# Patient Record
Sex: Female | Born: 1935 | Race: White | Hispanic: No | Marital: Married | Smoking: Never smoker
Health system: Southern US, Community
[De-identification: ages and names within clinical notes are randomized; demographics above are authoritative.]

## PROBLEM LIST (undated history)

## (undated) DIAGNOSIS — I10 Essential (primary) hypertension: Secondary | ICD-10-CM

## (undated) DIAGNOSIS — M1990 Unspecified osteoarthritis, unspecified site: Secondary | ICD-10-CM

## (undated) DIAGNOSIS — R011 Cardiac murmur, unspecified: Secondary | ICD-10-CM

## (undated) DIAGNOSIS — L719 Rosacea, unspecified: Secondary | ICD-10-CM

## (undated) DIAGNOSIS — D751 Secondary polycythemia: Secondary | ICD-10-CM

## (undated) DIAGNOSIS — G459 Transient cerebral ischemic attack, unspecified: Secondary | ICD-10-CM

## (undated) DIAGNOSIS — G250 Essential tremor: Secondary | ICD-10-CM

## (undated) DIAGNOSIS — K5792 Diverticulitis of intestine, part unspecified, without perforation or abscess without bleeding: Secondary | ICD-10-CM

## (undated) DIAGNOSIS — C50919 Malignant neoplasm of unspecified site of unspecified female breast: Secondary | ICD-10-CM

## (undated) DIAGNOSIS — I2510 Atherosclerotic heart disease of native coronary artery without angina pectoris: Secondary | ICD-10-CM

## (undated) HISTORY — DX: Unspecified osteoarthritis, unspecified site: M19.90

## (undated) HISTORY — DX: Diverticulitis of intestine, part unspecified, without perforation or abscess without bleeding: K57.92

## (undated) HISTORY — DX: Atherosclerotic heart disease of native coronary artery without angina pectoris: I25.10

## (undated) HISTORY — DX: Transient cerebral ischemic attack, unspecified: G45.9

## (undated) HISTORY — DX: Rosacea, unspecified: L71.9

## (undated) HISTORY — DX: Secondary polycythemia: D75.1

## (undated) HISTORY — DX: Malignant neoplasm of unspecified site of unspecified female breast: C50.919

## (undated) HISTORY — DX: Cardiac murmur, unspecified: R01.1

## (undated) HISTORY — DX: Essential tremor: G25.0

## (undated) HISTORY — DX: Essential (primary) hypertension: I10

---

## 1998-03-02 ENCOUNTER — Ambulatory Visit (HOSPITAL_COMMUNITY): Admission: RE | Admit: 1998-03-02 | Discharge: 1998-03-02 | Payer: Self-pay | Admitting: Neurosurgery

## 1998-03-19 ENCOUNTER — Ambulatory Visit (HOSPITAL_BASED_OUTPATIENT_CLINIC_OR_DEPARTMENT_OTHER): Admission: RE | Admit: 1998-03-19 | Discharge: 1998-03-19 | Payer: Self-pay | Admitting: Orthopaedic Surgery

## 1998-04-13 ENCOUNTER — Encounter: Admission: RE | Admit: 1998-04-13 | Discharge: 1998-07-12 | Payer: Self-pay | Admitting: Orthopaedic Surgery

## 1998-07-15 ENCOUNTER — Encounter: Admission: RE | Admit: 1998-07-15 | Discharge: 1998-10-13 | Payer: Self-pay

## 1998-10-19 ENCOUNTER — Encounter: Payer: Self-pay | Admitting: Neurosurgery

## 1998-10-21 ENCOUNTER — Ambulatory Visit (HOSPITAL_COMMUNITY): Admission: RE | Admit: 1998-10-21 | Discharge: 1998-10-21 | Payer: Self-pay | Admitting: Neurosurgery

## 1999-12-22 ENCOUNTER — Encounter: Admission: RE | Admit: 1999-12-22 | Discharge: 2000-03-21 | Payer: Self-pay | Admitting: Cardiology

## 2000-02-24 ENCOUNTER — Ambulatory Visit (HOSPITAL_BASED_OUTPATIENT_CLINIC_OR_DEPARTMENT_OTHER): Admission: RE | Admit: 2000-02-24 | Discharge: 2000-02-24 | Payer: Self-pay | Admitting: Orthopedic Surgery

## 2000-10-18 ENCOUNTER — Other Ambulatory Visit: Admission: RE | Admit: 2000-10-18 | Discharge: 2000-10-18 | Payer: Self-pay | Admitting: Radiology

## 2000-11-02 ENCOUNTER — Encounter (INDEPENDENT_AMBULATORY_CARE_PROVIDER_SITE_OTHER): Payer: Self-pay | Admitting: *Deleted

## 2000-11-02 ENCOUNTER — Encounter: Payer: Self-pay | Admitting: General Surgery

## 2000-11-02 ENCOUNTER — Ambulatory Visit (HOSPITAL_COMMUNITY): Admission: RE | Admit: 2000-11-02 | Discharge: 2000-11-03 | Payer: Self-pay | Admitting: General Surgery

## 2000-11-02 HISTORY — PX: MASTECTOMY PARTIAL / LUMPECTOMY: SUR851

## 2000-11-13 ENCOUNTER — Encounter: Admission: RE | Admit: 2000-11-13 | Discharge: 2001-02-11 | Payer: Self-pay | Admitting: Radiation Oncology

## 2000-12-13 ENCOUNTER — Ambulatory Visit (HOSPITAL_COMMUNITY): Admission: RE | Admit: 2000-12-13 | Discharge: 2000-12-13 | Payer: Self-pay | Admitting: Endocrinology

## 2000-12-13 ENCOUNTER — Encounter: Payer: Self-pay | Admitting: Endocrinology

## 2001-02-12 ENCOUNTER — Encounter: Admission: RE | Admit: 2001-02-12 | Discharge: 2001-05-13 | Payer: Self-pay | Admitting: Radiation Oncology

## 2001-06-20 ENCOUNTER — Ambulatory Visit (HOSPITAL_COMMUNITY): Admission: RE | Admit: 2001-06-20 | Discharge: 2001-06-20 | Payer: Self-pay | Admitting: Gastroenterology

## 2001-06-20 ENCOUNTER — Encounter (INDEPENDENT_AMBULATORY_CARE_PROVIDER_SITE_OTHER): Payer: Self-pay | Admitting: Specialist

## 2001-08-15 ENCOUNTER — Emergency Department (HOSPITAL_COMMUNITY): Admission: EM | Admit: 2001-08-15 | Discharge: 2001-08-15 | Payer: Self-pay

## 2001-10-15 ENCOUNTER — Encounter: Payer: Self-pay | Admitting: Orthopedic Surgery

## 2001-10-15 ENCOUNTER — Encounter: Admission: RE | Admit: 2001-10-15 | Discharge: 2001-10-15 | Payer: Self-pay | Admitting: Orthopedic Surgery

## 2001-10-16 ENCOUNTER — Ambulatory Visit (HOSPITAL_BASED_OUTPATIENT_CLINIC_OR_DEPARTMENT_OTHER): Admission: RE | Admit: 2001-10-16 | Discharge: 2001-10-16 | Payer: Self-pay | Admitting: Orthopedic Surgery

## 2001-10-16 ENCOUNTER — Encounter (INDEPENDENT_AMBULATORY_CARE_PROVIDER_SITE_OTHER): Payer: Self-pay | Admitting: Specialist

## 2003-07-09 ENCOUNTER — Encounter (HOSPITAL_BASED_OUTPATIENT_CLINIC_OR_DEPARTMENT_OTHER): Admission: RE | Admit: 2003-07-09 | Discharge: 2003-10-07 | Payer: Self-pay | Admitting: Internal Medicine

## 2003-12-25 ENCOUNTER — Encounter (HOSPITAL_BASED_OUTPATIENT_CLINIC_OR_DEPARTMENT_OTHER): Admission: RE | Admit: 2003-12-25 | Discharge: 2004-03-24 | Payer: Self-pay | Admitting: Internal Medicine

## 2003-12-31 ENCOUNTER — Ambulatory Visit (HOSPITAL_COMMUNITY): Admission: RE | Admit: 2003-12-31 | Discharge: 2003-12-31 | Payer: Self-pay | Admitting: *Deleted

## 2004-01-26 ENCOUNTER — Ambulatory Visit: Admission: EM | Admit: 2004-01-26 | Discharge: 2004-01-26 | Payer: Self-pay | Admitting: Orthopedic Surgery

## 2004-05-26 ENCOUNTER — Encounter (HOSPITAL_BASED_OUTPATIENT_CLINIC_OR_DEPARTMENT_OTHER): Admission: RE | Admit: 2004-05-26 | Discharge: 2004-08-24 | Payer: Self-pay | Admitting: Internal Medicine

## 2004-07-12 ENCOUNTER — Ambulatory Visit (HOSPITAL_COMMUNITY): Admission: RE | Admit: 2004-07-12 | Discharge: 2004-07-12 | Payer: Self-pay | Admitting: Gastroenterology

## 2004-07-12 ENCOUNTER — Encounter (INDEPENDENT_AMBULATORY_CARE_PROVIDER_SITE_OTHER): Payer: Self-pay | Admitting: Specialist

## 2004-09-01 ENCOUNTER — Encounter (HOSPITAL_BASED_OUTPATIENT_CLINIC_OR_DEPARTMENT_OTHER): Admission: RE | Admit: 2004-09-01 | Discharge: 2004-09-14 | Payer: Self-pay | Admitting: Internal Medicine

## 2004-11-18 ENCOUNTER — Encounter: Admission: RE | Admit: 2004-11-18 | Discharge: 2004-11-18 | Payer: Self-pay | Admitting: Orthopedic Surgery

## 2004-11-30 ENCOUNTER — Encounter (HOSPITAL_BASED_OUTPATIENT_CLINIC_OR_DEPARTMENT_OTHER): Admission: RE | Admit: 2004-11-30 | Discharge: 2004-12-20 | Payer: Self-pay | Admitting: Internal Medicine

## 2005-01-26 ENCOUNTER — Ambulatory Visit: Payer: Self-pay | Admitting: Oncology

## 2005-02-24 ENCOUNTER — Inpatient Hospital Stay (HOSPITAL_BASED_OUTPATIENT_CLINIC_OR_DEPARTMENT_OTHER): Admission: RE | Admit: 2005-02-24 | Discharge: 2005-02-24 | Payer: Self-pay | Admitting: *Deleted

## 2005-03-08 ENCOUNTER — Encounter (HOSPITAL_BASED_OUTPATIENT_CLINIC_OR_DEPARTMENT_OTHER): Admission: RE | Admit: 2005-03-08 | Discharge: 2005-06-06 | Payer: Self-pay | Admitting: Surgery

## 2005-05-17 ENCOUNTER — Observation Stay (HOSPITAL_COMMUNITY): Admission: EM | Admit: 2005-05-17 | Discharge: 2005-05-18 | Payer: Self-pay | Admitting: Emergency Medicine

## 2006-01-31 ENCOUNTER — Ambulatory Visit: Payer: Self-pay | Admitting: Oncology

## 2006-06-08 ENCOUNTER — Encounter: Admission: RE | Admit: 2006-06-08 | Discharge: 2006-06-08 | Payer: Self-pay | Admitting: Radiology

## 2006-09-01 ENCOUNTER — Ambulatory Visit (HOSPITAL_COMMUNITY): Admission: RE | Admit: 2006-09-01 | Discharge: 2006-09-01 | Payer: Self-pay | Admitting: Oncology

## 2006-12-14 ENCOUNTER — Ambulatory Visit (HOSPITAL_COMMUNITY): Admission: RE | Admit: 2006-12-14 | Discharge: 2006-12-14 | Payer: Self-pay | Admitting: Cardiology

## 2006-12-26 ENCOUNTER — Ambulatory Visit (HOSPITAL_COMMUNITY): Admission: RE | Admit: 2006-12-26 | Discharge: 2006-12-26 | Payer: Self-pay | Admitting: Cardiology

## 2007-01-24 ENCOUNTER — Ambulatory Visit: Payer: Self-pay | Admitting: Oncology

## 2007-01-26 ENCOUNTER — Ambulatory Visit: Payer: Self-pay | Admitting: Vascular Surgery

## 2007-01-26 LAB — CBC WITH DIFFERENTIAL/PLATELET
Basophils Absolute: 0.1 10*3/uL (ref 0.0–0.1)
Eosinophils Absolute: 0.2 10*3/uL (ref 0.0–0.5)
HCT: 43.5 % (ref 34.8–46.6)
HGB: 14.7 g/dL (ref 11.6–15.9)
MONO#: 0.7 10*3/uL (ref 0.1–0.9)
NEUT#: 2.9 10*3/uL (ref 1.5–6.5)
NEUT%: 54.2 % (ref 39.6–76.8)
RDW: 13 % (ref 11.3–14.5)
WBC: 5.4 10*3/uL (ref 3.9–10.0)
lymph#: 1.5 10*3/uL (ref 0.9–3.3)

## 2007-01-26 LAB — COMPREHENSIVE METABOLIC PANEL
ALT: 11 U/L (ref 0–35)
AST: 13 U/L (ref 0–37)
Albumin: 4.1 g/dL (ref 3.5–5.2)
BUN: 21 mg/dL (ref 6–23)
CO2: 23 mEq/L (ref 19–32)
Calcium: 8.8 mg/dL (ref 8.4–10.5)
Chloride: 109 mEq/L (ref 96–112)
Creatinine, Ser: 1.04 mg/dL (ref 0.40–1.20)
Potassium: 4.2 mEq/L (ref 3.5–5.3)

## 2007-01-26 LAB — LACTATE DEHYDROGENASE: LDH: 167 U/L (ref 94–250)

## 2007-09-06 ENCOUNTER — Encounter: Admission: RE | Admit: 2007-09-06 | Discharge: 2007-09-06 | Payer: Self-pay | Admitting: Orthopedic Surgery

## 2007-09-11 ENCOUNTER — Observation Stay (HOSPITAL_COMMUNITY): Admission: EM | Admit: 2007-09-11 | Discharge: 2007-09-12 | Payer: Self-pay | Admitting: Emergency Medicine

## 2007-11-30 ENCOUNTER — Encounter (INDEPENDENT_AMBULATORY_CARE_PROVIDER_SITE_OTHER): Payer: Self-pay | Admitting: *Deleted

## 2007-11-30 ENCOUNTER — Inpatient Hospital Stay (HOSPITAL_COMMUNITY): Admission: EM | Admit: 2007-11-30 | Discharge: 2007-12-02 | Payer: Self-pay | Admitting: Emergency Medicine

## 2007-11-30 ENCOUNTER — Ambulatory Visit: Payer: Self-pay | Admitting: Cardiology

## 2008-01-18 ENCOUNTER — Ambulatory Visit: Payer: Self-pay | Admitting: Oncology

## 2008-01-22 LAB — COMPREHENSIVE METABOLIC PANEL
ALT: 15 U/L (ref 0–35)
BUN: 21 mg/dL (ref 6–23)
CO2: 27 mEq/L (ref 19–32)
Creatinine, Ser: 0.84 mg/dL (ref 0.40–1.20)
Glucose, Bld: 185 mg/dL — ABNORMAL HIGH (ref 70–99)
Total Bilirubin: 0.6 mg/dL (ref 0.3–1.2)

## 2008-01-22 LAB — CBC WITH DIFFERENTIAL/PLATELET
BASO%: 0.4 % (ref 0.0–2.0)
HCT: 43.8 % (ref 34.8–46.6)
LYMPH%: 30.4 % (ref 14.0–48.0)
MCHC: 34.9 g/dL (ref 32.0–36.0)
MONO#: 0.8 10*3/uL (ref 0.1–0.9)
NEUT%: 52.4 % (ref 39.6–76.8)
Platelets: 179 10*3/uL (ref 145–400)
WBC: 6.2 10*3/uL (ref 3.9–10.0)

## 2008-01-22 LAB — LACTATE DEHYDROGENASE: LDH: 190 U/L (ref 94–250)

## 2008-03-03 ENCOUNTER — Encounter: Admission: RE | Admit: 2008-03-03 | Discharge: 2008-03-03 | Payer: Self-pay | Admitting: Radiology

## 2008-03-13 ENCOUNTER — Encounter: Admission: RE | Admit: 2008-03-13 | Discharge: 2008-03-13 | Payer: Self-pay | Admitting: Radiology

## 2008-03-13 ENCOUNTER — Encounter (INDEPENDENT_AMBULATORY_CARE_PROVIDER_SITE_OTHER): Payer: Self-pay | Admitting: Diagnostic Radiology

## 2008-04-05 ENCOUNTER — Encounter: Admission: RE | Admit: 2008-04-05 | Discharge: 2008-04-05 | Payer: Self-pay | Admitting: Orthopedic Surgery

## 2008-04-24 ENCOUNTER — Encounter: Admission: RE | Admit: 2008-04-24 | Discharge: 2008-04-24 | Payer: Self-pay | Admitting: Neurosurgery

## 2008-04-25 ENCOUNTER — Encounter: Admission: RE | Admit: 2008-04-25 | Discharge: 2008-04-25 | Payer: Self-pay | Admitting: Neurosurgery

## 2008-05-15 ENCOUNTER — Encounter (INDEPENDENT_AMBULATORY_CARE_PROVIDER_SITE_OTHER): Payer: Self-pay | Admitting: Surgery

## 2008-05-15 ENCOUNTER — Ambulatory Visit (HOSPITAL_BASED_OUTPATIENT_CLINIC_OR_DEPARTMENT_OTHER): Admission: RE | Admit: 2008-05-15 | Discharge: 2008-05-15 | Payer: Self-pay | Admitting: Surgery

## 2008-05-15 HISTORY — PX: MASTECTOMY PARTIAL / LUMPECTOMY: SUR851

## 2008-07-11 ENCOUNTER — Ambulatory Visit: Payer: Self-pay | Admitting: Oncology

## 2008-07-15 LAB — CBC WITH DIFFERENTIAL/PLATELET
BASO%: 0.6 % (ref 0.0–2.0)
LYMPH%: 34 % (ref 14.0–48.0)
MCHC: 33.7 g/dL (ref 32.0–36.0)
MCV: 102.9 fL — ABNORMAL HIGH (ref 81.0–101.0)
MONO%: 12.6 % (ref 0.0–13.0)
Platelets: 171 10*3/uL (ref 145–400)
RBC: 4.48 10*6/uL (ref 3.70–5.32)

## 2008-07-15 LAB — COMPREHENSIVE METABOLIC PANEL
ALT: 23 U/L (ref 0–35)
Alkaline Phosphatase: 54 U/L (ref 39–117)
Sodium: 138 mEq/L (ref 135–145)
Total Bilirubin: 0.4 mg/dL (ref 0.3–1.2)
Total Protein: 6.7 g/dL (ref 6.0–8.3)

## 2008-07-16 ENCOUNTER — Inpatient Hospital Stay (HOSPITAL_COMMUNITY): Admission: RE | Admit: 2008-07-16 | Discharge: 2008-07-18 | Payer: Self-pay | Admitting: Neurosurgery

## 2008-08-28 ENCOUNTER — Ambulatory Visit: Payer: Self-pay | Admitting: Surgery

## 2008-10-01 ENCOUNTER — Ambulatory Visit: Payer: Self-pay | Admitting: Oncology

## 2008-10-03 LAB — CBC WITH DIFFERENTIAL/PLATELET
EOS%: 3.8 % (ref 0.0–7.0)
Eosinophils Absolute: 0.3 10*3/uL (ref 0.0–0.5)
MCV: 102.7 fL — ABNORMAL HIGH (ref 81.0–101.0)
MONO%: 12.3 % (ref 0.0–13.0)
NEUT#: 3.6 10*3/uL (ref 1.5–6.5)
RBC: 4.28 10*6/uL (ref 3.70–5.32)
RDW: 13.6 % (ref 11.3–14.5)
lymph#: 2.1 10*3/uL (ref 0.9–3.3)

## 2008-10-03 LAB — COMPREHENSIVE METABOLIC PANEL
AST: 17 U/L (ref 0–37)
Albumin: 4.1 g/dL (ref 3.5–5.2)
Alkaline Phosphatase: 50 U/L (ref 39–117)
Glucose, Bld: 88 mg/dL (ref 70–99)
Potassium: 4.3 mEq/L (ref 3.5–5.3)
Sodium: 141 mEq/L (ref 135–145)
Total Protein: 6.4 g/dL (ref 6.0–8.3)

## 2008-11-14 DIAGNOSIS — G459 Transient cerebral ischemic attack, unspecified: Secondary | ICD-10-CM

## 2008-11-14 HISTORY — DX: Transient cerebral ischemic attack, unspecified: G45.9

## 2009-01-02 ENCOUNTER — Ambulatory Visit: Payer: Self-pay | Admitting: Oncology

## 2009-01-06 LAB — CBC WITH DIFFERENTIAL/PLATELET
Basophils Absolute: 0 10*3/uL (ref 0.0–0.1)
Eosinophils Absolute: 0.3 10*3/uL (ref 0.0–0.5)
HCT: 45.7 % (ref 34.8–46.6)
HGB: 15.7 g/dL (ref 11.6–15.9)
MONO#: 0.7 10*3/uL (ref 0.1–0.9)
NEUT#: 3.3 10*3/uL (ref 1.5–6.5)
NEUT%: 50.7 % (ref 38.4–76.8)
RDW: 12.5 % (ref 11.2–14.5)
lymph#: 2.2 10*3/uL (ref 0.9–3.3)

## 2009-01-06 LAB — LACTATE DEHYDROGENASE: LDH: 185 U/L (ref 94–250)

## 2009-01-06 LAB — COMPREHENSIVE METABOLIC PANEL
AST: 15 U/L (ref 0–37)
Albumin: 4.3 g/dL (ref 3.5–5.2)
BUN: 22 mg/dL (ref 6–23)
CO2: 26 mEq/L (ref 19–32)
Calcium: 9.3 mg/dL (ref 8.4–10.5)
Chloride: 107 mEq/L (ref 96–112)
Glucose, Bld: 81 mg/dL (ref 70–99)
Potassium: 4.8 mEq/L (ref 3.5–5.3)

## 2009-04-01 ENCOUNTER — Ambulatory Visit: Payer: Self-pay | Admitting: Oncology

## 2009-04-09 ENCOUNTER — Inpatient Hospital Stay (HOSPITAL_COMMUNITY): Admission: EM | Admit: 2009-04-09 | Discharge: 2009-04-09 | Payer: Self-pay | Admitting: Emergency Medicine

## 2009-05-20 ENCOUNTER — Inpatient Hospital Stay (HOSPITAL_COMMUNITY): Admission: EM | Admit: 2009-05-20 | Discharge: 2009-05-22 | Payer: Self-pay | Admitting: Emergency Medicine

## 2009-05-21 ENCOUNTER — Encounter (INDEPENDENT_AMBULATORY_CARE_PROVIDER_SITE_OTHER): Payer: Self-pay | Admitting: Internal Medicine

## 2009-05-21 ENCOUNTER — Ambulatory Visit: Payer: Self-pay | Admitting: Surgery

## 2009-05-21 ENCOUNTER — Encounter (INDEPENDENT_AMBULATORY_CARE_PROVIDER_SITE_OTHER): Payer: Self-pay | Admitting: Emergency Medicine

## 2009-09-30 ENCOUNTER — Ambulatory Visit: Payer: Self-pay | Admitting: Oncology

## 2009-10-02 LAB — COMPREHENSIVE METABOLIC PANEL
BUN: 21 mg/dL (ref 6–23)
CO2: 31 mEq/L (ref 19–32)
Calcium: 9.6 mg/dL (ref 8.4–10.5)
Chloride: 103 mEq/L (ref 96–112)
Creatinine, Ser: 0.88 mg/dL (ref 0.40–1.20)
Total Bilirubin: 0.4 mg/dL (ref 0.3–1.2)

## 2009-10-02 LAB — CBC WITH DIFFERENTIAL/PLATELET
BASO%: 0.5 % (ref 0.0–2.0)
Basophils Absolute: 0 10*3/uL (ref 0.0–0.1)
EOS%: 4 % (ref 0.0–7.0)
HCT: 44.8 % (ref 34.8–46.6)
HGB: 15.3 g/dL (ref 11.6–15.9)
LYMPH%: 33.5 % (ref 14.0–49.7)
MCH: 35.6 pg — ABNORMAL HIGH (ref 25.1–34.0)
MCHC: 34.1 g/dL (ref 31.5–36.0)
MONO#: 0.7 10*3/uL (ref 0.1–0.9)
NEUT%: 50.4 % (ref 38.4–76.8)
Platelets: 163 10*3/uL (ref 145–400)
lymph#: 2.1 10*3/uL (ref 0.9–3.3)

## 2009-10-02 LAB — LACTATE DEHYDROGENASE: LDH: 178 U/L (ref 94–250)

## 2009-10-16 ENCOUNTER — Encounter: Admission: RE | Admit: 2009-10-16 | Discharge: 2009-10-16 | Payer: Self-pay | Admitting: Oncology

## 2009-12-16 ENCOUNTER — Ambulatory Visit: Payer: Self-pay | Admitting: Oncology

## 2009-12-18 LAB — CBC WITH DIFFERENTIAL/PLATELET
EOS%: 3.5 % (ref 0.0–7.0)
MCH: 34.4 pg — ABNORMAL HIGH (ref 25.1–34.0)
MCV: 100.6 fL (ref 79.5–101.0)
MONO%: 10 % (ref 0.0–14.0)
NEUT#: 4.5 10*3/uL (ref 1.5–6.5)
RBC: 4.49 10*6/uL (ref 3.70–5.45)
RDW: 12.2 % (ref 11.2–14.5)
lymph#: 1.7 10*3/uL (ref 0.9–3.3)

## 2009-12-18 LAB — COMPREHENSIVE METABOLIC PANEL
ALT: 14 U/L (ref 0–35)
AST: 19 U/L (ref 0–37)
Albumin: 3.9 g/dL (ref 3.5–5.2)
Alkaline Phosphatase: 66 U/L (ref 39–117)
Chloride: 105 mEq/L (ref 96–112)
Potassium: 4.8 mEq/L (ref 3.5–5.3)
Sodium: 139 mEq/L (ref 135–145)
Total Protein: 6.7 g/dL (ref 6.0–8.3)

## 2009-12-22 ENCOUNTER — Emergency Department (HOSPITAL_COMMUNITY): Admission: EM | Admit: 2009-12-22 | Discharge: 2009-12-22 | Payer: Self-pay | Admitting: Emergency Medicine

## 2010-01-15 ENCOUNTER — Encounter: Admission: RE | Admit: 2010-01-15 | Discharge: 2010-01-15 | Payer: Self-pay | Admitting: Surgery

## 2010-01-19 ENCOUNTER — Ambulatory Visit (HOSPITAL_BASED_OUTPATIENT_CLINIC_OR_DEPARTMENT_OTHER): Admission: RE | Admit: 2010-01-19 | Discharge: 2010-01-19 | Payer: Self-pay | Admitting: Surgery

## 2010-01-19 HISTORY — PX: BREAST LUMPECTOMY: SHX2

## 2010-02-10 ENCOUNTER — Ambulatory Visit: Admission: RE | Admit: 2010-02-10 | Discharge: 2010-04-26 | Payer: Self-pay | Admitting: Radiation Oncology

## 2010-03-31 ENCOUNTER — Encounter (INDEPENDENT_AMBULATORY_CARE_PROVIDER_SITE_OTHER): Payer: Self-pay | Admitting: Surgery

## 2010-03-31 ENCOUNTER — Inpatient Hospital Stay (HOSPITAL_COMMUNITY): Admission: RE | Admit: 2010-03-31 | Discharge: 2010-04-03 | Payer: Self-pay | Admitting: Surgery

## 2010-03-31 HISTORY — PX: MASTECTOMY: SHX3

## 2010-04-01 ENCOUNTER — Ambulatory Visit: Payer: Self-pay | Admitting: Oncology

## 2010-05-17 ENCOUNTER — Inpatient Hospital Stay (HOSPITAL_COMMUNITY)
Admission: EM | Admit: 2010-05-17 | Discharge: 2010-05-19 | Payer: Self-pay | Source: Home / Self Care | Admitting: Emergency Medicine

## 2010-05-18 ENCOUNTER — Ambulatory Visit: Payer: Self-pay | Admitting: Internal Medicine

## 2010-06-23 ENCOUNTER — Ambulatory Visit: Payer: Self-pay | Admitting: Oncology

## 2010-06-25 ENCOUNTER — Encounter (HOSPITAL_COMMUNITY): Admission: RE | Admit: 2010-06-25 | Discharge: 2010-06-25 | Payer: Self-pay | Admitting: Neurosurgery

## 2010-08-25 ENCOUNTER — Ambulatory Visit: Payer: Self-pay | Admitting: Oncology

## 2010-09-17 ENCOUNTER — Encounter: Admission: RE | Admit: 2010-09-17 | Discharge: 2010-09-17 | Payer: Self-pay | Admitting: Neurosurgery

## 2010-09-24 ENCOUNTER — Ambulatory Visit: Payer: Self-pay | Admitting: Oncology

## 2010-10-21 ENCOUNTER — Inpatient Hospital Stay (HOSPITAL_COMMUNITY)
Admission: EM | Admit: 2010-10-21 | Discharge: 2010-10-22 | Payer: Self-pay | Source: Home / Self Care | Attending: Cardiology | Admitting: Cardiology

## 2010-10-25 ENCOUNTER — Ambulatory Visit: Payer: Self-pay | Admitting: Cardiology

## 2010-12-04 ENCOUNTER — Encounter: Payer: Self-pay | Admitting: Neurosurgery

## 2010-12-05 ENCOUNTER — Encounter: Payer: Self-pay | Admitting: Neurosurgery

## 2010-12-05 ENCOUNTER — Encounter: Payer: Self-pay | Admitting: Oncology

## 2010-12-05 ENCOUNTER — Encounter: Payer: Self-pay | Admitting: Orthopedic Surgery

## 2011-01-24 LAB — BASIC METABOLIC PANEL
BUN: 17 mg/dL (ref 6–23)
CO2: 29 mEq/L (ref 19–32)
Chloride: 99 mEq/L (ref 96–112)
Creatinine, Ser: 0.7 mg/dL (ref 0.4–1.2)
GFR calc Af Amer: >60 mL/min (ref >60)
Glucose, Bld: 227 mg/dL — ABNORMAL HIGH (ref 70–99)

## 2011-01-24 LAB — CBC
HCT: 45.7 % (ref 36.0–46.0)
MCH: 32.9 pg (ref 26.0–34.0)
MCV: 97.4 fL (ref 78.0–100.0)
MCV: 97.7 fL (ref 78.0–100.0)
Platelets: 172 10*3/uL (ref 150–400)
RBC: 4.69 MIL/uL (ref 3.87–5.11)
RBC: 4.8 MIL/uL (ref 3.87–5.11)
WBC: 4.9 10*3/uL (ref 4.0–10.5)

## 2011-01-24 LAB — URINALYSIS, ROUTINE W REFLEX MICROSCOPIC
Bilirubin Urine: NEGATIVE
Glucose, UA: 500 mg/dL — AB
Hgb urine dipstick: NEGATIVE
Ketones, ur: NEGATIVE mg/dL
Protein, ur: NEGATIVE mg/dL

## 2011-01-24 LAB — DIFFERENTIAL
Eosinophils Absolute: 0.2 10*3/uL (ref 0.0–0.7)
Eosinophils Relative: 4 % (ref 0–5)
Lymphocytes Relative: 43 % (ref 12–46)
Lymphs Abs: 1.7 10*3/uL (ref 0.7–4.0)
Lymphs Abs: 2.1 10*3/uL (ref 0.7–4.0)
Monocytes Absolute: 0.5 10*3/uL (ref 0.1–1.0)
Monocytes Relative: 8 % (ref 3–12)
Neutrophils Relative %: 62 % (ref 43–77)

## 2011-01-24 LAB — MRSA PCR SCREENING: MRSA by PCR: NEGATIVE

## 2011-01-24 LAB — GLUCOSE, CAPILLARY
Glucose-Capillary: 104 mg/dL — ABNORMAL HIGH (ref 70–99)
Glucose-Capillary: 189 mg/dL — ABNORMAL HIGH (ref 70–99)
Glucose-Capillary: 64 mg/dL — ABNORMAL LOW (ref 70–99)

## 2011-01-24 LAB — LIPID PANEL
Cholesterol: 100 mg/dL (ref 0–200)
HDL: 55 mg/dL (ref >39)
LDL Cholesterol: 29 mg/dL (ref 0–99)
Total CHOL/HDL Ratio: 1.8 RATIO

## 2011-01-24 LAB — PROTIME-INR: INR: 1.02 (ref 0.00–1.49)

## 2011-01-24 LAB — CK TOTAL AND CKMB (NOT AT ARMC): Relative Index: 2.9 — ABNORMAL HIGH (ref 0.0–2.5)

## 2011-01-24 LAB — APTT: aPTT: 32 seconds (ref 24–37)

## 2011-01-24 LAB — TROPONIN I: Troponin I: 0.02 ng/mL (ref 0.00–0.06)

## 2011-01-24 LAB — HEPARIN LEVEL (UNFRACTIONATED): Heparin Unfractionated: 0.29 IU/mL — ABNORMAL LOW (ref 0.30–0.70)

## 2011-01-24 LAB — POCT CARDIAC MARKERS: Myoglobin, poc: 131 ng/mL (ref 12–200)

## 2011-01-30 LAB — CBC
HCT: 38.7 % (ref 36.0–46.0)
HCT: 41.6 % (ref 36.0–46.0)
Hemoglobin: 14 g/dL (ref 12.0–15.0)
MCH: 34 pg (ref 26.0–34.0)
MCHC: 33.1 g/dL (ref 30.0–36.0)
MCHC: 33.6 g/dL (ref 30.0–36.0)
MCHC: 33.7 g/dL (ref 30.0–36.0)
MCV: 100.2 fL — ABNORMAL HIGH (ref 78.0–100.0)
MCV: 100.7 fL — ABNORMAL HIGH (ref 78.0–100.0)
Platelets: 150 10*3/uL (ref 150–400)
Platelets: 152 10*3/uL (ref 150–400)
RDW: 14.1 % (ref 11.5–15.5)
RDW: 14.3 % (ref 11.5–15.5)
WBC: 5.4 10*3/uL (ref 4.0–10.5)
WBC: 5.7 10*3/uL (ref 4.0–10.5)

## 2011-01-30 LAB — DIFFERENTIAL
Basophils Absolute: 0.1 10*3/uL (ref 0.0–0.1)
Basophils Relative: 0 % (ref 0–1)
Basophils Relative: 1 % (ref 0–1)
Eosinophils Absolute: 0.2 10*3/uL (ref 0.0–0.7)
Eosinophils Absolute: 0.2 10*3/uL (ref 0.0–0.7)
Eosinophils Relative: 3 % (ref 0–5)
Eosinophils Relative: 4 % (ref 0–5)
Lymphocytes Relative: 34 % (ref 12–46)
Monocytes Absolute: 0.9 10*3/uL (ref 0.1–1.0)
Monocytes Relative: 13 % — ABNORMAL HIGH (ref 3–12)

## 2011-01-30 LAB — LIPID PANEL
Cholesterol: 95 mg/dL (ref 0–200)
LDL Cholesterol: 37 mg/dL (ref 0–99)
Total CHOL/HDL Ratio: 1.9 RATIO
Triglycerides: 43 mg/dL (ref <150)
VLDL: 9 mg/dL (ref 0–40)

## 2011-01-30 LAB — COMPREHENSIVE METABOLIC PANEL
AST: 26 U/L (ref 0–37)
CO2: 29 mEq/L (ref 19–32)
Chloride: 103 mEq/L (ref 96–112)
Creatinine, Ser: 0.84 mg/dL (ref 0.4–1.2)
GFR calc Af Amer: >60 mL/min (ref >60)
GFR calc non Af Amer: >60 mL/min (ref >60)
Glucose, Bld: 112 mg/dL — ABNORMAL HIGH (ref 70–99)
Total Bilirubin: 0.7 mg/dL (ref 0.3–1.2)

## 2011-01-30 LAB — POCT CARDIAC MARKERS
CKMB, poc: 1.6 ng/mL (ref 1.0–8.0)
Myoglobin, poc: 77.6 ng/mL (ref 12–200)
Troponin i, poc: <0.05 ng/mL (ref 0.00–0.09)

## 2011-01-30 LAB — POCT I-STAT, CHEM 8
BUN: 26 mg/dL — ABNORMAL HIGH (ref 6–23)
Chloride: 102 mEq/L (ref 96–112)
Creatinine, Ser: 1.1 mg/dL (ref 0.4–1.2)
Potassium: 3.9 mEq/L (ref 3.5–5.1)
Sodium: 139 mEq/L (ref 135–145)

## 2011-01-30 LAB — TROPONIN I: Troponin I: 0.02 ng/mL (ref 0.00–0.06)

## 2011-01-30 LAB — GLUCOSE, CAPILLARY
Glucose-Capillary: 118 mg/dL — ABNORMAL HIGH (ref 70–99)
Glucose-Capillary: 82 mg/dL (ref 70–99)
Glucose-Capillary: 99 mg/dL (ref 70–99)

## 2011-01-30 LAB — HEMOGLOBIN A1C: Hgb A1c MFr Bld: 7.2 % — ABNORMAL HIGH (ref <5.7)

## 2011-01-30 LAB — CARDIAC PANEL(CRET KIN+CKTOT+MB+TROPI)
Total CK: 97 U/L (ref 7–177)
Troponin I: 0.01 ng/mL (ref 0.00–0.06)

## 2011-01-30 LAB — CK TOTAL AND CKMB (NOT AT ARMC): Total CK: 105 U/L (ref 7–177)

## 2011-01-30 LAB — BASIC METABOLIC PANEL
BUN: 20 mg/dL (ref 6–23)
BUN: 22 mg/dL (ref 6–23)
Chloride: 104 mEq/L (ref 96–112)
Creatinine, Ser: 0.84 mg/dL (ref 0.4–1.2)
Creatinine, Ser: 0.99 mg/dL (ref 0.4–1.2)
GFR calc non Af Amer: >60 mL/min (ref >60)
Glucose, Bld: 83 mg/dL (ref 70–99)
Glucose, Bld: 99 mg/dL (ref 70–99)

## 2011-01-30 LAB — MRSA PCR SCREENING: MRSA by PCR: NEGATIVE

## 2011-01-30 LAB — PROTIME-INR: Prothrombin Time: 12.9 seconds (ref 11.6–15.2)

## 2011-01-31 LAB — GLUCOSE, CAPILLARY
Glucose-Capillary: 162 mg/dL — ABNORMAL HIGH (ref 70–99)
Glucose-Capillary: 164 mg/dL — ABNORMAL HIGH (ref 70–99)
Glucose-Capillary: 221 mg/dL — ABNORMAL HIGH (ref 70–99)
Glucose-Capillary: 223 mg/dL — ABNORMAL HIGH (ref 70–99)
Glucose-Capillary: 237 mg/dL — ABNORMAL HIGH (ref 70–99)

## 2011-01-31 LAB — CARDIAC PANEL(CRET KIN+CKTOT+MB+TROPI)
CK, MB: 2.3 ng/mL (ref 0.3–4.0)
Relative Index: 1.3 (ref 0.0–2.5)

## 2011-02-01 LAB — CBC
MCHC: 33.2 g/dL (ref 30.0–36.0)
Platelets: 183 10*3/uL (ref 150–400)
RDW: 13.1 % (ref 11.5–15.5)

## 2011-02-01 LAB — URINALYSIS, ROUTINE W REFLEX MICROSCOPIC
Bilirubin Urine: NEGATIVE
Nitrite: NEGATIVE
Specific Gravity, Urine: 1.028 (ref 1.005–1.030)
Urobilinogen, UA: 0.2 mg/dL (ref 0.0–1.0)
pH: 5.5 (ref 5.0–8.0)

## 2011-02-01 LAB — DIFFERENTIAL
Basophils Absolute: 0 10*3/uL (ref 0.0–0.1)
Basophils Relative: 0 % (ref 0–1)
Lymphocytes Relative: 17 % (ref 12–46)
Monocytes Absolute: 0.8 10*3/uL (ref 0.1–1.0)
Neutro Abs: 6.1 10*3/uL (ref 1.7–7.7)
Neutrophils Relative %: 71 % (ref 43–77)

## 2011-02-01 LAB — BASIC METABOLIC PANEL
BUN: 15 mg/dL (ref 6–23)
CO2: 31 mEq/L (ref 19–32)
Calcium: 8.9 mg/dL (ref 8.4–10.5)
Creatinine, Ser: 0.86 mg/dL (ref 0.4–1.2)
GFR calc non Af Amer: >60 mL/min (ref >60)
Glucose, Bld: 214 mg/dL — ABNORMAL HIGH (ref 70–99)
Sodium: 140 mEq/L (ref 135–145)

## 2011-02-01 LAB — URINE MICROSCOPIC-ADD ON

## 2011-02-03 LAB — D-DIMER, QUANTITATIVE: D-Dimer, Quant: 0.71 ug/mL-FEU — ABNORMAL HIGH (ref 0.00–0.48)

## 2011-02-03 LAB — COMPREHENSIVE METABOLIC PANEL
AST: 22 U/L (ref 0–37)
CO2: 29 mEq/L (ref 19–32)
Calcium: 9.5 mg/dL (ref 8.4–10.5)
Creatinine, Ser: 0.85 mg/dL (ref 0.4–1.2)
GFR calc Af Amer: >60 mL/min (ref >60)
GFR calc non Af Amer: >60 mL/min (ref >60)
Glucose, Bld: 175 mg/dL — ABNORMAL HIGH (ref 70–99)
Total Protein: 7.2 g/dL (ref 6.0–8.3)

## 2011-02-03 LAB — CBC
MCHC: 34.4 g/dL (ref 30.0–36.0)
MCV: 100.8 fL — ABNORMAL HIGH (ref 78.0–100.0)
RBC: 4.53 MIL/uL (ref 3.87–5.11)
RDW: 12 % (ref 11.5–15.5)

## 2011-02-03 LAB — PROTIME-INR
INR: 1.03 (ref 0.00–1.49)
Prothrombin Time: 13.4 seconds (ref 11.6–15.2)

## 2011-02-03 LAB — DIFFERENTIAL
Lymphocytes Relative: 29 % (ref 12–46)
Lymphs Abs: 1.7 10*3/uL (ref 0.7–4.0)
Neutrophils Relative %: 55 % (ref 43–77)

## 2011-02-03 LAB — BRAIN NATRIURETIC PEPTIDE: Pro B Natriuretic peptide (BNP): 126 pg/mL — ABNORMAL HIGH (ref 0.0–100.0)

## 2011-02-06 LAB — BASIC METABOLIC PANEL
BUN: 18 mg/dL (ref 6–23)
CO2: 33 mEq/L — ABNORMAL HIGH (ref 19–32)
Chloride: 102 mEq/L (ref 96–112)
Glucose, Bld: 54 mg/dL — ABNORMAL LOW (ref 70–99)
Potassium: 4.4 mEq/L (ref 3.5–5.1)

## 2011-02-06 LAB — GLUCOSE, CAPILLARY: Glucose-Capillary: 132 mg/dL — ABNORMAL HIGH (ref 70–99)

## 2011-02-20 LAB — POCT CARDIAC MARKERS
CKMB, poc: <1 ng/mL — ABNORMAL LOW (ref 1.0–8.0)
Myoglobin, poc: 82 ng/mL (ref 12–200)
Troponin i, poc: <0.05 ng/mL (ref 0.00–0.09)
Troponin i, poc: <0.05 ng/mL (ref 0.00–0.09)

## 2011-02-20 LAB — URINALYSIS, ROUTINE W REFLEX MICROSCOPIC
Bilirubin Urine: NEGATIVE
Hgb urine dipstick: NEGATIVE
Ketones, ur: NEGATIVE mg/dL
Protein, ur: NEGATIVE mg/dL
Urobilinogen, UA: 0.2 mg/dL (ref 0.0–1.0)

## 2011-02-20 LAB — COMPREHENSIVE METABOLIC PANEL
AST: 27 U/L (ref 0–37)
CO2: 27 mEq/L (ref 19–32)
Chloride: 104 mEq/L (ref 96–112)
Creatinine, Ser: 0.81 mg/dL (ref 0.4–1.2)
GFR calc Af Amer: >60 mL/min (ref >60)
GFR calc non Af Amer: >60 mL/min (ref >60)
Total Bilirubin: 0.6 mg/dL (ref 0.3–1.2)

## 2011-02-20 LAB — CBC
HCT: 50.7 % — ABNORMAL HIGH (ref 36.0–46.0)
Hemoglobin: 16.5 g/dL — ABNORMAL HIGH (ref 12.0–15.0)
MCHC: 34.4 g/dL (ref 30.0–36.0)
MCV: 103.4 fL — ABNORMAL HIGH (ref 78.0–100.0)
MCV: 103.7 fL — ABNORMAL HIGH (ref 78.0–100.0)
MCV: 103.8 fL — ABNORMAL HIGH (ref 78.0–100.0)
Platelets: 152 10*3/uL (ref 150–400)
RBC: 4.61 MIL/uL (ref 3.87–5.11)
RBC: 4.64 MIL/uL (ref 3.87–5.11)
RBC: 4.89 MIL/uL (ref 3.87–5.11)
RDW: 13.3 % (ref 11.5–15.5)
WBC: 6.7 10*3/uL (ref 4.0–10.5)
WBC: 6.7 10*3/uL (ref 4.0–10.5)

## 2011-02-20 LAB — BASIC METABOLIC PANEL
BUN: 12 mg/dL (ref 6–23)
CO2: 27 mEq/L (ref 19–32)
Calcium: 9.1 mg/dL (ref 8.4–10.5)
Calcium: 9.2 mg/dL (ref 8.4–10.5)
Chloride: 101 mEq/L (ref 96–112)
Creatinine, Ser: 0.87 mg/dL (ref 0.4–1.2)
Creatinine, Ser: 0.95 mg/dL (ref 0.4–1.2)
GFR calc Af Amer: >60 mL/min (ref >60)
GFR calc Af Amer: >60 mL/min (ref >60)
Glucose, Bld: 169 mg/dL — ABNORMAL HIGH (ref 70–99)
Sodium: 135 mEq/L (ref 135–145)

## 2011-02-20 LAB — LIPID PANEL
Cholesterol: 126 mg/dL (ref 0–200)
HDL: 66 mg/dL (ref >39)
LDL Cholesterol: 44 mg/dL (ref 0–99)
Total CHOL/HDL Ratio: 1.9 RATIO
Triglycerides: 81 mg/dL (ref <150)

## 2011-02-20 LAB — URINE MICROSCOPIC-ADD ON

## 2011-02-20 LAB — CARDIAC PANEL(CRET KIN+CKTOT+MB+TROPI)
CK, MB: 2.2 ng/mL (ref 0.3–4.0)
CK, MB: 2.7 ng/mL (ref 0.3–4.0)
Relative Index: 2.1 (ref 0.0–2.5)
Relative Index: INVALID (ref 0.0–2.5)
Total CK: 83 U/L (ref 7–177)
Troponin I: 0.01 ng/mL (ref 0.00–0.06)
Troponin I: 0.02 ng/mL (ref 0.00–0.06)

## 2011-02-20 LAB — GLUCOSE, CAPILLARY
Glucose-Capillary: 114 mg/dL — ABNORMAL HIGH (ref 70–99)
Glucose-Capillary: 142 mg/dL — ABNORMAL HIGH (ref 70–99)
Glucose-Capillary: 231 mg/dL — ABNORMAL HIGH (ref 70–99)

## 2011-02-20 LAB — HEMOGLOBIN A1C: Mean Plasma Glucose: 128 mg/dL

## 2011-02-22 LAB — CBC
HCT: 45.4 % (ref 36.0–46.0)
MCHC: 34.9 g/dL (ref 30.0–36.0)
MCV: 101.6 fL — ABNORMAL HIGH (ref 78.0–100.0)
Platelets: 146 10*3/uL — ABNORMAL LOW (ref 150–400)
RBC: 4.15 MIL/uL (ref 3.87–5.11)
RDW: 12.9 % (ref 11.5–15.5)
RDW: 13.1 % (ref 11.5–15.5)

## 2011-02-22 LAB — COMPREHENSIVE METABOLIC PANEL
AST: 18 U/L (ref 0–37)
Albumin: 3.6 g/dL (ref 3.5–5.2)
BUN: 11 mg/dL (ref 6–23)
Calcium: 9.4 mg/dL (ref 8.4–10.5)
Creatinine, Ser: 0.75 mg/dL (ref 0.4–1.2)
GFR calc Af Amer: >60 mL/min (ref >60)
Total Bilirubin: 0.5 mg/dL (ref 0.3–1.2)
Total Protein: 6.9 g/dL (ref 6.0–8.3)

## 2011-02-22 LAB — CK TOTAL AND CKMB (NOT AT ARMC)
CK, MB: 1.7 ng/mL (ref 0.3–4.0)
CK, MB: 1.7 ng/mL (ref 0.3–4.0)
CK, MB: 1.8 ng/mL (ref 0.3–4.0)
Relative Index: 1.8 (ref 0.0–2.5)
Relative Index: INVALID (ref 0.0–2.5)
Relative Index: INVALID (ref 0.0–2.5)
Total CK: 101 U/L (ref 7–177)
Total CK: 72 U/L (ref 7–177)

## 2011-02-22 LAB — DIFFERENTIAL
Basophils Absolute: 0 10*3/uL (ref 0.0–0.1)
Eosinophils Relative: 4 % (ref 0–5)
Lymphocytes Relative: 35 % (ref 12–46)
Lymphs Abs: 1.9 10*3/uL (ref 0.7–4.0)
Monocytes Absolute: 0.6 10*3/uL (ref 0.1–1.0)
Monocytes Relative: 12 % (ref 3–12)
Neutro Abs: 2.6 10*3/uL (ref 1.7–7.7)

## 2011-02-22 LAB — POCT CARDIAC MARKERS
CKMB, poc: <1 ng/mL — ABNORMAL LOW (ref 1.0–8.0)
Troponin i, poc: <0.05 ng/mL (ref 0.00–0.09)

## 2011-02-22 LAB — TROPONIN I: Troponin I: 0.03 ng/mL (ref 0.00–0.06)

## 2011-02-22 LAB — BASIC METABOLIC PANEL
CO2: 30 mEq/L (ref 19–32)
Calcium: 9 mg/dL (ref 8.4–10.5)
Creatinine, Ser: 0.88 mg/dL (ref 0.4–1.2)
GFR calc Af Amer: >60 mL/min (ref >60)
GFR calc non Af Amer: >60 mL/min (ref >60)
Sodium: 141 mEq/L (ref 135–145)

## 2011-02-22 LAB — GLUCOSE, CAPILLARY
Glucose-Capillary: 115 mg/dL — ABNORMAL HIGH (ref 70–99)
Glucose-Capillary: 132 mg/dL — ABNORMAL HIGH (ref 70–99)

## 2011-03-18 ENCOUNTER — Encounter (INDEPENDENT_AMBULATORY_CARE_PROVIDER_SITE_OTHER): Payer: Self-pay | Admitting: Surgery

## 2011-03-29 NOTE — Op Note (Signed)
NAME:  [name], [name]                 ACCOUNT NO.:  [id number]   MEDICAL RECORD NO.:  [id number]          PATIENT TYPE:  AMB   LOCATION:  DSC                          FACILITY:  [facility]   PHYSICIAN:  Currie Paris, M.D.DATE OF BIRTH:  [birth year]   DATE OF PROCEDURE:  DATE OF DISCHARGE:                               OPERATIVE REPORT   PREOPERATIVE DIAGNOSIS:  Left breast calcifications, upper inner  quadrant.   POSTOPERATIVE DIAGNOSES:  Left breast calcifications, upper inner  quadrant.   OPERATION:  Needle-guided excision, left breast calcifications.   SURGEON:  Currie Paris, MD   ANESTHESIA:  General.   CLINICAL HISTORY:  This is a 77 year old lady with a prior right breast  cancer who has developed some suspicious looking calcifications on the  left side.  Previous attempts at core biopsies had been unsuccessful  both mammographic and MRI guided.  We elected therefore to do an  excisional biopsy with guidewire localization.   DESCRIPTION OF PROCEDURE:  The patient was seen in the holding area.  She had no further questions.  We identified and marked the left breast  as the operative side and I reviewed the mammogram localizing films with  Dr. Yolanda Bonine.  The guidewire was entered into the upper inner quadrant  and traveled inferiorly.  One of the guidewires was well past the area  in question.   The patient was taken to the operating room.  After satisfactory general  anesthesia had been obtained, the left breast was prepped and draped as  a sterile field.  A time-out was done.   I made a transverse incision several centimeters below the guidewire  entry site as this appeared to be the area, which I thought would be  directly over the questionable area.  I divided about a centimeter of  subcutaneous tissue and then elevated a superior flap until I could get  to where the guidewires entered and there were four wires place and all  four manipulated into the wound.   I then divided the breast tissue above  the guidewires down to the chest wall and then around both medially and  laterally keeping the guidewires in the center of this fairly wide  excision.  Once I got under it, I divided the breast tissue off of the  muscle, so that I had a nice deep margin.  Then with traction on the  superficial port of the specimen, I was able to pull it up into the  wound and divided it off of the subcutaneous tissues inferiorly and down  to where I was beyond all but the longest guidewire and took some extra  tissue, which was around the tip of the guidewire there.  She underwent  specimen mammography and the calcifications appeared to be slightly  distally located in the specimen but both clips were noted to be  present.   I went back at this point and took some additional margin, so that I was  clear around it at the area where the calcifications were thought to be  close, which was basically the inferior  portion of it with a little bit  medial and a little bit lateral.  Again, I had tissue excised basically  all the way down to the chest wall.   I made sure everything was dry.  Because of the patient's significant  allergies to local anesthetics, I placed no local anesthetic.  Incision  was closed in layers with 3-0 Vicryl, 4-0 Monocryl subcuticular, and  Dermabond.   The patient tolerated the procedure well and there were no  complications.  All counts were correct.      Currie Paris, M.D.  Electronically Signed     CJS/MEDQ  D:  05/15/2008  T:  05/16/2008  Job:  [id number]

## 2011-03-29 NOTE — Discharge Summary (Signed)
[name], [name]                 ACCOUNT NO.:  [id number]   MEDICAL RECORD NO.:  [id number]          PATIENT TYPE:  INP   LOCATION:  5736                         FACILITY:  [facility]   PHYSICIAN:  Tera Mater. Evlyn Kanner, M.D. DATE OF BIRTH:  26-Aug-1926   DATE OF ADMISSION:  09/11/2007  DATE OF DISCHARGE:  09/12/2007                               DISCHARGE SUMMARY   DISCHARGE DIAGNOSES ARE AS FOLLOWS:  1. Viral gastroenteritis with diarrhea and early ileus, clinically      improved.  2. Frequent diarrhea with dehydration, improved.  3. Trace Hemoccult-positive stools, no evidence of acute bleeding.  4. Possible hematemesis x1, resolved.  5. Type 2 diabetes.  6. Hypertension.  7. History of breast cancer.  8. Essential tremor.  9. Hyperlipidemia.   CONSULTATIONS:  Dr. Danise Edge with gastroenterology.   PROCEDURES:  None.   [name] is a 77 year old white female with a history of diverticular  disease, diabetes, breast cancer, hypertension who presented after 2-3  days of frequent diarrhea.  The stools turned dark, but not tarry, and  they were trace heme-positive.  There has been some Pepto-Bismol intake,  but this appeared to be after the first episode of dark stools.  She  also vomited with dark flecks.  She is brought into the emergency room  for these issues and was weak, dehydrated, and the question of GI  bleeding was entertained.   The patient was admitted for about 28 hours and clinically has done  quite well.  She was seen by gastroenterology who did not feel an acute  GI bleed was ongoing and did not feel we needed to do any workup at the  moment.  They felt there might be some component of her Aleve causing  the bleeding that was seen.  Fortunately, the patient has remained  hemodynamically stable, done well with no fever.  Her diarrhea has  resolved and she has had a slowly advanced diet which is now improved.  Blood sugars have been reasonable.  Overall, the patient  is clinically  much improved and is discharged home in improved condition.   Her vital signs this morning reveal a blood pressure of 121/54, pulse  63, respirations 18, temperature of 98.3, and O2 sat 98%.  Last blood  sugar was 165.  Lab data reveals a lipase that was normal at 14.  A BMET  in the afternoon yesterday:  Sodium 138, potassium 4.1, chloride 108,  CO2 24, BUN 16, creatinine 0.88, glucose 160, calcium 8.2.  A hemoglobin  and hematocrit were 15.6 with an MCV of 98.2, white count 5900,  platelets 195,000.  At presentation, initial hemoglobin was 17.5 with an  MCV of 97.4, platelets 226,000, white count 6.6.  Repeat 5 hours later  showed a white count of 6400, hemoglobin 15.9, MCV 96.8, platelets of  206,000.  Chemistries at presentation revealed a creatinine of 0.9, a  sodium was 138, potassium 4.2, chloride 111, glucose of 92 and BUN of  27.   RADIOLOGY TESTING:  She had an acute abdomen series which showed  scattered air  fluid levels within both the colon and small bowel seen in  association with enteritis, it was a nonobstructive bowel gas pattern.  There was history of cholecystectomy and cardiomegaly noted.   In summary, we have a 77 year old white female presenting with GI  symptoms that initially looked like it could be a GI bleed and  fortunately it turned out just to be gastroenteritis.  She is discharged  home after basically a 1-day stay in improved condition.   Her medications at discharge will include omission of her Aleve, and  addition of Prilosec OTC.  Additionally, she will resume her  medications, including Colestid 7.5 once a day, Diovan 160 once a day,  Lasix 60 once daily, potassium 20 mEq 5 tablets a day, Inderal LA 80 mg  a day, 70/30 mix by pen 18 in the morning 20 in the evening, Lovaza 2  grams daily, simvastatin 40 a day, Synthroid 75 mcg 7-1/2 pills a week,  Lunesta 3 at bedtime, Nitro-Dur patch 0.4 daily, Lyrica 50 once daily,  Flector patch  and the chondroitin she can continue.   She is to see me for a routine followup coming in the next several  weeks.  She is not due for another colonoscopy until 2010 per Dr.  Laural Benes, and does need a specific follow up with him.  She is to call if  she has any further problems.  Her diet is to be no concentrated sweets.  No pain management is necessary.           ______________________________  Tera Mater Evlyn Kanner, M.D.     SAS/MEDQ  D:  09/12/2007  T:  09/12/2007  Job:  [id number]

## 2011-03-29 NOTE — Discharge Summary (Signed)
NAMEKRISTIN, Bruce                 ACCOUNT NO.:  [id number]   MEDICAL RECORD NO.:  [id number]          PATIENT TYPE:  INP   LOCATION:  3706                         FACILITY:  [facility]   PHYSICIAN:  Kari Baars, M.D.  DATE OF BIRTH:  07-29-1926   DATE OF ADMISSION:  05/20/2009  DATE OF DISCHARGE:  05/22/2009                               DISCHARGE SUMMARY   DISCHARGE DIAGNOSES:  1. Transient ischemic attack with expressive aphasia, resolved.  2. Chronic angina, exacerbated by elevated blood pressure.  3. Hypertensive urgency.  4. Hyperlipidemia.  5. Type 2 diabetes, insulin-dependent with neuropathy.  6. Coronary artery disease with chronically occluded right coronary      artery with unsuccessful PCA (2006).  7. Central tremor.  8. Chronic back pain.  9. Rosacea.  10.Chronic venous stasis with a history of diabetic bolus.  11.History of breast cancer status post lumpectomy and radiation      therapy (2001).  12.Remote history of GI bleed (October 2008, resolved).   DISCHARGE MEDICATIONS:  1. Norvasc 5 mg daily (started).  2. Imdur 60 mg daily (increased dose).  3. Lasix 40 mg two daily.  4. KCl 20 mEq daily.  5. Diovan 160 mg daily.  6. Inderal 10 mg b.i.d.  7. 70/30 16 units twice a day.  8. Lovaza 1 capsule twice a day.  9. Synthroid 75 mcg daily and an extra one and a half on Saturday.  10.Zocor 40 mg daily.  11.Vitamin D 50,000 units weekly.  12.Vitamin D 1000 units daily.  13.Triamcinolone cream 0.1% b.i.d.  14.Darvocet p.r.n.  15.Colestid 5 g daily.  16.Glucosamine.  17.POINT study drug (Plavix versus placebo).   HOSPITAL PROCEDURES:  1. CT of the head without contrast (May 20, 2009), mild atrophy and      chronic microvascular ischemia but no acute abnormality.  2. MRI/MRA of the brain (May 20, 2009), no acute or reversible      process.  Atrophy and chronic small-vessel disease.  Normal      intracranial MR angiography of the large and medium sized  vessels.  3. Transthoracic echocardiogram (May 21, 2009) ejection fraction of 60-      65% with normal wall motion.  Grade 1 diastolic dysfunction.  Mild      mitral regurgitation.  No cardiac source of emboli identified.  4. Carotid duplex (May 21, 2009), no internal carotid artery stenosis      noted bilaterally.   HISTORY OF PRESENT ILLNESS:  For full details please see dictated  history and physical.  Briefly, [name] is an 77 year old white female  with a history of one vessel coronary artery disease with chronic  angina, hypertension, and type 2 diabetes who presented to the emergency  department with complaints of chest pain, headache, and speech changes.  This patient states that she was unable to sleep on the morning of  admission from 1:00 a.m. to 6:00 a.m. due to a severe occipital  headache.  She states that this was different than her prior headaches,  but did manage to fall asleep from 6:00 a.m. to  8:00 a.m.  When she  awoke, she was experiencing expressive aphasia and that she was saying  the wrong words, her husband noted.  She also developed chest pain  similar to her prior angina.  So, she checked her blood pressure and it  was 210/108.  Given this constellation of findings, she called EMS and  was brought to the emergency department where her blood pressure was  initially 135/105 but was labile throughout her stay in the emergency  department from 105-170/64-84.  Her speech deficits had resolved prior  to arrival in the emergency department and her chest pain resolved with  3 nitroglycerin.  EKG showed no acute findings.  Cardiac enzymes were  negative.  Head CT was performed that showed no acute intracranial  abnormalities.  However, given her angina and TIA symptoms, she was  admitted for further management.   HOSPITAL COURSE:  The patient was admitted to a telemetry bed.  Cardiology and Neurology consults were obtained on admission.  She  remained chest pain  free throughout the remainder of her  hospitalization.  Cardiac enzymes were negative for myocardial  infarction.  A transthoracic echocardiogram was performed that showed a  preserved ejection fraction of 60-65% with no regional wall motion  abnormalities.  No cardiac emboli was seen as well.   For her TIA, she underwent secondary stroke workup with an MRI/MRA of  the brain, carotid Dopplers, and echocardiogram with results as above.  Fasting lipid profile, A1c were in desirable ranges.  Her aspirin was  initially added.  Neurology recommended consideration of the POINT study  comparing aspirin to aspirin plus Plavix in post TIA patients for 3  months.  After thoughtful consideration and discussion with Dr.  Patty Sermons, Dr. Pearlean Brownie, and myself, the patient decided to proceed with  this study.  Of note, she does have a remote history of GI bleed and  this was initially of concern to her.  It appears that she was  previously on Plavix but this was discontinued due to bruising, not  bleeding.  Therefore, she will continue on the POINT study protocol  after discharge.  Her blood pressure remained moderately elevated  throughout the remainder of her hospitalization ranging from 133-155 on  the day prior to discharge.  Therefore, additional antihypertensive  agent will be added with Norvasc.  She does have a history of chronic  venous stasis and there is some concern about worsening edema.  This  will be monitored closely and she will continue on her other  antihypertensive regimen and statin therapy for medical management of  coronary artery disease and stroke prevention.  She will also continue  on her home dose of insulin as her A1c was 6.1 on admission indicating  excellent control.  At this point, the patient's secondary workup has  been completed and her symptoms have completely resolved.  She is back  to baseline and stable for discharge home.   DISCHARGE LABORATORY DATA:  CBC on the day  of discharge shows a white  count of 6.7, hemoglobin 16.5, platelets 157.  BMET significant for  sodium 135, potassium 3.6, chloride 101, bicarb 27, BUN 13, creatinine  0.9, glucose 169.  Cardiac enzymes negative x4 with a peak troponin of  0.02 and normal CKs.  Hemoglobin A1c is 6.1.  Sedimentation rate 8.  Fasting lipid profile shows a total cholesterol of 126, triglycerides  81, HDL 66, and LDL 44 on her home statin.  INR on admission was 1.0.  DISCHARGE DIET:  Cardiac prudent.   DISCHARGE INSTRUCTIONS:  She was instructed to call if she has  increasing chest pain, neurologic changes including speech changes, or  headache.  She should monitor her blood pressure and sugars, and call  for blood pressure if consistently above 140/90.  She should also call  if she has increased swelling in her legs.  I have encouraged her to  wear compression stockings to prevent edema.   HOSPITAL FOLLOWUP:  She will follow up with Dr. Evlyn Kanner in 2 weeks.  She  will follow up with Dr. Reyes Ivan and POINT study as directed.   DISPOSITION:  To home with her husband.      Kari Baars, M.D.  Electronically Signed     WS/MEDQ  D:  05/22/2009  T:  05/22/2009  Job:  [id number]   cc:   Jeannett Senior A. Evlyn Kanner, M.D.  Marlan Palau, M.D.  Elmore Guise., M.D.

## 2011-03-29 NOTE — H&P (Signed)
NAME:  [name], [name] NO.:  [id number]   MEDICAL RECORD NO.:  [id number]          PATIENT TYPE:  OBV   LOCATION:  3705                         FACILITY:  [facility]   PHYSICIAN:  Vesta Mixer, M.D. DATE OF BIRTH:  11-01-1926   DATE OF ADMISSION:  04/07/2009  DATE OF DISCHARGE:                              HISTORY & PHYSICAL   [name] is an elderly female with a history of coronary artery  disease.  She is admitted for observation after having several episodes  of chest pain today.   [name] has a long history of coronary artery disease.  She has a  chronic total occlusion of her right coronary artery.  This was  originally diagnosed in 2006.  Dr. Peter [place] tried an angioplasty of  this lesion in 2008, but was not able to pass a wire down the vessel.   She also has a history of breast cancer, hypertension, diabetes  mellitus, and dyslipidemia.  She has had lots of fatigue recently.  She  has just generally not been feeling well.  She typically walks about  1000 steps a day as counted on her pedometer.  Recently she has only  been able to go 50-100 steps a day.  She started having some episodes of  chest pain this morning.  The chest pain lasted for about an hour.  She  took nitroglycerin, but the chest pain returned several hours later.  She took a second nitroglycerin and then called EMS.  They gave her some  additional nitroglycerin and brought her to [facility].  She was pain  free when she arrived in the ER.  She denies any diaphoresis.  There is  no nausea or vomiting.  There is no PND or orthopnea.  She denies any  heat or cold intolerance, weight gain or weight loss.   CURRENT MEDICATIONS:  1. Colestid 5 mg a day.  2. Imdur 30 mg a day.  3. Diovan 160 mg a day.  4. Furosemide 40 mg twice a day.  5. Potassium chloride 20 mEq - 4 tablets a day.  6. NovoLog 70/30, 16 units in the morning and 16 units at night.  7. Lovaza 1 g twice a day.  8.  Simvastatin 40 mg a day.  9. Synthroid 75 mcg a day with 1-1/2 tablets once a week.  10.Trazodone 50 mg at bedtime as needed.  11.Propranolol as needed.  12.Flector patch 1.3% every 12 hours as needed.  13.Vitamin D 50,000 International Units once a week.   ALLERGIES:  She is allergic to multiple medications including LIDOCAINE,  XYLOCAINE, and NOVOCAIN.  She is also allergic to PENICILLIN, BEE  STINGS, ADHESIVE TAPE, and CELEBREX.  She apparently can take  Carbocaine.   PAST MEDICAL HISTORY:  1. Type 2 diabetes mellitus.  2. Hypertension.  3. History of coronary artery disease with a chronic total occlusion      of right coronary artery.  4. History of congestive heart failure.  5. Diverticulosis.   SOCIAL HISTORY:  The patient does not smoke and does not drink  alcohol.  She is a retired Engineer, site.   FAMILY HISTORY:  Positive for hypertension and stroke.   REVIEW OF SYSTEMS:  Listed in the HPI.  All other systems were reviewed  and are negative.   PHYSICAL EXAMINATION:  GENERAL:  She is an elderly female in no acute  distress.  She is currently pain free.  VITAL SIGNS:  Her blood pressure is 169/66.  Her heart rate is 62.  NECK:  Supple.  There is no JVD.  HEENT:  Her sclerae are nonicteric.  Her mucous membranes are moist.  LUNGS:  Clear.  HEART:  Regular rate, S1 and S2.  Her PMI is nondisplaced.  Her chest  wall is nontender.  ABDOMEN:  Good bowel sounds.  There is no hepatosplenomegaly.  There is  no guarding or rebound.  EXTREMITIES:  She has no clubbing, cyanosis.  She does have chronic  stasis changes and trace edema.  She has no palpable cords.  NEURO:  Nonfocal.   LABORATORY DATA:  Her cardiac enzymes are negative.  Her sodium is 141,  potassium is 3.8 chloride is 106, CO2 is 27, BUN is 11, and creatinine  0.75.  Her white blood cell count 5.3, hemoglobin is 15.7, hematocrit is  45%.  Her MCV is 101.6.   Her EKG reveals normal sinus rhythm.  She has a  left anterior hemiblock.  There is no ST or T-wave changes.   [name] presents with some episodes of chest pain.  These are fairly  atypical and her enzymes are negative so far.  We will admit her for  observation.  If her enzymes remain negative, then we certainly can  discharge her from the hospital tonight.  Dr. Reyes Ivan will be in to see  her tomorrow.  All of her other medical problems remain stable.      Vesta Mixer, M.D.  Electronically Signed     PJN/MEDQ  D:  04/07/2009  T:  04/08/2009  Job:  [id number]   cc:   Jeannett Senior A. Evlyn Kanner, M.D.  Elmore Guise., M.D.

## 2011-03-29 NOTE — Consult Note (Signed)
NAMEMarland Bruce  KAMRY, FARACI NO.:  [id number]   MEDICAL RECORD NO.:  [id number]          PATIENT TYPE:  INP   LOCATION:  5736                         FACILITY:  [facility]   PHYSICIAN:  Danise Edge, M.D.   DATE OF BIRTH:  1926/04/29   DATE OF CONSULTATION:  DATE OF DISCHARGE:                                 CONSULTATION   We are asked to see [name] today in consultation by Dr. Laurene Footman  for guaiac-positive stools and bloody diarrhea.   HISTORY OF PRESENT ILLNESS:  This is a 77 year old female who reports  diarrhea of 3 days duration.  Originally the diarrhea was occurring  every 15-20 minutes and was round.  It became black gradually.  Today  the  diarrhea has decreased in frequency it is occurring approximately  every 2 hours and is still somewhat dark.  It is guaiac positive.  She  has had an episode of epigastric pain this morning with some nausea and  vomited once.  She said it was watery with dark flecks.  She also tells  me she has loud abdominal noises.  She reports right lower quadrant pain  similar to what she has had before with her chronic diarrhea.  She  reports that she has a good appetite.  Her bowel movements were normal  before the diarrhea started earlier this week.  She has not taken any  recent antibiotics, no new medications, no sick contacts.  No recent  travel.  Her last endoscopic procedures were both an endoscopy and a  colon in 2005 by Dr. Danise Edge which showed extensive left-sided  diverticulosis.  She also has a history of colon polyps, hyperplastic.  On EGD her small bowel biopsies were normal.   PAST MEDICAL HISTORY:  Is significant for:  1. Carpal tunnel release surgery.  2. Right partial mastectomy.  3. Type 2 diabetes since 1998.  4. Degenerative joint disease.  5. Heart murmur.  6. Hyperlipidemia.  7. Coronary artery disease by cardiac cath.  8. Benign tremor.  9. Patellar surgery.  10.Cervical spine surgery.  11.Shoulder surgery.  12.Hand surgeries.  13.Cholecystectomy secondary to gallstones.  14.Chronic nonbloody diarrhea.  15.Hysterectomy.  16.Appendectomy.  17.Rosacea.  18.Hypothyroidism  19.Colonoscopy in August 2005 with colon biopsies which were normal.      She does have a history of hyperplastic colon polyps.  Upper      endoscopy in August 2005 with small bowel biopsies which were      normal.   CURRENT HOME MEDICATIONS:  Include Colestid, Diovan, Inderal, Synthroid,  Lunesta, Naprosyn, Lasix, potassium NovoLog, simvastatin, Nitro-Dur  patch, Lyrica,  Flector patch, Lovaza, glucosamine and chondroitin.   SOCIAL HISTORY:  This patient is a resident of [facility] [address].  She  lives  there with her husband.  She denies any alcohol or tobacco.   FAMILY HISTORY:  Is significant for her son who died at 65 years old  with rectal cancer.   REVIEW OF SYSTEMS:  The patient reports that she gets a cold all over  after she eats and she currently has  a broken left leg from falling out  of bed.   PHYSICAL EXAM:  She is alert and oriented in no apparent distress.  Her  temperature is 98.7.  Pulse is 81, respirations are 20, blood pressure  is 147/63.  Her heart has an irregular rhythm and murmur.  Her abdomen  is soft with active bowel sounds, tender to palpation in the right lower  quadrant.  She has bilateral lower extremity edema.   CURRENT LABS:  Show a hemoglobin of 15.9, hematocrit 46.8, white count  6.4, platelets 206,000.  BMP is within normal limits.  LFTs are within  normal limits.  Guaiac is positive.  Serum albumin is 2.7.  Her acute  abdominal x-ray shows scattered large and small bowel air-fluid levels  frequently seen with gastroenteritis.   ASSESSMENT:  Dr. Reece Agar has seen and examined the patient,  collected a history reviewed the chart.  His impression is that this is  a 77 year old female with multiple medical problems who is well known to  him.  He believes  that this is resolving viral gastroenteritis.   PLAN:  Discontinue Naprosyn,  use Tylenol, continue 81 mg aspirin.  No  endoscopic evaluation planned.  Discharge from hospital when medically  stable.   Thanks very much this consultation.      Stephani Police, PA    ______________________________  Danise Edge, M.D.    MLY/MEDQ  D:  09/11/2007  T:  09/11/2007  Job:  [id number]   cc:   Jeannett Senior A. Evlyn Kanner, M.D.  Danise Edge, M.D.

## 2011-03-29 NOTE — H&P (Signed)
NAME:  [name], [name]               ACCOUNT NO.:  [id number]   MEDICAL RECORD NO.:  [id number]           PATIENT TYPE:   LOCATION:                                 FACILITY:   PHYSICIAN:  Payton Doughty, M.D.           DATE OF BIRTH:   DATE OF ADMISSION:  07/16/2008  DATE OF DISCHARGE:                              HISTORY & PHYSICAL   ADMISSION DIAGNOSIS:  Foraminal disk on the right side at L4-5.   BODY OF TEXT:  This is a 77 year old right-handed white lady who has had  a lot of right leg pain.  She had an MRI that showed a disk at L4-5 in  the foraminal position.  She had negative breast biopsy, so she had an  epidural injection.  Now, the pain has returned, and she presents now  for foraminal diskectomy.   MEDICAL HISTORY:  Remarkable for diabetes, familial tremor,  hypertension, and aortic stenosis.   MEDICATIONS:  She is on insulin, antihypertensive medications, a  plethora of vitamins.   ALLERGIES:  She is allergic to XYLOCAINE, PENICILLIN, and LIDOCAINE.   FAMILY HISTORY:  Mother lived to be 41 and father lived to be 55, both  were in good health until they died.   SURGICAL HISTORY:  Breast biopsy, anterior cervical.   REVIEW OF SYSTEMS:  Remarkable for familial tremor, her back and leg  pain.  HEENT:  Within normal limits.  She has good range of motion of  the neck.  CHEST:  Clear.  CARDIAC:  Regular rate and rhythm.  ABDOMEN:  Nontender.  No hepatosplenomegaly.  EXTREMITIES:  Without clubbing or  cyanosis.  GU:  Deferred.  Peripheral pulses are good.  NEUROLOGIC:  She  is awake, alert, and oriented.  Cranial nerves are intact.  Motor exam  shows 5/5 strength throughout the upper extremities and right lower  extremity.  She has weakness of the knee extensors on the right side.  Knee jerk is absent on the right, 1 on the left, straight leg raise is  very positive on the right.   MRI shows a foraminal disk on the right side at L4-5.   PLAN:  Foraminal diskectomy on the  right side at L4-5.  Risks and  benefits have been discussed with her, and she wished to proceed.           ______________________________  Payton Doughty, M.D.     MWR/MEDQ  D:  07/16/2008  T:  07/17/2008  Job:  [phone]

## 2011-03-29 NOTE — Discharge Summary (Signed)
NAMEJAMAYIA, [name]                 ACCOUNT NO.:  [id number]   MEDICAL RECORD NO.:  [id number]          PATIENT TYPE:  INP   LOCATION:  3705                         FACILITY:  [facility]   PHYSICIAN:  Elmore Guise., M.D.DATE OF BIRTH:  [birth year]   DATE OF ADMISSION:  04/07/2009  DATE OF DISCHARGE:  04/09/2009                               DISCHARGE SUMMARY   DISCHARGE DIAGNOSES:  1. Hypertensive urgency.  2. Chest pain, likely secondary to hypertensive urgency.  3. History of single-vessel coronary artery disease.  4. Dyslipidemia.  5. Hypothyroidism.  6. Arthritis.  7. Diabetes mellitus.   HISTORY OF PRESENT ILLNESS:  [name] is a very pleasant 77 year old  white female with multiple medical problems, who presented for  evaluation of hypertensive urgency and chest discomfort.  She was  admitted for further evaluation and treatment.   HOSPITAL COURSE:  The patient's hospital course was uncomplicated.  We  increased her Imdur from 30 mg to 60 mg daily.  She had no further chest  pain.  However with this increase in Imdur, she did start having trouble  with headache.  Therefore, we decreased this down to 30 mg twice daily  taking once in the morning and once in the afternoon.  She has now been  up and ambulatory and doing well.  Her blood pressure has been improved,  initially on admission 190-200 over 90-100, during her hospitalization  ranging from 120-160, however, this 160 was when she was having  difficulty with headaches.  Her cardiac markers were all normal.  Her  ECG showed no significant changes.  She has been up and ambulatory  without any further problems.   She will be discharged home today to continue the following medications:  1. Imdur 30 mg twice daily.  2. Diovan 160 mg daily.  3. Lasix 40 mg 2 tablets daily.  4. Potassium 20 mEq twice daily.  5. Insulin 70/30, 16 units in the morning, 16 at night.  6. Lovaza 2 tablets daily.  7. Simvastatin 40 mg daily.  8. Synthroid 1 tablet daily plus 1-1/2 tablets once weekly.  9. Trazodone 50 mg 1 tablet at bedtime as needed.  10.Propranolol 10 mg 1 tablet twice daily.  11.Vitamin D 1 p.o. once a week.  12.Darvocet 2 tablets every 4-6 hours as needed for pain.  Her only medication that was changed was her Imdur increasing from once  daily to twice daily.   She will keep her scheduled office visit with me in June.  Otherwise,  she is to call the office if she has any further problems.  Her  medication list was reviewed with her in detail.      Elmore Guise., M.D.  Electronically Signed     TWK/MEDQ  D:  04/09/2009  T:  04/10/2009  Job:  [id number]

## 2011-03-29 NOTE — Discharge Summary (Signed)
[name], [name]                 ACCOUNT NO.:  [id number]   MEDICAL RECORD NO.:  [id number]          PATIENT TYPE:  INP   LOCATION:  3037                         FACILITY:  [facility]   PHYSICIAN:  Payton Doughty, M.D.      DATE OF BIRTH:  1925/11/17   DATE OF ADMISSION:  07/16/2008  DATE OF DISCHARGE:  07/18/2008                               DISCHARGE SUMMARY   ADMITTING DIAGNOSIS:  Herniated disc on the right side at L4-L5.   POSTOPERATIVE DIAGNOSES:  Herniated disc on the right side at L4-L5.   OPERATIVE PROCEDURE:  L4-L5 discectomy.   COMPLICATIONS:  None.   Discharge, satisfied and well.   BODY OF TEXT:  This is a 77 year old lady, whose history and physical is  recounted in the chart.  She basically had right-leg pain, had an  epidural to get a breast biopsy done.  CT showed a foraminal disc at 4-5  and she was admitted for discectomy.  Her medical history is remarkable  for diabetes, hypertension, aortic stenosis.   MEDICATIONS:  1. Colestid 5 g a day.  2. MiraLax 17 g a day.  3. Diovan 160 mg a day.  4. Klor-Con 40 mEq twice a day.  5. Bystolic 20 mg a day.  6. Nitro-Dur patch 0.4 mg an hour; it is on 12 hours and off 12 hours.  7. NovoLog 70/30 24 units in the morning and 20 units in the evening.  8. Lovasa 2 g a day.  9. Simvastatin 40 mg at bedtime.  10.Zyrtec D 5/120 on a p.r.n. basis.  11.Flector Patch 1.3% every 12 hours as needed.  12.Vitamin D 12.5 mg a day.  13.Darvocet N 100 one to two every 4-6 p.r.n.  14.Tamoxifen 20 mg a day.  15.Glucosamine 100 mg a day.  16.Multivitamin daily.  17.Beta carotene 15 mg a day.  18.Magnesium 200 mg a day.  19.Synthroid 75 mcg a day, then one and a half tabs one day a week.   SOCIAL HISTORY:  She does not smoke or drink.   HOSPITAL COURSE:  She was admitted after ascertaining normal laboratory  values and underwent a 4-5 discectomy.  Postoperatively, her leg pain is  markedly improved.  Her incision is dry and  well-healing.  She spent one  day mobilizing.  She has a little bit of leg pain.  She should use a  walker to walk with.  To confrontational testing, her strength is quite  good.  She is being discharged home to a friend's home for the assisted  living portion.   DISCHARGE MEDICATION:  Her discharge medications are exactly the same as  her admission medications.   FOLLOWUP:  Her followup will be via phone call to the [facility] offices  next week for suture removal.           ______________________________  Payton Doughty, M.D.     MWR/MEDQ  D:  07/18/2008  T:  07/18/2008  Job:  [id number]

## 2011-03-29 NOTE — H&P (Signed)
NAMEJENNESSY, [name]                 ACCOUNT NO.:  [id number]   MEDICAL RECORD NO.:  [id number]          PATIENT TYPE:  INP   LOCATION:  5736                         FACILITY:  [facility]   PHYSICIAN:  Tera Mater. [place], M.D. DATE OF BIRTH:  [birth year]   DATE OF ADMISSION:  09/11/2007  DATE OF DISCHARGE:                              HISTORY & PHYSICAL   [name] is a 77 year old white patient well-known to me with multiple  long-term medical conditions including type 2 diabetes, breast cancer,  neuropathy, benign tremor, hypertension.  Two to three days ago, she  developed some loose stools.  Initially, everything was fine, and then  she started having loose, watery stools.  Soon thereafter, she noted  they got significantly start but not tarry.  At some point during this,  she took at least a couple of doses of Pepto-Bismol.  She thinks the  Pepto-Bismol was after the first dark stool but could not be absolutely  certain of this.  She was switched over to Kaopectate.  She had one  episode of retching and nausea and vomiting.  She had a fullness in her  chest similar to prior esophageal spasm she had decades ago.  Her p.o.  intake has been poor, and she notes that she has had mostly toast and  liquids.  She has had quite a lot of gurgling sounds and out of bowel  sounds but really no significant abdominal pain.  She had no fevers or  chills or sweats, but her extremities did seem cold.  She was generally  weak and a bit dizzy.  She had no cardiac type chest pain other than  this fullness in her epigastrium.  Her breathing did well.  She had no  cough.  She continued on her under medications and had no significant  low blood sugars.  She has now received some fluids and antiemetics and  feels about 90% better.  She had one loose stool during the night, and  this was less dark.  She also saw some flecks of dark, no frank blood,  in the one vomitus that she had.  She has no abdominal pain at  the  present time.  She is lying perfectly flat without dyspnea.  She notes  no other aches or pains at the moment.   PAST MEDICAL HISTORY:  Includes longstanding history of type 2 diabetes.  She has hypertension.  She has __________ tremor.  She has  hyperlipidemia.  She has hypothyroidism.  She has had diverticulosis  with a colonoscopy about 2 years ago.  She had breast cancer in 2001  with a right partial mastectomy with a sentinel node biopsy.  In 2002,  she had brain surgery.  She has had a foot ulcer in the past that has  healed up.  She has had a cardiac workup in 2006 with a cardiac  catheterization that was abnormal.  She has one-vessel coronary artery  disease and has had this treated.  She has had some anxiety in the past.  She has had neuropathy.  She has had hyperlipidemia.  SOCIAL HISTORY:  Reveals she is married.  She has four children, who of  whom is deceased with colon cancer.   MEDICATION LIST:  Includes:  1. Colestid 7.5 mg once a day.  2. Diovan 160 once a day.  3. Lasix 60 mg once a day.  4. Potassium 20 mEq 5 tablets a day.  5. Inderal LA 80 mg once daily.  6. 70/30 mix pen 18 in the morning and 20 in the evening.  7. Lovaza 2 grams a day.  8. Simvastatin 40 mg a day.  9. Synthroid 75 mcg 7-1/2 pills a week.  10.Nitro-Dur patch 0.4 mg once daily.  11.Lunesta 3 mg at bedtime.  12.Lyrica 50 mg once a day.  13.Flector patch.  14.She also takes over-the-counter Naprosyn and chondroitin sulfate.   ALLERGIES:  LIDOCAINE, XYLOCAINE, NOVOCAIN, PENICILLIN, BEE STINGS,  CELEBREX AND ADHESIVE TAPE.   On exam here this morning, blood pressure is 147/63, pulse 81,  respirations 20, temperature 98.7, saturations 98%.  Blood sugar early  this morning was 100.  We have a fairly healthy white female lying  perfectly flat in no distress.  Sclerae anicteric.  Face is symmetric.  There is acne rosacea on her  face.  Oral mucous membranes are moist.  No oral pharyngeal  lesions  could be found.  No thyromegaly is present.  No bruits are heard.  LUNGS:  Clear with no wheezes, rales or rhonchi.  No accessory muscles  are in use.  HEART:  Is regular and somewhat distant with no murmurs appreciated.  ABDOMEN:  Is soft.  It is not significantly distended.  She does have  some hyperactive bowel sounds but no real rushes or tinkles.  No areas  of tenderness or masses or pulsations are present.  No enlargement of  her liver or spleen are felt.  EXTREMITIES:  Reveal pulses to be relatively well intact.  There is a  boot on the left ankle.  There is about 1+ edema.  Chronic venous stasis  changes are present.  The patient is awake, alert.  Mentation is perfectly good.  Speech is  clear.  Resting tremor is present to a moderate degree as per her  normal.   Her laboratory data reveals a sodium 142, potassium 4.7, chloride 111,  CO2 23, BUN 21, creatinine 0.97, glucose 115, total bilirubin is 0.7,  SGOT is 24, SGPT is 16, total protein 5.7, albumin 2.7, calcium 8.2.  This morning, her white count is 6400, hemoglobin 15.9, platelets  206,000.  At midnight, white count was 6600, hemoglobin 17.5, platelets  226,000.  Occult fecal blood x1 was positive.  ISTAT creatinine last  night was 0.9.  Radiology testing included an abdomen x-ray with  scattered air-fluid levels in the large and small bowel felt to be  enteritis.  There was prior cholecystectomy and evidence of  cardiomegaly.   In summary, we have a 77 year old white female presenting with a primary  diarrheal illness with some dark stools.  Interestingly, they can see  some bismuth on the abdominal film, and there is Pepto-Bismol intake  which may account for the darkness of the stools.  Certainly, there is  no brisk GI bleeding, and she is not anemic in anyway.  In fact, tends  towards the higher side.  I suspect the drop in hemoglobin is a  dilutional effect.  The patient is clinically dramatically better  this  morning.  We are going to advance the diet and watch carefully.  The one  episode of vomiting with some flecks of dark in it could represent Pepto-  Bismol as well.  I doubt there is a significant GI bleeding from above  at the present time.  She has had no prodromal symptoms, and this is  more consistent with a viral gastroenteritis.  I do not see any evidence  that this is related to diverticular disease directly.  Her blood sugars  are doing fine with no evidence of decompensation there, and there is no  evidence of worsening of her cardiac status.  We will get Reece Agar  just to take a look at her since he is the inpatient doctor this week  and see if he wants to do any outpatient or inpatient testing.  I am  going to reevaluate her later today and consider discharge if she is  continuing to look good.           ______________________________  Tera Mater Evlyn Kanner, M.D.     SAS/MEDQ  D:  09/11/2007  T:  09/11/2007  Job:  [id number]

## 2011-03-29 NOTE — H&P (Signed)
NAME:  [name], [name]                 ACCOUNT NO.:  [id number]   MEDICAL RECORD NO.:  [id number]          PATIENT TYPE:  OBV   LOCATION:  3706                         FACILITY:  [facility]   PHYSICIAN:  Kari Baars, M.D.  DATE OF BIRTH:  [birth year]   DATE OF ADMISSION:  05/20/2009  DATE OF DISCHARGE:                              HISTORY & PHYSICAL   CHIEF COMPLAINT:  Chest pain, headache, and speech changes.   HISTORY OF PRESENT ILLNESS:  Ms. Melcher is an 77 year old white female  with a history of coronary artery disease/chronic angina, hypertension,  and type 2 diabetes, who presented to the emergency department this  morning with complaint of chest pain, headache and speech changes.  The  patient states that she was in her usual state of health last evening  when she went to bed.  She was unable to sleep from about 1 a.m. to 6  a.m. this morning due to a severe occipital headache.  This was a  different type of headache than she has had in the past.  She managed  sleep from about 6 a.m. to 8 a.m., but when she awoke at 8 a.m., she has  developed some chest pain, which is similar to her prior episodes of  angina.  In addition, her husband noted that she was saying the wrong  words.  She says that she knew which word she was trying to say, but the  words came out differently.  She checked her blood pressure at that time  and it was 210/108.  Given these symptoms and markedly elevated blood  pressure, she called the EMS and was brought to the emergency  department, where her blood pressure was initially 135/105, but was very  labile throughout her course there, ranging from 105-170 over 64-84.  A  head CT was performed which was negative for acute intracranial  abnormalities.  Her chest pain resolved with nitroglycerin and she has  remained headache and chest pain free since admission.  Her speech  changes had resolved prior to her arrival in the emergency department,  she had no other  neurologic changes.  Therefore, t-PA was not  administered and Neurology was consulted.  She has since been evaluated  by Neurology, who have suggested enrolment in a POINT study consisting  of aspirin versus aspirin plus Plavix trial.  The patient is concerned  about her risk of GI bleed, given the remote history of peptic ulcer  disease and GI bleeding in October 2008.  She states that she was on  Plavix in the past, but this was discontinued by Dr. Reyes Ivan for a reason  that is unknown to her at this time.  She has had no prior history of  stroke or TIA and is not on aspirin at home due to history of peptic  ulcer disease.  No recent blood in her stools or dark tarry stools.   REVIEW OF SYSTEMS:  All systems reviewed with the patient are negative  except in the HPI.   PAST MEDICAL HISTORY:  1. She does have chronic back pain  for which she takes Darvocet.      Deemed nonoperative due to her chronic coronary artery disease.  2. Coronary artery disease with chronically occluded right coronary      artery.  Attempted percutaneous coronary angioplasty in 2006,      unsuccessful due to difficulty passing a wire.  Ejection fraction      is 65%.  3. Type 2 diabetes with neuropathy, insulin dependent.  4. Hypertension with repeated admissions for hypertensive urgency and      chest pain.  5. Hyperlipidemia.  6. Chronic back pain.  7. Essential tremor.  8. Regurgitation.  9. History of breast cancer, status post lumpectomy/radiation therapy      (2001).  10.History of GI bleed (October 2008).  No evaluation performed at      that time.  Last colonoscopy within the last 10 years.   CURRENT MEDICATIONS:  1. Imdur 30 mg t.i.d.  2. Lasix 40 mg daily.  3. KCl 20 mEq daily.  4. Diovan 160 mg daily.  5. Inderal 10 mg b.i.d.  6. 70/30 insulin 16 units b.i.d.  7. Lovaza 1 g b.i.d.  8. Synthroid 75 mcg with an extra one and half on Saturdays.  9. Zocor 40 mg daily.  10.Vitamin D 50,000 units  every Thursday.  11.Glucosamine.  12.Vitamin D.   ALLERGIES:  PENICILLIN, CELEBREX, LIDOCAINE, and PHENOBARBITAL.   SOCIAL HISTORY:  She is married for the past 60 years.  Resident of  [place].  No tobacco, alcohol, or drug use.   FAMILY HISTORY:  Positive for stroke.   PHYSICAL EXAMINATION:  VITAL SIGNS:  Temperature 98.2, blood pressure  166/81, pulse 88, respirations 20, and oxygen saturation 97% on room  air.  GENERAL:  A pleasant, well-educated female in no acute distress.  HEENT:  Oropharynx is moist.  NECK:  Supple without lymphadenopathy, JVD, or carotid bruits.  HEART:  Regular rate and rhythm with a grade 2/6 systolic ejection  murmur.  LUNGS:  Clear to auscultation bilaterally.  ABDOMEN:  Soft, nondistended, and nontender.  Normoactive bowel sounds.  EXTREMITIES:  No cyanosis, clubbing, or edema.  She does have chronic  venostasis changes.  NEUROLOGIC:  Alert and oriented x4.  Motor strength is 5/5 in all  extremities.  No focal deficits.   LABORATORY:  CBC shows white count is 6.7, hemoglobin is 17.2, platelets  164.  BMET is significant for sodium 139, potassium 4.2, chloride 104,  bicarb 27, BUN 14, creatinine 0.8, and glucose 111.  Liver function  tests are normal.  INR 1.0.  Urinalysis is 11-20 white blood cells.  Total cholesterol 126, LDL 44, HDL 66, triglycerides 81, Z6X 6.1.  Cardiac enzymes negative x2.   STUDIES:  1. EKG shows normal sinus rhythm with left axis deviation and poor R-      wave progression.  No significant change compared to prior EKGs.  2. CT of the head shows no acute intracranial abnormalities.  3. MRI/MRA shows no acute findings with normal MRA of the large and      medium size vessels.   ASSESSMENT/PLAN:  1. Transient ischemic attack - her expressive aphasia in the setting      of markedly elevated blood pressures consistent with transient      ischemic attack with complete resolution of symptoms within 30      minutes  to an hour.  I do appreciate Neurology management.  She      does not appear to want to  proceed with POINT study at this time      due to concern of GI bleeding risk.  I will proceed with secondary      stroke evaluation with carotid studies, echocardiogram, and MRA,      which has already been performed and continue with secondary      prevention with blood pressure control (initial blood pressure goal      140-160) in the setting of her TIA and statin therapy.  We will add      antiplatelet therapy with aspirin 325 mg daily for now.  2. Unstable angina - this is likely related to markedly elevated blood      pressure as well.  Cardiology consult is pending.  Anticipate      continued medical therapy in the setting of known coronary artery      disease and chronic angina.  We will add nitroglycerin topically      for now and transition back to the Imdur once a day.  3. Type 2 diabetes - we will change her 70/30 to Lantus b.i.d. while      she is inpatient and cover with sliding scale insulin.  4. Hypertensive urgency - improved.  Blood pressure was labile in the      emergency department.  We will continue her home medications to      monitor, maybe related to her headache.  5. Deep vein thrombosis prophylaxis, Lovenox.  6. Disposition - anticipate discharge in the next 2-3 days following      formal evaluation.      Kari Baars, M.D.  Electronically Signed     WS/MEDQ  D:  05/20/2009  T:  05/21/2009  Job:  [id number]   cc:   Elmore Guise., M.D.  Tera Mater. Evlyn Kanner, M.D.

## 2011-03-29 NOTE — Consult Note (Signed)
NAME:  [name], [name] NO.:  [id number]   MEDICAL RECORD NO.:  [id number]          PATIENT TYPE:  OBV   LOCATION:  3706                         FACILITY:  [facility]   PHYSICIAN:  Cassell Clement, M.D. DATE OF BIRTH:  09/10/1926   DATE OF CONSULTATION:  05/20/2009  DATE OF DISCHARGE:                                 CONSULTATION   I was asked to see this patient by emergency room physician, Dr. Fonnie Jarvis  for cardiology consultation regarding her chest pain and her cardiac  management.  This patient has been followed chronically by Dr. Lady Deutscher.  She was last seen in our office on February 23, 2009, and she was  subsequently hospitalized at [facility] from Apr 07, 2009, to Apr 09, 2009,  for chest pain and hypertension.  She ruled out for myocardial  infarction, did not require cardiac catheterization, and chest pain  resolved after her blood pressure was brought under good control.  She  was discharged to be followed up in the office.  She has a history of  dyslipidemia and diabetes, and a remote history of breast cancer.  She  has had exogenous obesity.  Of note is the fact that she had a known  chronic occlusion of a right coronary artery by catheterization in 2006.  In 2008, because of crescendo angina, there was an attempt to cross the  lesion by Dr. [place], but it was unsuccessful because we could not get a  wire across.  She has been managed medically.  She has had diabetes for  the past 23 years and a long history of hypertension as well.  She was  in her usual state of health until last evening.  She noted the onset of  an occipital headache at about 1 a.m.  She states that she did not sleep  from 1 a.m. to 6 a.m. because of the headache.  She then managed to  sleep for about 2 hours and when she woke up at around 8 a.m., she still  had a headache and was also having expressive aphasia and word salad.  She also noted some chest discomfort.  The nurse out at the [facility] was consulted and checked her blood pressure, which was  significantly elevated and the patient was transferred by EMTs to [facility]  Emergency Room.  She was given aspirin at home prior to transport.  She  had not been on aspirin at home recently.  Her office records indicate  that she previously had been on it and was stopped several visits ago  for reasons that the patient cannot recall.   PRESENT MEDICATIONS:  1. Imdur 30 mg twice a day.  2. Furosemide 40 mg daily.  3. Potassium 20 mEq daily.  4. Diovan 160 mg daily.  5. Inderal 10 mg twice a day.  6. Insulin 70/30, 16 units twice a day.  7. Lovaza 1 g daily.  8. Synthroid 75 mcg daily with an extra tablet on Saturdays.  9. Simvastatin 40 mg daily.  10.Vitamin D 50,000 units once a week.  11.Glucosamine daily.  12.Colestid 5 g daily.   ALLERGIES:  The patient is allergic to LIDOCAINE, PENICILLIN, and  XYLOCAINE.   FAMILY HISTORY:  The patient's father died of a heart attack at 55,  mother died at 29 of a questionable stroke.   SOCIAL HISTORY:  She lives at [facility] with her husband.  She  is a retired Engineer, site.   REVIEW OF SYSTEMS:  All other review of systems reviewed and are  negative.  Of note is the fact that she does give a remote history of GI  bleed from gastric erosions and she has not had any recent GI symptoms.   PHYSICAL EXAMINATION:  VITAL SIGNS:  Tonight, her blood pressure is  168/85, pulse is 80 and normal sinus rhythm, temperature 98.  GENERAL APPEARANCE:  Pleasant woman in no acute distress.  Face is  plethoric.  She is alert and speech is normal.  HEENT:  Pupils equal and reactive.  Extraocular movements are full.  Sclerae nonicteric.  Mouth and pharynx normal.  NECK:  Carotids, no bruits.  Jugular venous pressure normal.  Thyroid  normal.  CHEST:  Clear to auscultation.  HEART:  Grade 2/6 systolic ejection murmur at the left sternal edge and  apex.  There is no gallop.   ABDOMEN:  No hepatosplenomegaly or mass.  There are no bruits.  EXTREMITIES:  Hyperpigmentation of both lower extremities consistent  with longstanding diabetes.  She has 1+ pedal pulses.  There is trace  edema.  No phlebitis.  NEUROLOGIC:  Nonfocal and as noted speech is now normal.   Her electrocardiogram shows normal sinus rhythm, left anterior  hemiblock, no acute ischemic changes.  Her hemoglobin is elevated at  17.2, hematocrit 50, platelets 164,000.  Sodium 139, potassium 4.2, BUN  14, creatinine 0.81, hemoglobin A1c 6.1, sed rate 8.  Cardiac enzymes  negative.  Point-of-care x2.  Cholesterol 126, LDL 44, HDL 66.   IMPRESSION:  1. No evidence of acute myocardial infarction.  2. Known ischemic heart disease with known chronic occlusion of the      right coronary artery.  3. Transient ischemic attack, resolved.  4. Diabetes mellitus.  5. Hypertensive cardiovascular disease.   RECOMMENDATIONS:  I agree with present management as outlined by Dr.  Clelia Croft and Dr. Pearlean Brownie.  The patient appears to be presently stable.  Neurology has recommended that she be randomized in the point trial  which consists of aspirin plus placebo versus aspirin plus Plavix.  There are no contraindications to this study as far as I can determine.  Good blood pressure control going forward will be very important.  She  does give a history of remote GI bleed, so I will add H2 blocker  empirically in the form of Pepcid 20 mg b.i.d.  Dr. Lady Deutscher will  see the patient in the morning.           ______________________________  Cassell Clement, M.D.     TB/MEDQ  D:  05/20/2009  T:  05/21/2009  Job:  [id number]   cc:   Pramod P. Pearlean Brownie, MD  Kari Baars, M.D.  Elmore Guise., M.D.

## 2011-03-29 NOTE — Discharge Summary (Signed)
NAMEDESARAY, MARSCHNER                 ACCOUNT NO.:  [id number]   MEDICAL RECORD NO.:  [id number]          PATIENT TYPE:  INP   LOCATION:  2030                         FACILITY:  [facility]   PHYSICIAN:  Cassell Clement, M.D. DATE OF BIRTH:  Oct 29, 1926   DATE OF ADMISSION:  11/30/2007  DATE OF DISCHARGE:  12/02/2007                               DISCHARGE SUMMARY   FINAL DIAGNOSES:  1. Chest pain, myocardial infarction ruled out.  2. Known coronary artery disease with known right coronary artery      occlusion.  3. Congestive heart failure.  4. Essential tremor.  5. Diverticular disease.  6. History of gastroenteritis.  7. Hyperlipidemia.  8. Type 2 diabetes mellitus.  9. Hypertension.  10.Arthritis.  11.History of breast cancer.   OPERATIONS PERFORMED:  None.   HISTORY:  This is a 77 year old Caucasian female with known coronary  artery disease with known right coronary artery occlusion.  She  presented to the emergency room with chest pain and shortness of breath  and increased peripheral edema.  She has a past history of hypertension.  She had a cardiac catheterization in 2006 which showed a proximal right  coronary artery occlusion but good left-to-right collaterals and she has  been treated medically.  She is followed for her diabetes by Dr. Ardyth Harps.  Recently she has had an ulcer on her toe and has been on Silver  Silvadene cream twice a day and is on her second round of doxycycline  100 mg twice a day from Dr. Evlyn Kanner.   PHYSICAL EXAMINATION:  VITAL SIGNS:  On physical exam her weight was 99  kg on admission and at discharge she weighed 94.3 kg.  Blood pressure on  admission 176/65, pulse of 67 and regular, O2 saturation at 97%.  LUNGS:  Revealed a few crackles at the bases.  HEART:  Reveals soft systolic murmur at the left sternal edge.  ABDOMEN:  Was negative.  EXTREMITIES:  Extremities showed 2+ edema.   Her chest x-ray on admission showed mild cardiomegaly.  Her  electrocardiogram showed nonspecific ST-T wave changes and first degree  AV block with a PR of 0.25.  There was no change from the previous EKG  dated May 18, 2005.   HOSPITAL COURSE:  The patient was admitted.  She was found to be in mild  congestive heart failure and she was treated with IV Lasix.  She had a  good clinical response with 5 kg or 10 pound weight loss over the 3 day  hospitalization.  Her blood pressure also normalized and she diuresed.  Serial cardiac enzymes were obtained and showed no evidence of  myocardial damage.  Her lipids were satisfactory with cholesterol 130,  LDL 57, HDL 48, triglycerides 124.  Her hemoglobin was 15.2 on admission  and 15.4 at discharge.  Her white count remained normal in the hospital  stay.  Her BUN remained stable despite aggressive diuresis with a BUN on  admission of 21 and a creatinine on admission of 0.86 and at discharge  BUN was 19 with a creatinine of 1.01.  Blood sugars were generally good  except for the morning of discharge when her sugar was 211.  Her B  natriuretic peptide on January 16 was 96 and on the day of discharge was  35.  Her TSH was normal at 1.86.  Clotting studies were normal.  Liver  function studies were normal.  Albumen was 3.4.   HOSPITAL COURSE:  The patient had no further chest pain after admission.  Her EKGs remained stable.  Her activity was progressed after she was  taken off IV Lasix and she was able to walk in the hall successfully  with no chest pain or dyspnea.  By January 18 she was stable enough to  be discharged home improved.  In reviewing her history we note that her  discharge medicines are similar to the medicines she was on at  admission.  However, she was taking a moderate amount of Aleve prior to  admission and we discussed the fact that Aleve in her situation may have  been contributing to fluid retention and thereby also elevating her  blood pressure.  She is going to try avoiding Aleve and  other  nonsteroidals if possible.   DISCHARGE INSTRUCTIONS:  She will be on a low sodium diabetic diet.  Wound care will be the Silvadene 1% cream to the toe twice a day as per  Dr. Evlyn Kanner.  She is return to see Dr. Reyes Ivan in about a week for an  office visit and a BMET.   DISCHARGE MEDICATIONS:  1. Coated aspirin 81 mg daily.  2. Simvastatin 40 mg daily.  3. Diovan 160 mg daily.  4. NovoLog 70/30 insulin pen 20 units twice a day.  5. Synthroid 75 mcg daily.  6. Lovaza 1 gram twice a day.  7. Lyrica 50 mg daily if needed for knee pain.  8. Doxycycline 100 mg twice a day for another week or as directed by      Dr. Evlyn Kanner.  9. Klor-Con 20 mEq twice a day.  10.Colace 100 mg stool softener daily.  11.Prilosec OTC 20 mg daily.  12.Colestid 7.5 powder daily.  13.Inderal LA 80 one daily.  14.Nitro-Dur 0.2 mg patch daily.  15.Lasix 40 mg twice a day.  16.Nitrostat 1/150 as needed for chest pain.  17.Tylenol p.r.n. for arthritic discomfort.   On the day of discharge her sodium is 138, potassium 3.8, BUN 19,  creatinine 1.01.   CONDITION ON DISCHARGE:  Is improved.   Also of note is that she had a 2-D echo in the hospital read by Dr.  Reyes Ivan showing left ventricular systolic function, EF of 55% and with  diastolic dysfunction and with mild aortic valve sclerosis.          ______________________________  Cassell Clement, M.D.    TB/MEDQ  D:  12/02/2007  T:  12/02/2007  Job:  [id number]   cc:   Jeannett Senior A. Evlyn Kanner, M.D.

## 2011-03-29 NOTE — Consult Note (Signed)
NAME:  [name], [name]                 ACCOUNT NO.:  [id number]   MEDICAL RECORD NO.:  [id number]          PATIENT TYPE:  OBV   LOCATION:  3706                         FACILITY:  [facility]   PHYSICIAN:  Pramod P. Pearlean Brownie, MD    DATE OF BIRTH:  1926/05/22   DATE OF CONSULTATION:  05/20/2009  DATE OF DISCHARGE:                                 CONSULTATION   REFERRING PHYSICIAN:  Wayland Salinas, MD   REASON FOR REFERRAL:  TIA.   HISTORY OF PRESENT ILLNESS:  Ms. Raetz is an 77 year old pleasant  Caucasian lady who woke up this morning at 6 o'clock with transient  expressive speech difficulties.  She stated, she will make paraphasic  errors and nonfluent speech and difficulty speaking.  She also  complained of occipital headache, which at that time was 8/10 in  severity not accompanied by light or sound sensitivity or nausea.  Since  arrival here, the speech has improved within 2 hours and headache has  gone down 3/10 in severity.  She has no prior history of migraine  headache and has never had similar headaches in the past.  She does get  mild headaches with nitroglycerin, but the present headache was  different and she has never had speech problems with her previous  headaches.  She denied any loss of vision, double vision, focal  extremity weakness, gait or balance problem.  There is no prior history  of TIA stroke.  She does have history of benign essential head trauma  and sees Dr. Anne Hahn in her office for that.   PAST MEDICAL HISTORY:  Significant for aortic stenosis, coronary artery  disease, diabetes, diverticulosis, hyperlipidemia, hypertension,  hypothyroidism, essential head tremors.  Remote breast cancer.   HOME MEDICATIONS:  Colestipol, Diovan, Flector, Imdur, Synthroid,  Inderal, Lasix, Lovaza, potassium, and Zocor.   MEDICATION ALLERGIES:  LIDOCAINE, PENICILLIN, and XYLOCAINE.   FAMILY HISTORY:  Positive for mother with the stroke and father with MI.   SOCIAL HISTORY:  She is a  retired Engineer, site, and independent with  activities of daily living.  Does not smoke or drink.   REVIEW OF SYSTEMS:  Positive for some chest heaviness and pain earlier  today, which is now improved.  No shortness of breath, cough, diarrhea,  or other illness.   PHYSICAL EXAMINATION:  GENERAL:  Pleasant elderly Caucasian lady,  currently not in distress.  VITAL SIGNS:  Temperature 97.8.  Blood pressure at present is 105/89,  but on admission was elevated at 166/80, she was treated with some  medications.  Respiratory rate 16 per minute, distal pulses well felt.  HEAD:  Nontraumatic.  NECK:  Supple.  There is no bruit.  ENT:  Unremarkable.  CARDIAC:  Regular heart sounds.  Soft ejection murmur.  LUNGS:  Clear to auscultation.  NEUROLOGIC:  She is pleasant, awake, alert, and cooperative.  She is  oriented to time, place, and person.  There is no aphasia, apraxia, or  dysarthria.  Her eye movements are full range.  Face is symmetric.  Tongue is midline.  Motor system exam symmetric in upper  and lower  extremities.  Strength, tone, reflexes, and ankle jerks are depressed.  Plantars are downgoing.  On the NIH stroke scale, she scored 0.  Modified Rankin 0.   DATA REVIEWED:  Noncontrast CAT scan of the head shows no acute  abnormality.  Basic metabolic panel, labs, CBC, and coagulation labs,  are normal.  EKG shows normal sinus rhythm without any ischemic  findings.  First set of cardiac enzymes is negative.   IMPRESSION:  An 77 year old lady with transient episode of expressive  language difficulties, likely a left hemispheric transient ischemic  attack.  The patient has multiple vascular risk factors of diabetes,  hypertension, hyperlipidemia, and coronary artery disease.  She also has  benign essential head tremor.   PLAN:  The patient is readmitted for further TIA workup.  We will check  MRI scan of the brain with MRA of the brain, carotid ultrasound, 2-D  echocardiogram,  fasting lipid profile, hemoglobin A1c, and homocysteine.  The patient may qualify for the a point trail for secondary stroke  prevention.  I have discussed this trial weekly with the patient and her  husband and they seemed interested.  We will see if she meets inclusive  and exclusive criteria and discuss this also with her primary physician,  Dr. Clelia Croft.           ______________________________  Sunny Schlein. Pearlean Brownie, MD     PPS/MEDQ  D:  05/20/2009  T:  05/21/2009  Job:  [id number]

## 2011-03-29 NOTE — Op Note (Signed)
NAMESCHERRY, Nicole NO.:  [id number]   MEDICAL RECORD NO.:  [id number]          PATIENT TYPE:  INP   LOCATION:  3308                         FACILITY:  [facility]   PHYSICIAN:  Payton Doughty, M.D.      DATE OF BIRTH:  10-Jul-1926   DATE OF PROCEDURE:  07/16/2008  DATE OF DISCHARGE:                               OPERATIVE REPORT   PREOPERATIVE DIAGNOSIS:  Right L4-5 foraminal disk.   POSTOPERATIVE DIAGNOSIS:  Right L4-5 foraminal disk.   OPERATIVE PROCEDURE:  Right L4-5 foraminal diskectomy.   SURGEON:  Payton Doughty, MD   ANESTHESIA:  General endotracheal.   PREPARATION:  Prepped and draped with alcohol wipe.   COMPLICATIONS:  None.   ASSISTANT:  Danae Orleans. Venetia Maxon, MD   BODY OF THE TEXT:  A 77 year old lady with herniated disk on the right  side at L4-5 in the neural foramen, taken to the operating room,  smoothly anesthetized and intubated, placed prone on the operating  table.  Following shave, prep and drape in the usual sterile fashion,  the skin was incised over the lamina of L4.  Lamina of L4 was isolated  in subperiosteal plane and several x-rays were required to confirm that  because of the patient's body habitus.  Having confirmed we were at the  correct level, the inferolateral part of the pars reticularis, the  superior medial part of the articular facet was drilled out using a high-  speed drill down the ligamentum flavum that was removed.  This exposed  the right L4 root and the dorsal root ganglion.  Working with a Kerrison  up under the facet joint, the disk space was identified.  It was entered  and a large fragment of disk recovered from right under the root and  resulted in immediate decompression of the root .  The wound was  irrigated.  Hemostasis assured.  Depo-Medrol soaked fat was used to fill  the bony defect.  Successive layers of 0 Vicryl, 2-0 Vicryl and 3-0  nylon were used to close.  Betadine and Telfa dressing was applied and  made  occlusive with OpSite and the patient returned to recovery room in  good condition.           ______________________________  Payton Doughty, M.D.     MWR/MEDQ  D:  07/16/2008  T:  07/17/2008  Job:  [id number]

## 2011-03-29 NOTE — H&P (Signed)
[name], [name]                 ACCOUNT NO.:  [id number]   MEDICAL RECORD NO.:  [id number]          PATIENT TYPE:  INP   LOCATION:  2030                         FACILITY:  [facility]   PHYSICIAN:  Lorain Childes, MD DATE OF BIRTH:  04/30/1926   DATE OF ADMISSION:  11/30/2007  DATE OF DISCHARGE:                              HISTORY & PHYSICAL   CHIEF COMPLAINT:  Chest pain.   PRIMARY CARDIOLOGIST:  Rosine Abe, M.D.   PRIMARY CARE DOCTOR:  Tera Mater. [place], M.D.   HPI:  The patient is a 77 year old female with known coronary disease  with RCA occlusion who presents to the emergency room for evaluation of  chest discomfort.  The patient reports that she has had shoulder pain  for the past 2 to 3 days.  Initially, she thought that it was related to  carrying some grocery bags.  She has taken some Aleve for it.  However,  it remained constant.  Last evening, in addition to her shoulder pain,  she developed some chest discomfort in her midchest radiated up to a 5  or 6 out of 10 and described it as tightness going across her chest  radiating down her left arm, worsening her shoulder discomfort.  This  initially began around 9 p.m. and increased progressively throughout the  night.  At 1 a.m. she and her husband called 911.  EMS obtained an EKG,  which was unremarkable.  They gave her some sublingual nitroglycerin,  which improved her pain and also aspirin.  She was brought to the  emergency room.  Her blood pressure was running elevated, up to 219/90.  She was given further nitroglycerin and started on a nitroglycerin drip,  which improved her pain down to less than 2 out of 10.  Currently she  reports 1 out of 10 chest discomfort.  With her chest discomfort, she  also reports some shortness of breath and diaphoresis.  She reports some  mild palpitations but no syncopal event.  On further questioning, she  states that she has had some shortness of breath progressing over the  past 2 nights.  She typically uses 2 pillows but also has increased to  using an additional neck pillow to improve her breathing.  She states  that she has lower extremity edema, which has been increasing.  However,  she relates most of this to a foot ulcer and inability to wear her  stockings.   PAST MEDICAL HISTORY:  1. CAD.  She had a cardiac catheterization in 2006, which noted      proximal RCA occlusion but good left-to-right collateral flow.  She      had nonobstructive disease in her left system.  She reports that      she had an admission in April of 2008 with a repeat cardiac      catheterization at that time, which again confirmed the right      coronary artery blockage.  She was noted to have some degree of      aortic stenosis and her left system was noted to be  normal, per her      report.  This most recent catheterization is not available in the      electronic medical record.  2. Lower GI bleed, related to gastroenteritis; possible      diverticulitis.  3. History of hyperplastic colon polyps in 2005, which were removed.      She had a repeat colonoscopy, which was negative.  4. Diabetes.  5. Hypertension.  6. Dyslipidemia.  7. History of breast cancer, status post partial mastectomy on the      right.  8. Familial tremor.  9. Left foot ulcer.  10.Right lower extremity ulcer.  11.Arthritis.  12.Carpal tunnel syndrome, status post surgery.   SOCIAL HISTORY:  She lives in [place] [facility] with her husband.  She  is retired.  She had 4 kids.  One died from colon cancer.  She has no  tobacco, no alcohol, no drugs.   MEDICATIONS:  1. Lasix 60 mg p.o. every a.m.  2. Potassium chloride 40 mEq p.o. b.i.d. and 20 mg p.o. every      afternoon.  3. Inderal 80 mg p.o. daily.  4. Diovan 150 mg p.o. daily.  5. Nitroglycerin patch 0.4 mg.  6. Glucosamine.  7. Beta carotene.  8. Aleve p.r.n.  9. Insulin 70/30 twenty units subcu q.a.m. and 20 units subcu every       dinner.  10.Simvastatin 40 mg p.o. q.h.s.  11.Synthroid 75 mcg p.o. daily.  12.Lunesta p.r.n.  13.Lyrica 50 mg p.o. daily.  14.Silver sulfadiazine 1% cream twice to the toe b.i.d.  15.Doxycycline 100 mg p.o. b.i.d., which she has completed a 10-day      course and is currently taking a 15-day course for her right lower      extremity cellulitis, which is improving.  16.Colested 7.5 g p.o. daily.   ALLERGIES:  1. Celebrex.  2. Lidocaine.  3. Novocain.  4. Penicillin.  5. Xylocaine.  6. Adhesive tape.   FAMILY HISTORY:  Her mother died at the age of 74 from natural causes.  Father died at the age of 55; unknown cause.  She has siblings, which  are okay.   REVIEW OF SYSTEMS:  She denies any fevers or chills.  She does have a  headache but no visual changes.  No focal weakness.  No new skin rashes  or lesion and improved lesion on her leg.  Chest pain, as stated in the  HPI.  Some mild shortness of breath and dyspnea on exertion.  Two-to-  three pillow orthopnea, PND, increasing lower extremity edema, mild  palpitations and syncope.  No urinary symptoms.  No focal weakness or  numbness.  No nausea or vomiting.  No diarrhea.  No bright red blood per  rectum.  No hematemesis.  All other systems are negative.   PHYSICAL EXAM:  VITAL SIGNS:  Temperature 97.5, pulse 64, respiration  20, blood pressure currently is 154/70.  She has been as high as 219/90,  satting 98% on 2 liters nasal cannula.  GENERAL:  She is a pleasant female, comfortable, in no acute distress.  HEENT/NECK:  Her JVP is approximately 9 to 10 cm.  She has 2+ carotid  upstrokes.  There are no bruits appreciated.  CARDIOVASCULAR:  Normal S1, short S2, regular rhythm.  She has a soft  systolic murmur at the right and left upper sternal border.  She has a  S4 positive, no S3.  Her pulses are equal throughout.  LUNGS:  She has  faint crackles at the bases bilaterally.  SKIN:  Her right leg ulcer has erythema.  It is  healing well.  There is  no warmth.  There is no tenderness.  ABDOMEN:  Soft.  No organomegaly.  Nontender.  EXTREMITIES:  She has 2+ edema.  Pulses intact.  NEUROLOGIC:  She is alert and oriented.  Grossly nonfocal.   CHEST X-RAY:  Shows cardiomegaly and mild edema.   EKG:  Sinus rhythm.  Rate 62.  Left axis deviation.  PR interval of 254.  QRS of 83.  QTC of 410 milliseconds.  There is minimal ST segment  depression in aVL, which is unchanged from prior EKG dated May 18, 2005.   LABORATORY DATA:  Shows a white count of 6.4, hematocrit of 45.4,  platelets of 176, potassium 3.9.  Creatinine is pending.  Glucose is  112.  Point of cares are pending.   ASSESSMENT AND PLAN:  The patient is a 77 year old female with known  coronary disease with right coronary artery occlusion, some degree of  aortic stenosis, who presents with chest pain since 9 p.m.  She also has  had increasing shortness of breath and orthopnea over the past few days.  1. Coronary artery disease:  We will admit her on telemetry and cycle      her cardiac enzymes.  Will admit to step-down status since she is      requiring a nitroglycerin drip, which she is tolerating.  Her chest      pain has essentially resolved.  We will continue her aspirin,      Inderal, Diovan, and statins.  We will start Lovenox and titrate      the nitroglycerin as needed.  The patient reports that she has had      a catheterization done in April of 2008.  We need to obtain the      results of this, which are currently not available.  We can review      this catheterization to decide on further management.  2. Heart failure:  She has some degree of aortic stenosis.  It does      not appear to be severe.  She has a 2nd heart sound and good      carotid upstroke.  I would recommend repeating an echocardiogram,      which we can obtain tomorrow.  We will diurese her gently during      this hospitalization.  We will control her blood pressure.  3.  Hypertension:  Her blood pressure is elevated this evening.  She is      on a nitroglycerin drip, which she is tolerating well.  We can      adjust her medications as needed to improve optimal blood pressure      control.  4. Diabetes:  We will continue her insulin, with sliding scale.  5. Dyslipidemia:  We will check her fasting lipid panel.  Continue      simvastatin.  6. Leg ulcers:  We will continue her on her doxycycline during her      hospitalization.  She has been afebrile.  She has no white count.      Overall it appears very stable and improved, by her report.  I will      also continue her silver sulfadiazine for her toe ulcer.      Lorain Childes, MD  Electronically Signed     CGF/MEDQ  D:  11/30/2007  T:  11/30/2007  Job:  [id number]

## 2011-04-01 NOTE — Procedures (Signed)
[facility]  Patient:    [name], [name]                          MRN: [id number] Proc. Date: 06/20/01 Attending:  Verlin Grills, M.D. CC:         Tera Mater. Evlyn Kanner, M.D.   Procedure Report  REFERRING PHYSICIAN:  Jeannett Senior A. Evlyn Kanner, M.D.  PROCEDURE INDICATION:  [name] (date of birth Mar 11, 1926) is a 77 year old female.  Her son developed rectal cancer and died at age 73.  [name] is due for surveillance colonoscopy with polypectomy to prevent colon cancer.  ENDOSCOPIST:  Verlin Grills, M.D.  PREMEDICATION:  Versed 7 mg and Demerol 50 mg.  ENDOSCOPE:  Olympus pediatric colonoscope.  PROCEDURE:  After obtaining informed consent, [name] was placed in the left lateral decubitus position.  I administered intravenous Versed and intravenous Demerol to achieve conscious sedation for the procedure.  The patients blood pressure, oxygen saturation, and cardiac rhythm were monitored throughout the procedure and documented in the medical record.  Anal inspection was normal.  Digital rectal exam was normal.  The Olympus pediatric video colonoscope was introduced into the rectum and easily advanced to the cecum.  Colonic preparation for the exam today was excellent.  The rectum was normal.  Sigmoid colon and descending colon: extensive left colonic diverticulosis.  At 20 cm from the anal verge, a 1 mm sessile polyp was removed with the cold biopsy forceps.  Splenic flexure normal.  Transverse colon normal.  Hepatic flexure normal.  Ascending colon normal.  Cecum and ileocecal valve normal.  ASSESSMENT: 1. Extensive left colonic diverticulosis. 2. From the distal sigmoid colon, at 20 cm from the anal verge, a 1 mm sessile polyp was removed with the cold biopsy forceps and submitted to pathology for interpretation.  RECOMMENDATIONS: Repeat colonoscopy in approximately 5 years. DD:  06/20/01 TD:  06/20/01 Job:  [id number]

## 2011-04-01 NOTE — Op Note (Signed)
NAME:  [name], [name]                               ACCOUNT NO.:  [id number]   MEDICAL RECORD NO.:  [id number]                   PATIENT TYPE:  AMB   LOCATION:  ENDO                                 FACILITY:  [facility]   PHYSICIAN:  Danise Edge, M.D.                DATE OF BIRTH:  Jun 22, 1926   DATE OF PROCEDURE:  07/12/2004  DATE OF DISCHARGE:                                 OPERATIVE REPORT   PROCEDURE:  Esophagogastroduodenoscopy and colonoscopy.   INDICATIONS FOR PROCEDURE:  [name] is a 77 year old female born  on 09/02/1926.  [name] has chronic diabetes mellitus and chronic  nonbloody diarrhea, which did not respond to an empiric trial of Flagyl plus  Pepto-Bismol.   ENDOSCOPIST:  Danise Edge, M.D.   PREMEDICATION:  Versed 6 mg, Demerol 60 mg.   PROCEDURE:  After obtaining informed consent, [name] was placed in the  left lateral decubitus position.  I administered intravenous Demerol and  intravenous Versed to achieve conscious sedation for the procedure.  Patient's blood pressure, oxygen saturation, and cardiac rhythm were  monitored throughout the procedure and documented in the medical record.   The Olympus gastroscope was passed through the posterior hypopharynx and the  proximal esophagus without difficulty.  The hypopharynx, larynx, and vocal  cords appeared normal.   ESOPHAGOSCOPY:  The proximal, mid, and lower segments of the esophageal  mucosa appear normal.   GASTROSCOPY:  Retroflexed view of the gastric cardia and fundus was normal.  The gastric body, antrum, and pylorus appeared normal.   DUODENOSCOPY:  The duodenal bulb, mid duodenum, and distal duodenum appeared  normal.   SMALL BOWEL BIOPSIES:  Small bowel biopsies were taken from the second and  third portion of the duodenum to rule out Giardiasis and celiac sprue.   ASSESSMENT:  Normal esophagogastroduodenoscopy.  Small bowel biopsy  pathology pending.   PROCEDURE:   Proctocolonoscopy to the cecum.  Anal inspection and digital  rectal exam was normal.  The Olympus adjustable pediatric colonoscope was  introduced into the rectum and advanced to the cecum.  Colonic preparation  for the exam today was excellent.   RECTUM:  Normal.   SIGMOID COLON/DESCENDING COLON:  Extensive left colonic diverticulosis.   SPLENIC FLEXURE:  Normal.   TRANSVERSE COLON:  Normal.   HEPATIC FLEXURE:  Normal.   ASCENDING COLON:  Normal.   CECUM AND ILEOCECAL VALVE:  Normal.   RANDOM COLONIC BIOPSIES:  Approximately six random colonic biopsies were  taken from the right colon and left colon and sent to pathology to rule out  microscopic/collagenous colitis.   ASSESSMENT:  Normal screening proctocolonoscopy of the cecum.  Extensive  left colonic diverticulosis.  Colonic biopsy pathology pending.  Danise Edge, M.D.    MJ/MEDQ  D:  07/12/2004  T:  07/12/2004  Job:  [id number]

## 2011-04-01 NOTE — H&P (Signed)
NAME:  [name], [name] NO.:  [id number]   MEDICAL RECORD NO.:  [id number]          PATIENT TYPE:  OUT   LOCATION:  CATS                         FACILITY:  [facility]   PHYSICIAN:  Peter M. Swaziland, M.D.  DATE OF BIRTH:  1926-02-07   DATE OF ADMISSION:  12/14/2006  DATE OF DISCHARGE:  12/14/2006                              HISTORY & PHYSICAL   HISTORY OF PRESENT ILLNESS:  Ms. Word is a 77 year old white female who  is admitted for a cardiac catheterization for evaluation of refractory  angina.  She has a history of known coronary artery disease.  She has a  history of hypertension, diabetes mellitus, dyslipidemia, and breast  cancer.  She presented in 2006 with increasing exertional angina.  She  underwent cardiac catheterization at that time and she was noted to have  scattered nonobstructive disease in the left coronary system.  There  were left-to-right collaterals to the right coronary artery.  The right  coronary artery was occluded by flush shots.  At that time we were not  able to engage the right coronary ostium.  Since then she has been  treated with aggressive medical therapy but despite this has had really  refractory and limiting angina.  She describes chest pain with any  exertion and she has to stop and rest frequently, even doing simple  household chores.  In order to further evaluate her, she did undergo  cardiac CT angiography.  This did demonstrated that the right coronary  arose from the right coronary cusp although anteriorly.  It was occluded  proximally.  There was severe calcification in the left coronary system.  The left circumflex coronary did not appear to have significant  obstructive disease.  There were portions of the proximal LAD that could  not be evaluated due to the severe calcification.  At this point knowing  the origin of the right coronary, it is felt that we should repeat her  cardiac catheterization.  If she has no obstructive  disease in the LAD  system, then to attempt opening chronic total occlusion of the right  coronary artery for symptom relief.  The patient is admitted at this  time for that purpose.   PAST MEDICAL HISTORY:  1. History of diverticulosis.  2. She has a benign familial tremor.  3. She has diabetes mellitus type 2.  4. Hypertension.  5. Chronic neuropathic pain.  6. Arthritis.  7. Rosacea.  8. Hyperlipidemia.  9. History of breast cancer.   PAST SURGICAL HISTORY:  Prior surgeries include:  1. Tonsillectomy.  2. Appendectomy.  3. D&C.  4. Hysterectomy.  5. Knee surgery.  6. Lumpectomy.  7. Previous neck fracture with fusion.  8. Cholecystectomy.  9. Right rotator cuff repair.  10.Right carpal tunnel surgery repair.  11.Right thumb and carpal tunnel repair.  12.She has had previous radiation therapy for breast cancer.   ALLERGIES:  LIDOCAINE, XYLO, NOVOCAIN, PENICILLIN, BEE STINGS, ADHESIVE  TAPE, and CELEBREX.   CURRENT MEDICATIONS:  Current medications include:  1. Clonazepam 2 mg per day.  2. Cholestin 7.5  grams daily.  3. Diovan 160 mg per day.  4. Furosemide 40 mg per day.  5. Potassium a total of 100 mEq daily.  6. Inderal LA 80 mg per day.  7. Insulin 70/30 13 units in the morning and 18 units at night.  8. Nexium 40 mg per day.  9. Plavix 75 mg per day.  10.Ranexa 500 mg per day.  11.Synthroid 0.75 mg per day.  12.Topamax 100 mg 1-1/2 tablets daily.  13.Trazodone 75 mg per day.  14.Vytorin 10/20 mg per day.  15.Nitroglycerin p.r.n.   SOCIAL HISTORY:  The patient is married.  She is a retired  Chartered loss adjuster.  She denies history of tobacco or alcohol use.   FAMILY HISTORY:  Family history is positive for hypertension and stroke.  One daughter, age 60, has heart disease.  One daughter has hypertension,  diabetes mellitus.   REVIEW OF SYSTEMS:  Review of systems are as noted above.  She has had  no history of bleeding trouble.  She has had no history of TIA  or  stroke.  Denies any claudication symptoms.  Other review of systems are  negative.   PHYSICAL EXAMINATION:  GENERAL:  An obese white female in no apparent  distress.  VITAL SIGNS:  Her weight is 214, blood pressure is 120/85, pulse 72 and  regular.  HEENT:  She is normocephalic, atraumatic.  Pupils are equal, round and  reactive.  Extraocular movements were full.  Oropharynx is clear.  NECK:  Supple without JVD, adenopathy, thyromegaly or bruits.  LUNGS:  Clear to auscultation and percussion.  CARDIAC:  Regular rate and rhythm.  There is a normal S1 and S2 without  gallop or click.  She does have a grade 1-2/6 systolic ejection murmur.  ABDOMEN:  Obese, soft and nontender.  There are no masses.  EXTREMITIES:  Without edema.  Pulses were 2+ and symmetric.  NEUROLOGIC:  Nonfocal.   LABORATORY DATA:  Glucose is 168, BUN 16, creatinine 1.1, sodium 142,  potassium 4.6, chloride 108, CO2 27.  White count 6800, hemoglobin 15.4,  hematocrit 46.8, platelets 229,000.  Coags were normal.   IMPRESSION:  1. Refractory anginal symptoms despite optimal medical therapy.  2. Single vessel occlusive atherosclerotic coronary artery disease.  3. Diabetes mellitus type 2.  4. Hypertension.  5. Hyperlipidemia.  6. Multiple allergies.   PLAN:  Will undergo diagnostic cardiac catheterization and see if she is  a candidate for percutaneous intervention for chronic total occlusion of  the right coronary artery.           ______________________________  Peter M. [place]il[place], M.D.     PMJ/MEDQ  D:  12/22/2006  T:  12/23/2006  Job:  [id number]   cc:   Elmore Guise., M.D.  Tera Mater. Evlyn Kanner, M.D.

## 2011-04-01 NOTE — Op Note (Signed)
[place]  Patient:    [name], [name] Visit Number: [id number] MRN: [id number]          Service Type: EMS Location: [place] [place] Attending Physician:  Pearletha Alfred Dictated by:   Nicki Reaper, M.D. Proc. Date: 10/16/01 Admit Date:  08/15/2001 Discharge Date: 08/15/2001                             Operative Report  PREOPERATIVE DIAGNOSIS:  Stenosing tenosynovitis, right middle and right ring fingers.  POSTOPERATIVE DIAGNOSIS:  Stenosing tenosynovitis, right middle and right ring fingers.  OPERATION PERFORMED:  Release of A1 pullies, right middle and right ring fingers.  SURGEON:  Nicki Reaper, M.D.  ASSISTANT:  Joaquin Courts, R.N.  ANESTHESIA:  General.  ANESTHESIOLOGIST:  Halford Decamp, M.D.  INDICATIONS:  The patient is a 77 year old female with a history of triggering of her right middle and right ring finger, not responsive to conservative treatment.  DESCRIPTION OF PROCEDURE:  The patient is brought to the operating room where a general anesthetic was carried out without difficulty.  She was prepped and draped using DuraPrep.  A forearm tourniquet was placed.  This was inflated to 250 mmHg.  After exsanguination with an Esmarch bandage, an oblique incision was made over the A1 pulley of the middle and ring fingers, and carried down through the subcutaneous tissue.  The ring finger was attended to first.  Two large flexor sheath cysts were encountered.  The A1 pulley was entirely excised, removing the cysts with it.  These were sent to pathology, and a small incision was made in A1 centrally.  The finger was placed through a full range of motion.  No further triggering was evident.  The dissection was carried down on the middle finger, again protecting the neurovascular bundles radially and ulnarly.  No cysts were encountered.  The A1 pulley was incised on the radial aspect.  An incision was made in A1.  The finger was  placed through a full range of motion.  No further treatment was evident.  Each wound was irrigated and closed with interrupted #5-0 nylon sutures.  A sterile compressive dressing was applied.  The patient tolerated the procedure well and was taken to the recovery room for observation in satisfactory condition.  DISPOSITION:  She is discharged home, to return to the [facility] in one week, on Vicodin and Septra DS. Dictated by:   Nicki Reaper, M.D. Attending Physician:  Pearletha Alfred DD:  10/16/01 TD:  10/16/01 Job: [id number]

## 2011-04-01 NOTE — Cardiovascular Report (Signed)
NAME:  [name], EID NO.:  [id number]   MEDICAL RECORD NO.:  [id number]          PATIENT TYPE:  OIB   LOCATION:  2807                         FACILITY:  [facility]   PHYSICIAN:  Peter M. Swaziland, M.D.  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  It was ras recommended we attempt percutaneous intervention of  the chronic total occlusion.  Of note, the right coronary has an  anterior takeoff.  Prior cardiac catheterization was unable to directly  engage this.  Subsequent cardiac CTA shows that it does arise from the  right coronary cusp but has an anterior takeoff.   PROCEDURE:  1. Left heart catheterization.  2. Coronary and left ventricular angiography.  3. Attempt at percutaneous intervention of the right coronary total      occlusion.   ACCESS:  Via the right femoral artery using standard Seldinger  technique.   EQUIPMENT:  6-French 4 cm left Judkins catheter, 6-French left Amplatz  #1 catheter, 6-French pigtail catheter, 6-French arterial sheath, 6-  [place] multipurpose A1 guide 0.014.  Miracle Brothers wire. PT-2 wire,  Mailman wire, and an Film/video editor.   CONTRAST:  200 mL of Omnipaque.   MEDICATIONS:  The patient had groin anesthesia with Sensorcaine.  She  received 25 mg of Benadryl IV, Angiomax bolus at 0.75 mg/kg followed by  continuous infusion, 1.75 mg/kg per minute.  Subsequent ACT was 345.   HEMODYNAMIC DATA:  Aortic pressure was 146/74 with mean of 106.  Left  ventricle pressure was 137 with an EDP of 19 mmHg.   ANGIOGRAPHIC DATA:  The left coronary arises and distributes normally.  The left main coronary is heavily calcified without  significant disease.   The left anterior descending artery is also heavily calcified throughout  the proximal and mid vessel.  There is 30-40% narrowing in the proximal  to mid LAD at the takeoff of the first diagonal branch.  There is no  significant obstructive disease noted in the diagonal or LAD.   The left circumflex coronary also is calcified without significant  disease.   The right coronary does arise anteriorly.  We were able to engage of the  left Amplatz #1 catheter.  This demonstrated approximately 40% ostial  narrowing and total occlusion of the proximal right coronary artery.  It  is also heavily calcified.  There were left-to-right collaterals to the  distal and mid right coronary artery.   Left ventricular angiography performed in RAO view demonstrates normal  left ventricular size and contractility with normal systolic function.  Ejection fraction is estimated at 65%.   We next proceeded with attempt at intervention of the chronic total  occlusion of the proximal right coronary artery.  It was very difficult  to get adequate guide support.  A left Amplatz #1 and  left Amplatz #2  guide were able to visualize the artery but gave inadequate support.  We  were able to achieve good support with a multipurpose A1 guide.  We  tried multiple attempts at crossing the total occlusion with the wire.  We initially tried a Miracle Brothers wire and then a PT-2 wire.  We  next tried a Chief Executive Officer and then finally an Film/video editor.  Despite multiple attempts and good positioning at the occlusion site, we  were unable to cross the lesion with a wire.  There were no  complications encountered.  Given the inability to cross the wire, we  aborted the procedure and the patient was sent to the holding area.   FINAL INTERPRETATION:  1. 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  2. Normal  left ventricular function.  3. Unsuccessful attempt at intervention of the proximal right coronary      artery due to inability to cross with a wire.           ______________________________  Peter M. Swaziland, M.D.     PMJ/MEDQ  D:  12/26/2006  T:  12/26/2006  Job:  [id number]   cc:   Elmore Guise., M.D.

## 2011-04-01 NOTE — Op Note (Signed)
[place]. [facility]  Patient:    [name], [name]                        MRN: [id number] Proc. Date: 11/02/00 Adm. Date:  [id number] Disc. Date: [id number] Attending:  Janalyn Rouse CC:         Tera Mater. Evlyn Kanner, M.D.   Operative Report  PREOPERATIVE DIAGNOSIS:  Carcinoma of the right breast.  POSTOPERATIVE DIAGNOSIS:  Carcinoma of the right breast.  PROCEDURE: 1. Right partial mastectomy. 2. Right axillary sentinel lymph node biopsy.  SURGEON:  Dr. Francina Ames.  ANESTHESIA:  General.  OPERATIVE PROCEDURE:  This 77 year old married female had had a mammogram which showed a spiculated mass at the 7 to 8 oclock position.  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  She is scheduled now for a right partial mastectomy and sentinel lymph node biopsy.  Prior to coming to the operating room 1 millicurie of technetium sulfur colloid was injected intradermally in the periareolar area.  After suitable general anesthesia was induced the patient was placed in the supine position with the right arm extended on the arm board.  After prepping and draping in a standard fashion we then scanned the axillae, the supraclavicular area and internal mammary chain and found only a hot spot in the axillae.  A short transverse right axillary incision was then made with dissection through the subcutaneous tissue to the clavipectoral fascia.  A self-retaining retractor inserted we were able to identify a very hot and what was a rather large lymph node.  We removed that and then scanned the axillae and there were no other palpable nodes and there were no hot nodes.  That node was submitted to the pathologist for touch prep evaluation.  While that was being done an elliptical incision was outlined around the palpable mass at about the 7 to 7:30 position in the right breast.  A wide excision was then carried out.  The specimen was then oriented for the pathologist and  submitted for touch prep for margins.  Touch prep on the lymph node was negative with a lot of fat within the lymph node.  Subcutaneous tissue was closed with 3-0 Vicryl and the skin with a 4-0 monacryl and Steri-Strips.  Touch prep on the partial mastectomy site was also cleaned and we closed that incision with a subcuticular 4-0 Monocryl and Steri-Strips.  Dressings were applied. The patient transferred to recovery room in satisfactory condition having tolerated the procedure well. DD:  11/02/00 TD:  11/03/00 Job: [id number]

## 2011-04-01 NOTE — Op Note (Signed)
[place]. [facility]  Patient:    [name], [name]                          MRN: [id number] Proc. Date: 02/24/00 Attending:  Nicki Reaper, M.D. CC:         Nicki Reaper, M.D. (two copies)                           Operative Report  PREOPERATIVE DIAGNOSES:  Recurrent carpal tunnel syndrome right hand. Stenosing tenosynovitis right thumb.  POSTOPERATIVE DIAGNOSES:  Carpal tunnel syndrome right hand. Stenosing tenosynovitis right thumb.  OPERATION:  Re-release carpal tunnel with hypothenar fat pad transfer. Release A1 pulley right thumb.  SURGEON:  Nicki Reaper, M.D.  ASSISTANT:  Carolyne Fiscal R.N.  ANESTHESIA:  General.  DATE OF OPERATION:  February 24, 2000  ANESTHESIOLOGIST:  Cliffton Asters. Ivin Booty, M.D.  HISTORY OF PRESENT ILLNESS:  The patient is a 77 year old female with a history of carpal tunnel syndrome, recurrent following release and triggering of her right thumb. These have not responded to conservative treatment.  PROCEDURE:  The patient was brought to the operating room where a general endotracheal intubation anesthesia were carried without difficulty. She was prepped and draped using Betadine scrub and solution with the right arm free. A tourniquet placed high on the arm was inflated to 250 mmHg. After exsanguination of the limb with an Esmarch bandage, a transverse incision was made over the A1 pulley and thumb carried down through subcutaneous tissue. Bleeders were electrocauterized. Neurovascular structures identified and protected. The A1 pulley was released. This was found to be markedly thickened. The thumb placed through a full range of motion. No further triggering was identified. The wound was irrigated. The skin was closed with interrupted 5-0 nylon sutures. A separate incision was then made over the palm. Carried down to the ulnar side of the wrist and across onto the forearm. Carried down through subcutaneous tissue. Bleeders again  electrocauterized. The median nerve was identified proximally. This was then traced distally. ______ of the carpal retinaculum was identified with an area ______ portion of lower forearm fascia. This entire area was released. There was moderate scarring around the nerve which was also released. The hypothenar fat pad transfer was then elevated taking care care to protect the ulnar artery and nerve. The wound was copiously irrigated after the complete release of the carpal canal. The hypothenar fat pad was then transferred and sutured to the flexor retinaculum on the radial side with interrupted 4-0 Vicryl sutures. The wound was again irrigated. Skin was closed with interrupted 5-0 nylon sutures. Sterile compressive dressing and splint were applied. The patient tolerated the procedure well and was taken to the recovery room for observation in satisfactory condition. She is discharged home to return to the [facility] in one week on Vicodin and Septra DS. DD:  02/24/00 TD:  02/24/00 Job: [id number]

## 2011-04-01 NOTE — Cardiovascular Report (Signed)
NAME:  [name], [name]                   ACCOUNT NO.:  [id number]   MEDICAL RECORD NO.:  [id number]          PATIENT TYPE:  OIB   LOCATION:  6501                         FACILITY:  [facility]   PHYSICIAN:  Elmore Guise., M.D.DATE OF BIRTH:  1926/05/01   DATE OF PROCEDURE:  02/24/2005  DATE OF DISCHARGE:                              CARDIAC CATHETERIZATION   INDICATIONS FOR PROCEDURE:  Increasing exertional chest pain.   DESCRIPTION OF PROCEDURE:  The patient was brought to the cardiac  catheterization lab after appropriate informed consent.  She was prepped and  draped in the sterile fashion.  Approximately 3 mL of Carbocaine were used  for local anesthesia.  A 4-French sheath placed in the right femoral artery  without difficulty.  Coronary angiogram, LV angiogram, aortic root angiogram  were then performed.   FINDINGS:  1.  Left main: No significant disease.  Mild calcification noted.  2.  LAD: Proximal to mid eccentric 40% stenosis with diffuse mid and distal      luminal irregularities.  3.  V1 and V2: Moderate-sized vessels with mild luminal irregularity.  4.  LCX: Large vessel, mild to moderate luminal irregularities.  5.  OM1, OM2, OM3: Moderate-sized vessel with luminal irregularities.  Left      system does given good left to right collaterals filling the distal and      mid RCA seen on multiple views.  6.  RCA: Dominant, proximally occluded.  The mid and distal vessel fills      well via left to right collaterals.  7.  LV: EF is 60-65%.  No wall motion abnormalities.  LVEDP is 13 mmHg.   IMPRESSION:  1.  Single-vessel obstructive coronary disease with good left to right      collaterals noted.  2.  Nonobstructive coronary disease on her left anterior descending and      circumflex arteries.  3.  Hyperdynamic left ventricular function with an ejection fraction of 60-      65%.   PLAN:  Aggressive medical therapy and risk factor modification.      TWK/MEDQ  D:   02/24/2005  T:  02/24/2005  Job:  [id number]   cc:   Jeannett Senior A. Evlyn Kanner, M.D.  [address]  [place]  [place] [address]  Fax: [phone]

## 2011-04-01 NOTE — H&P (Signed)
NAME:  [name], [name]                   ACCOUNT NO.:  [id number]   MEDICAL RECORD NO.:  [id number]          PATIENT TYPE:  AMB   LOCATION:                               FACILITY:  [facility]   PHYSICIAN:  Elmore Guise., M.D.DATE OF BIRTH:  24-Apr-1926   DATE OF ADMISSION:  02/24/2005  DATE OF DISCHARGE:                                HISTORY & PHYSICAL   REFERRED BY:  Tera Mater. Evlyn Kanner, M.D., [facility].   CHIEF COMPLAINT:  Increasing exertional chest pain, multiple cardiac risk  factors.   HISTORY OF PRESENT ILLNESS:  The patient is a very pleasant 77 year old  white female with a past medical history of hypertension, breast cancer,  diabetes mellitus, dyslipidemia and osteoarthritis, who presents for  increasing exertional chest pain.  The patient was initially seen on  October 11, 2004, for evaluation of right-sided chest pain.  At that time,  she was given nitroglycerin and was told to return if her symptoms  continued, for a stress test.  Following that visit she was diagnosed with  shingles, and noticed significant improvement in her chest pain and chronic  symptoms; however, over the last three weeks she now reports a new pain.  She describes chest tightness that occurs with exertion, improved with rest  or nitroglycerin.  She describes it as a retrosternal tightness squeezing  which goes to her left arm, associated with shortness of breath and mild  nausea, very slight diaphoresis.  She has been evaluated by EMS once and  refused hospital admission because her electrocardiogram did not show  anything.  Her symptoms now seem to be coming on more frequently, as well  as with less exertion.  We did discuss the possibility of a stress test,  versus a cardiac catheterization, and the patient prefers an invasive  workup, secondary to worsening of symptoms.   CURRENT MEDICATIONS:  1.  Topamax 100 mg q.i.d.  2.  Arimidex 1 mg daily.  3.  Altace 10 mg b.i.d.  4.  Colestid 7.5 g  daily.  5.  Diovan 160 mg daily.  6.  Lasix 40 mg daily.  7.  Potassium 100 mEq daily.  8.  Inderal 160 mg daily.  9.  Novolog 70/30, 15 units q.a.m., 18 units at night.  10. MetroGel p.r.n.  11. Gabapentin 300 mg t.i.d.  12. Trazodone 150 mg daily.  13. Vytorin 10/20 mg daily.  14. Aleve one to two pills daily as needed.  15. Glucosamine/chondroitin t.i.d.  16. A multivitamin once daily.  17. Doxycycline daily.  18. Clonazepam 0.5 mg, two q.h.s.   ALLERGIES:  LIDOCAINE, XYLOCAINE AND NOVOCAIN, all causing anaphylactic  shock.  Also allergic to PENICILLIN, BEE STINGS, ADHESIVE TAPE AND CELEBREX.   PHYSICAL EXAMINATION:  VITAL SIGNS:  Weight 214 pounds, blood pressure  116/76, heart rate 76 and regular.  GENERAL:  She is a very pleasant, elderly white female, alert and oriented  x4, in no acute distress.  NECK:  Supple, no lymphadenopathy, with 2+ carotids.  No jugular venous  distention.  No bruits.  LUNGS:  Clear.  HEART:  Regular with a normal S1 and S2.  A 2/6 systolic ejection murmur.  ABDOMEN:  Obese, soft, nontender, nondistended.  EXTREMITIES:  Warm with 2+ pulses.  No edema.  Right femoral has 2+ pulse.  No bruit noted.   A recent echocardiogram done on October 12, 2004, showed an ejection  fraction of 55%-60% with mitral valve prolapse of the anterior mitral valve  leaflet, a trace of mitral regurgitation, a trace of tricuspid  regurgitation, trace PI.  No wall motion abnormalities.  The patient was  instructed to have SBE prophylaxis prior to any dental, GI, or GU  procedure.   LABORATORY DATA:  Today is pending including a CBC, BMP and coag level.   IMPRESSION:  1.  Increasing substernal chest pain.  2.  Multiple cardiac risk factors, including hypertension, diabetes,      dyslipidemia, age and family history.  The patient does have a family      history with a daughter with heart disease starting at age 66, otherwise      a stroke runs in the family.    PLAN:  1.  I did discuss a cardiac catheterization, versus non-invasive workup.      The patient elects for a cardiac catheterization at this time.  2.  She will begin aspirin 81 mg daily, to help with her symptoms, as well      as use nitroglycerin as needed.  3.  Because of her multiple allergies, the patient will be given prednisone      60 mg on the evening before her procedure, as well as the morning of her      procedure.  We will not use lidocaine or Xylocaine, because of her      history of anaphylaxis.  I did discuss the risks and benefits with her      at length, and she elected to proceed.  The procedure will be scheduled      for the a.m. on February 24, 2005.      TWK/MEDQ  D:  02/21/2005  T:  02/21/2005  Job:  [id number]   cc:   Jeannett Senior A. Evlyn Kanner, M.D.  [address]  [place]  [place] [address]  Fax: [phone]

## 2011-04-01 NOTE — Consult Note (Signed)
NAME:  [name], [name]                           ACCOUNT NO.:  [id number]   MEDICAL RECORD NO.:  [id number]                   PATIENT TYPE:  REC   LOCATION:  FOOT                                 FACILITY:  [facility]   PHYSICIAN:  Jonelle Sports. Sevier, M.D.              DATE OF BIRTH:  [birth year]   DATE OF CONSULTATION:  07/10/2003  DATE OF DISCHARGE:                                   CONSULTATION   REFERRING PHYSICIAN:  Tera Mater. Evlyn Kanner, M.D.   HISTORY:  This 77 year old white female is referred through the courtesy of  Jeannett Senior A. [place], M.D. for assistance with management of ulceration on the  tip of the left second toe.   The patient has had type 2 diabetes since 1988 and apparently has been in  good control with most recent A1C of 6.7%.  She has had some minor  neuropathic symptoms in her feet, but has not had prior difficulty with  ulcers or significant calluses.   Beginning some three weeks ago, however, the patient noted without  recognized trauma, the development of an open ulceration on the tip of the  left second toe which has a hammer toe configuration.  The toe quickly  swelled and became quite inflamed and she consulted Jeannett Senior A. Saint Martin, M.D.  who treated her initially with [facility] and then switched her to [facility] which  she continues to the present.  Presumed dose 200 mg b.i.d.  She has had  improvement of the swelling and discoloration with this, but feels her  course persists and she is referred now for our evaluation and advice.   PAST MEDICAL HISTORY:  1. Hypertension.  2. Degenerative arthritis.  3. Known heart murmur.   ALLERGIES:  LIDOCAINE, PENICILLIN, BEE STINGS.   REGULAR MEDICATIONS:  1. Humalog 70/30 b.i.d.  2. Armidex.  3. Altace.  4. Diovan.  5. Lasix.  6. Potassium.  7. Toprol XL.  8. Topamax.  9. ___________.  10.      Minocycline.  11.      Neurontin.  12.      MetroGel.  13.      Colestid.  14.      Zetia.  15.      Gemfibrozil.  16.       Ambien.   PHYSICAL EXAMINATION:  EXTREMITIES:  Examination today is limited to the  distal lower extremities.  The feet are free of gross edema, although she  does have evidence for chronic venous insufficiency with rather striking  discolored stasis dermatitis of the distal lower legs bilaterally.  Skin  temperatures are upper normal with a slight increase on the left as compared  with the right.  Pulses are palpable and on handheld Doppler she has a  biphasic pulse at the posterior tibial location on the left and a triphasic  pulse at the dorsalis pedis position on that side.  Monofilament testing  shows that she lacks protective sensation throughout both feet.   The lesser toes do show, indeed, a hammer toe configuration bilaterally and  on the tip of the left second toe is an open ulcer measuring 9 x 11 mm with  a granular red base.  There is some peeling of the adjacent skin indicative  of prior inflammation, but appears to be no active inflammation at the  moment.   IMPRESSION:  Neuropathic ulcer, tip left second toe.   DISPOSITION:  1. The patient is given instruction regarding foot care and diabetes by     video with nurse and physician reinforcement.  2. The peeling skin and some crust around the margins of the ulcer are     debrided without incident.  3. The ulcer is dressed with Neosporin and a protective padded     circumferential dressing.  4. The patient is placed in a Darco platform walker on that side and is     given a _____ to place beneath the second, third, and fourth toes all in     an effort to relieve all pressure from that toe.  5. She is trained in proper technique for walking in the Darco healing     sandal.  She is advised that this may lead to problems in knees or hips     either ipsilateral and contralateral and if such happens she is advised     to consult [place] and we will arrange for her to have her contralateral shoe     elevated similarly.  6. The  patient is to continue the Vantin for one additional week.  7. She is to cleanse the ulcer gently on a daily basis, dry it well, and     redress it with Neosporin and protective dressing.  8. Her follow-up visit is to be here in two weeks, sooner if there are     problems.                                               Jonelle Sports. Cheryll Cockayne, M.D.    RES/MEDQ  D:  07/10/2003  T:  07/11/2003  Job:  [id number]   cc:   Jeannett Senior A. Evlyn Kanner, M.D.  [address]  [place]  [place] [address]  Fax: [phone]

## 2011-04-01 NOTE — Discharge Summary (Signed)
NAMEMarland Bruce  TANGANYIKA, BOWLDS NO.:  [id number]   MEDICAL RECORD NO.:  [id number]          PATIENT TYPE:  INP   LOCATION:  3714                         FACILITY:  [facility]   PHYSICIAN:  Elmore Guise., M.D.DATE OF BIRTH:  Feb 21, 1926   DATE OF ADMISSION:  05/17/2005  DATE OF DISCHARGE:  05/18/2005                                 DISCHARGE SUMMARY   DISCHARGE DIAGNOSIS:  1.  Atypical chest pain.  2.  Hypertension.  3.  Diabetes mellitus.  4.  History of known single vessel coronary disease with proximally occluded      right coronary artery filling via good left-to-right collaterals      (patient has luminal irregularities and non-obstructive disease in her      left system).  5.  Dyslipidemia.  6.  History of breast cancer.  7.  History of rosacea.   HISTORY OF PRESENT ILLNESS:  The patient is a very pleasant 77 year old  white female who was admitted with substernal chest pain.  Patient took  three sublingual nitroglycerin without any relief of pain.  She came to the  emergency department for further evaluation.   HOSPITAL COURSE:  Patient's initial ECG and cardiac enzymes were negative.  She was admitted for observation.  She did have relief after morphine.  She  did continue to get sublingual nitroglycerin without any relief at all.  She  was also placed on nitroglycerin drip with no significant changes in her  pain.  She has now been up and ambulatory without any further problems.  She  has had no further chest pain.  Her cardiac enzymes have remained negative.  She has had four troponins which were less than 0.04 as well as negative  myoglobins and negative CPK with a peak CPK of 47.  She will be discharged  home today to continue the following medications.   MEDICATIONS:  1.  Arimidex 1 mg daily.  2.  Altace 10 mg p.o. b.i.d.  3.  Clonazepam 0.5 mg two pills daily.  4.  Colestid 7.5 g daily.  5.  Diovan 160 mg daily.  6.  Lasix 40 mg daily.  7.  KCl 20  mEq five pills daily.  8.  Imdur 60 mg daily (this is a new dose for her).  9.  Inderal LA 160 mg daily.  10. 70/30 insulin 13 units in the morning, 18 units in the evening.  11. Metronidazole gel 0.75% gel daily to affected area.  12. Doxycycline 100 mg daily.  13. Gabapentin 300 mg three times daily.  14. Plavix 75 mg daily.  15. Topamax 100 mg four  pills daily.  16. Trazodone 150 mg daily.  17. Vytorin 10/20 mg daily.  18. Nitroglycerin p.r.n. (all medications are as before except increased      dose of Imdur).   The patient will return for routine visit with Dr. Reyes Ivan at [facility]  cardiology in two to three weeks.  This has already been scheduled.  She  will call office with any questions or concerns.  She had been doing well  with  low doses of Imdur with her daily chest pain.  This is the anniversary  of the death of her son and she feels a psychological component is involved  in her symptoms.  Because Imdur had worked well in the past an increased  dose will be given initially to taper down on her next office visit.       TWK/MEDQ  D:  05/18/2005  T:  05/18/2005  Job:  [id number]   cc:   Jeannett Senior A. Evlyn Kanner, M.D.  [address]  [place]  [place] [address]  Fax: [phone]

## 2011-04-01 NOTE — H&P (Signed)
NAME:  [name], [name] NO.:  [id number]   MEDICAL RECORD NO.:  [id number]          PATIENT TYPE:  EMS   LOCATION:  MAJO                         FACILITY:  [facility]   PHYSICIAN:  Darden Palmer., M.D.DATE OF BIRTH:  1926-04-29   DATE OF ADMISSION:  05/17/2005  DATE OF DISCHARGE:                                HISTORY & PHYSICAL   REASON FOR ADMISSION:  This 77 year old female has a history of long  standing diabetes, hypertension, hyperlipidemia and a remote history of  breast cancer.  She evidently developed chest pain within the past year and  initially was diagnosed with shingles.  Because of continued chest pain, saw  Dr. Lady Deutscher.  By history, she had a cardiac catheterization showing  some blockage several months ago, but no record of this can be found within  the medical record system at the hospital currently in terms of the e-chart.  She evidently was told that she had a natural bypass and was treated  medically.  This morning around 9 a.m. after doing some cleaning, she had  the onset of left-sided chest discomfort described as pressure and heaviness  that increased in intensity and was not pleuritic and not associated with  good or change in body position.  She took three nitroglycerin without  relief and EMS was called, who gave her additional nitroglycerin and she was  presented to the emergency room.  Initial cardiac enzymes were negative and  her pain gradually went away and lasted a total of around 3-4 hours.  She  has no history of PND or orthopnea, syncope or claudication.   PAST MEDICAL HISTORY:  1.  Long standing hypertension.  2.  Severe degenerative arthritis with previous history of diabetes as noted      above.  3.  History of breast cancer treated with Arimidex, radiation therapy and      surgery.  4.  Benign familial tremor.  5.  Diverticulosis.   PAST SURGICAL HISTORY:  1.  Three natural childbirths.  2.  Repair of a cervical  spine fracture.  3.  Patellar surgery.  4.  Right breast surgery for breast cancer.  5.  Previous shoulder surgery.  6.  Multiple hand surgeries previously.   ALLERGIES:  1.  LIDOCAINE.  2.  XYLOCAINE.  3.  NOVOCAIN.  4.  PENICILLIN.  5.  BEE STINGS.  6.  ADHESIVE TAPE.  7.  CELEBREX.   She does not state allergy to Carbocaine.   CURRENT MEDICATIONS:  Very complex.  She brings in a list of medications  showing:  1.  Klor-Con 20 mEq, 2 at breakfast, one at lunch, supper and bedtime.  2.  Arimidex 1 mg daily.  3.  Furosemide 40 mg daily.  4.  Altace 20 mg daily.  5.  Clonazepam 0.5 mg daily.  6.  Doxycycline 100 mg daily.  7.  Imdur 30 mg daily.  8.  Inderal LA 160 mg daily.  9.  Naprosyn, 2 tablets for breakfast.  10. Plavix 75 mg daily.  11. Diovan 160 mg daily.  12.  Glucosamine chondroitin 500/400 mg with breakfast, lunch and supper.  13. Topamax 200 mg at breakfast, 100 mg at lunch and supper.  14. Vytorin 10/20 mg at bedtime.  15. Neurontin 300 mg t.i.d. with lunch, supper and bedtime.  16. Trazodone 150 mg at bedtime.  17. Synthroid of uncertain dose.  18. 70/30 Humalog insulin, 15 units at breakfast and 18 units at supper.   FAMILY HISTORY:  As recorded in old records and is unchanged.   SOCIAL HISTORY:  She currently lives with her husband at [facility].  She is a nonsmoker and does not use alcohol to excess.   REVIEW OF SYSTEMS:  She has been obese for many years.  She has a chronic  skin problem, treated with antibiotics and may have rosacea.  She wears eye  glasses.  No definite diabetic retinopathy.  She has no asthma, cough or  wheezing.  No difficulty swallowing.  She has a remote history of an ulcer.  She has had diverticulosis in the past.  She has urinary frequency.  She has  significant severe arthritis and also has previous difficulty with foot  ulcerations that have been treated at the foot clinic.  She has no history  of stroke or TIA.  She  does have peripheral neuropathy, treated with  Neurontin.  Other than as noted above, the remainder of the review of  systems is unremarkable.   PHYSICAL EXAMINATION:  GENERAL:  She is an elderly female, who is a  rambling, poor historian.  VITAL SIGNS:  Her blood pressure is currently 140/70, pulse is 60 and  regular.  SKIN:  Warm and dry.  HEENT:  EOMI, PERRLA.  CNS clear.  Fundi are unremarkable.  Pharynx  negative.  NECK:  Supple, without masses, JVD, thyromegaly or bruits.  LUNGS:  Clear to auscultation and percussion.  ABDOMEN:  Obese, soft and nontender.  No masses, hepatosplenomegaly or  aneurysm.  EXTREMITIES:  Femoral pulses 2+, distal pulses 2+.  There is no edema noted.  Decreased sensation in feet.   EKG is normal.   Initial cardiac enzymes were negative.   IMPRESSION:  1.  Prolonged chest discomfort, rule out unstable angina.  2.  History of catheterization previously, but cannot find catheterization      report and results not immediately available.  3.  Diabetes mellitus, insulin dependent with neuropathy.  4.  Hypertension.  5.  Hyperlipidemia.  6.  Diverticulosis.  7.  History of breast cancer, treated with lumpectomy and radiation therapy,      as well as Arimidex.  8.  Hypothyroidism under treatment.  9.  Anxiety.   RECOMMENDATIONS:  1.  The patient is placed on observation and will be placed on Lovenox and      have serial cardiac enzymes.  2.  If recurrent pain, begin IV nitroglycerin.       WST/MEDQ  D:  05/17/2005  T:  05/17/2005  Job:  [id number]   cc:   Elmore Guise., M.D.  [address]  [place], [place] [address]  Fax: [phone]   Tera Mater. Evlyn Kanner, M.D.  [address]  [place]  [place] [address]  Fax: [phone]

## 2011-05-30 ENCOUNTER — Ambulatory Visit (HOSPITAL_COMMUNITY)
Admission: RE | Admit: 2011-05-30 | Discharge: 2011-05-30 | Disposition: A | Discharge status: Home / Self Care | Payer: Medicare Other | Source: Ambulatory Visit | Attending: Internal Medicine | Admitting: Internal Medicine

## 2011-05-30 ENCOUNTER — Other Ambulatory Visit: Payer: Self-pay | Admitting: Internal Medicine

## 2011-05-30 DIAGNOSIS — M25519 Pain in unspecified shoulder: Secondary | ICD-10-CM | POA: Insufficient documentation

## 2011-05-30 DIAGNOSIS — M19019 Primary osteoarthritis, unspecified shoulder: Secondary | ICD-10-CM | POA: Insufficient documentation

## 2011-05-30 DIAGNOSIS — R0789 Other chest pain: Secondary | ICD-10-CM | POA: Insufficient documentation

## 2011-05-30 DIAGNOSIS — R52 Pain, unspecified: Secondary | ICD-10-CM

## 2011-05-30 DIAGNOSIS — M25559 Pain in unspecified hip: Secondary | ICD-10-CM | POA: Insufficient documentation

## 2011-05-30 DIAGNOSIS — M549 Dorsalgia, unspecified: Secondary | ICD-10-CM | POA: Insufficient documentation

## 2011-06-10 ENCOUNTER — Emergency Department (HOSPITAL_COMMUNITY)
Admission: EM | Admit: 2011-06-10 | Discharge: 2011-06-10 | Disposition: A | Discharge status: Home / Self Care | Payer: Medicare Other | Attending: Emergency Medicine | Admitting: Emergency Medicine

## 2011-06-10 ENCOUNTER — Emergency Department (HOSPITAL_COMMUNITY): Payer: Medicare Other

## 2011-06-10 DIAGNOSIS — Z8673 Personal history of transient ischemic attack (TIA), and cerebral infarction without residual deficits: Secondary | ICD-10-CM | POA: Insufficient documentation

## 2011-06-10 DIAGNOSIS — I2510 Atherosclerotic heart disease of native coronary artery without angina pectoris: Secondary | ICD-10-CM | POA: Insufficient documentation

## 2011-06-10 DIAGNOSIS — Z853 Personal history of malignant neoplasm of breast: Secondary | ICD-10-CM | POA: Insufficient documentation

## 2011-06-10 DIAGNOSIS — Z79899 Other long term (current) drug therapy: Secondary | ICD-10-CM | POA: Insufficient documentation

## 2011-06-10 DIAGNOSIS — I10 Essential (primary) hypertension: Secondary | ICD-10-CM | POA: Insufficient documentation

## 2011-06-10 DIAGNOSIS — R079 Chest pain, unspecified: Secondary | ICD-10-CM | POA: Insufficient documentation

## 2011-06-10 DIAGNOSIS — R071 Chest pain on breathing: Secondary | ICD-10-CM | POA: Insufficient documentation

## 2011-06-10 DIAGNOSIS — E119 Type 2 diabetes mellitus without complications: Secondary | ICD-10-CM | POA: Insufficient documentation

## 2011-06-10 DIAGNOSIS — L989 Disorder of the skin and subcutaneous tissue, unspecified: Secondary | ICD-10-CM | POA: Insufficient documentation

## 2011-06-10 DIAGNOSIS — Z794 Long term (current) use of insulin: Secondary | ICD-10-CM | POA: Insufficient documentation

## 2011-06-10 LAB — URINALYSIS, ROUTINE W REFLEX MICROSCOPIC
Glucose, UA: NEGATIVE mg/dL
Specific Gravity, Urine: 1.018 (ref 1.005–1.030)
pH: 5.5 (ref 5.0–8.0)

## 2011-06-10 LAB — DIFFERENTIAL
Basophils Absolute: 0 10*3/uL (ref 0.0–0.1)
Basophils Relative: 0 % (ref 0–1)
Eosinophils Absolute: 0.1 10*3/uL (ref 0.0–0.7)
Eosinophils Relative: 0 % (ref 0–5)
Lymphocytes Relative: 5 % — ABNORMAL LOW (ref 12–46)
Monocytes Absolute: 0.7 10*3/uL (ref 0.1–1.0)

## 2011-06-10 LAB — CBC
MCHC: 34.3 g/dL (ref 30.0–36.0)
Platelets: 228 10*3/uL (ref 150–400)
RDW: 12.9 % (ref 11.5–15.5)
WBC: 14.3 10*3/uL — ABNORMAL HIGH (ref 4.0–10.5)

## 2011-06-10 LAB — COMPREHENSIVE METABOLIC PANEL
AST: 20 U/L (ref 0–37)
Albumin: 3.9 g/dL (ref 3.5–5.2)
Alkaline Phosphatase: 112 U/L (ref 39–117)
Chloride: 92 mEq/L — ABNORMAL LOW (ref 96–112)
Potassium: 4.7 mEq/L (ref 3.5–5.1)
Sodium: 131 mEq/L — ABNORMAL LOW (ref 135–145)
Total Bilirubin: 0.6 mg/dL (ref 0.3–1.2)
Total Protein: 8.3 g/dL (ref 6.0–8.3)

## 2011-06-10 LAB — URINE MICROSCOPIC-ADD ON

## 2011-06-10 LAB — LIPASE, BLOOD: Lipase: 16 U/L (ref 11–59)

## 2011-07-13 ENCOUNTER — Encounter (INDEPENDENT_AMBULATORY_CARE_PROVIDER_SITE_OTHER): Payer: Self-pay | Admitting: General Surgery

## 2011-07-13 DIAGNOSIS — Z853 Personal history of malignant neoplasm of breast: Secondary | ICD-10-CM | POA: Insufficient documentation

## 2011-07-14 ENCOUNTER — Encounter (INDEPENDENT_AMBULATORY_CARE_PROVIDER_SITE_OTHER): Payer: Self-pay | Admitting: Surgery

## 2011-07-15 ENCOUNTER — Ambulatory Visit (INDEPENDENT_AMBULATORY_CARE_PROVIDER_SITE_OTHER): Payer: Medicare Other | Admitting: Surgery

## 2011-07-15 ENCOUNTER — Encounter (INDEPENDENT_AMBULATORY_CARE_PROVIDER_SITE_OTHER): Payer: Self-pay | Admitting: Surgery

## 2011-07-15 VITALS — BP 128/70 | HR 72

## 2011-07-15 DIAGNOSIS — L97509 Non-pressure chronic ulcer of other part of unspecified foot with unspecified severity: Secondary | ICD-10-CM

## 2011-07-15 DIAGNOSIS — Z853 Personal history of malignant neoplasm of breast: Secondary | ICD-10-CM

## 2011-07-15 DIAGNOSIS — E11621 Type 2 diabetes mellitus with foot ulcer: Secondary | ICD-10-CM

## 2011-07-15 DIAGNOSIS — E1169 Type 2 diabetes mellitus with other specified complication: Secondary | ICD-10-CM

## 2011-07-15 NOTE — Patient Instructions (Signed)
Consider having another mammogram this year if your last one was over a year ago.

## 2011-07-15 NOTE — Progress Notes (Signed)
07/15/2011  [name] is a 77 y.o.female who presents for routine followup of her Multiple breast cancersdiagnosed in 2001,2009,2011 and treated with lumpectomies, then Right mastectomy. She has no problems or concerns on either side.  PFSH She has had no significant changes since the last visit here.  ROS She has developed some diabetic foot ulcers [facility]. She asked me to check these because she was concerned about the treatment.  General: The patient is alert, oriented, generally healty appearing, NAD. Mood and affect are normal.  Breasts: The right breast is surgically absent. There is no evidence of recurrence or new problems.  Left breast is normal. There is no evidence of a recurrent cancer. Lymphatics: She has no axillary or supraclavicular adenopathy on either side.  Extremities: Full ROM of the surgical side with no lymphedema noted.She has what appears to be a diabetic toe ulceration of the tip of the left second toe. There is also a little bit of ulceration at the base. The dorsum of the foot and the plantar aspect of the foot appear okay. There is not appear to be any active infection. It appears local wound care is being done appropriately.  Data Reviewed:I looked over the notes from her medical physicians. Her last mammogram appears to be in March of 11 and may need to be repeated.  Impression: Doing well, with no evidence of recurrent cancer or new cancerDiabetic foot ulceration being treated appropriately  Plan: Will continue to follow up on an annual basis here.

## 2011-08-04 LAB — DIFFERENTIAL
Basophils Absolute: 0.1
Basophils Relative: 1
Basophils Relative: 1
Eosinophils Absolute: 0.3
Eosinophils Absolute: 0.3
Eosinophils Relative: 4
Monocytes Absolute: 0.8
Monocytes Relative: 13 — ABNORMAL HIGH
Neutro Abs: 3
Neutrophils Relative %: 47

## 2011-08-04 LAB — CBC
Hemoglobin: 15.3 — ABNORMAL HIGH
MCHC: 33.4
MCHC: 33.7
MCHC: 34.2
MCV: 98.1
Platelets: 171
Platelets: 176
RBC: 4.62
RBC: 4.63
RBC: 4.64
RDW: 13.7
WBC: 6.8

## 2011-08-04 LAB — BASIC METABOLIC PANEL
BUN: 19
CO2: 30
CO2: 32
Calcium: 9
Calcium: 9.3
Creatinine, Ser: 0.85
Creatinine, Ser: 1.01
GFR calc Af Amer: >60
GFR calc Af Amer: >60
GFR calc non Af Amer: 53 — ABNORMAL LOW
Sodium: 138

## 2011-08-04 LAB — CARDIAC PANEL(CRET KIN+CKTOT+MB+TROPI)
CK, MB: 1.8
CK, MB: 1.9
Relative Index: INVALID
Troponin I: 0.01

## 2011-08-04 LAB — I-STAT 8, (EC8 V) (CONVERTED LAB)
Acid-Base Excess: 3 — ABNORMAL HIGH
Bicarbonate: 29.6 — ABNORMAL HIGH
HCT: 47 — ABNORMAL HIGH
Operator id: 294341
pCO2, Ven: 53.3 — ABNORMAL HIGH
pH, Ven: 7.353 — ABNORMAL HIGH

## 2011-08-04 LAB — COMPREHENSIVE METABOLIC PANEL
ALT: 16
AST: 20
Albumin: 3.4 — ABNORMAL LOW
Alkaline Phosphatase: 58
Glucose, Bld: 78
Potassium: 3.9
Sodium: 143
Total Protein: 5.8 — ABNORMAL LOW

## 2011-08-04 LAB — B-NATRIURETIC PEPTIDE (CONVERTED LAB)
Pro B Natriuretic peptide (BNP): 35
Pro B Natriuretic peptide (BNP): 96

## 2011-08-04 LAB — POCT CARDIAC MARKERS
Myoglobin, poc: 54.6
Myoglobin, poc: 72.8
Operator id: 133351
Operator id: 294341
Troponin i, poc: <0.05
Troponin i, poc: <0.05

## 2011-08-04 LAB — LIPID PANEL
Cholesterol: 130
HDL: 48
Triglycerides: 124

## 2011-08-04 LAB — POCT I-STAT CREATININE
Creatinine, Ser: 0.9
Operator id: 294341

## 2011-08-04 LAB — CK TOTAL AND CKMB (NOT AT ARMC)
CK, MB: 2
Total CK: 87

## 2011-08-11 LAB — BASIC METABOLIC PANEL
CO2: 29
Calcium: 9.4
Creatinine, Ser: 0.9
GFR calc Af Amer: >60
GFR calc non Af Amer: >60
Sodium: 140

## 2011-08-11 LAB — POCT HEMOGLOBIN-HEMACUE: Hemoglobin: 17.8 — ABNORMAL HIGH

## 2011-08-17 LAB — COMPREHENSIVE METABOLIC PANEL
ALT: 27
AST: 29
Albumin: 3.7
CO2: 29
Chloride: 102
GFR calc Af Amer: 56 — ABNORMAL LOW
GFR calc non Af Amer: 46 — ABNORMAL LOW
Potassium: 4.6
Sodium: 140
Total Bilirubin: 0.7

## 2011-08-17 LAB — GLUCOSE, CAPILLARY
Glucose-Capillary: 144 — ABNORMAL HIGH
Glucose-Capillary: 145 — ABNORMAL HIGH
Glucose-Capillary: 154 — ABNORMAL HIGH
Glucose-Capillary: 320 — ABNORMAL HIGH

## 2011-08-17 LAB — URINALYSIS, ROUTINE W REFLEX MICROSCOPIC
Bilirubin Urine: NEGATIVE
Ketones, ur: NEGATIVE
Nitrite: NEGATIVE
Protein, ur: NEGATIVE
Urobilinogen, UA: 0.2

## 2011-08-17 LAB — DIFFERENTIAL
Basophils Absolute: 0.1
Basophils Relative: 1
Neutro Abs: 4.7
Neutrophils Relative %: 56

## 2011-08-17 LAB — URINE MICROSCOPIC-ADD ON

## 2011-08-17 LAB — PROTIME-INR
INR: 1
Prothrombin Time: 13.3

## 2011-08-17 LAB — APTT: aPTT: 29

## 2011-08-17 LAB — CBC
Platelets: 152
RBC: 4.76
WBC: 8.4

## 2011-08-24 LAB — POCT I-STAT CREATININE
Creatinine, Ser: 0.9
Operator id: 257131

## 2011-08-24 LAB — DIFFERENTIAL
Eosinophils Relative: 1
Eosinophils Relative: 2
Lymphocytes Relative: 22
Lymphocytes Relative: 24
Lymphs Abs: 1.4
Lymphs Abs: 1.5
Monocytes Absolute: 0.9 — ABNORMAL HIGH
Neutro Abs: 3.8

## 2011-08-24 LAB — COMPREHENSIVE METABOLIC PANEL
AST: 24
Albumin: 2.7 — ABNORMAL LOW
Calcium: 8.2 — ABNORMAL LOW
Creatinine, Ser: 0.97
GFR calc Af Amer: >60
GFR calc non Af Amer: 56 — ABNORMAL LOW

## 2011-08-24 LAB — I-STAT 8, (EC8 V) (CONVERTED LAB)
Acid-base deficit: 3 — ABNORMAL HIGH
Bicarbonate: 22.1
HCT: 57 — ABNORMAL HIGH
Hemoglobin: 19.4 — ABNORMAL HIGH
Operator id: 257131
Sodium: 138
TCO2: 23

## 2011-08-24 LAB — BASIC METABOLIC PANEL
Chloride: 108
Creatinine, Ser: 0.88
GFR calc Af Amer: >60
GFR calc non Af Amer: >60

## 2011-08-24 LAB — CBC
HCT: 51.3 — ABNORMAL HIGH
Hemoglobin: 15.6 — ABNORMAL HIGH
MCHC: 34.1
MCV: 96.8
Platelets: 206
Platelets: 226
RDW: 13.3
WBC: 5.9
WBC: 6.6

## 2011-08-24 LAB — TYPE AND SCREEN: ABO/RH(D): B POS

## 2011-08-24 LAB — OCCULT BLOOD X 1 CARD TO LAB, STOOL: Fecal Occult Bld: POSITIVE

## 2011-08-24 LAB — ABO/RH: ABO/RH(D): B POS

## 2011-10-13 ENCOUNTER — Encounter (HOSPITAL_BASED_OUTPATIENT_CLINIC_OR_DEPARTMENT_OTHER): Payer: Medicare Other | Attending: Internal Medicine

## 2011-10-13 DIAGNOSIS — C50919 Malignant neoplasm of unspecified site of unspecified female breast: Secondary | ICD-10-CM | POA: Insufficient documentation

## 2011-10-13 DIAGNOSIS — I872 Venous insufficiency (chronic) (peripheral): Secondary | ICD-10-CM | POA: Insufficient documentation

## 2011-10-13 DIAGNOSIS — N189 Chronic kidney disease, unspecified: Secondary | ICD-10-CM | POA: Insufficient documentation

## 2011-10-13 DIAGNOSIS — L89309 Pressure ulcer of unspecified buttock, unspecified stage: Secondary | ICD-10-CM | POA: Insufficient documentation

## 2011-10-13 DIAGNOSIS — I129 Hypertensive chronic kidney disease with stage 1 through stage 4 chronic kidney disease, or unspecified chronic kidney disease: Secondary | ICD-10-CM | POA: Insufficient documentation

## 2011-10-13 DIAGNOSIS — L8992 Pressure ulcer of unspecified site, stage 2: Secondary | ICD-10-CM | POA: Insufficient documentation

## 2011-10-13 DIAGNOSIS — L97509 Non-pressure chronic ulcer of other part of unspecified foot with unspecified severity: Secondary | ICD-10-CM | POA: Insufficient documentation

## 2011-10-13 DIAGNOSIS — E785 Hyperlipidemia, unspecified: Secondary | ICD-10-CM | POA: Insufficient documentation

## 2011-10-13 DIAGNOSIS — M79609 Pain in unspecified limb: Secondary | ICD-10-CM | POA: Insufficient documentation

## 2011-10-13 NOTE — Progress Notes (Signed)
[facility]  NAME:  [name], [name]                 ACCOUNT NO.:  [id number]  MEDICAL RECORD NO.:  [id number]      DATE OF BIRTH:  May 24, 1926  PHYSICIAN:  Maxwell Caul, M.D. VISIT DATE:  10/13/2011                                  OFFICE VISIT   [name] is an 77 year old woman who lives in the skilled section of the [facility].  She is here for our review of lower extremity, predominantly left foot wounds as well as a wound on her right buttock.  I do not have much information from the patient on this, however, all of the involved areas seemed to be in the records from [facility] at [facility] as the notes that accompanies her seems to suggest. The patient states that she has severe pain in the left leg, which she variably rates as a 5-7/10, mostly her left foot.  She also tells me that she sleeps in a recliner chair at night due to pain in her back. Otherwise, she states she is active, up and walking during the day.  I am not exactly sure how accurate this is.  We do have a wound culture from [facility], dated October 11, 2011. This was negative for any particular pathogens.  PAST MEDICAL HISTORY:  Breast cancer, hypothyroidism, hyperlipidemia, anxiety with depression, hypertension, chronic venous insufficiency, chronic renal failure, hypothyroidism, psoriasis, osteoarthritis, lower back pain, and left foot wounds.  MEDICATION LIST:  Reviewed.  REVIEW OF SYSTEMS:  RESPIRATORY:  The patient does not complain of shortness of breath.  CARDIAC:  No chest pain or orthopnea.  GI:  No abdominal pain.  MUSCULOSKELETAL:  She complains of severe low back pain which makes it difficult for her to lie in bed at night.  She also complains of pain in her left foot and leg which she rated at 5-7/10.  PHYSICAL EXAMINATION:  VITAL SIGNS:  Her temperature is 97.5, pulse 67, respirations 18, blood pressure 111/66.  Her blood glucose is  154. GENERAL:  This is a somewhat obese woman, in no distress, except when she tries to move. RESPIRATORY:  Clear air entry. CARDIAC:  Heart sounds were normal.  No murmurs.  Her JVP was not elevated.  Peripheral pulses were felt to be palpable. EXTREMITIES:  She has severe venous stasis lymphedema with edema up the left leg to well up into her thigh.  The right leg has lesser degrees of similar change.  WOUND EXAM:  Mostly on her left foot, but also on the right buttock.  On the left foot, there is an area of her left toe, covered in callus, probably tinea.  This is a small open area on the plantar aspect of a hammertoe deformity.  In the left medial foot over a bony deformity, there is a chronic open area here which measured 0.5 x 1 x 0.2, and finally a superficial area posteriorly that may be due to weeping drainage on the posterior left leg, which measured 4.8 x 6, but it is actually very superficial.  The wound over her right buttock was surrounded by tinea infection, this measured 5 x 1.8,  is a stage II wound.  IMPRESSION:  Wegener's grade 2 wounds as noted in the  left leg.  The whole situation is complicated by a very severe venous stasis and lymphedema.  An acute or subacute deep vein thrombosis in this situation would be impossible to exclude and therefore we have asked for a venous Doppler.  The patient is certainly at risk for further venous stasis ulcerations and/or diabetic wounds.  With regards to her pressure ulcer on her buttock which is currently a stage II, for now, I just recommended DuoDERM here and I highly suggest that she not sleep in her reclining chair, which I talked to her about today although I think she was less than convinced.  We will see her back in a week's time.          ______________________________ Maxwell Caul, M.D.     MGR/MEDQ  D:  10/13/2011  T:  10/13/2011  Job:  [id number]

## 2011-10-20 ENCOUNTER — Encounter (HOSPITAL_BASED_OUTPATIENT_CLINIC_OR_DEPARTMENT_OTHER): Payer: Medicare Other | Attending: Internal Medicine

## 2011-10-20 DIAGNOSIS — I872 Venous insufficiency (chronic) (peripheral): Secondary | ICD-10-CM | POA: Insufficient documentation

## 2011-10-20 DIAGNOSIS — L97509 Non-pressure chronic ulcer of other part of unspecified foot with unspecified severity: Secondary | ICD-10-CM | POA: Insufficient documentation

## 2011-10-20 DIAGNOSIS — I890 Lymphedema, not elsewhere classified: Secondary | ICD-10-CM | POA: Insufficient documentation

## 2011-11-08 IMAGING — CR DG HIP W/ PELVIS BILAT
5 series · 5 of 5 positions shown · non-contrast
Comparison: None

CLINICAL DATA: Fell.  Bilateral hip pain.

BILATERAL HIP WITH PELVIS - 4+ VIEW

[t pelvis a.p.]
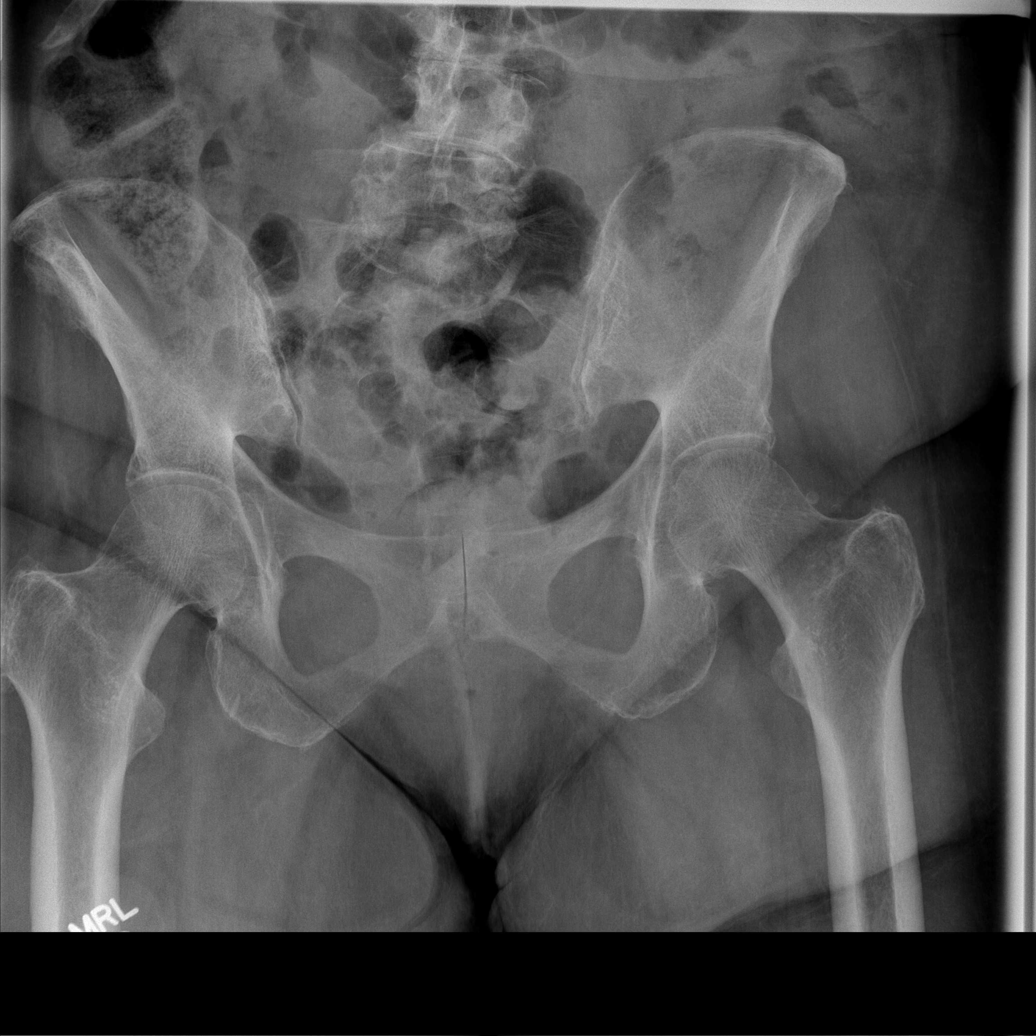

[t hip ap left]
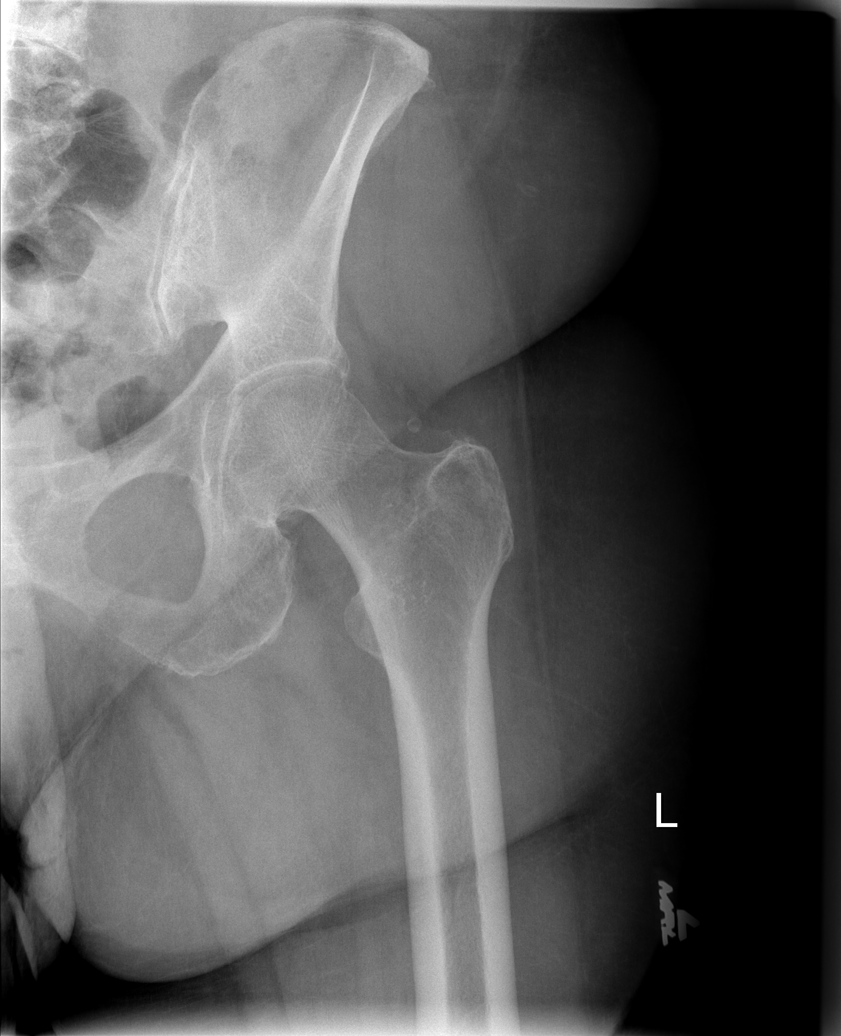

[t hip frog leg left]
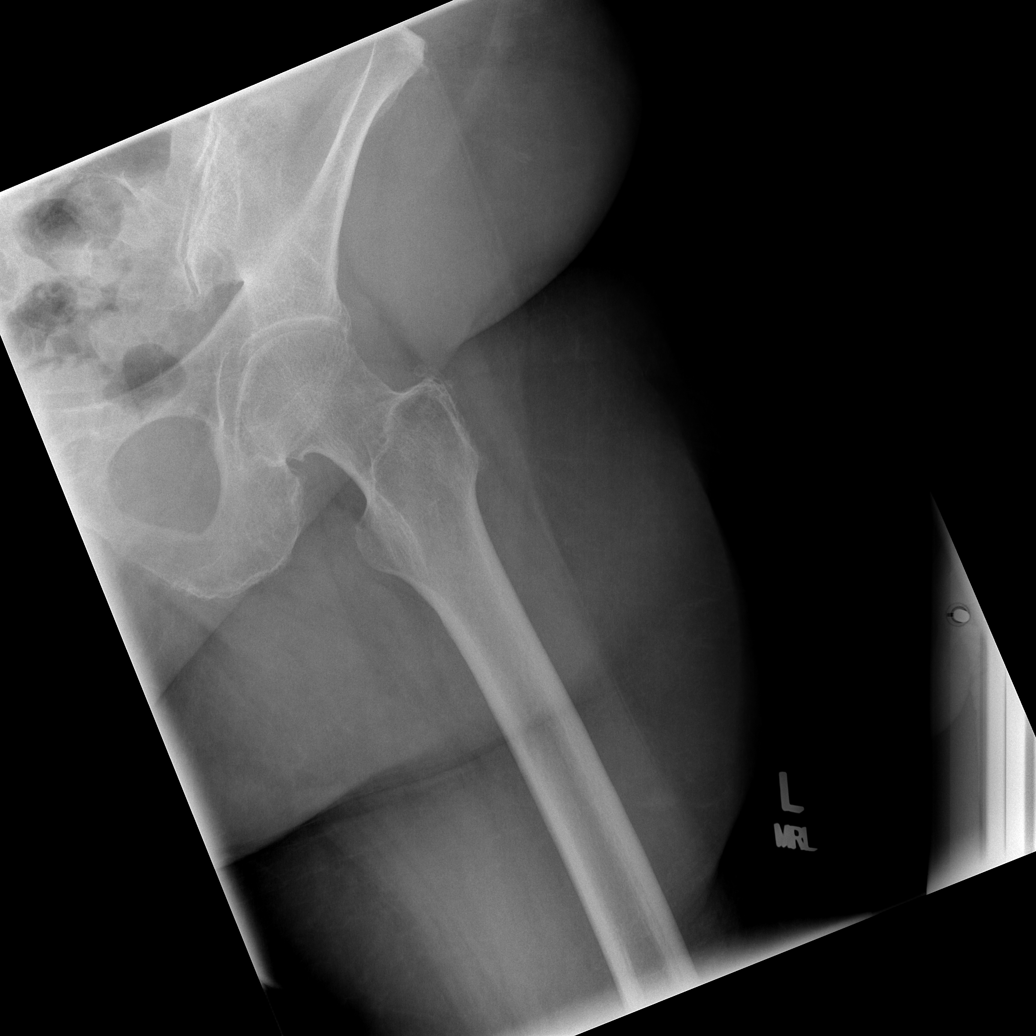

[t hip ap right]
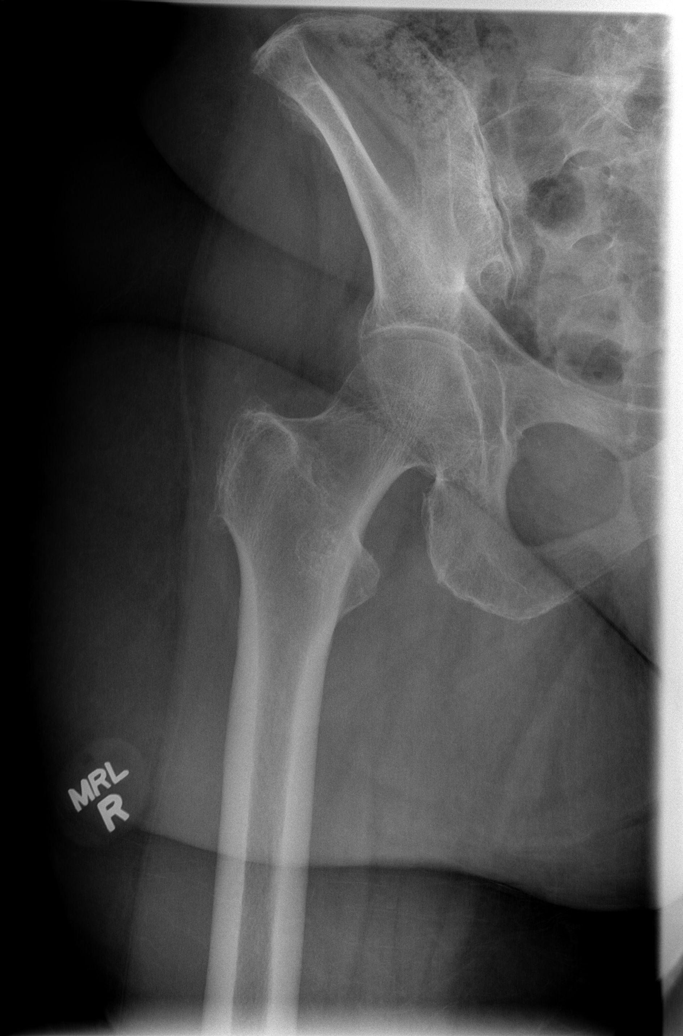

[t hip frog leg right]
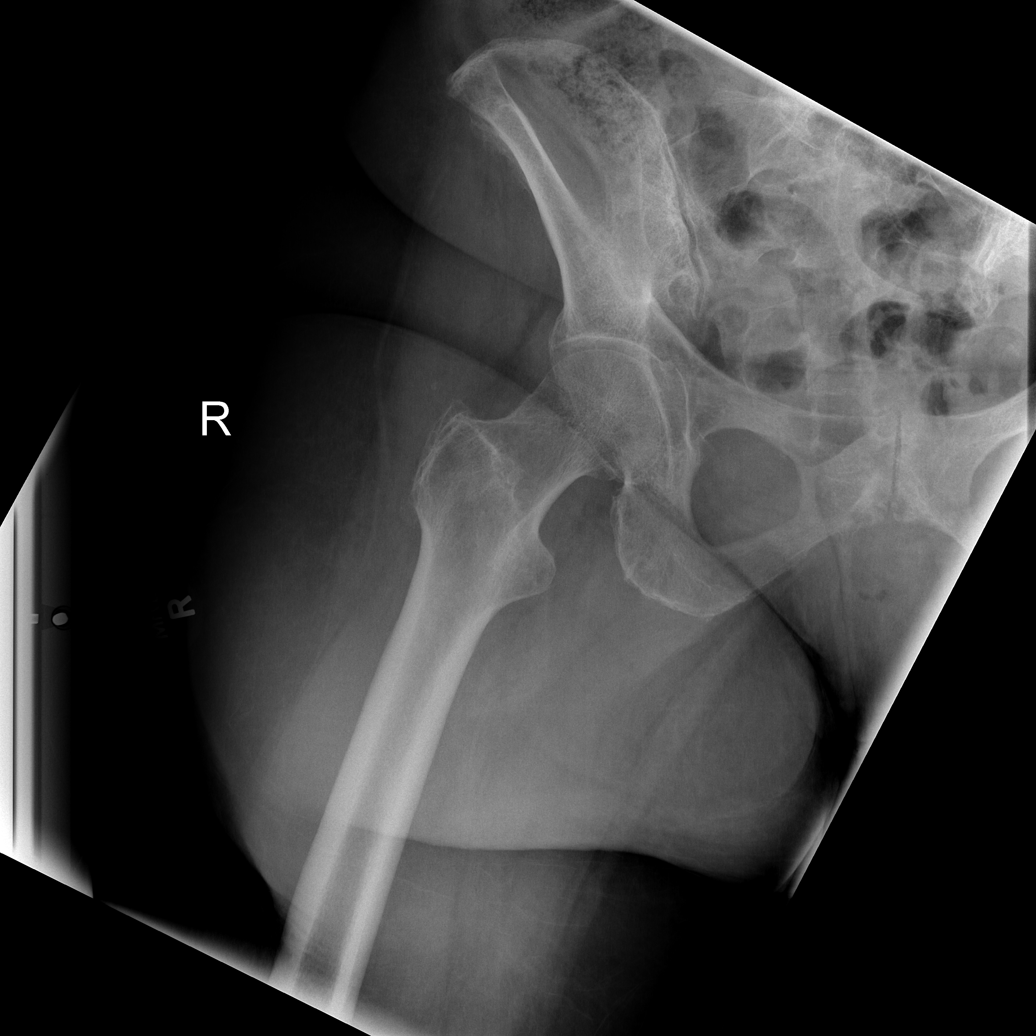

[5 of 5 positions shown; findings below may reference images not displayed]

FINDINGS: Both hips are normally located.  Mild degenerative
changes for age.  No acute fracture.  The pubic symphysis
demonstrates degenerative changes.  SI joints are intact.  No acute
pelvic fracture.
IMPRESSION: No acute bony findings.

## 2011-11-10 ENCOUNTER — Encounter (HOSPITAL_BASED_OUTPATIENT_CLINIC_OR_DEPARTMENT_OTHER): Payer: Medicare Other

## 2011-11-24 ENCOUNTER — Encounter (HOSPITAL_BASED_OUTPATIENT_CLINIC_OR_DEPARTMENT_OTHER): Payer: Medicare Other | Attending: Internal Medicine

## 2011-11-24 DIAGNOSIS — I872 Venous insufficiency (chronic) (peripheral): Secondary | ICD-10-CM | POA: Insufficient documentation

## 2011-11-24 DIAGNOSIS — Z794 Long term (current) use of insulin: Secondary | ICD-10-CM | POA: Insufficient documentation

## 2011-11-24 DIAGNOSIS — L89309 Pressure ulcer of unspecified buttock, unspecified stage: Secondary | ICD-10-CM | POA: Insufficient documentation

## 2011-11-24 DIAGNOSIS — L8990 Pressure ulcer of unspecified site, unspecified stage: Secondary | ICD-10-CM | POA: Insufficient documentation

## 2011-11-24 DIAGNOSIS — Z79899 Other long term (current) drug therapy: Secondary | ICD-10-CM | POA: Insufficient documentation

## 2011-11-24 DIAGNOSIS — I10 Essential (primary) hypertension: Secondary | ICD-10-CM | POA: Insufficient documentation

## 2011-11-24 DIAGNOSIS — E119 Type 2 diabetes mellitus without complications: Secondary | ICD-10-CM | POA: Insufficient documentation

## 2011-11-24 DIAGNOSIS — L97509 Non-pressure chronic ulcer of other part of unspecified foot with unspecified severity: Secondary | ICD-10-CM | POA: Insufficient documentation

## 2011-12-29 ENCOUNTER — Encounter (HOSPITAL_BASED_OUTPATIENT_CLINIC_OR_DEPARTMENT_OTHER): Payer: Medicare Other

## 2012-01-05 ENCOUNTER — Encounter (HOSPITAL_BASED_OUTPATIENT_CLINIC_OR_DEPARTMENT_OTHER): Payer: Medicare Other | Attending: Internal Medicine

## 2012-01-05 DIAGNOSIS — L89309 Pressure ulcer of unspecified buttock, unspecified stage: Secondary | ICD-10-CM | POA: Insufficient documentation

## 2012-01-05 DIAGNOSIS — X58XXXA Exposure to other specified factors, initial encounter: Secondary | ICD-10-CM | POA: Insufficient documentation

## 2012-01-05 DIAGNOSIS — I890 Lymphedema, not elsewhere classified: Secondary | ICD-10-CM | POA: Insufficient documentation

## 2012-01-05 DIAGNOSIS — L8992 Pressure ulcer of unspecified site, stage 2: Secondary | ICD-10-CM | POA: Insufficient documentation

## 2012-01-05 DIAGNOSIS — I872 Venous insufficiency (chronic) (peripheral): Secondary | ICD-10-CM | POA: Insufficient documentation

## 2012-01-05 DIAGNOSIS — S91109A Unspecified open wound of unspecified toe(s) without damage to nail, initial encounter: Secondary | ICD-10-CM | POA: Insufficient documentation

## 2012-01-26 ENCOUNTER — Encounter (HOSPITAL_BASED_OUTPATIENT_CLINIC_OR_DEPARTMENT_OTHER): Payer: Medicare Other | Attending: Internal Medicine

## 2012-02-23 ENCOUNTER — Encounter (HOSPITAL_BASED_OUTPATIENT_CLINIC_OR_DEPARTMENT_OTHER): Payer: Medicare Other | Attending: Internal Medicine

## 2012-03-15 ENCOUNTER — Encounter (HOSPITAL_BASED_OUTPATIENT_CLINIC_OR_DEPARTMENT_OTHER): Payer: Medicare Other | Attending: Internal Medicine

## 2012-03-15 DIAGNOSIS — L97509 Non-pressure chronic ulcer of other part of unspecified foot with unspecified severity: Secondary | ICD-10-CM | POA: Insufficient documentation

## 2012-03-15 DIAGNOSIS — L02419 Cutaneous abscess of limb, unspecified: Secondary | ICD-10-CM | POA: Insufficient documentation

## 2012-03-15 DIAGNOSIS — E1169 Type 2 diabetes mellitus with other specified complication: Secondary | ICD-10-CM | POA: Insufficient documentation

## 2012-03-15 DIAGNOSIS — I872 Venous insufficiency (chronic) (peripheral): Secondary | ICD-10-CM | POA: Insufficient documentation

## 2012-03-30 ENCOUNTER — Other Ambulatory Visit: Payer: Self-pay | Admitting: Internal Medicine

## 2012-03-30 DIAGNOSIS — L989 Disorder of the skin and subcutaneous tissue, unspecified: Secondary | ICD-10-CM

## 2012-03-30 DIAGNOSIS — R52 Pain, unspecified: Secondary | ICD-10-CM

## 2012-04-04 ENCOUNTER — Other Ambulatory Visit: Payer: Medicare Other

## 2012-04-13 ENCOUNTER — Ambulatory Visit
Admission: RE | Admit: 2012-04-13 | Discharge: 2012-04-13 | Disposition: A | Discharge status: Home / Self Care | Payer: Medicare Other | Source: Ambulatory Visit | Attending: Internal Medicine | Admitting: Internal Medicine

## 2012-04-13 DIAGNOSIS — L989 Disorder of the skin and subcutaneous tissue, unspecified: Secondary | ICD-10-CM

## 2012-04-19 ENCOUNTER — Encounter (HOSPITAL_BASED_OUTPATIENT_CLINIC_OR_DEPARTMENT_OTHER): Payer: Medicare Other | Attending: Internal Medicine

## 2012-04-19 DIAGNOSIS — L02419 Cutaneous abscess of limb, unspecified: Secondary | ICD-10-CM | POA: Insufficient documentation

## 2012-04-19 DIAGNOSIS — E1169 Type 2 diabetes mellitus with other specified complication: Secondary | ICD-10-CM | POA: Insufficient documentation

## 2012-04-19 DIAGNOSIS — I872 Venous insufficiency (chronic) (peripheral): Secondary | ICD-10-CM | POA: Insufficient documentation

## 2012-04-19 DIAGNOSIS — L97509 Non-pressure chronic ulcer of other part of unspecified foot with unspecified severity: Secondary | ICD-10-CM | POA: Insufficient documentation

## 2012-05-03 ENCOUNTER — Emergency Department (HOSPITAL_COMMUNITY)
Admission: EM | Admit: 2012-05-03 | Discharge: 2012-05-03 | Disposition: A | Discharge status: Skilled Nursing Facility | Payer: Medicare Other | Attending: Emergency Medicine | Admitting: Emergency Medicine

## 2012-05-03 ENCOUNTER — Emergency Department (HOSPITAL_COMMUNITY): Payer: Medicare Other

## 2012-05-03 DIAGNOSIS — B359 Dermatophytosis, unspecified: Secondary | ICD-10-CM

## 2012-05-03 DIAGNOSIS — Z8673 Personal history of transient ischemic attack (TIA), and cerebral infarction without residual deficits: Secondary | ICD-10-CM | POA: Insufficient documentation

## 2012-05-03 DIAGNOSIS — W010XXA Fall on same level from slipping, tripping and stumbling without subsequent striking against object, initial encounter: Secondary | ICD-10-CM | POA: Insufficient documentation

## 2012-05-03 DIAGNOSIS — I10 Essential (primary) hypertension: Secondary | ICD-10-CM | POA: Insufficient documentation

## 2012-05-03 DIAGNOSIS — E1169 Type 2 diabetes mellitus with other specified complication: Secondary | ICD-10-CM | POA: Insufficient documentation

## 2012-05-03 DIAGNOSIS — Z79899 Other long term (current) drug therapy: Secondary | ICD-10-CM | POA: Insufficient documentation

## 2012-05-03 DIAGNOSIS — Z794 Long term (current) use of insulin: Secondary | ICD-10-CM | POA: Insufficient documentation

## 2012-05-03 DIAGNOSIS — R51 Headache: Secondary | ICD-10-CM | POA: Insufficient documentation

## 2012-05-03 DIAGNOSIS — S0180XA Unspecified open wound of other part of head, initial encounter: Secondary | ICD-10-CM | POA: Insufficient documentation

## 2012-05-03 DIAGNOSIS — W19XXXA Unspecified fall, initial encounter: Secondary | ICD-10-CM

## 2012-05-03 DIAGNOSIS — Z7901 Long term (current) use of anticoagulants: Secondary | ICD-10-CM | POA: Insufficient documentation

## 2012-05-03 DIAGNOSIS — T45515A Adverse effect of anticoagulants, initial encounter: Secondary | ICD-10-CM

## 2012-05-03 DIAGNOSIS — L97509 Non-pressure chronic ulcer of other part of unspecified foot with unspecified severity: Secondary | ICD-10-CM

## 2012-05-03 DIAGNOSIS — Z853 Personal history of malignant neoplasm of breast: Secondary | ICD-10-CM | POA: Insufficient documentation

## 2012-05-03 DIAGNOSIS — T148XXA Other injury of unspecified body region, initial encounter: Secondary | ICD-10-CM

## 2012-05-03 DIAGNOSIS — Z86718 Personal history of other venous thrombosis and embolism: Secondary | ICD-10-CM | POA: Insufficient documentation

## 2012-05-03 DIAGNOSIS — S0990XA Unspecified injury of head, initial encounter: Secondary | ICD-10-CM

## 2012-05-03 LAB — CBC
Hemoglobin: 12.8 g/dL (ref 12.0–15.0)
MCV: 98 fL (ref 78.0–100.0)
Platelets: 227 10*3/uL (ref 150–400)
RBC: 3.94 MIL/uL (ref 3.87–5.11)
WBC: 8.1 10*3/uL (ref 4.0–10.5)

## 2012-05-03 LAB — DIFFERENTIAL
Lymphocytes Relative: 17 % (ref 12–46)
Lymphs Abs: 1.4 10*3/uL (ref 0.7–4.0)
Monocytes Relative: 10 % (ref 3–12)
Neutrophils Relative %: 71 % (ref 43–77)

## 2012-05-03 LAB — PROTIME-INR: INR: 1.79 — ABNORMAL HIGH (ref 0.00–1.49)

## 2012-05-03 LAB — BASIC METABOLIC PANEL
CO2: 33 mEq/L — ABNORMAL HIGH (ref 19–32)
Glucose, Bld: 129 mg/dL — ABNORMAL HIGH (ref 70–99)
Potassium: 3.8 mEq/L (ref 3.5–5.1)
Sodium: 141 mEq/L (ref 135–145)

## 2012-05-03 LAB — GLUCOSE, CAPILLARY: Glucose-Capillary: 116 mg/dL — ABNORMAL HIGH (ref 70–99)

## 2012-05-03 MED ORDER — CLOTRIMAZOLE 1 % EX CREA: TOPICAL_CREAM | CUTANEOUS | Status: DC

## 2012-05-03 NOTE — ED Notes (Signed)
Pt in from [facility] via [facility] for fall, pt quit using walker & fell on to tile floor, pt denies LOC, pt has Laceration to L forehead, bleeding controlled upon arrival to ED, pt A&O x4, follows commands, speaks in complete sentences, no memory loss, pt rcvd normal dose of Insulin before meal & did not eat, CBG 177 prior to arrival

## 2012-05-03 NOTE — ED Provider Notes (Addendum)
Patient suffered groud level fall striking her head. Patient is alert Glasgow Coma Score 15 moves all extremities  Doug Sou, MD 05/04/12 [id number]  Doug Sou, MD 05/04/12 1191

## 2012-05-03 NOTE — ED Notes (Signed)
Jacubowitz, MD removed pt from [facility]

## 2012-05-03 NOTE — ED Provider Notes (Signed)
History     CSN: [id number]  Arrival date & time 05/03/12  4098   First MD Initiated Contact with Patient 05/03/12 1944      Chief Complaint  Patient presents with  . Fall    Patient is a 78 y.o. female presenting with fall. The history is provided by the patient.  Fall The accident occurred 1 to 2 hours ago. The fall occurred while walking (pt was going to dinner and tripped and fell over her walker). Distance fallen: ground-level. She landed on a hard floor. The point of impact was the head. The pain is present in the head. The pain is mild. She was not ambulatory at the scene. There was no entrapment after the fall. There was no drug use involved in the accident. There was no alcohol use involved in the accident. Pertinent negatives include no fever, no abdominal pain and no vomiting. Treatment on scene includes a c-collar. She has tried nothing for the symptoms.    Past Medical History  Diagnosis Date  . Diabetes mellitus   . Hypertension   . Heart murmur   . Benign familial tremor   . Rosacea   . Diverticulitis   . Heart failure   . TIA (transient ischemic attack) 2010  . Arthritis   . Cancer     breast -rt    Past Surgical History  Procedure Date  . Mastectomy partial / lumpectomy 11/02/2000    Right, with SLN  . Mastectomy partial / lumpectomy 05/15/2008    Left  . Breast lumpectomy 01/19/2010    right  . Mastectomy 03/31/2010    Right    No family history on file.  History  Substance Use Topics  . Smoking status: Never Smoker   . Smokeless tobacco: Not on file  . Alcohol Use: No    OB History    Grav Para Term Preterm Abortions TAB SAB Ect Mult Living                  Review of Systems  Constitutional: Negative for fever, chills, activity change and appetite change.  HENT: Negative for neck pain and neck stiffness.   Respiratory: Negative for cough, chest tightness, shortness of breath and wheezing.   Cardiovascular: Negative for chest pain and  palpitations.  Gastrointestinal: Negative for vomiting, abdominal pain, diarrhea and constipation.  Genitourinary: Negative for dysuria, decreased urine volume and difficulty urinating.  Skin: Positive for rash (bilateral lower legs) and wound (to bilateral feet (unchanged diabetic foot ulcers) and left forehead).  Neurological: Positive for weakness (at baseline). Negative for seizures, syncope, facial asymmetry and light-headedness.  Psychiatric/Behavioral: Negative for confusion and agitation.  All other systems reviewed and are negative.    Allergies  Bee venom; Celebrex; Lidocaine; Lidocaine hcl; Penicillins; and Procaine hcl  Home Medications   Current Outpatient Rx  Name Route Sig Dispense Refill  . ALUM & MAG HYDROXIDE-SIMETH 200-200-20 MG/5ML PO SUSP Oral Take 5 mLs by mouth every 6 (six) hours as needed. For upset stomach    . ASPIRIN 81 MG PO TABS Oral Take 81 mg by mouth daily.      [name] CALCIUM CARBONATE ANTACID 500 MG PO CHEW Oral Chew 1 tablet by mouth daily.      [name] DIABETIC TUSSIN DM MAX ST PO Oral Take by mouth as needed.      [name] DOCUSATE SODIUM 100 MG PO CAPS Oral Take 100 mg by mouth daily.      . FENTANYL 25  MCG/HR TD PT72 Transdermal Place 1 patch onto the skin every 3 (three) days.      . FUROSEMIDE 40 MG PO TABS Oral Take 40 mg by mouth 2 (two) times daily.      [name] GLUCOSAMINE-CHONDROITIN 500-400 MG PO TABS Oral Take 1 tablet by mouth 3 (three) times daily.      [name] HYDROCODONE-ACETAMINOPHEN 5-325 MG PO TABS Oral Take 1 tablet by mouth every 6 (six) hours as needed.      [name] HYDROXYZINE HCL 10 MG PO TABS Oral Take 10 mg by mouth 3 (three) times daily as needed.      [name] NOVOLOG MIX 70/30 FLEXPEN Arnold Subcutaneous Inject into the skin.      . ISOSORBIDE MONONITRATE ER 60 MG PO TB24 Oral Take 60 mg by mouth daily.      [name] LEVOTHYROXINE SODIUM 75 MCG PO TABS Oral Take 75 mcg by mouth daily.      [name] LORAZEPAM 0.5 MG PO TABS Oral Take 0.5 mg by mouth every 6 (six) hours as needed.       . LUBIPROSTONE 24 MCG PO CAPS Oral Take 24 mcg by mouth 2 (two) times daily with a meal.      . CERTA-VITE-LUTEIN PO Oral Take 0.4 mg by mouth daily.      [name] NITROGLYCERIN 0.4 MG SL SUBL Sublingual Place 0.4 mg under the tongue every 5 (five) minutes as needed.      . OMEGA-3-ACID ETHYL ESTERS 1 G PO CAPS Oral Take 2 g by mouth 2 (two) times daily.      [name] OMEPRAZOLE 20 MG PO CPDR Oral Take 20 mg by mouth daily.      [name] POTASSIUM CHLORIDE CRYS ER 20 MEQ PO TBCR Oral Take 20 mEq by mouth 2 (two) times daily.      [name] ALIGN PO Oral Take 4 mg by mouth daily.     [name] PROPRANOLOL HCL 10 MG PO TABS Oral Take 10 mg by mouth 3 (three) times daily.      [name] SIMVASTATIN 40 MG PO TABS Oral Take 40 mg by mouth at bedtime.      [name] SALINE NASAL SPRAY 0.65 % NA SOLN Nasal Place 1 spray into the nose 4 (four) times daily.      [name] SPIRONOLACTONE 50 MG PO TABS Oral Take 50 mg by mouth daily.      . TRAZODONE HCL 100 MG PO TABS Oral Take 100 mg by mouth at bedtime.      [name] VALSARTAN 160 MG PO TABS Oral Take 160 mg by mouth daily.      [name] VITAMIN D (ERGOCALCIFEROL) 50000 UNITS PO CAPS Oral Take 50,000 Units by mouth every 30 (thirty) days.    . WARFARIN SODIUM 5 MG PO TABS Oral Take 5 mg by mouth every other day. Alternating 6 mg    . WARFARIN SODIUM 6 MG PO TABS Oral Take 6 mg by mouth every other day. Alternating every other day with 5 mg      BP 159/68  Pulse 94  Temp 97.8 F (36.6 C) (Oral)  Resp 11  SpO2 97%  Physical Exam  Nursing note and vitals reviewed. Constitutional: She is oriented to person, place, and time. She appears well-developed and well-nourished.  HENT:  Head: Normocephalic.  Right Ear: External ear normal.  Left Ear: External ear normal.  Nose: Nose normal.  Mouth/Throat: Oropharynx is clear and moist. No oropharyngeal exudate.  Eyes: Conjunctivae are normal. Pupils are  equal, round, and reactive to light.  Neck: Normal range of motion. Neck supple.  Cardiovascular: Normal rate, regular  rhythm, normal heart sounds and intact distal pulses.  Exam reveals no gallop and no friction rub.   No murmur heard. Pulmonary/Chest: Effort normal and breath sounds normal. No respiratory distress. She has no wheezes. She has no rales. She exhibits no tenderness.  Abdominal: Soft. Bowel sounds are normal. She exhibits no distension and no mass. There is no tenderness. There is no rebound and no guarding.  Musculoskeletal: Normal range of motion. She exhibits no edema and no tenderness.  Neurological: She is alert and oriented to person, place, and time. She displays normal reflexes. No cranial nerve deficit. She exhibits normal muscle tone. Coordination normal.       5/5 motor strength in bilateral upper and lower extremities  Skin: Skin is warm and dry. Rash (erythema to bilateral lower extremities distal to knees; erythematous raised lesions consistent with tinea corporis) noted. There is erythema (to bilateral lower extremities with brawny changes from venous stasis).       Ecchymosis surrounding 0.5cm superficial laceration superior and lateral to left eye  0.5cm diabetic foot ulcer to plantar aspect of right foot; 1cm diabetic foot ulcer to plantar aspect of left foot  Psychiatric: She has a normal mood and affect. Her behavior is normal. Judgment and thought content normal.    ED Course  LACERATION REPAIR Performed by: Clemetine Marker Authorized by: Doug Sou Consent: Verbal consent obtained. Consent given by: patient Body area: head/neck Location details: scalp Laceration length: 2 cm Tendon involvement: none Nerve involvement: none Vascular damage: no Patient sedated: no Irrigation solution: saline Irrigation method: jet lavage Amount of cleaning: standard Debridement: none Degree of undermining: none Skin closure: glue Technique: simple Approximation: close Approximation difficulty: simple Patient tolerance: Patient tolerated the procedure well with no immediate  complications.   (including critical care time)  Labs Reviewed  BASIC METABOLIC PANEL - Abnormal; Notable for the following:    CO2 33 (*)     Glucose, Bld 129 (*)     BUN 41 (*)     GFR calc non Af Amer 51 (*)     GFR calc Af Amer 59 (*)     All other components within normal limits  PROTIME-INR - Abnormal; Notable for the following:    Prothrombin Time 21.1 (*)     INR 1.79 (*)     All other components within normal limits  GLUCOSE, CAPILLARY - Abnormal; Notable for the following:    Glucose-Capillary 116 (*)     All other components within normal limits  CBC  DIFFERENTIAL   Ct Head Wo Contrast  05/03/2012  *RADIOLOGY REPORT*  Clinical Data: Status post fall; laceration to the left forehead, with concern for head injury.  CT HEAD WITHOUT CONTRAST  Technique:  Contiguous axial images were obtained from the base of the skull through the vertex without contrast.  Comparison: CT of the head, and MRI/MRA of the brain performed 05/20/2009  Findings: There is no evidence of acute infarction, mass lesion, or intra- or extra-axial hemorrhage on CT.  Prominence of the ventricles and sulci reflects mild to moderate cortical volume loss.  Mild cerebellar atrophy is noted.  Mild scattered periventricular and subcortical white matter change likely reflects small vessel ischemic microangiopathy.  Prominent Virchow-Robin spaces are seen at the basal ganglia.  The brainstem and fourth ventricle are within normal limits.  The basal ganglia are unremarkable in appearance.  The cerebral hemispheres demonstrate grossly normal gray-white differentiation. No mass effect or midline shift is seen.  There is no evidence of fracture; visualized osseous structures are unremarkable in appearance.  The visualized portions of the orbits are within normal limits.  The paranasal sinuses and mastoid air cells are well-aerated.  Diffuse soft tissue swelling is noted overlying the left orbit and along the left frontal  calvarium.  IMPRESSION:  1.  No evidence of traumatic intracranial injury or fracture. 2.  Diffuse soft tissue swelling overlying the left orbit and along the left frontal calvarium. 3.  Mild to moderate cortical volume loss and scattered small vessel ischemic microangiopathy.  Original Report Authenticated By: Tonia Ghent, M.D.     1. Diabetic foot ulcer   2. Fall   3. Warfarin-induced coagulopathy   4. Abrasion   5. Closed head injury   6. Ringworm      Date: 05/03/2012  Rate: 67 bpm  Rhythm: normal sinus rhythm  QRS Axis: left  Intervals: normal  ST/T Wave abnormalities: nonspecific ST changes  Conduction Disutrbances:none  Narrative Interpretation:   Old EKG Reviewed: changes noted, 1st degree AV block resolved    MDM  78 yo F presents after a witnessed mechanical fall at her nursing home. Pt usually ambulates with the use of a walker but decided to try to walk without the walker and tripped and fell on her left head. Pt is on Coumadin for a recent DVT. No focal neuro deficits and no neuro symptoms. Coags ordered and reveal patient to have subtherapeutic INR.   Imaging of head and cervical spine negative for evidence of intracranial bleed, skull fracture, or cervical spine fracture. Lac to left forehead closed with dermabond (see procedure note). Will treat tinea corporis with topical Clotrimazole. Patient given return precautions, including worsening of signs or symptoms. Patient instructed to follow-up with primary care physician.           Clemetine Marker, MD 05/04/12 0040

## 2012-05-03 NOTE — Discharge Instructions (Signed)
Abrasions Abrasions are skin scrapes. Their treatment depends on how large and deep the abrasion is. Abrasions do not extend through all layers of the skin. A cut or lesion through all skin layers is called a laceration. HOME CARE INSTRUCTIONS   If you were given a dressing, change it at least once a day or as instructed by your caregiver. If the bandage sticks, soak it off with a solution of water or hydrogen peroxide.   Twice a day, wash the area with soap and water to remove all the cream/ointment. You may do this in a sink, under a tub faucet, or in a shower. Rinse off the soap and pat dry with a clean towel. Look for signs of infection (see below).   Reapply cream/ointment according to your caregiver's instruction. This will help prevent infection and keep the bandage from sticking. Telfa or gauze over the wound and under the dressing or wrap will also help keep the bandage from sticking.   If the bandage becomes wet, dirty, or develops a foul smell, change it as soon as possible.   Only take over-the-counter or prescription medicines for pain, discomfort, or fever as directed by your caregiver.  SEEK IMMEDIATE MEDICAL CARE IF:   Increasing pain in the wound.   Signs of infection develop: redness, swelling, surrounding area is tender to touch, or pus coming from the wound.   You have a fever.   Any foul smell coming from the wound or dressing.  Most skin wounds heal within ten days. Facial wounds heal faster. However, an infection may occur despite proper treatment. You should have the wound checked for signs of infection within 24 to 48 hours or sooner if problems arise. If you were not given a wound-check appointment, look closely at the wound yourself on the second day for early signs of infection listed above. MAKE SURE YOU:   Understand these instructions.   Will watch your condition.   Will get help right away if you are not doing well or get worse.  Document Released:  08/10/2005 Document Revised: 10/20/2011 Document Reviewed: 10/04/2011 [facility] Patient Information 2012 [place], [place].Coagulopathy Coagulopathy (bleeding disorder) is an abnormal condition that makes it difficult for blood to clot. Platelets (specialized blood cells) and clotting factors (special blood proteins) combine to help the blood clot when a cut has been made in the skin. If platelets and clotting factors are not present in sufficient numbers, clots cannot form properly to stop bleeding.  CAUSES  Bleeding disorders usually have two causes:  Acquired bleeding disorders are due to a disease process or are drug induced, such as:   Severe liver disease.   Over treatment of Coumadin (Warfarin).   Drug induced Immune Thrombocytopenia. Heparin is a common cause.   Bone marrow disorders.   Lupus.   Prolonged use of antibiotics may cause vitamin K deficiency.   Congenital (Inherited) bleeding disorders.   Hemophilia.   Deficiencies of clotting Factors II, V, VII, IX, X.   Von Willebrands Disease. People with this disease are especially prone to bleeding if aspirin or NSAIDS (nonsteroidal anti-inflammatory drugs) products are taken.  SYMPTOMS  Whether the coagulopathy is acquired or congenital, symptoms are usually the same:   Unusual bleeding or bleeding very easily such as:   Nosebleeds.   Bloody stools or blood in the urine.   Abnormal or very heavy menstrual bleeding.   Cuts that bleed excessively.   Excessive bruising:   Bruising very easily.   Unexplained bruising.  Dizziness.  DIAGNOSIS  Coagulopathy is usually diagnosed by blood tests and a physical exam. This includes:  Clotting factor blood tests such as:   Partial Thromboplastin Time (PTT) and Prothrombin Time (PT). These tests show how long it takes your blood to clot. PTT and PT results may be high with a bleeding disorder.   Platelet counts. Platelets are a type of blood cell that help stop  bleeding. The platelet count may be low with a bleeding disorder.   Fibrinogen levels. Fibrinogen is a protein produced by the liver. It helps blood to clot. The Fibrinogen level may be low with bleeding disorder.   A physical exam may reveal bruising throughout the body.   Small petechiae (small pinpoint red or purple "dots"). Petechiae has a rash like appearance but does not itch. It may appear anywhere on the body, including the hands, feet, inside the mouth or even in the whites of the eyes.  TREATMENT  Treatment depends on whether the coagulopathy is acquired or congenital.   If clotting factors are low (deficient), these may need to be replaced.   Platelet transfusions may need to be given.   If your caregiver has determined that a medication is the cause of the bleeding disorder, your caregiver may need to adjust or discontinue the medication.   Treatment of the underlying disease state to correct the coagulopathy.   If your Vitamin K level is low, it will need to be replaced.  COMPLICATIONS OF COAGULOPATHY A bleeding disorder can develop very serious complications such as:  Bleeding into the brain (Intracerebral bleeding).   Liver failure.   Kidney failure.   Gastrointestinal bleeding.  SEEK IMMEDIATE MEDICAL CARE IF:  You have large areas of bruising.   You have a severe headache that does not go away.   You have blood in your urine or stool.   You vomit blood.   You develop redness or rashes on your skin.   You become dizzy or pass out (loss of consciousness).   You develop chest pain or shortness of breath. If the chest pain or shortness of breath is severe, call your local emergency service immediately.  MAKE SURE YOU:   Understand these instructions.   Will watch your condition.   Will get help right away if you are not doing well or get worse.  Document Released: 08/20/2004 Document Revised: 10/20/2011 Document Reviewed: 12/11/2008 [facility] Patient  Information 2012 [place], [place].Blunt Trauma You have been evaluated for injuries. You have been examined and your caregiver has not found injuries serious enough to require hospitalization. It is common to have multiple bruises and sore muscles following an accident. These tend to feel worse for the first 24 hours. You will feel more stiffness and soreness over the next several hours and worse when you wake up the first morning after your accident. After this point, you should begin to improve with each passing day. The amount of improvement depends on the amount of damage done in the accident. Following your accident, if some part of your body does not work as it should, or if the pain in any area continues to increase, you should return to the Emergency Department for re-evaluation.  HOME CARE INSTRUCTIONS  Routine care for sore areas should include:  Ice to sore areas every 2 hours for 20 minutes while awake for the next 2 days.   Drink extra fluids (not alcohol).   Take a hot or warm shower or bath once or twice a  day to increase blood flow to sore muscles. This will help you "limber up".   Activity as tolerated. Lifting may aggravate neck or back pain.   Only take over-the-counter or prescription medicines for pain, discomfort, or fever as directed by your caregiver. Do not use aspirin. This may increase bruising or increase bleeding if there are small areas where this is happening.  SEEK IMMEDIATE MEDICAL CARE IF:  Numbness, tingling, weakness, or problem with the use of your arms or legs.   A severe headache is not relieved with medications.   There is a change in bowel or bladder control.   Increasing pain in any areas of the body.   Short of breath or dizzy.   Nauseated, vomiting, or sweating.   Increasing belly (abdominal) discomfort.   Blood in urine, stool, or vomiting blood.   Pain in either shoulder in an area where a shoulder strap would be.   Feelings of  lightheadedness or if you have a fainting episode.  Sometimes it is not possible to identify all injuries immediately after the trauma. It is important that you continue to monitor your condition after the emergency department visit. If you feel you are not improving, or improving more slowly than should be expected, call your physician. If you feel your symptoms (problems) are worsening, return to the Emergency Department immediately. Document Released: 07/27/2001 Document Revised: 10/20/2011 Document Reviewed: 06/18/2008 [facility] Patient Information 2012 [place], [place].Diabetes and Foot Care Diabetes may cause you to have a poor blood supply (circulation) to your legs and feet. Because of this, the skin may be thinner, break easier, and heal more slowly. You also may have nerve damage in your legs and feet causing decreased feeling. You may not notice minor injuries to your feet that could lead to serious problems or infections. Taking care of your feet is one of the most important things you can do for yourself.  HOME CARE INSTRUCTIONS  Do not go barefoot. Bare feet are easily injured.   Check your feet daily for blisters, cuts, and redness.   Wash your feet with warm water (not hot) and mild soap. Pat your feet and between your toes until completely dry.   Apply a moisturizing lotion that does not contain alcohol or petroleum jelly to the dry skin on your feet and to dry brittle toenails. Do not put it between your toes.   Trim your toenails straight across. Do not dig under them or around the cuticle.   Do not cut corns or calluses, or try to remove them with medicine.   Wear clean cotton socks or stockings every day. Make sure they are not too tight. Do not wear knee high stockings since they may decrease blood flow to your legs.   Wear leather shoes that fit properly and have enough cushioning. To break in new shoes, wear them just a few hours a day to avoid injuring your feet.   Wear  shoes at all times, even in the house.   Do not cross your legs. This may decrease the blood flow to your feet.   If you find a minor scrape, cut, or break in the skin on your feet, keep it and the skin around it clean and dry. These areas may be cleansed with mild soap and water. Do not use peroxide, alcohol, iodine or Merthiolate.   When you remove an adhesive bandage, be sure not to harm the skin around it.   If you have a wound, look  at it several times a day to make sure it is healing.   Do not use heating pads or hot water bottles. Burns can occur. If you have lost feeling in your feet or legs, you may not know it is happening until it is too late.   Report any cuts, sores or bruises to your caregiver. Do not wait!  SEEK MEDICAL CARE IF:   You have an injury that is not healing or you notice redness, numbness, burning, or tingling.   Your feet always feel cold.   You have pain or cramps in your legs and feet.  SEEK IMMEDIATE MEDICAL CARE IF:   There is increasing redness, swelling, or increasing pain in the wound.   There is a red line that goes up your leg.   Pus is coming from a wound.   You develop an unexplained oral temperature above 102 F (38.9 C), or as your caregiver suggests.   You notice a bad smell coming from an ulcer or wound.  MAKE SURE YOU:   Understand these instructions.   Will watch your condition.   Will get help right away if you are not doing well or get worse.  Document Released: 10/28/2000 Document Revised: 10/20/2011 Document Reviewed: 05/06/2009 [facility] Patient Information 2012 [place], [place].

## 2012-05-04 NOTE — ED Provider Notes (Signed)
I have personally seen and examined the patient.  I have discussed the plan of care with the resident.  I have reviewed the documentation on PMH/FH/Soc. History.  I have reviewed the documentation of the resident and agree.  Doug Sou, MD 05/04/12 [phone]

## 2012-06-07 ENCOUNTER — Encounter (HOSPITAL_BASED_OUTPATIENT_CLINIC_OR_DEPARTMENT_OTHER): Payer: Medicare Other | Attending: Internal Medicine

## 2012-06-07 DIAGNOSIS — L259 Unspecified contact dermatitis, unspecified cause: Secondary | ICD-10-CM | POA: Insufficient documentation

## 2012-06-07 DIAGNOSIS — B372 Candidiasis of skin and nail: Secondary | ICD-10-CM | POA: Insufficient documentation

## 2012-06-07 DIAGNOSIS — I872 Venous insufficiency (chronic) (peripheral): Secondary | ICD-10-CM | POA: Insufficient documentation

## 2012-06-07 DIAGNOSIS — E1169 Type 2 diabetes mellitus with other specified complication: Secondary | ICD-10-CM | POA: Insufficient documentation

## 2012-07-05 ENCOUNTER — Encounter (HOSPITAL_BASED_OUTPATIENT_CLINIC_OR_DEPARTMENT_OTHER): Payer: Medicare Other

## 2012-07-09 LAB — PROTIME-INR

## 2012-08-07 ENCOUNTER — Ambulatory Visit (INDEPENDENT_AMBULATORY_CARE_PROVIDER_SITE_OTHER): Payer: Medicare Other | Admitting: Nurse Practitioner

## 2012-08-07 ENCOUNTER — Encounter: Payer: Self-pay | Admitting: Nurse Practitioner

## 2012-08-07 VITALS — BP 146/62 | HR 104 | Ht 67.0 in | Wt 176.0 lb

## 2012-08-07 DIAGNOSIS — R079 Chest pain, unspecified: Secondary | ICD-10-CM

## 2012-08-07 NOTE — Patient Instructions (Signed)
Stay on current medicines  We are going to update your echo  See Dr. Patty Sermons in 2 months.  Call the [facility] office at [phone] if you have any questions, problems or concerns.

## 2012-08-07 NOTE — Progress Notes (Signed)
[name] Date of Birth: 18-Aug-1926 Medical Record #[id number]  History of Present Illness: [name] is seen back today for a post hospital visit. She is seen for Dr. Patty Sermons. She is a former patient of Dr. Silva Bandy. She goes by the first name "[name]". She has a history of chronic pain, GERD, hypothyroidism, HTN, HLD, CKD, anxiety, OA, breast cancer and single vessel CAD per cath back in 2006. She had a nuclear stress test back in April of 2011 showing normal EF. Her perfusion imaging was abnormal but correlates to her known occluded RCA. She has been managed medically. Last echo was about 3 years ago with results as noted below.  She comes in today. She is here alone. She says she is not sure why she is here and that she has been doing ok. It has been quite some time since she was last seen by cardiology. She now lives at [facility]. She thinks she is doing ok. She did have a fall back in the summer. She was slow to recover. No apparent injury. Did not have syncope. Now uses a walker. Coumadin is now off of her medicine list which I suspect was for her past TIA however her ER record for the fall says it was for a recent DVT. Her old records that I had showed she had been on Plavix and perhaps Aggrenox. She is now just on aspirin. She does try to walk some every day. She has had 2 spells of some chest discomfort that occurred at rest. This happened a couple of months ago. She is not sure. They lasted less than 15 minutes. She did not call the nurse. She did not seem to be all that concerned about it. She is not to have NTG at her bedside. She is not too short of breath. Review of her records from [facility] show that she has had issues with pressure ulcers and general aging concerns.   Current Outpatient Prescriptions on File Prior to Visit  Medication Sig Dispense Refill  . alum & mag hydroxide-simeth (GERI-LANTA) 200-200-20 MG/5ML suspension Take 30 mLs by mouth every 6 (six) hours as needed.  For upset stomach      . calcium carbonate (TUMS - DOSED IN MG ELEMENTAL CALCIUM) 500 MG chewable tablet Chew 1 tablet by mouth daily.        . clotrimazole (LOTRIMIN) 1 % cream Apply to affected area 2 times daily  15 g  0  . Dextromethorphan-Guaifenesin (DIABETIC TUSSIN DM MAX ST PO) Take by mouth as needed.        . docusate sodium (COLACE) 100 MG capsule Take 100 mg by mouth daily.        . DULoxetine (CYMBALTA) 30 MG capsule Take 30 mg by mouth daily.      . fentaNYL (DURAGESIC - DOSED MCG/HR) 25 MCG/HR Place 1 patch onto the skin every 3 (three) days.        . furosemide (LASIX) 40 MG tablet Take 40 mg by mouth 2 (two) times daily.        [name] glucosamine-chondroitin 500-400 MG tablet Take 1 tablet by mouth 3 (three) times daily.        [name] HYDROcodone-acetaminophen (NORCO) 5-325 MG per tablet Take 1 tablet by mouth every 6 (six) hours as needed.        . hydrOXYzine (ATARAX) 10 MG tablet Take 10 mg by mouth 3 (three) times daily as needed.        . Insulin  Aspart Prot & Aspart (NOVOLOG MIX 70/30 FLEXPEN [place]) Inject into the skin.        [name] isosorbide mononitrate (IMDUR) 60 MG 24 hr tablet Take 60 mg by mouth daily.        [name] levothyroxine (SYNTHROID, LEVOTHROID) 75 MCG tablet Take 75 mcg by mouth daily.        [name] LORazepam (ATIVAN) 0.5 MG tablet Take 0.5 mg by mouth every 6 (six) hours as needed.        . lubiprostone (AMITIZA) 24 MCG capsule Take 24 mcg by mouth 2 (two) times daily with a meal.        . Multiple Vitamins-Minerals (CERTA-VITE-LUTEIN PO) Take 0.4 mg by mouth daily.        . nitroGLYCERIN (NITROSTAT) 0.4 MG SL tablet Place 0.4 mg under the tongue every 5 (five) minutes as needed.        . Probiotic Product (ALIGN PO) Take 4 mg by mouth daily.       . propranolol (INDERAL) 10 MG tablet Take 10 mg by mouth 2 (two) times daily.       . sodium chloride (OCEAN) 0.65 % nasal spray Place 1 spray into the nose 4 (four) times daily.        [name] spironolactone (ALDACTONE) 50 MG tablet Take 50 mg  by mouth daily.        . traZODone (DESYREL) 100 MG tablet Take 100 mg by mouth at bedtime.        . valsartan (DIOVAN) 160 MG tablet Take 160 mg by mouth daily.        . Vitamin D, Ergocalciferol, (DRISDOL) 50000 UNITS CAPS Take 50,000 Units by mouth every 30 (thirty) days.        Allergies  Allergen Reactions  . Bee Venom     STINGS  . Celebrex (Celecoxib)   . Lidocaine   . Lidocaine Hcl   . Penicillins   . Procaine Hcl     Past Medical History  Diagnosis Date  . Diabetes mellitus   . Hypertension   . Heart murmur   . Benign familial tremor   . Rosacea   . Diverticulitis   . Heart failure     diastolic heart failure  . TIA (transient ischemic attack) 2010  . Arthritis   . Breast cancer     breast -rt  . CAD (coronary artery disease)     single vessel CAD per cath in 2006; scattered nonobstructive disease in the left system, left to right collaterals to the RCA which was occluded by flush shots; she has been managed medically    Past Surgical History  Procedure Date  . Mastectomy partial / lumpectomy 11/02/2000    Right, with SLN  . Mastectomy partial / lumpectomy 05/15/2008    Left  . Breast lumpectomy 01/19/2010    right  . Mastectomy 03/31/2010    Right    History  Smoking status  . Never Smoker   Smokeless tobacco  . Not on file    History  Alcohol Use No    History reviewed. No pertinent family history.  Review of Systems: The review of systems is per the HPI.  All other systems were reviewed and are negative.  Physical Exam: BP 146/62  Pulse 104  Ht 5\' 7"  (1.702 m)  Wt 176 lb (79.833 kg)  BMI 27.57 kg/m2  SpO2 86% Patient is very pleasant and in no acute distress. She is obese. She is using a walker.  Skin is warm and dry. She is bruised. Color is normal.  HEENT is unremarkable. Normocephalic/atraumatic. PERRL. Sclera are nonicteric. Neck is supple. No masses. No JVD. Lungs are clear. Cardiac exam shows a regular rate and rhythm. Abdomen is soft.  Extremities are quite full with trace edema. She has support stockings in place. Gait is not tested. ROM appears intact. No gross neurologic deficits noted.   LABORATORY DATA: EKG today shows sinus rhythm, 1st degree AV block and LAFB. Tracing looks unchanged.   Echo Study Conclusions from July 2010  1. Left ventricle: The cavity size was normal. Wall thickness was increased in a pattern of mild LVH. Systolic function was normal. The estimated ejection fraction was in the range of 60% to 65%. Wall motion was normal; there were no regional wall motion abnormalities. Doppler parameters are consistent with abnormal left ventricular relaxation (grade 1 diastolic dysfunction). 2. Mitral valve: Mild regurgitation. Impressions:  - No cardiac source of emboli was indentified.  Assessment / Plan:  1. Chest pain - she has known CAD per remote cath. Managed medically. No current chest pain at this time. Would continue with her long acting nitrates. Will check echo. Will plan on seeing her back in about 2 months.   2. Diastolic heart failure - will update her echo  3. HTN  4. Recent fall  She seems to be pretty content with her current health status. Will update her echo. See Dr. Patty Sermons back in 2 months. Patient is agreeable to this plan and will call if any problems develop in the interim.

## 2012-08-14 ENCOUNTER — Ambulatory Visit (HOSPITAL_COMMUNITY): Payer: Medicare Other | Attending: Cardiology | Admitting: Radiology

## 2012-08-14 ENCOUNTER — Encounter: Payer: Self-pay | Admitting: Nurse Practitioner

## 2012-08-14 DIAGNOSIS — I10 Essential (primary) hypertension: Secondary | ICD-10-CM | POA: Insufficient documentation

## 2012-08-14 DIAGNOSIS — I2510 Atherosclerotic heart disease of native coronary artery without angina pectoris: Secondary | ICD-10-CM | POA: Insufficient documentation

## 2012-08-14 DIAGNOSIS — R072 Precordial pain: Secondary | ICD-10-CM | POA: Insufficient documentation

## 2012-08-14 DIAGNOSIS — I379 Nonrheumatic pulmonary valve disorder, unspecified: Secondary | ICD-10-CM | POA: Insufficient documentation

## 2012-08-14 DIAGNOSIS — I059 Rheumatic mitral valve disease, unspecified: Secondary | ICD-10-CM | POA: Insufficient documentation

## 2012-08-14 DIAGNOSIS — R079 Chest pain, unspecified: Secondary | ICD-10-CM

## 2012-08-14 DIAGNOSIS — I369 Nonrheumatic tricuspid valve disorder, unspecified: Secondary | ICD-10-CM | POA: Insufficient documentation

## 2012-08-14 DIAGNOSIS — I509 Heart failure, unspecified: Secondary | ICD-10-CM | POA: Insufficient documentation

## 2012-08-14 NOTE — Progress Notes (Signed)
Echocardiogram performed.  

## 2012-08-16 ENCOUNTER — Other Ambulatory Visit: Payer: Self-pay

## 2012-08-30 ENCOUNTER — Encounter (HOSPITAL_BASED_OUTPATIENT_CLINIC_OR_DEPARTMENT_OTHER): Payer: Medicare Other | Attending: Internal Medicine

## 2012-08-30 DIAGNOSIS — E1169 Type 2 diabetes mellitus with other specified complication: Secondary | ICD-10-CM | POA: Insufficient documentation

## 2012-08-30 DIAGNOSIS — L97509 Non-pressure chronic ulcer of other part of unspecified foot with unspecified severity: Secondary | ICD-10-CM | POA: Insufficient documentation

## 2012-10-02 ENCOUNTER — Encounter: Payer: Self-pay | Admitting: Cardiology

## 2012-10-02 ENCOUNTER — Ambulatory Visit (INDEPENDENT_AMBULATORY_CARE_PROVIDER_SITE_OTHER): Payer: Medicare Other | Admitting: Cardiology

## 2012-10-02 VITALS — BP 120/58 | HR 68 | Ht 67.0 in | Wt 182.0 lb

## 2012-10-02 DIAGNOSIS — M7989 Other specified soft tissue disorders: Secondary | ICD-10-CM

## 2012-10-02 DIAGNOSIS — I259 Chronic ischemic heart disease, unspecified: Secondary | ICD-10-CM

## 2012-10-02 DIAGNOSIS — R1013 Epigastric pain: Secondary | ICD-10-CM

## 2012-10-02 NOTE — Assessment & Plan Note (Signed)
The patient continues to have significant bilateral chronic lower leg edema secondary to venous insufficiency.  Her legs are wrapped today.  She states that she is being treated for cutaneous ulcerations on both lower extremities

## 2012-10-02 NOTE — Assessment & Plan Note (Signed)
The patient has frequent dyspepsia.  She has antacids on hand for when necessary use.

## 2012-10-02 NOTE — Progress Notes (Signed)
[name] Date of Birth:  [birth year] [facility] [address] [name]  [address] [phone]         Fax   [phone]  History of Present Illness: [name] is seen back today for a post Bruce visit. . She is a former patient of Dr. Silva Bruce. She goes by the first name "[name]". She has a history of chronic pain, GERD, hypothyroidism, HTN, HLD, CKD, anxiety, OA, breast cancer and single vessel CAD per cath back in 2006. She had a nuclear stress test back in April of 2011 showing normal EF. Her perfusion imaging was abnormal but correlates to her known occluded RCA. She has been managed medically. Last echo was about 3 years ago with results as noted below. . Review of her records from [name] show that she has had issues with pressure ulcers and general aging concerns.  The patient now lives in the health care skilled nursing facility section at [facility].  Her husband lives in the assisted living section.  They have lunch and supper together. The patient previously had been on Coumadin but now is on just a baby aspirin for anticoagulation.  She still has frequent nosebleeds.  She has painful arthritis and is on fentanyl patch Her last echocardiogram was 08/14/12 and showed an ejection fraction of 60-65% with grade 2 diastolic dysfunction and no significant valve abnormalities.   Current Outpatient Prescriptions  Medication Sig Dispense Refill  . alum & mag hydroxide-simeth (GERI-LANTA) 200-200-20 MG/5ML suspension Take 30 mLs by mouth every 6 (six) hours as needed. For upset stomach      . aspirin 81 MG tablet Take 81 mg by mouth daily.      . calcium carbonate (TUMS - DOSED IN MG ELEMENTAL CALCIUM) 500 MG chewable tablet Chew 1 tablet by mouth daily.        [name] docusate sodium (COLACE) 100 MG capsule Take 100 mg by mouth daily.        . DULoxetine (CYMBALTA) 30 MG capsule Take 30 mg by mouth daily.      . fentaNYL (DURAGESIC - DOSED MCG/HR) 25  MCG/HR Place 1 patch onto the skin every 3 (three) days.        . furosemide (LASIX) 40 MG tablet Take 40 mg by mouth 2 (two) times daily.        [name] glucosamine-chondroitin 500-400 MG tablet Take 1 tablet by mouth 3 (three) times daily.        [name] HYDROcodone-acetaminophen (NORCO) 5-325 MG per tablet Take 1 tablet by mouth every 6 (six) hours as needed.        . hydrOXYzine (ATARAX) 10 MG tablet Take 10 mg by mouth 3 (three) times daily as needed.        . Insulin Aspart Prot & Aspart (NOVOLOG MIX 70/30 FLEXPEN [place]) Inject into the skin.        [name] isosorbide mononitrate (IMDUR) 60 MG 24 hr tablet Take 60 mg by mouth daily.        [name] levothyroxine (SYNTHROID, LEVOTHROID) 75 MCG tablet Take 75 mcg by mouth daily. Once weekly      . LORazepam (ATIVAN) 0.5 MG tablet Take 0.5 mg by mouth every 6 (six) hours as needed.        [name] losartan (COZAAR) 100 MG tablet Take 100 mg by mouth daily.      [name] lubiprostone (AMITIZA) 24 MCG capsule Take 24 mcg by mouth 2 (two) times daily with a  meal.        . Multiple Vitamins-Minerals (CERTA-VITE-LUTEIN PO) Take 0.4 mg by mouth daily.        . nitroGLYCERIN (NITROSTAT) 0.4 MG SL tablet Place 0.4 mg under the tongue every 5 (five) minutes as needed.        [name] omeprazole (PRILOSEC) 20 MG capsule Take 20 mg by mouth 2 (two) times daily.      . Probiotic Product (ALIGN PO) Take 4 mg by mouth daily.       . propranolol (INDERAL) 10 MG tablet Take 10 mg by mouth 2 (two) times daily.       . sodium chloride (OCEAN) 0.65 % nasal spray Place 1 spray into the nose 4 (four) times daily.        [name] spironolactone (ALDACTONE) 50 MG tablet Take 50 mg by mouth daily.        . Vitamin D, Ergocalciferol, (DRISDOL) 50000 UNITS CAPS Take 50,000 Units by mouth every 30 (thirty) days.      . clotrimazole (LOTRIMIN) 1 % cream Apply to affected area 2 times daily  15 g  0  . Dextromethorphan-Guaifenesin (DIABETIC TUSSIN DM MAX ST PO) Take by mouth as needed.        . traZODone (DESYREL) 100 MG  tablet Take 100 mg by mouth at bedtime.          Allergies  Allergen Reactions  . Bee Venom     STINGS  . Celebrex (Celecoxib)   . Lidocaine   . Lidocaine Hcl   . Penicillins   . Procaine Hcl     Patient Active Problem List  Diagnosis  . 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 Diabetic foot ulcer  . Ischemic heart disease  . Leg swelling    History  Smoking status  . Never Smoker   Smokeless tobacco  . Not on file    History  Alcohol Use No    No family history on file.  Review of Systems: Constitutional: no fever chills diaphoresis or fatigue or change in weight.  Head and neck: no hearing loss, no epistaxis, no photophobia or visual disturbance. Respiratory: No cough, shortness of breath or wheezing. Cardiovascular: No chest pain peripheral edema, palpitations. Gastrointestinal: No abdominal distention, no abdominal pain, no change in bowel habits hematochezia or melena. Genitourinary: No dysuria, no frequency, no urgency, no nocturia. Musculoskeletal:No arthralgias, no back pain, no gait disturbance or myalgias. Neurological: No dizziness, no headaches, no numbness, no seizures, no syncope, no weakness, no tremors. Hematologic: No lymphadenopathy, no easy bruising. Psychiatric: No confusion, no hallucinations, no sleep disturbance.    Physical Exam: Filed Vitals:   10/02/12 1056  BP: 120/58  Pulse: 68   the general appearance reveals a elderly white female in a wheelchair.  She is alert and cooperative.  She does have some problems with recent memory.The head and neck exam reveals pupils equal and reactive.  Extraocular movements are full.  There is no scleral icterus.  The mouth and pharynx are normal.  The neck is supple.  The carotids reveal no bruits.  The jugular venous pressure is normal.  The  thyroid is not enlarged.  There is no lymphadenopathy.  The chest is clear to percussion and auscultation.  There are no rales or rhonchi.  Expansion of the  chest is symmetrical.  The precordium is quiet.  The first heart sound is normal.  The second heart sound is physiologically split.  There is no murmur  gallop rub or click.  There is no abnormal lift or heave.  The abdomen is soft and nontender.  The bowel sounds are normal.  The liver and spleen are not enlarged.  There are no abdominal masses.  There are no abdominal bruits.  Extremities reveal that both lower legs are wrapped with elastic bandages.There is no cyanosis or clubbing.  Strength is normal and symmetrical in all extremities.  There is no lateralizing weakness.  There are no sensory deficits.  The skin is warm and dry.  There is no rash.     Assessment / Plan: The patient is to continue same medication.  Recheck in 6 months for office visit and EKG

## 2012-10-02 NOTE — Patient Instructions (Addendum)
Your physician recommends that you continue on your current medications as directed. Please refer to the Current Medication list given to you today.  Your physician wants you to follow-up in: 6 months. You will receive a reminder letter in the mail two months in advance. If you don't receive a letter, please call our office to schedule the follow-up appointment.  

## 2012-10-02 NOTE — Assessment & Plan Note (Signed)
The patient has not been experiencing any chest pain or angina.  She has not had to take any recent nitroglycerin

## 2012-10-04 ENCOUNTER — Encounter (HOSPITAL_BASED_OUTPATIENT_CLINIC_OR_DEPARTMENT_OTHER): Payer: Medicare Other | Attending: Internal Medicine

## 2012-11-09 ENCOUNTER — Emergency Department (HOSPITAL_COMMUNITY)
Admission: EM | Admit: 2012-11-09 | Discharge: 2012-11-09 | Disposition: A | Discharge status: Skilled Nursing Facility | Payer: Medicare Other | Attending: Emergency Medicine | Admitting: Emergency Medicine

## 2012-11-09 ENCOUNTER — Encounter (HOSPITAL_COMMUNITY): Payer: Self-pay | Admitting: *Deleted

## 2012-11-09 ENCOUNTER — Emergency Department (HOSPITAL_COMMUNITY): Payer: Medicare Other

## 2012-11-09 DIAGNOSIS — Z872 Personal history of diseases of the skin and subcutaneous tissue: Secondary | ICD-10-CM | POA: Insufficient documentation

## 2012-11-09 DIAGNOSIS — Z7982 Long term (current) use of aspirin: Secondary | ICD-10-CM | POA: Insufficient documentation

## 2012-11-09 DIAGNOSIS — Z9861 Coronary angioplasty status: Secondary | ICD-10-CM | POA: Insufficient documentation

## 2012-11-09 DIAGNOSIS — Z8669 Personal history of other diseases of the nervous system and sense organs: Secondary | ICD-10-CM | POA: Insufficient documentation

## 2012-11-09 DIAGNOSIS — W1809XA Striking against other object with subsequent fall, initial encounter: Secondary | ICD-10-CM | POA: Insufficient documentation

## 2012-11-09 DIAGNOSIS — I10 Essential (primary) hypertension: Secondary | ICD-10-CM | POA: Insufficient documentation

## 2012-11-09 DIAGNOSIS — Y9389 Activity, other specified: Secondary | ICD-10-CM | POA: Insufficient documentation

## 2012-11-09 DIAGNOSIS — Z8673 Personal history of transient ischemic attack (TIA), and cerebral infarction without residual deficits: Secondary | ICD-10-CM | POA: Insufficient documentation

## 2012-11-09 DIAGNOSIS — I5030 Unspecified diastolic (congestive) heart failure: Secondary | ICD-10-CM | POA: Insufficient documentation

## 2012-11-09 DIAGNOSIS — S0101XA Laceration without foreign body of scalp, initial encounter: Secondary | ICD-10-CM

## 2012-11-09 DIAGNOSIS — Z853 Personal history of malignant neoplasm of breast: Secondary | ICD-10-CM | POA: Insufficient documentation

## 2012-11-09 DIAGNOSIS — S0100XA Unspecified open wound of scalp, initial encounter: Secondary | ICD-10-CM | POA: Insufficient documentation

## 2012-11-09 DIAGNOSIS — R011 Cardiac murmur, unspecified: Secondary | ICD-10-CM | POA: Insufficient documentation

## 2012-11-09 DIAGNOSIS — I2510 Atherosclerotic heart disease of native coronary artery without angina pectoris: Secondary | ICD-10-CM | POA: Insufficient documentation

## 2012-11-09 DIAGNOSIS — Z79899 Other long term (current) drug therapy: Secondary | ICD-10-CM | POA: Insufficient documentation

## 2012-11-09 DIAGNOSIS — Z8719 Personal history of other diseases of the digestive system: Secondary | ICD-10-CM | POA: Insufficient documentation

## 2012-11-09 DIAGNOSIS — S0990XA Unspecified injury of head, initial encounter: Secondary | ICD-10-CM

## 2012-11-09 DIAGNOSIS — E119 Type 2 diabetes mellitus without complications: Secondary | ICD-10-CM | POA: Insufficient documentation

## 2012-11-09 DIAGNOSIS — Z8739 Personal history of other diseases of the musculoskeletal system and connective tissue: Secondary | ICD-10-CM | POA: Insufficient documentation

## 2012-11-09 DIAGNOSIS — Y929 Unspecified place or not applicable: Secondary | ICD-10-CM | POA: Insufficient documentation

## 2012-11-09 LAB — GLUCOSE, CAPILLARY: Glucose-Capillary: 190 mg/dL — ABNORMAL HIGH (ref 70–99)

## 2012-11-09 NOTE — ED Notes (Signed)
Checked patient blood sugar it was 190 notified RN Lowella Bandy of [facility]

## 2012-11-09 NOTE — ED Notes (Signed)
[facility] called  

## 2012-11-09 NOTE — ED Notes (Signed)
Per EMS- pt was talking on the phone and got tripped on the cord. Pt fell backwards and has laceration to the back of her head.bleeding controled.  Denies LOC, neck or back pain. BP 180/100. Pt from [facility].

## 2012-11-09 NOTE — ED Provider Notes (Signed)
History    78 year old female coming in with a head laceration. Accident happened just prior to arrival. Patient was talking on the telephone and backed up, tripping over the cord. She struck the back of her head. No loss of consciousness. She denies any other pain aside from where she struck her head. No nausea or vomiting. No acute visual changes. No acute numbness, tingling or loss of strength. No extremity or hip pain. Per patient's medication list, she takes 81 mg of aspirin daily.  CSN: [id number]  Arrival date & time 11/09/12  0725   First MD Initiated Contact with Patient 11/09/12 [phone]      Chief Complaint  Patient presents with  . Fall    (Consider location/radiation/quality/duration/timing/severity/associated sxs/prior treatment) HPI  Past Medical History  Diagnosis Date  . Diabetes mellitus   . Hypertension   . Heart murmur   . Benign familial tremor   . Rosacea   . Diverticulitis   . Heart failure     diastolic heart failure  . TIA (transient ischemic attack) 2010  . Arthritis   . Breast cancer     breast -rt  . CAD (coronary artery disease)     single vessel CAD per cath in 2006; scattered nonobstructive disease in the left system, left to right collaterals to the RCA which was occluded by flush shots; she has been managed medically    Past Surgical History  Procedure Date  . Mastectomy partial / lumpectomy 11/02/2000    Right, with SLN  . Mastectomy partial / lumpectomy 05/15/2008    Left  . Breast lumpectomy 01/19/2010    right  . Mastectomy 03/31/2010    Right    No family history on file.  History  Substance Use Topics  . Smoking status: Never Smoker   . Smokeless tobacco: Not on file  . Alcohol Use: No    OB History    Grav Para Term Preterm Abortions TAB SAB Ect Mult Living                  Review of Systems  All systems reviewed and negative, other than as noted in HPI.   Allergies  Bee venom; Celebrex; Lidocaine; Lidocaine hcl;  Penicillins; and Procaine hcl  Home Medications   Current Outpatient Rx  Name  Route  Sig  Dispense  Refill  . ALUM & MAG HYDROXIDE-SIMETH 200-200-20 MG/5ML PO SUSP   Oral   Take 30 mLs by mouth every 6 (six) hours as needed. For upset stomach         . ASPIRIN 81 MG PO TABS   Oral   Take 81 mg by mouth daily.         [name] CALCIUM CARBONATE ANTACID 500 MG PO CHEW   Oral   Chew 1 tablet by mouth daily.           [name] CLOTRIMAZOLE 1 % EX CREA      Apply to affected area 2 times daily   15 g   0   . DIABETIC TUSSIN DM MAX ST PO   Oral   Take by mouth as needed.           [name] DOCUSATE SODIUM 100 MG PO CAPS   Oral   Take 100 mg by mouth daily.           . DULOXETINE HCL 30 MG PO CPEP   Oral   Take 30 mg by mouth daily.         [name]  FENTANYL 25 MCG/HR TD PT72   Transdermal   Place 1 patch onto the skin every 3 (three) days.           . FUROSEMIDE 40 MG PO TABS   Oral   Take 40 mg by mouth 2 (two) times daily.           [name] GLUCOSAMINE-CHONDROITIN 500-400 MG PO TABS   Oral   Take 1 tablet by mouth 3 (three) times daily.           [name] HYDROCODONE-ACETAMINOPHEN 5-325 MG PO TABS   Oral   Take 1 tablet by mouth every 6 (six) hours as needed.           [name] HYDROXYZINE HCL 10 MG PO TABS   Oral   Take 10 mg by mouth 3 (three) times daily as needed.           [name] NOVOLOG MIX 70/30 FLEXPEN [place]   Subcutaneous   Inject into the skin.           . ISOSORBIDE MONONITRATE ER 60 MG PO TB24   Oral   Take 60 mg by mouth daily.           [name] LEVOTHYROXINE SODIUM 75 MCG PO TABS   Oral   Take 75 mcg by mouth daily. Once weekly         . LORAZEPAM 0.5 MG PO TABS   Oral   Take 0.5 mg by mouth every 6 (six) hours as needed.           [name] LOSARTAN POTASSIUM 100 MG PO TABS   Oral   Take 100 mg by mouth daily.         . LUBIPROSTONE 24 MCG PO CAPS   Oral   Take 24 mcg by mouth 2 (two) times daily with a meal.           . CERTA-VITE-LUTEIN PO   Oral   Take 0.4  mg by mouth daily.           [name] NITROGLYCERIN 0.4 MG SL SUBL   Sublingual   Place 0.4 mg under the tongue every 5 (five) minutes as needed.           [name] OMEPRAZOLE 20 MG PO CPDR   Oral   Take 20 mg by mouth 2 (two) times daily.         [name] ALIGN PO   Oral   Take 4 mg by mouth daily.          [name] PROPRANOLOL HCL 10 MG PO TABS   Oral   Take 10 mg by mouth 2 (two) times daily.          [name] SALINE NASAL SPRAY 0.65 % NA SOLN   Nasal   Place 1 spray into the nose 4 (four) times daily.           [name] SPIRONOLACTONE 50 MG PO TABS   Oral   Take 50 mg by mouth daily.           . TRAZODONE HCL 100 MG PO TABS   Oral   Take 100 mg by mouth at bedtime.           [name] VITAMIN D (ERGOCALCIFEROL) 50000 UNITS PO CAPS   Oral   Take 50,000 Units by mouth every 30 (thirty) days.           There were no vitals taken for this visit.  Physical Exam  Nursing note and vitals reviewed. Constitutional: She appears well-developed and well-nourished. No distress.  HENT:  Head: Normocephalic.       Stellate laceration to upper occipital region. No active bleeding. Approximately 2 and half centimeters in total length. Small underlying hematoma.  Eyes: Conjunctivae normal are normal. Right eye exhibits no discharge. Left eye exhibits no discharge.  Neck: Neck supple.  Cardiovascular: Normal rate, regular rhythm and normal heart sounds.  Exam reveals no gallop and no friction rub.   No murmur heard. Pulmonary/Chest: Effort normal and breath sounds normal. No respiratory distress. She exhibits no tenderness.  Abdominal: Soft. She exhibits no distension. There is no tenderness.  Musculoskeletal: She exhibits no edema and no tenderness.       No midline spinal tenderness. No bony tenderness of extremities. Pelvis is stable to rocking. No pain on range of motion of either hip. Chronic appearing skin changes to lower extremities. Bilateral lower extremities are wrapped.  Neurological: She is alert.    Skin: Skin is warm and dry.  Psychiatric: She has a normal mood and affect. Her behavior is normal. Thought content normal.    ED Course  Procedures (including critical care time)  LACERATION REPAIR Performed by: Raeford Razor Authorized by: Raeford Razor Consent: Verbal consent obtained. Risks and benefits: risks, benefits and alternatives were discussed Consent given by: patient Patient identity confirmed: provided demographic data Prepped and Draped in normal sterile fashion Wound explored  Laceration Location: posterior scalp  Laceration Length:  3 cm  No Foreign Bodies seen or palpated  Anesthesia: local infiltration  Local anesthetic: none  Anesthetic total: n/a  Irrigation method: syringe Amount of cleaning: standard  Skin closure: single layer  Number of sutures: 4  Technique: staple  Patient tolerance: Patient tolerated the procedure well with no immediate complications.   Labs Reviewed - No data to display Ct Head Wo Contrast  11/09/2012  *RADIOLOGY REPORT*  Clinical Data:  Fall, laceration to posterior head, headache  CT HEAD WITHOUT CONTRAST CT CERVICAL SPINE WITHOUT CONTRAST  Technique:  Multidetector CT imaging of the head and cervical spine was performed following the standard protocol without intravenous contrast.  Multiplanar CT image reconstructions of the cervical spine were also generated.  Comparison:  CT head dated 05/03/2012  CT HEAD  Findings: No evidence of parenchymal hemorrhage or extra-axial fluid collection. No mass lesion, mass effect, or midline shift.  No CT evidence of acute infarction.  Subcortical white matter and periventricular small vessel ischemic changes.  Intracranial atherosclerosis.  Age related atrophy.  Secondary ventricular prominence.  The visualized paranasal sinuses are essentially clear. The mastoid air cells are unopacified.  Mild soft tissue swelling/irregularity overlying the posterior vertex.  No evidence of  calvarial fracture.  IMPRESSION: Mild soft tissue swelling/irregularity overlying the posterior vertex.  No evidence of calvarial fracture.  Atrophy with small vessel ischemic changes and intracranial atherosclerosis.  CT CERVICAL SPINE  Findings: Straightening of the lower cervical spine.  No evidence of fracture or dislocation.  Vertebral body heights are maintained.  The dens appears intact.  No prevertebral soft tissue swelling.  Vertebral fusion from C4-6.  Moderate multilevel degenerative changes.  Visualized thyroid is unremarkable.  Visualized lung apices are clear.  IMPRESSION: No evidence of traumatic injury to the cervical spine.  C4-6 fusion with moderate multilevel degenerative changes.   Original Report Authenticated By: Charline Bills, M.D.    Ct Cervical Spine Wo Contrast  11/09/2012  *RADIOLOGY REPORT*  Clinical Data:  Fall, laceration to posterior  head, headache  CT HEAD WITHOUT CONTRAST CT CERVICAL SPINE WITHOUT CONTRAST  Technique:  Multidetector CT imaging of the head and cervical spine was performed following the standard protocol without intravenous contrast.  Multiplanar CT image reconstructions of the cervical spine were also generated.  Comparison:  CT head dated 05/03/2012  CT HEAD  Findings: No evidence of parenchymal hemorrhage or extra-axial fluid collection. No mass lesion, mass effect, or midline shift.  No CT evidence of acute infarction.  Subcortical white matter and periventricular small vessel ischemic changes.  Intracranial atherosclerosis.  Age related atrophy.  Secondary ventricular prominence.  The visualized paranasal sinuses are essentially clear. The mastoid air cells are unopacified.  Mild soft tissue swelling/irregularity overlying the posterior vertex.  No evidence of calvarial fracture.  IMPRESSION: Mild soft tissue swelling/irregularity overlying the posterior vertex.  No evidence of calvarial fracture.  Atrophy with small vessel ischemic changes and intracranial  atherosclerosis.  CT CERVICAL SPINE  Findings: Straightening of the lower cervical spine.  No evidence of fracture or dislocation.  Vertebral body heights are maintained.  The dens appears intact.  No prevertebral soft tissue swelling.  Vertebral fusion from C4-6.  Moderate multilevel degenerative changes.  Visualized thyroid is unremarkable.  Visualized lung apices are clear.  IMPRESSION: No evidence of traumatic injury to the cervical spine.  C4-6 fusion with moderate multilevel degenerative changes.   Original Report Authenticated By: Charline Bills, M.D.      1. Scalp laceration   2. Head injury       MDM  78 year old female with a head injury. Given patient's age will CT. She is alert and appropriate. Nonfocal neurological examination and no neuro complaints. If CTs are normal, feel the patient can be discharged with head injury instructions. Laceration repair.   CT of the head and neck negative for emergent acute traumatic injury. Patient remains without neurological complaint. Scalp laceration was repaired. Continued wound care and head injury instructions were discussed. Outpatient followup as needed otherwise and for staple removal.      Raeford Razor, MD 11/09/12 [phone]

## 2012-11-13 ENCOUNTER — Emergency Department (HOSPITAL_COMMUNITY)
Admission: EM | Admit: 2012-11-13 | Discharge: 2012-11-13 | Disposition: A | Discharge status: Home / Self Care | Payer: Medicare Other | Attending: Emergency Medicine | Admitting: Emergency Medicine

## 2012-11-13 ENCOUNTER — Emergency Department (HOSPITAL_COMMUNITY): Payer: Medicare Other

## 2012-11-13 ENCOUNTER — Encounter (HOSPITAL_COMMUNITY): Payer: Self-pay

## 2012-11-13 ENCOUNTER — Other Ambulatory Visit: Payer: Self-pay

## 2012-11-13 DIAGNOSIS — R000 Tachycardia, unspecified: Secondary | ICD-10-CM

## 2012-11-13 DIAGNOSIS — Z79899 Other long term (current) drug therapy: Secondary | ICD-10-CM | POA: Insufficient documentation

## 2012-11-13 DIAGNOSIS — Z794 Long term (current) use of insulin: Secondary | ICD-10-CM | POA: Insufficient documentation

## 2012-11-13 DIAGNOSIS — I10 Essential (primary) hypertension: Secondary | ICD-10-CM | POA: Insufficient documentation

## 2012-11-13 DIAGNOSIS — E119 Type 2 diabetes mellitus without complications: Secondary | ICD-10-CM | POA: Insufficient documentation

## 2012-11-13 DIAGNOSIS — Z8673 Personal history of transient ischemic attack (TIA), and cerebral infarction without residual deficits: Secondary | ICD-10-CM | POA: Insufficient documentation

## 2012-11-13 DIAGNOSIS — Z8674 Personal history of sudden cardiac arrest: Secondary | ICD-10-CM | POA: Insufficient documentation

## 2012-11-13 DIAGNOSIS — Z8669 Personal history of other diseases of the nervous system and sense organs: Secondary | ICD-10-CM | POA: Insufficient documentation

## 2012-11-13 DIAGNOSIS — I2510 Atherosclerotic heart disease of native coronary artery without angina pectoris: Secondary | ICD-10-CM | POA: Insufficient documentation

## 2012-11-13 DIAGNOSIS — H5702 Anisocoria: Secondary | ICD-10-CM

## 2012-11-13 DIAGNOSIS — R519 Headache, unspecified: Secondary | ICD-10-CM

## 2012-11-13 DIAGNOSIS — Z853 Personal history of malignant neoplasm of breast: Secondary | ICD-10-CM | POA: Insufficient documentation

## 2012-11-13 DIAGNOSIS — M129 Arthropathy, unspecified: Secondary | ICD-10-CM | POA: Insufficient documentation

## 2012-11-13 DIAGNOSIS — N390 Urinary tract infection, site not specified: Secondary | ICD-10-CM

## 2012-11-13 DIAGNOSIS — R011 Cardiac murmur, unspecified: Secondary | ICD-10-CM | POA: Insufficient documentation

## 2012-11-13 DIAGNOSIS — Z872 Personal history of diseases of the skin and subcutaneous tissue: Secondary | ICD-10-CM | POA: Insufficient documentation

## 2012-11-13 LAB — URINALYSIS, ROUTINE W REFLEX MICROSCOPIC
Bilirubin Urine: NEGATIVE
Glucose, UA: 100 mg/dL — AB
Ketones, ur: NEGATIVE mg/dL
Nitrite: NEGATIVE
Specific Gravity, Urine: 1.013 (ref 1.005–1.030)
pH: 6.5 (ref 5.0–8.0)

## 2012-11-13 LAB — CBC WITH DIFFERENTIAL/PLATELET
Basophils Absolute: 0 10*3/uL (ref 0.0–0.1)
Basophils Relative: 0 % (ref 0–1)
Eosinophils Absolute: 0.2 10*3/uL (ref 0.0–0.7)
Eosinophils Relative: 2 % (ref 0–5)
MCH: 31.5 pg (ref 26.0–34.0)
MCHC: 32.1 g/dL (ref 30.0–36.0)
MCV: 98.1 fL (ref 78.0–100.0)
Neutrophils Relative %: 75 % (ref 43–77)
Platelets: 196 10*3/uL (ref 150–400)
RBC: 4.32 MIL/uL (ref 3.87–5.11)
RDW: 14.4 % (ref 11.5–15.5)

## 2012-11-13 LAB — COMPREHENSIVE METABOLIC PANEL
ALT: 19 U/L (ref 0–35)
Albumin: 3.6 g/dL (ref 3.5–5.2)
Alkaline Phosphatase: 74 U/L (ref 39–117)
Calcium: 9.8 mg/dL (ref 8.4–10.5)
GFR calc Af Amer: 63 mL/min — ABNORMAL LOW (ref >90)
Potassium: 4.5 mEq/L (ref 3.5–5.1)
Sodium: 139 mEq/L (ref 135–145)
Total Protein: 7.5 g/dL (ref 6.0–8.3)

## 2012-11-13 LAB — POCT I-STAT TROPONIN I: Troponin i, poc: 0.02 ng/mL (ref 0.00–0.08)

## 2012-11-13 LAB — URINE MICROSCOPIC-ADD ON

## 2012-11-13 MED ORDER — LORAZEPAM 2 MG/ML IJ SOLN
0.5000 mg | Freq: Once | INTRAMUSCULAR | Status: AC
  Administered 2012-11-13: 0.5 mg via INTRAVENOUS
  Filled 2012-11-13: qty 1

## 2012-11-13 MED ORDER — ACETAMINOPHEN 325 MG PO TABS
650.0000 mg | ORAL_TABLET | Freq: Once | ORAL | Status: AC
Start: 2012-11-13 — End: 2012-11-13
  Administered 2012-11-13: 650 mg via ORAL
  Filled 2012-11-13 (×2): qty 1

## 2012-11-13 MED ORDER — SODIUM CHLORIDE 0.9 % IV BOLUS (SEPSIS)
500.0000 mL | Freq: Once | INTRAVENOUS | Status: AC
  Administered 2012-11-13: 500 mL via INTRAVENOUS

## 2012-11-13 MED ORDER — CEFPODOXIME PROXETIL 200 MG PO TABS: 100.0000 mg | ORAL_TABLET | Freq: Two times a day (BID) | ORAL | Status: DC

## 2012-11-13 NOTE — ED Notes (Signed)
Pt. Refused in and out cath 

## 2012-11-13 NOTE — ED Notes (Signed)
Patient transported to CT 

## 2012-11-13 NOTE — ED Notes (Signed)
CORRECTION:  In and out cath NOT performed, so UA collection was NOT selected under "nursing procedures."

## 2012-11-13 NOTE — ED Provider Notes (Signed)
I saw and evaluated the patient, reviewed the resident's note and I agree with the finding including ECGs and plan.  This 78 year old had a recent fall and still has some mild posterior headache but no neck pain, she negative CT of the brain at that time, she returns because she was incidentally noted to have unequal pupils today but has no new headache no worsening headache no confusion no change in speech vision swallowing understanding no weakness numbness or incoordination, she is no chest pain palpitations shortness of breath but she does feel a bit anxious and states that whenever she gets anxious her heart beat is fast, she does have mild anisocoria but no change in vision  Hurman Horn, MD 11/15/12 2242

## 2012-11-13 NOTE — ED Notes (Signed)
Report called to Nurse Amil Amen at pt's nursing home

## 2012-11-13 NOTE — ED Notes (Signed)
Pt from [facility], EMS reports pt seen on Friday and all clear after fall, Today NP at [facility] found unequal pupils, intermittent confusion and headache

## 2012-11-13 NOTE — ED Provider Notes (Signed)
History     CSN: [id number]  Arrival date & time 11/13/12  1723   None     Chief Complaint  Patient presents with  . Headache     HPI chief complaint: Sent by physician. Onset of headache 3 days ago. Location posterior scalp. Not improved or worsened by anything. Quality: Dull. Timing: Constant. Context: Mechanical fall backwards. For associated signs and symptoms patient denies vision change, eye pain, hearing loss, weakness, slurred speech, numbness, vomiting. Regarding social history see nurse's notes. Have reviewed patient's past medical, past surgical, social history as well as medications and allergies  Past Medical History  Diagnosis Date  . Diabetes mellitus   . Hypertension   . Heart murmur   . Benign familial tremor   . Rosacea   . Diverticulitis   . Heart failure     diastolic heart failure  . TIA (transient ischemic attack) 2010  . Arthritis   . Breast cancer     breast -rt  . CAD (coronary artery disease)     single vessel CAD per cath in 2006; scattered nonobstructive disease in the left system, left to right collaterals to the RCA which was occluded by flush shots; she has been managed medically    Past Surgical History  Procedure Date  . Mastectomy partial / lumpectomy 11/02/2000    Right, with SLN  . Mastectomy partial / lumpectomy 05/15/2008    Left  . Breast lumpectomy 01/19/2010    right  . Mastectomy 03/31/2010    Right    History reviewed. No pertinent family history.  History  Substance Use Topics  . Smoking status: Never Smoker   . Smokeless tobacco: Not on file  . Alcohol Use: No    OB History    Grav Para Term Preterm Abortions TAB SAB Ect Mult Living                  Review of Systems 10 Systems reviewed and are negative for acute change except as noted in the HPI.  Allergies  Bee venom; Celebrex; Lidocaine; Lidocaine hcl; Penicillins; and Procaine hcl  Home Medications   Current Outpatient Rx  Name  Route  Sig  Dispense   Refill  . ALUM & MAG HYDROXIDE-SIMETH 200-200-20 MG/5ML PO SUSP   Oral   Take 30 mLs by mouth every 6 (six) hours as needed. For upset stomach         . DOCUSATE SODIUM 100 MG PO CAPS   Oral   Take 100 mg by mouth daily.           . DULOXETINE HCL 30 MG PO CPEP   Oral   Take 30 mg by mouth daily.         . FENTANYL 100 MCG/HR TD PT72   Transdermal   Place 1 patch onto the skin every 3 (three) days.         . FUROSEMIDE 40 MG PO TABS   Oral   Take 40 mg by mouth 2 (two) times daily.           [name] GLUCOSAMINE-CHONDROITIN 500-400 MG PO TABS   Oral   Take 1 tablet by mouth 3 (three) times daily.           [name] HYDROCODONE-ACETAMINOPHEN 5-325 MG PO TABS   Oral   Take 0.5 tablets by mouth every 6 (six) hours as needed.          [name] HYDROXYZINE HCL 10 MG PO TABS  Oral   Take 10 mg by mouth 3 (three) times daily as needed. For itching         . INSULIN ASPART 100 UNIT/ML [place] SOLN   Subcutaneous   Inject 5 Units into the skin 3 (three) times daily as needed. If CBG is greater than 150         . INSULIN GLARGINE 100 UNIT/ML [place] SOLN   Subcutaneous   Inject 18 Units into the skin at bedtime.         . ISOSORBIDE MONONITRATE ER 60 MG PO TB24   Oral   Take 60 mg by mouth daily.           [name] LEVOTHYROXINE SODIUM 75 MCG PO TABS   Oral   Take 112.5 mcg by mouth once a week. Saturdays         . LORAZEPAM 0.5 MG PO TABS   Oral   Take 0.5 mg by mouth every 12 (twelve) hours as needed. Severe agitation         . LOSARTAN POTASSIUM 100 MG PO TABS   Oral   Take 100 mg by mouth daily.         . LUBIPROSTONE 24 MCG PO CAPS   Oral   Take 24 mcg by mouth 2 (two) times daily with a meal.           . CERTA-VITE-LUTEIN PO   Oral   Take 0.4 mg by mouth daily.           [name] NITROGLYCERIN 0.4 MG SL SUBL   Sublingual   Place 0.4 mg under the tongue every 5 (five) minutes x 2 doses as needed. For chest pain         . OMEPRAZOLE 20 MG PO CPDR   Oral   Take  20 mg by mouth 2 (two) times daily.         [name] POLYETHYLENE GLYCOL 3350 PO PACK   Oral   Take 17 g by mouth daily.         [name] PREDNISONE 10 MG PO TABS   Oral   Take 10 mg by mouth daily.         [name] ALIGN PO   Oral   Take 4 mg by mouth daily.          [name] PROPRANOLOL HCL 10 MG PO TABS   Oral   Take 10 mg by mouth 2 (two) times daily.          [name] SALINE NASAL SPRAY 0.65 % NA SOLN   Nasal   Place 1 spray into the nose 4 (four) times daily.           [name] SPIRONOLACTONE 50 MG PO TABS   Oral   Take 25 mg by mouth daily.          [name] VITAMIN D (ERGOCALCIFEROL) 50000 UNITS PO CAPS   Oral   Take 50,000 Units by mouth every 30 (thirty) days.           BP 142/81  Physical Exam  Constitutional: She is oriented to person, place, and time. She appears well-developed and well-nourished. No distress.  HENT:  Head: Normocephalic and atraumatic.  Eyes: Conjunctivae normal are normal. Right eye exhibits no discharge. Left eye exhibits no discharge. No scleral icterus.  Neck: Normal range of motion. Neck supple.  Cardiovascular: Regular rhythm, normal heart sounds, intact distal pulses and normal pulses.  Tachycardia present.  Exam reveals no gallop.  No murmur heard. Pulmonary/Chest: Effort normal and breath sounds normal. No respiratory distress.  Abdominal: Soft. Bowel sounds are normal. She exhibits no distension. There is no tenderness.  Musculoskeletal: Normal range of motion. She exhibits no tenderness.  Neurological: She is alert and oriented to person, place, and time. She has normal strength. No cranial nerve deficit or sensory deficit. Coordination and gait normal. GCS eye subscore is 4. GCS verbal subscore is 5. GCS motor subscore is 6.       Scalp staples intact.   Skin: Skin is warm and dry. She is not diaphoretic.  Psychiatric: She has a normal mood and affect.    ED Course  Procedures (including critical care time)  Labs Reviewed  COMPREHENSIVE METABOLIC  PANEL - Abnormal; Notable for the following:    Glucose, Bld 255 (*)     BUN 47 (*)     GFR calc non Af Amer 54 (*)     GFR calc Af Amer 63 (*)     All other components within normal limits  URINALYSIS, ROUTINE W REFLEX MICROSCOPIC - Abnormal; Notable for the following:    Glucose, UA 100 (*)     Leukocytes, UA SMALL (*)     All other components within normal limits  CBC WITH DIFFERENTIAL  POCT I-STAT TROPONIN I  URINE MICROSCOPIC-ADD ON  TSH  URINE CULTURE   Ct Head Wo Contrast  11/13/2012  *RADIOLOGY REPORT*  Clinical Data: Headache.  Confusion.  Unequal pupils.  CT HEAD WITHOUT CONTRAST  Technique:  Contiguous axial images were obtained from the base of the skull through the vertex without contrast.  Comparison: 11/09/2012  Findings: Posterior scalp skin staples are seen.  No evidence of skull fracture.  No evidence of pneumocephalus.  There is no evidence of intracranial hemorrhage, brain edema or other signs of acute infarction.  There is no evidence of intracranial mass lesion or mass effect.  No abnormal extra-axial fluid collections are identified.  Mild cerebral atrophy and chronic small vessel disease are stable in appearance.  Ventricles are stable in size.  IMPRESSION:  1. No acute intracranial findings. 2.  Stable mild cerebral atrophy and chronic small vessel disease.   Original Report Authenticated By: Myles Rosenthal, M.D.    Dg Chest Portable 1 View  11/13/2012  *RADIOLOGY REPORT*  Clinical Data: Headache  PORTABLE CHEST - 1 VIEW  Comparison: Chest radiograph 06/10/2011  Findings: Stable enlarged heart silhouette.  No effusion, infiltrate, pneumothorax. No acute osseous abnormality.  IMPRESSION: Cardiomegaly without acute cardiopulmonary process.  The   Original Report Authenticated By: Genevive Bi, M.D.      1. Head ache   2. Anisocoria   3. UTI (lower urinary tract infection)   4. Tachycardia      EKG reviewed and interpreted. Sinus tachycardia rate 133. Left axis  deviation. Left anterior fascicular block. Normal intervals. No patient does have diffuse ST segment elevation which is been present on prior EKGs. The only new finding present on this EKG is T wave inversions in aVL. MDM  Patient is a very well-appearing 78 year old Caucasian female who had a mechanical fall 4 days ago requiring staples to scalp Laceration. Patient had a head CT at that time that was negative. Patient was seen in urgent care today for continued headache and was transferred here given she has unequal pupils. Patient denies confusion at this time. Patient also denies headache presently. Patient does have anisocoria with L pupil 5 mm and right pupil 3 mm, both  reactive. Given patient is alert, oriented x3 in no acute distress a brainstem herniation is extremely improbable at this time. Repeat CT head performed for continued headache and demonstrates no acute abnormalities. The patient was tachycardic on arrival with heart rate 140 sinus tach. Patient states any time she is in a new environment her heart does this. Patient avidly denies any chest pain, shortness of breath, palpitations, dizziness, syncope. This resolved shortly after arrival which patient states is the norm for her. Initial troponin was sent given the patient's elderly and can have an atypical presentation for acute coronary syndrome. We planed to send a delta troponin but the patient refused to stay and wait for this. The patient is alert and oriented x3 in no acute distress. I feel she is capable of making her own decisions. Patient also refused to stay for repeat EKG. Labs largely unremarkable with the exception of a urinary tract infection for which patient was discharged with antibiotic. Strict return precautions discussed. The patient verbalized understanding.        Consuello Masse, MD 11/14/12 0010  Consuello Masse, MD 11/14/12 [id number]

## 2012-11-14 ENCOUNTER — Emergency Department (HOSPITAL_COMMUNITY): Payer: Medicare Other

## 2012-11-14 ENCOUNTER — Emergency Department (HOSPITAL_COMMUNITY)
Admission: EM | Admit: 2012-11-14 | Discharge: 2012-11-14 | Disposition: A | Discharge status: Home / Self Care | Payer: Medicare Other | Attending: Emergency Medicine | Admitting: Emergency Medicine

## 2012-11-14 ENCOUNTER — Encounter (HOSPITAL_COMMUNITY): Payer: Self-pay | Admitting: Emergency Medicine

## 2012-11-14 DIAGNOSIS — Y939 Activity, unspecified: Secondary | ICD-10-CM | POA: Insufficient documentation

## 2012-11-14 DIAGNOSIS — E119 Type 2 diabetes mellitus without complications: Secondary | ICD-10-CM | POA: Insufficient documentation

## 2012-11-14 DIAGNOSIS — I2510 Atherosclerotic heart disease of native coronary artery without angina pectoris: Secondary | ICD-10-CM | POA: Insufficient documentation

## 2012-11-14 DIAGNOSIS — I10 Essential (primary) hypertension: Secondary | ICD-10-CM | POA: Insufficient documentation

## 2012-11-14 DIAGNOSIS — Z8673 Personal history of transient ischemic attack (TIA), and cerebral infarction without residual deficits: Secondary | ICD-10-CM | POA: Insufficient documentation

## 2012-11-14 DIAGNOSIS — Z79899 Other long term (current) drug therapy: Secondary | ICD-10-CM | POA: Insufficient documentation

## 2012-11-14 DIAGNOSIS — Z8739 Personal history of other diseases of the musculoskeletal system and connective tissue: Secondary | ICD-10-CM | POA: Insufficient documentation

## 2012-11-14 DIAGNOSIS — W19XXXA Unspecified fall, initial encounter: Secondary | ICD-10-CM | POA: Insufficient documentation

## 2012-11-14 DIAGNOSIS — Y929 Unspecified place or not applicable: Secondary | ICD-10-CM | POA: Insufficient documentation

## 2012-11-14 DIAGNOSIS — Z7982 Long term (current) use of aspirin: Secondary | ICD-10-CM | POA: Insufficient documentation

## 2012-11-14 DIAGNOSIS — R011 Cardiac murmur, unspecified: Secondary | ICD-10-CM | POA: Insufficient documentation

## 2012-11-14 DIAGNOSIS — S0993XA Unspecified injury of face, initial encounter: Secondary | ICD-10-CM | POA: Insufficient documentation

## 2012-11-14 DIAGNOSIS — I5030 Unspecified diastolic (congestive) heart failure: Secondary | ICD-10-CM | POA: Insufficient documentation

## 2012-11-14 DIAGNOSIS — Z853 Personal history of malignant neoplasm of breast: Secondary | ICD-10-CM | POA: Insufficient documentation

## 2012-11-14 NOTE — ED Notes (Signed)
Bandages removed from pt BLE. Severe erythema present with a diabetic foot ulcer on the bottom of the left foot. Pt also has several skin tears. Pt's bottom is bright red in color without blanching, Stage 1 PU. Also, rash noted on torso, abdomen and upper legs bilaterally.

## 2012-11-14 NOTE — ED Notes (Signed)
Having to continuously redirect pt to reposition in the bed and not get up with out assistance.

## 2012-11-14 NOTE — ED Notes (Signed)
Per EMS, pt was at [facility] when she fell from standing. This is the 2nd fall in the last 24h. Last time was admitted to [facility] where she was treated and released. Pt also has several staples in the occipital lobe from a fall on Friday. Pt is not on blood thinners.

## 2012-11-14 NOTE — ED Provider Notes (Signed)
History     CSN: [id number]  Arrival date & time 11/14/12  4098   First MD Initiated Contact with Patient 11/14/12 0301      Chief Complaint  Patient presents with  . Fall    (Consider location/radiation/quality/duration/timing/severity/associated sxs/prior treatment) HPI History provided by EMS, patient and the nursing home. Resides at nursing facility and has had multiple recent falls. Was evaluated here last Friday and received staples for head laceration. Patient is not on blood thinner. Tonight she recalls falling from a standing position, she denies any LOC, she has had some bleeding from her previous head wound. She has some mild headache associated with this. She denies any other complaints. No fevers. No vomiting. Patient is a reliable historian. No neck pain. No weakness or numbness. Past Medical History  Diagnosis Date  . Diabetes mellitus   . Hypertension   . Heart murmur   . Benign familial tremor   . Rosacea   . Diverticulitis   . Heart failure     diastolic heart failure  . TIA (transient ischemic attack) 2010  . Arthritis   . Breast cancer     breast -rt  . CAD (coronary artery disease)     single vessel CAD per cath in 2006; scattered nonobstructive disease in the left system, left to right collaterals to the RCA which was occluded by flush shots; she has been managed medically    Past Surgical History  Procedure Date  . Mastectomy partial / lumpectomy 11/02/2000    Right, with SLN  . Mastectomy partial / lumpectomy 05/15/2008    Left  . Breast lumpectomy 01/19/2010    right  . Mastectomy 03/31/2010    Right    History reviewed. No pertinent family history.  History  Substance Use Topics  . Smoking status: Never Smoker   . Smokeless tobacco: Not on file  . Alcohol Use: No    OB History    Grav Para Term Preterm Abortions TAB SAB Ect Mult Living                  Review of Systems  Constitutional: Negative for fever and chills.  HENT: Negative  for neck pain and neck stiffness.   Eyes: Negative for pain.  Respiratory: Negative for shortness of breath.   Cardiovascular: Negative for chest pain.  Gastrointestinal: Negative for abdominal pain.  Genitourinary: Negative for dysuria.  Musculoskeletal: Negative for back pain.  Skin: Negative for rash.  Neurological: Negative for syncope and light-headedness.  All other systems reviewed and are negative.    Allergies  Penicillins; Bee venom; Celebrex; Lidocaine hcl; Phenobarbital; and Procaine hcl  Home Medications   Current Outpatient Rx  Name  Route  Sig  Dispense  Refill  . AYR SALINE NASAL GEL NA   Nasal   Place 1 application into the nose 2 (two) times daily. Both nostrils         . ALUM & MAG HYDROXIDE-SIMETH 200-200-20 MG/5ML PO SUSP   Oral   Take 30 mLs by mouth every 6 (six) hours as needed. For upset stomach         . ALUM & MAG HYDROXIDE-SIMETH 200-200-20 MG/5ML PO SUSP   Topical   Apply 15 mLs topically 2 (two) times daily. Apply to buttocks         . ASPIRIN 81 MG PO CHEW   Oral   Chew 81 mg by mouth every morning.          [name]  CIPROFLOXACIN HCL 250 MG PO TABS   Oral   Take 250 mg by mouth 2 (two) times daily.         [name] CLOTRIMAZOLE 1 % EX CREA   Topical   Apply 1 application topically 2 (two) times daily. To groin/buttocks         . DOCUSATE SODIUM 100 MG PO CAPS   Oral   Take 200 mg by mouth at bedtime.          . DULOXETINE HCL 30 MG PO CPEP   Oral   Take 30 mg by mouth every morning.          . FENTANYL 100 MCG/HR TD PT72   Transdermal   Place 1 patch onto the skin every 3 (three) days.         . FUROSEMIDE 40 MG PO TABS   Oral   Take 40 mg by mouth 2 (two) times daily.           [name] GLUCOSAMINE-CHONDROITIN 500-400 MG PO TABS   Oral   Take 1 tablet by mouth 3 (three) times daily.           . GUAIFENESIN 100 MG/5ML PO SYRP   Oral   Take 300 mg by mouth every 8 (eight) hours as needed. For cough         .  HYDROCODONE-ACETAMINOPHEN 10-325 MG PO TABS   Oral   Take 0.5 tablets by mouth every 4 (four) hours as needed. For pain         . HYDROXYZINE HCL 10 MG PO TABS   Oral   Take 10 mg by mouth 3 (three) times daily as needed. For itching         . HYDROXYZINE HCL 25 MG PO TABS   Oral   Take 25 mg by mouth at bedtime as needed. For itching         . INSULIN ASPART 100 UNIT/ML [place] SOLN   Subcutaneous   Inject 5 Units into the skin 3 (three) times daily as needed. If CBG is greater than 150         . INSULIN GLARGINE 100 UNIT/ML [place] SOLN   Subcutaneous   Inject 18 Units into the skin at bedtime.         . ISOSORBIDE MONONITRATE ER 60 MG PO TB24   Oral   Take 60 mg by mouth every morning.          [name] LEVOTHYROXINE SODIUM 75 MCG PO TABS   Oral   Take 75-112.5 mcg by mouth daily. Take 1.5 tablets (112.80mcg) on Saturday. Take 1 tablet (75 mcg) daily all other days of the week         . LORAZEPAM 0.5 MG PO TABS   Oral   Take 1 mg by mouth every 12 (twelve) hours as needed. Severe agitation         . LOSARTAN POTASSIUM 100 MG PO TABS   Oral   Take 100 mg by mouth every morning.          . LUBIPROSTONE 24 MCG PO CAPS   Oral   Take 24 mcg by mouth 2 (two) times daily with a meal.           . CERTA-VITE-LUTEIN PO   Oral   Take 1 tablet by mouth daily.          . RESOURCE ARGINAID PO   Oral   Take 1 packet by mouth  2 (two) times daily.         . NYSTATIN 100000 UNIT/GM EX CREA   Topical   Apply 1 application topically 2 (two) times daily. To rash on abdomen, back and thighs         . NYSTATIN-TRIAMCINOLONE 100000-0.1 UNIT/GM-% EX CREA   Topical   Apply 1 application topically 2 (two) times daily. To reddened and scaly area         . OMEPRAZOLE 20 MG PO CPDR   Oral   Take 20 mg by mouth 2 (two) times daily.         [name] OXYMETAZOLINE HCL 0.05 % NA SOLN   Nasal   Place 2 sprays into the nose 2 (two) times daily as needed. For congestion         .  POLYETHYLENE GLYCOL 3350 PO PACK   Oral   Take 17 g by mouth daily.         [name] PREDNISONE 10 MG PO TABS   Oral   Take 10 mg by mouth every morning.          [name] ALIGN 4 MG PO CAPS   Oral   Take 1 capsule by mouth daily.         [name] PROPRANOLOL HCL 10 MG PO TABS   Oral   Take 10 mg by mouth 2 (two) times daily.          [name] SALINE NASAL SPRAY 0.65 % NA SOLN   Nasal   Place 1 spray into the nose as needed. For dry nose         . SPIRONOLACTONE 25 MG PO TABS   Oral   Take 25 mg by mouth 2 (two) times daily.         [name] VITAMIN D (ERGOCALCIFEROL) 50000 UNITS PO CAPS   Oral   Take 50,000 Units by mouth every 30 (thirty) days. On the first of the month         . NITROGLYCERIN 0.4 MG SL SUBL   Sublingual   Place 0.4 mg under the tongue every 5 (five) minutes as needed. For chest pain         . SACCHAROMYCES BOULARDII 250 MG PO CAPS   Oral   Take 250 mg by mouth 2 (two) times daily.           BP 169/69  Pulse 77  Temp 99.1 F (37.3 C) (Rectal)  Resp 20  SpO2 100%  Physical Exam  Constitutional: She is oriented to person, place, and time. She appears well-developed and well-nourished.  HENT:  Head: Normocephalic.       Staples in place to occipital laceration with mild bleeding and tenderness to palpation. No associated cervical spine tenderness or deformity  Eyes: EOM are normal. Pupils are equal, round, and reactive to light.  Neck: Neck supple.  Cardiovascular: Normal rate, regular rhythm and intact distal pulses.   Pulmonary/Chest: Effort normal and breath sounds normal. No respiratory distress. She exhibits no tenderness.  Abdominal: Soft. Bowel sounds are normal. She exhibits no distension. There is no tenderness.  Musculoskeletal: Normal range of motion. She exhibits no edema.  Neurological: She is alert and oriented to person, place, and time.  Skin: Skin is warm and dry.    ED Course  Procedures (including critical care time)  Labs Reviewed - No  data to display Ct Head Wo Contrast  11/14/2012  *RADIOLOGY REPORT*  Clinical Data: Status post fall; concern for  head injury.  CT HEAD WITHOUT CONTRAST  Technique:  Contiguous axial images were obtained from the base of the skull through the vertex without contrast.  Comparison: CT of the head performed 11/13/2012  Findings: There is no evidence of acute infarction, mass lesion, or intra- or extra-axial hemorrhage on CT.  Prominence of the ventricles and sulci reflects mild cortical volume loss.  Diffuse periventricular and subcortical white matter change likely reflects small vessel ischemic microangiopathy.  A tiny chronic infarct is noted at the high right parietal lobe. Cerebellar atrophy is noted.  Small chronic lacunar infarcts are seen at the basal ganglia bilaterally.  The brainstem and fourth ventricle are within normal limits.  No mass effect or midline shift is seen.  There is no evidence of fracture; visualized osseous structures are unremarkable in appearance.  The visualized portions of the orbits are within normal limits.  The paranasal sinuses and mastoid air cells are well-aerated.  Soft tissue swelling is noted overlying the posterior parietal calvarium, with scattered soft tissue air and associated skin staples.  IMPRESSION:  1.  No evidence of traumatic intracranial injury or fracture. 2.  Soft tissue swelling overlying the posterior parietal calvarium, with scattered soft tissue air and associated skin staples. 3.  Mild cortical volume loss and diffuse small vessel ischemic microangiopathy. 4.  Tiny chronic infarct at the high right parietal lobe; small chronic lacunar infarcts at the basal ganglia bilaterally.   Original Report Authenticated By: Tonia Ghent, M.D.    Ct Head Wo Contrast  11/13/2012  *RADIOLOGY REPORT*  Clinical Data: Headache.  Confusion.  Unequal pupils.  CT HEAD WITHOUT CONTRAST  Technique:  Contiguous axial images were obtained from the base of the skull through the  vertex without contrast.  Comparison: 11/09/2012  Findings: Posterior scalp skin staples are seen.  No evidence of skull fracture.  No evidence of pneumocephalus.  There is no evidence of intracranial hemorrhage, brain edema or other signs of acute infarction.  There is no evidence of intracranial mass lesion or mass effect.  No abnormal extra-axial fluid collections are identified.  Mild cerebral atrophy and chronic small vessel disease are stable in appearance.  Ventricles are stable in size.  IMPRESSION:  1. No acute intracranial findings. 2.  Stable mild cerebral atrophy and chronic small vessel disease.   Original Report Authenticated By: Myles Rosenthal, M.D.    Dg Chest Portable 1 View  11/13/2012  *RADIOLOGY REPORT*  Clinical Data: Headache  PORTABLE CHEST - 1 VIEW  Comparison: Chest radiograph 06/10/2011  Findings: Stable enlarged heart silhouette.  No effusion, infiltrate, pneumothorax. No acute osseous abnormality.  IMPRESSION: Cardiomegaly without acute cardiopulmonary process.  The   Original Report Authenticated By: Genevive Bi, M.D.      1. Fall     Local injection of previous occipital wound 2 cc of 1% lidocaine. Wound irrigated and staples in place without any new or laceration. Bleeding improved with pressure. Dressing placed.  MDM   Fall with wound care as above. CT scan reviewed as above. No indication for further workup at this time stable for discharge home and outpatient followup  Vital signs and nursing notes reviewed. Old records reviewed.       Sunnie Nielsen, MD 11/14/12 [phone]

## 2012-11-14 NOTE — ED Notes (Signed)
[facility] called to transport pt back to [facility].

## 2012-11-14 NOTE — ED Notes (Signed)
Patient up walking around room

## 2012-11-14 NOTE — ED Notes (Signed)
QQV:[id number]<OV> Expected date:11/14/12<BR> Expected time: 2:03 AM<BR> Means of arrival:Ambulance<BR> Comments:<BR> fall

## 2012-11-14 NOTE — ED Notes (Signed)
Pt is more confused since arriving and keeps trying to get out of bed.

## 2012-11-14 NOTE — ED Notes (Addendum)
Pt has a lac to her occipital lobe that has staples in place. It is covered with dried blood and painful to touch. Pt becomes upset whenever it is disturbed. Pt arrived on LSB, which was cleared upon arrival. Pt able to move all extremities. Both LE are covered with wrap and boots and severe edema is present bilaterally.

## 2012-11-15 LAB — URINE CULTURE: Colony Count: >=100000

## 2012-11-29 ENCOUNTER — Encounter (HOSPITAL_BASED_OUTPATIENT_CLINIC_OR_DEPARTMENT_OTHER): Payer: Medicare Other

## 2012-12-20 LAB — CBC AND DIFFERENTIAL
Platelets: 184 10*3/uL (ref 150–399)
WBC: 8.3 10^3/mL

## 2013-01-24 LAB — BASIC METABOLIC PANEL
BUN: 41 mg/dL — AB (ref 4–21)
Creatinine: 1.1 mg/dL (ref 0.5–1.1)
Potassium: 4.6 mmol/L (ref 3.4–5.3)

## 2013-01-24 LAB — HEMOGLOBIN A1C: Hgb A1c MFr Bld: 9.8 % — AB (ref 4.0–6.0)

## 2013-01-29 MED ORDER — FENTANYL 100 MCG/HR TD PT72: MEDICATED_PATCH | TRANSDERMAL | Status: DC

## 2013-01-30 ENCOUNTER — Encounter: Payer: Self-pay | Admitting: Nurse Practitioner

## 2013-01-30 ENCOUNTER — Non-Acute Institutional Stay (SKILLED_NURSING_FACILITY): Payer: 59 | Admitting: Nurse Practitioner

## 2013-01-30 DIAGNOSIS — L03115 Cellulitis of right lower limb: Secondary | ICD-10-CM | POA: Insufficient documentation

## 2013-01-30 DIAGNOSIS — M7989 Other specified soft tissue disorders: Secondary | ICD-10-CM

## 2013-01-30 MED ORDER — SULFAMETHOXAZOLE-TMP DS 800-160 MG PO TABS: 1.0000 | ORAL_TABLET | Freq: Two times a day (BID) | ORAL | Status: DC

## 2013-01-30 NOTE — Progress Notes (Signed)
Subjective:     Patient ID: [name], female   DOB: September 02, 1926, 79 y.o.   MRN: [id number]  HPI REASSESSMENT OF ONGOING PROBLEMS:   174.9-NEOPLASM, BREAST  breast cancer with right partial mastectomy and lymph node biopsy in 2001.  244.9-HYPOTHYROIDISM The hypothyroidism is stable.on Levothyroxine 112.2mcg and alternating,  TSH 2.007 09/24/12 250.02-DM, UNCOMPLICATED TYPE II, UNCONTROLLED The diabetes has not changed. On insulin-adjusting. Hemoglobin A1c 6.8 05/21/12--7.3 10/15/12 268-VITAMIN D DEFICIENCY  Vitamin D 37.5 04/15/11 272.2-MIXED HYPERLIPIDEMIA The patient's most recent LDL is at goal. 300.00-ANXIETY The anxiety has improved.off Trazodone. Since Cymbalta started 04/13/12 333.1-ESSENTIAL AND OTHER SPECIFIED FORMS OF TREMOR  not disabling. Takes Propranolol 10mg  bid.  401.9-HTN UNSPECIFIED  controlled, taking Diovan 414.01-CAD The angina has been stable.risk reduction. Isosorbide, recent x1, last saw Dr. Patty Sermons 10/02/12--f/u 6 months and EKG 459.81-VENOUS INSUFFIENCY  persistent lower leg edema--better.  Furosemide and Spironolactone. Prednisone 10mg  for chronic inflammation.  564.00-CONSTIPATION The symptoms are stable. 585.3-CHRONIC KIDNEY DISEASE, STAGE III (MODERATE)   Diuretics are aggravating her prerenal azotemia.  707.15-ULCER, FOOT  left medial forefoot--enlarging.  715.90-OSTEOARTHRITIS   Broken neck 1993, torn meniscus right knee 1995 and 2006, had cortisone shots for right foot spur. 724.2-PAIN LOWER BACK  Managing pain with Fentanyl and hydrocodone.  780.52-INSOMNIA, UNSPECIFIED  better 782.3-EDEMA  chronic, much worse in the LLE 3+  Review of Systems  Constitutional: Positive for fatigue.  HENT: Positive for hearing loss. Negative for congestion, neck pain and neck stiffness.   Eyes: Negative for redness.  Respiratory: Positive for cough.   Cardiovascular: Positive for leg swelling.  Gastrointestinal: Positive for constipation.  Endocrine: Negative for cold  intolerance, heat intolerance, polydipsia, polyphagia and polyuria.  Genitourinary: Positive for frequency. Negative for urgency.  Musculoskeletal: Positive for back pain and gait problem.  Neurological: Positive for tremors. Negative for speech difficulty and headaches.  Psychiatric/Behavioral: Negative for agitation.    Objective:   Physical Exam  Constitutional: She appears well-developed.  HENT:  Head: Normocephalic and atraumatic.  Left Ear: External ear normal.  Eyes: Conjunctivae and EOM are normal. Pupils are equal, round, and reactive to light.  Neck: Neck supple. No JVD present. No thyromegaly present.  Cardiovascular: Normal rate and regular rhythm.   Murmur heard. Pulmonary/Chest: Effort normal.  Abdominal: Soft. There is no tenderness. There is no rebound.  Musculoskeletal: She exhibits edema.  Lymphadenopathy:    She has no cervical adenopathy.  Neurological: She is alert. No cranial nerve deficit. Coordination normal.  Skin: Skin is warm and dry. Rash noted.     Swelling, fever, warmth, tenderness, scaly, weeping  Psychiatric: Her mood appears anxious. She exhibits abnormal recent memory.       Assessment:     Erythematous BLE, scaly, warmth, tenderness, superficial fissures forming with serous drainage in moderate amount.   HTN: controlled and no further hypertensive episode.    Diabetic foot ulcer left forefoot--no change.  Plan:  Will cleanse the affected areas with soap and water and let dry, then apply Mycolog II bid and cover with no adhesive dressing. Septra DS I bid for total 10 days per wound culture.    HTN no change of medications  Diabetic foot ulcer pressure reduction with better fitted shoes and good personal hygiene consistently.

## 2013-02-12 ENCOUNTER — Encounter: Payer: Self-pay | Admitting: Nurse Practitioner

## 2013-02-12 NOTE — Progress Notes (Signed)
This encounter was created in error - please disregard.

## 2013-02-26 ENCOUNTER — Encounter: Payer: Self-pay | Admitting: Nurse Practitioner

## 2013-02-26 ENCOUNTER — Non-Acute Institutional Stay (SKILLED_NURSING_FACILITY): Payer: 59 | Admitting: Nurse Practitioner

## 2013-02-26 DIAGNOSIS — E039 Hypothyroidism, unspecified: Secondary | ICD-10-CM

## 2013-02-26 DIAGNOSIS — K5900 Constipation, unspecified: Secondary | ICD-10-CM

## 2013-02-26 DIAGNOSIS — F329 Major depressive disorder, single episode, unspecified: Secondary | ICD-10-CM | POA: Insufficient documentation

## 2013-02-26 DIAGNOSIS — E1159 Type 2 diabetes mellitus with other circulatory complications: Secondary | ICD-10-CM

## 2013-02-26 DIAGNOSIS — M549 Dorsalgia, unspecified: Secondary | ICD-10-CM

## 2013-02-26 DIAGNOSIS — M7989 Other specified soft tissue disorders: Secondary | ICD-10-CM

## 2013-02-26 DIAGNOSIS — I259 Chronic ischemic heart disease, unspecified: Secondary | ICD-10-CM

## 2013-02-26 DIAGNOSIS — G250 Essential tremor: Secondary | ICD-10-CM | POA: Insufficient documentation

## 2013-02-26 DIAGNOSIS — I10 Essential (primary) hypertension: Secondary | ICD-10-CM

## 2013-02-26 DIAGNOSIS — E1142 Type 2 diabetes mellitus with diabetic polyneuropathy: Secondary | ICD-10-CM | POA: Insufficient documentation

## 2013-02-26 NOTE — Assessment & Plan Note (Signed)
Still had angina occasionally, f/u Cardiology, continue with Imdur 60mg and Prn NTG(last dose 02/12/13)                         

## 2013-02-26 NOTE — Assessment & Plan Note (Signed)
Controlled on Losartan 100 mg daily.

## 2013-02-26 NOTE — Assessment & Plan Note (Signed)
Stable on Cymbalta 30mg  

## 2013-02-26 NOTE — Assessment & Plan Note (Signed)
Corrected with Levothyroxine 112.69mcg, last TSH 3.677 01/24/13

## 2013-02-26 NOTE — Assessment & Plan Note (Signed)
Not controlled, Hgb A1c 9.8 01/24/13, CBG am 78-230, noon 152-351, pm 149-263, continue Lantus 24units daily, change Novolog to 8 units with breakfast/dinner, 12 units with lunch for CBG>100

## 2013-02-26 NOTE — Assessment & Plan Note (Signed)
Stable on colace 100 x2 qhs and Amitiza bid

## 2013-02-26 NOTE — Progress Notes (Signed)
Patient ID: [name], female   DOB: 11-May-1926, 79 y.o.   MRN: [id number]  Chief Complaint:  Chief Complaint  Patient presents with  . Medical Managment of Chronic Issues    blood sugar.      HPI:   To evaluate blood sugar--Hgb A1c has been trending up to 9.8 01/24/13 Problem List Items Addressed This Visit     ICD-9-CM   Ischemic heart disease     Still had angina occasionally, f/u Cardiology, continue with Imdur 60mg  and Prn NTG(last dose 02/12/13)    Leg swelling     Chronic venous dermatitis and on Prednisone 10mg , presently takes Furosemide 60mg  am, 40mg  pm, Spironolactone 25mg  bid--Bun/creat 41/1.11 01/24/13    Type II or unspecified type diabetes mellitus with peripheral circulatory disorders, uncontrolled(250.72) - Primary     Not controlled, Hgb A1c 9.8 01/24/13, CBG am 78-230, noon 152-351, pm 149-263, continue Lantus 24units daily, change Novolog to 8 units with breakfast/dinner, 12 units with lunch for CBG>100    Unspecified hypothyroidism     Corrected with Levothyroxine 112.96mcg, last TSH 3.677 01/24/13    Depression     Stable on Cymbalta 30mg      Essential tremor     Mild head and hands tremor-not disabling. Takes Propranolol 10mg  bid    Back pain     Chronic, pain is controlled with Fentanyl 152mcg/hr and Norco 10/325 q4hr    HTN (hypertension)     Controlled on Losartan 100mg  daily    Unspecified constipation     Stable on colace 100 x2 qhs and Amitiza bid       Review of Systems:  Review of Systems  Constitutional: Negative for fever, chills, weight loss, malaise/fatigue and diaphoresis.  HENT: Positive for hearing loss. Negative for congestion, sore throat, neck pain and ear discharge.   Eyes: Negative for pain, discharge and redness.  Respiratory: Positive for shortness of breath. Negative for cough, sputum production and wheezing.   Cardiovascular: Positive for chest pain, palpitations, leg swelling and PND. Negative for orthopnea and  claudication.  Gastrointestinal: Negative for heartburn, nausea, vomiting, abdominal pain, diarrhea, constipation and blood in stool.  Genitourinary: Positive for frequency. Negative for dysuria, urgency and flank pain.  Musculoskeletal: Positive for myalgias, back pain, joint pain and falls.  Skin: Positive for rash (chronic BLE venous dermatitis. Left medial forefoot diabetic foot ulcer). Negative for itching.  Neurological: Positive for tremors. Negative for dizziness, tingling, sensory change, speech change, focal weakness, seizures, loss of consciousness, weakness and headaches.  Endo/Heme/Allergies: Negative for environmental allergies and polydipsia. Does not bruise/bleed easily.  Psychiatric/Behavioral: Positive for memory loss. Negative for depression and hallucinations. The patient is nervous/anxious. The patient does not have insomnia.      Medications: Review[facility] at [facility]   Physical Exam: Physical Exam  Constitutional: She appears well-developed.  HENT:  Head: Normocephalic and atraumatic.  Left Ear: External ear normal.  Eyes: Conjunctivae and EOM are normal. Pupils are equal, round, and reactive to light.  Neck: Neck supple. No JVD present. No thyromegaly present.  Cardiovascular: Normal rate and regular rhythm.   Murmur heard. Pulmonary/Chest: Effort normal.  Abdominal: Soft. There is no tenderness. There is no rebound.  Musculoskeletal: She exhibits edema.  Lymphadenopathy:    She has no cervical adenopathy.  Neurological: She is alert. No cranial nerve deficit. Coordination normal.  Skin: Skin is warm and dry. Rash noted.     Swelling, fever, warmth, tenderness, scaly, weeping  Psychiatric: Her mood appears anxious.  Her affect is not blunt, not labile and not inappropriate. Her speech is rapid and/or pressured (occasionally ). Her speech is not delayed and not slurred. She is hyperactive. She is not agitated, not aggressive, not slowed, not withdrawn, not actively  hallucinating and not combative. Thought content is not paranoid and not delusional. She does not exhibit a depressed mood. She exhibits abnormal recent memory.     Filed Vitals:   02/26/13 1015  BP: 130/68  Pulse: 76  Temp: 97.8 F (36.6 C)  TempSrc: Tympanic  Resp: 18      Labs reviewed: Basic Metabolic Panel:  Recent Labs  16/10/96 2030 11/13/12 1852 01/24/13  NA 141 139 139  K 3.8 4.5 4.6  CL 101 98  --   CO2 33* 29  --   GLUCOSE 129* 255*  --   BUN 41* 47* 41*  CREATININE 0.99 0.94 1.1  CALCIUM 9.3 9.8  --   TSH  --  1.511 3.68    Liver Function Tests:  Recent Labs  11/13/12 1852  AST 21  ALT 19  ALKPHOS 74  BILITOT 0.4  PROT 7.5  ALBUMIN 3.6    CBC:  Recent Labs  05/03/12 2030 11/13/12 1852 12/20/12  WBC 8.1 7.8 8.3  NEUTROABS 5.8 5.8  --   HGB 12.8 13.6 14.3  HCT 38.6 42.4 42  MCV 98.0 98.1  --   PLT 227 196 184    Anemia Panel: No results found for this basename: IRON, FOLATE, VITAMINB12,  in the last 8760 hours  Significant Diagnostic Results:     Assessment/Plan Type II or unspecified type diabetes mellitus with peripheral circulatory disorders, uncontrolled(250.72) Not controlled, Hgb A1c 9.8 01/24/13, CBG am 78-230, noon 152-351, pm 149-263, continue Lantus 24units daily, change Novolog to 8 units with breakfast/dinner, 12 units with lunch for CBG>100  Unspecified hypothyroidism Corrected with Levothyroxine 112.21mcg, last TSH 3.677 01/24/13  Ischemic heart disease Still had angina occasionally, f/u Cardiology, continue with Imdur 60mg  and Prn NTG(last dose 02/12/13)  Leg swelling Chronic venous dermatitis and on Prednisone 10mg , presently takes Furosemide 60mg  am, 40mg  pm, Spironolactone 25mg  bid--Bun/creat 41/1.11 01/24/13  Depression Stable on Cymbalta 30mg    Essential tremor Mild head and hands tremor-not disabling. Takes Propranolol 10mg  bid  Back pain Chronic, pain is controlled with Fentanyl 141mcg/hr and Norco  10/325 q4hr  HTN (hypertension) Controlled on Losartan 100mg  daily  Unspecified constipation Stable on colace 100 x2 qhs and Amitiza bid      Family/ staff Communication: close monitoring of blood sugar and s/s hypoglycemia.    Goals of care: continue SNF   Labs/tests ordered none

## 2013-02-26 NOTE — Assessment & Plan Note (Signed)
Mild head and hands tremor-not disabling. Takes Propranolol 10mg bid                                              

## 2013-02-26 NOTE — Assessment & Plan Note (Signed)
Chronic venous dermatitis and on Prednisone 10mg , presently takes Furosemide 60mg  am, 40mg  pm, Spironolactone 25mg  bid--Bun/creat 41/1.11 01/24/13

## 2013-02-26 NOTE — Assessment & Plan Note (Addendum)
Chronic, pain is controlled with Fentanyl 100mcg/hr and Norco 10/325 q4hr                                         

## 2013-03-12 ENCOUNTER — Encounter: Payer: Self-pay | Admitting: Cardiology

## 2013-03-25 ENCOUNTER — Ambulatory Visit (INDEPENDENT_AMBULATORY_CARE_PROVIDER_SITE_OTHER): Payer: 59 | Admitting: Cardiology

## 2013-03-25 ENCOUNTER — Encounter: Payer: Self-pay | Admitting: Cardiology

## 2013-03-25 VITALS — BP 128/72 | HR 84 | Ht 67.5 in | Wt 187.2 lb

## 2013-03-25 DIAGNOSIS — L03115 Cellulitis of right lower limb: Secondary | ICD-10-CM

## 2013-03-25 DIAGNOSIS — L02419 Cutaneous abscess of limb, unspecified: Secondary | ICD-10-CM

## 2013-03-25 DIAGNOSIS — I259 Chronic ischemic heart disease, unspecified: Secondary | ICD-10-CM

## 2013-03-25 DIAGNOSIS — I10 Essential (primary) hypertension: Secondary | ICD-10-CM

## 2013-03-25 DIAGNOSIS — E1159 Type 2 diabetes mellitus with other circulatory complications: Secondary | ICD-10-CM

## 2013-03-25 NOTE — Assessment & Plan Note (Signed)
Her diabetes is followed by her endocrinologist Dr. Evlyn Kanner.  Her husband was Dr. Rinaldo Cloud physics teacher in college

## 2013-03-25 NOTE — Assessment & Plan Note (Signed)
The patient has not been experiencing any chest tightness.  She does not have to take sublingual nitroglycerin.. she does have known old stable coronary artery disease by previous catheter.

## 2013-03-25 NOTE — Progress Notes (Signed)
[name] Date of Birth:  1926/09/06 [facility] [address] [place], [place]  [address] [phone]         Fax   [phone]  History of Present Illness: [name] is seen back today for a six-month followup office visit. She is a former patient of Dr. Silva Bandy. She goes by the first name "[name]". She has a history of chronic pain, GERD, hypothyroidism, HTN, HLD, CKD, anxiety, OA, breast cancer and single vessel CAD per cath back in 2006. She had a nuclear stress test back in April of 2011 showing normal EF. Her perfusion imaging was abnormal but correlates to her known occluded RCA. She has been managed medically. Last echo was about 3 years ago with results as noted below. . Review of her records from [facility] show that she has had issues with pressure ulcers and general aging concerns. The patient now lives in the health care skilled nursing facility section [facility]. Her husband lives in the assisted living section. They have lunch and supper together.  The patient previously had been on Coumadin but now is on just a baby aspirin for anticoagulation. She still has frequent nosebleeds. She has painful arthritis and is on fentanyl patch  Her last echocardiogram was 08/14/12 and showed an ejection fraction of 60-65% with grade 2 diastolic dysfunction and no significant valve abnormalities.  Since last visit she has been having no new cardiac concerns.  She continues to have problems with lower extremity venous stasis ulcerations which require wrapping dressing every several days by nursing staff in the skilled nursing facility.   Current Outpatient Prescriptions  Medication Sig Dispense Refill  . Aloe-Sodium Chloride (AYR SALINE NASAL GEL NA) Place 1 application into the nose 2 (two) times daily. Both nostrils      . alum & mag hydroxide-simeth (GERI-LANTA) 200-200-20 MG/5ML suspension Take 30 mLs by mouth every 6 (six) hours as needed. For upset  stomach      . aspirin 81 MG chewable tablet Chew 81 mg by mouth every morning.       . docusate sodium (COLACE) 100 MG capsule Take 200 mg by mouth at bedtime.       . DULoxetine (CYMBALTA) 30 MG capsule Take 30 mg by mouth every morning.       . fentaNYL (DURAGESIC - DOSED MCG/HR) 100 MCG/HR Apply 1 patch every 3 hours. Change site  10 patch  0  . furosemide (LASIX) 20 MG tablet Take 60 mg by mouth daily.      . furosemide (LASIX) 40 MG tablet Take 40 mg by mouth daily.       [name] glucosamine-chondroitin 500-400 MG tablet Take 1 tablet by mouth 3 (three) times daily.        [name] guaifenesin (ROBAFEN) 100 MG/5ML syrup Take 300 mg by mouth 3 (three) times daily as needed for cough.      [name] HYDROcodone-acetaminophen (NORCO) 10-325 MG per tablet Take 0.5 tablets by mouth every 4 (four) hours as needed. For pain      . hydrOXYzine (ATARAX/VISTARIL) 10 MG tablet Take 10 mg by mouth 3 (three) times daily as needed. For itching      . hydrOXYzine (ATARAX/VISTARIL) 25 MG tablet Take 25 mg by mouth at bedtime as needed. For itching      . insulin glargine (LANTUS) 100 UNIT/ML injection Inject 24 Units into the skin at bedtime.       . isosorbide mononitrate (IMDUR) 60  MG 24 hr tablet Take 60 mg by mouth every morning.       [name] levothyroxine (SYNTHROID, LEVOTHROID) 75 MCG tablet Take 75-112.5 mcg by mouth daily. Take 1.5 tablets (112.66mcg) on Saturday. Take 1 tablet (75 mcg) daily all other days of the week      . loratadine (CLARITIN) 10 MG tablet Take 10 mg by mouth as needed for allergies.      [name] LORazepam (ATIVAN) 0.5 MG tablet Take 1 mg by mouth every 12 (twelve) hours as needed. Severe agitation      . losartan (COZAAR) 100 MG tablet Take 100 mg by mouth every morning.       . lubiprostone (AMITIZA) 24 MCG capsule Take 24 mcg by mouth 2 (two) times daily with a meal.        . Multiple Vitamins-Minerals (CERTA-VITE-LUTEIN PO) Take 1 tablet by mouth daily.       . nitroGLYCERIN (NITROSTAT) 0.4 MG SL tablet  Place 0.4 mg under the tongue every 5 (five) minutes as needed. For chest pain      . Nutritional Supplements (RESOURCE ARGINAID PO) Take 1 packet by mouth 2 (two) times daily.      [name] omeprazole (PRILOSEC) 20 MG capsule Take 20 mg by mouth 2 (two) times daily.      [name] oxymetazoline (AFRIN) 0.05 % nasal spray Place 2 sprays into the nose 2 (two) times daily as needed. For congestion      . polyethylene glycol (MIRALAX / GLYCOLAX) packet Take 17 g by mouth daily.      . predniSONE (DELTASONE) 10 MG tablet Take 10 mg by mouth every morning.       . Probiotic Product (ALIGN) 4 MG CAPS Take 1 capsule by mouth daily.      . propranolol (INDERAL) 10 MG tablet Take 10 mg by mouth 2 (two) times daily.       . sodium chloride (OCEAN) 0.65 % nasal spray Place 1 spray into the nose as needed. For dry nose      . spironolactone (ALDACTONE) 25 MG tablet Take 25 mg by mouth 2 (two) times daily.      . Vitamin D, Ergocalciferol, (DRISDOL) 50000 UNITS CAPS Take 50,000 Units by mouth every 30 (thirty) days. On the first of the month      . alum & mag hydroxide-simeth (MINTOX) 200-200-20 MG/5ML suspension Apply 15 mLs topically 2 (two) times daily. Apply to buttocks      . clotrimazole (LOTRIMIN) 1 % cream Apply 1 application topically 2 (two) times daily. To groin/buttocks      . insulin aspart (NOVOLOG) 100 UNIT/ML injection Inject 8 Units into the skin 2 (two) times daily. If CBG is greater than 100      . nystatin cream (MYCOSTATIN) Apply 1 application topically 2 (two) times daily. To rash on abdomen, back and thighs      . nystatin-triamcinolone (MYCOLOG II) cream Apply 1 application topically 2 (two) times daily. To reddened and scaly area      . sulfamethoxazole-trimethoprim (BACTRIM DS) 800-160 MG per tablet Take 1 tablet by mouth 2 (two) times daily.  42 tablet  0   No current facility-administered medications for this visit.    Allergies  Allergen Reactions  . Penicillins Other (See Comments)    Per  MAR  . Bee Venom Other (See Comments)    Per MAR  . Celebrex (Celecoxib) Other (See Comments)    Per MAR  . Lidocaine Hcl Other (  See Comments)    Per MAR  . Phenobarbital Other (See Comments)    Per MAR  . Procaine Hcl Other (See Comments)    Per mar    Patient Active Problem List   Diagnosis Date Noted  . Type II or unspecified type diabetes mellitus with peripheral circulatory disorders, uncontrolled(250.72) 02/26/2013  . Unspecified hypothyroidism 02/26/2013  . Depression 02/26/2013  . Essential tremor 02/26/2013  . Back pain 02/26/2013  . HTN (hypertension) 02/26/2013  . Unspecified constipation 02/26/2013  . Cellulitis of leg, right 01/30/2013  . Ischemic heart disease 10/02/2012  . Leg swelling 10/02/2012  . Dyspepsia 10/02/2012  . Diabetic foot ulcer 07/15/2011  . 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 Never Smoker   Smokeless tobacco  . Not on file    History  Alcohol Use No    No family history on file.  Review of Systems: Constitutional: no fever chills diaphoresis or fatigue or change in weight.  Head and neck: no hearing loss, no epistaxis, no photophobia or visual disturbance. Respiratory: No cough, shortness of breath or wheezing. Cardiovascular: No chest pain peripheral edema, palpitations. Gastrointestinal: No abdominal distention, no abdominal pain, no change in bowel habits hematochezia or melena. Genitourinary: No dysuria, no frequency, no urgency, no nocturia. Musculoskeletal:No arthralgias, no back pain, no gait disturbance or myalgias. Neurological: No dizziness, no headaches, no numbness, no seizures, no syncope, no weakness, no tremors. Hematologic: No lymphadenopathy, no easy bruising. Psychiatric: No confusion, no hallucinations, no sleep disturbance.    Physical Exam: Filed Vitals:   03/25/13 1145  BP: 128/72  Pulse: 84   the general appearance reveals alert elderly woman who walks with  a cane.  She is in no distress.The head and neck exam reveals pupils equal and reactive.  Extraocular movements are full.  There is no scleral icterus.  The mouth and pharynx are normal.  The neck is supple.  The carotids reveal no bruits.  The jugular venous pressure is normal.  The  thyroid is not enlarged.  There is no lymphadenopathy.  The chest is clear to percussion and auscultation.  There are no rales or rhonchi.  Expansion of the chest is symmetrical.  The precordium is quiet.  The first heart sound is normal.  The second heart sound is physiologically split.  There is no murmur gallop rub or click.  There is no abnormal lift or heave.  The abdomen is soft and nontender.  The bowel sounds are normal.  The liver and spleen are not enlarged.  There are no abdominal masses.  There are no abdominal bruits.  Extremities reveal bilateral lower extremity edema worse on the left.  Left leg is covered with an Ace wrap. Strength is normal and symmetrical in all extremities.  There is no lateralizing weakness.  There are no sensory deficits.  The skin is warm and dry.  There is no rash.   EKG today shows tremor artifact she is normal sinus rhythm with marked sinus arrhythmia.  Since 11/13/12, her heart rate is slower.  Assessment / Plan: The patient appears to be stable in terms of her ischemic heart disease.  We will plan to have her return in one year for followup office visit and EKG.  Continue current cardiac medication.  I encouraged her to continue to try to walk regular basis for exercise.

## 2013-03-25 NOTE — Assessment & Plan Note (Signed)
Her blood pressure was remaining stable on current medication

## 2013-03-25 NOTE — Assessment & Plan Note (Signed)
Her left leg remains very swollen and her right leg mildly swollen.  The left lower leg and calf are covered by dry sterile dressings and wrapped with an Ace wrap.

## 2013-03-25 NOTE — Patient Instructions (Addendum)
Your physician recommends that you continue on your current medications as directed. Please refer to the Current Medication list given to you today.  Your physician wants you to follow-up in: 1 year You will receive a reminder letter in the mail two months in advance. If you don't receive a letter, please call our office to schedule the follow-up appointment.   From a CARDIAC standpoint you are ok to return to assisted living

## 2013-03-29 ENCOUNTER — Non-Acute Institutional Stay (SKILLED_NURSING_FACILITY): Payer: 59 | Admitting: Nurse Practitioner

## 2013-03-29 DIAGNOSIS — E039 Hypothyroidism, unspecified: Secondary | ICD-10-CM

## 2013-03-29 DIAGNOSIS — M7989 Other specified soft tissue disorders: Secondary | ICD-10-CM

## 2013-03-29 DIAGNOSIS — E1169 Type 2 diabetes mellitus with other specified complication: Secondary | ICD-10-CM

## 2013-03-29 DIAGNOSIS — G250 Essential tremor: Secondary | ICD-10-CM

## 2013-03-29 DIAGNOSIS — L97509 Non-pressure chronic ulcer of other part of unspecified foot with unspecified severity: Secondary | ICD-10-CM

## 2013-03-29 DIAGNOSIS — F329 Major depressive disorder, single episode, unspecified: Secondary | ICD-10-CM

## 2013-03-29 DIAGNOSIS — E11621 Type 2 diabetes mellitus with foot ulcer: Secondary | ICD-10-CM

## 2013-03-29 DIAGNOSIS — I10 Essential (primary) hypertension: Secondary | ICD-10-CM

## 2013-03-29 DIAGNOSIS — M549 Dorsalgia, unspecified: Secondary | ICD-10-CM

## 2013-03-29 NOTE — Progress Notes (Signed)
Patient ID: [name], female   DOB: [birth year], 79 y.o.   MRN: [id number]  Chief Complaint:  Chief Complaint  Patient presents with  . Medical Managment of Chronic Issues    left foot diabetic ulcer     HPI:   Problem List Items Addressed This Visit   Diabetic foot ulcer - Primary     Left forefoot--wound culture, pressure reduction. Better manage blood sugar.     Leg swelling     Chronic venous insufficiency and dermatitis. Takes Furosemide 60mg  am, 40mg , Spironolactone 25mg  bid, may increase diuretics.     Unspecified hypothyroidism     Corrected with Levothyroxine 112.31mcg, last TSH 3.677 01/24/13      Depression     Stable on Cymbalta 30mg        Essential tremor     Mild head and hands tremor-not disabling. Takes Propranolol 10mg  bid      Back pain     Chronic, pain is controlled with Fentanyl 133mcg/hr and Norco 10/325 q4hr      HTN (hypertension)     Controlled on Losartan 100mg  daily         Review of Systems:  Review of Systems  Constitutional: Negative for fever, chills, weight loss, malaise/fatigue and diaphoresis.  HENT: Positive for hearing loss. Negative for congestion, sore throat, neck pain and ear discharge.   Eyes: Negative for pain, discharge and redness.  Respiratory: Positive for shortness of breath. Negative for cough, sputum production and wheezing.   Cardiovascular: Positive for chest pain, palpitations, leg swelling and PND. Negative for orthopnea and claudication.  Gastrointestinal: Negative for heartburn, nausea, vomiting, abdominal pain, diarrhea, constipation and blood in stool.  Genitourinary: Positive for frequency. Negative for dysuria, urgency and flank pain.  Musculoskeletal: Positive for myalgias, back pain, joint pain and falls.  Skin: Positive for rash (chronic BLE venous dermatitis. Left medial forefoot diabetic foot ulcer). Negative for itching.  Neurological: Positive for tremors. Negative for dizziness, tingling,  sensory change, speech change, focal weakness, seizures, loss of consciousness, weakness and headaches.  Endo/Heme/Allergies: Negative for environmental allergies and polydipsia. Does not bruise/bleed easily.  Psychiatric/Behavioral: Positive for memory loss. Negative for depression and hallucinations. The patient is nervous/anxious. The patient does not have insomnia.      Medications: Patient's Medications  New Prescriptions   No medications on file  Previous Medications   ALOE-SODIUM CHLORIDE (AYR SALINE NASAL GEL NA)    Place 1 application into the nose 2 (two) times daily. Both nostrils   ALUM & MAG HYDROXIDE-SIMETH (GERI-LANTA) 200-200-20 MG/5ML SUSPENSION    Take 30 mLs by mouth every 6 (six) hours as needed. For upset stomach   ALUM & MAG HYDROXIDE-SIMETH (MINTOX) 200-200-20 MG/5ML SUSPENSION    Apply 15 mLs topically 2 (two) times daily. Apply to buttocks   ASPIRIN 81 MG CHEWABLE TABLET    Chew 81 mg by mouth every morning.    CLOTRIMAZOLE (LOTRIMIN) 1 % CREAM    Apply 1 application topically 2 (two) times daily. To groin/buttocks   DOCUSATE SODIUM (COLACE) 100 MG CAPSULE    Take 200 mg by mouth at bedtime.    DULOXETINE (CYMBALTA) 30 MG CAPSULE    Take 30 mg by mouth every morning.    FENTANYL (DURAGESIC - DOSED MCG/HR) 100 MCG/HR    Apply 1 patch every 3 hours. Change site   FUROSEMIDE (LASIX) 20 MG TABLET    Take 60 mg by mouth daily.   FUROSEMIDE (LASIX) 40 MG TABLET  Take 40 mg by mouth daily.    GLUCOSAMINE-CHONDROITIN 500-400 MG TABLET    Take 1 tablet by mouth 3 (three) times daily.     GUAIFENESIN (ROBAFEN) 100 MG/5ML SYRUP    Take 300 mg by mouth 3 (three) times daily as needed for cough.   HYDROCODONE-ACETAMINOPHEN (NORCO) 10-325 MG PER TABLET    Take 0.5 tablets by mouth every 4 (four) hours as needed. For pain   HYDROXYZINE (ATARAX/VISTARIL) 10 MG TABLET    Take 10 mg by mouth 3 (three) times daily as needed. For itching   HYDROXYZINE (ATARAX/VISTARIL) 25 MG TABLET     Take 25 mg by mouth at bedtime as needed. For itching   INSULIN ASPART (NOVOLOG) 100 UNIT/ML INJECTION    Inject 8 Units into the skin 2 (two) times daily. If CBG is greater than 100   INSULIN GLARGINE (LANTUS) 100 UNIT/ML INJECTION    Inject 24 Units into the skin at bedtime.    ISOSORBIDE MONONITRATE (IMDUR) 60 MG 24 HR TABLET    Take 60 mg by mouth every morning.    LEVOTHYROXINE (SYNTHROID, LEVOTHROID) 75 MCG TABLET    Take 75-112.5 mcg by mouth daily. Take 1.5 tablets (112.41mcg) on Saturday. Take 1 tablet (75 mcg) daily all other days of the week   LORATADINE (CLARITIN) 10 MG TABLET    Take 10 mg by mouth as needed for allergies.   LORAZEPAM (ATIVAN) 0.5 MG TABLET    Take 1 mg by mouth every 12 (twelve) hours as needed. Severe agitation   LOSARTAN (COZAAR) 100 MG TABLET    Take 100 mg by mouth every morning.    LUBIPROSTONE (AMITIZA) 24 MCG CAPSULE    Take 24 mcg by mouth 2 (two) times daily with a meal.     MULTIPLE VITAMINS-MINERALS (CERTA-VITE-LUTEIN PO)    Take 1 tablet by mouth daily.    NITROGLYCERIN (NITROSTAT) 0.4 MG SL TABLET    Place 0.4 mg under the tongue every 5 (five) minutes as needed. For chest pain   NUTRITIONAL SUPPLEMENTS (RESOURCE ARGINAID PO)    Take 1 packet by mouth 2 (two) times daily.   NYSTATIN CREAM (MYCOSTATIN)    Apply 1 application topically 2 (two) times daily. To rash on abdomen, back and thighs   NYSTATIN-TRIAMCINOLONE (MYCOLOG II) CREAM    Apply 1 application topically 2 (two) times daily. To reddened and scaly area   OMEPRAZOLE (PRILOSEC) 20 MG CAPSULE    Take 20 mg by mouth 2 (two) times daily.   OXYMETAZOLINE (AFRIN) 0.05 % NASAL SPRAY    Place 2 sprays into the nose 2 (two) times daily as needed. For congestion   POLYETHYLENE GLYCOL (MIRALAX / GLYCOLAX) PACKET    Take 17 g by mouth daily.   PREDNISONE (DELTASONE) 10 MG TABLET    Take 10 mg by mouth every morning.    PROBIOTIC PRODUCT (ALIGN) 4 MG CAPS    Take 1 capsule by mouth daily.   PROPRANOLOL  (INDERAL) 10 MG TABLET    Take 10 mg by mouth 2 (two) times daily.    SODIUM CHLORIDE (OCEAN) 0.65 % NASAL SPRAY    Place 1 spray into the nose as needed. For dry nose   SPIRONOLACTONE (ALDACTONE) 25 MG TABLET    Take 25 mg by mouth 2 (two) times daily.   SULFAMETHOXAZOLE-TRIMETHOPRIM (BACTRIM DS) 800-160 MG PER TABLET    Take 1 tablet by mouth 2 (two) times daily.   VITAMIN D, ERGOCALCIFEROL, (DRISDOL) 50000 UNITS  CAPS    Take 50,000 Units by mouth every 30 (thirty) days. On the first of the month  Modified Medications   No medications on file  Discontinued Medications   No medications on file     Physical Exam: Physical Exam  Constitutional: She appears well-developed.  HENT:  Head: Normocephalic and atraumatic.  Left Ear: External ear normal.  Eyes: Conjunctivae and EOM are normal. Pupils are equal, round, and reactive to light.  Neck: Neck supple. No JVD present. No thyromegaly present.  Cardiovascular: Normal rate and regular rhythm.   Murmur heard. Pulmonary/Chest: Effort normal.  Abdominal: Soft. There is no tenderness. There is no rebound.  Musculoskeletal: She exhibits edema.  Lymphadenopathy:    She has no cervical adenopathy.  Neurological: She is alert. No cranial nerve deficit. Coordination normal.  Skin: Skin is warm and dry. Rash noted.     Swelling, fever, warmth, tenderness, scaly, weeping  Psychiatric: Her mood appears anxious. Her affect is not blunt, not labile and not inappropriate. Her speech is rapid and/or pressured (occasionally ). Her speech is not delayed and not slurred. She is hyperactive. She is not agitated, not aggressive, not slowed, not withdrawn, not actively hallucinating and not combative. Thought content is not paranoid and not delusional. She does not exhibit a depressed mood. She exhibits abnormal recent memory.     Filed Vitals:   03/29/13 1447  BP: 124/58  Pulse: 72  Temp: 97.7 F (36.5 C)  TempSrc: Tympanic  Resp: 16      Labs  reviewed: Basic Metabolic Panel:  Recent Labs  08/65/78 2030 11/13/12 1852 01/24/13  NA 141 139 139  K 3.8 4.5 4.6  CL 101 98  --   CO2 33* 29  --   GLUCOSE 129* 255*  --   BUN 41* 47* 41*  CREATININE 0.99 0.94 1.1  CALCIUM 9.3 9.8  --   TSH  --  1.511 3.68    Liver Function Tests:  Recent Labs  11/13/12 1852  AST 21  ALT 19  ALKPHOS 74  BILITOT 0.4  PROT 7.5  ALBUMIN 3.6    CBC:  Recent Labs  05/03/12 2030 11/13/12 1852 12/20/12  WBC 8.1 7.8 8.3  NEUTROABS 5.8 5.8  --   HGB 12.8 13.6 14.3  HCT 38.6 42.4 42  MCV 98.0 98.1  --   PLT 227 196 184    Anemia Panel: No results found for this basename: IRON, FOLATE, VITAMINB12,  in the last 8760 hours  Significant Diagnostic Results:     Assessment/Plan Diabetic foot ulcer Left forefoot--wound culture, pressure reduction. Better manage blood sugar.   Leg swelling Chronic venous insufficiency and dermatitis. Takes Furosemide 60mg  am, 40mg , Spironolactone 25mg  bid, may increase diuretics.   Unspecified hypothyroidism Corrected with Levothyroxine 112.74mcg, last TSH 3.677 01/24/13    HTN (hypertension) Controlled on Losartan 100mg  daily    Depression Stable on Cymbalta 30mg      Essential tremor Mild head and hands tremor-not disabling. Takes Propranolol 10mg  bid    Back pain Chronic, pain is controlled with Fentanyl 158mcg/hr and Norco 10/325 q4hr        Family/ staff Communication: wound care left forefoot   Goals of care: SNF   Labs/tests ordered CBC, BMP

## 2013-03-29 NOTE — Assessment & Plan Note (Signed)
Stable on Cymbalta 30mg  

## 2013-03-29 NOTE — Assessment & Plan Note (Signed)
Chronic venous insufficiency and dermatitis. Takes Furosemide 60mg  am, 40mg , Spironolactone 25mg  bid, may increase diuretics.

## 2013-03-29 NOTE — Assessment & Plan Note (Signed)
Controlled on Losartan 100 mg daily.

## 2013-03-29 NOTE — Assessment & Plan Note (Signed)
Corrected with Levothyroxine 112.5mcg, last TSH 3.677 01/24/13 

## 2013-03-29 NOTE — Assessment & Plan Note (Signed)
Left forefoot--wound culture, pressure reduction. Better manage blood sugar.

## 2013-03-29 NOTE — Assessment & Plan Note (Signed)
Mild head and hands tremor-not disabling. Takes Propranolol 10mg bid                                              

## 2013-03-29 NOTE — Assessment & Plan Note (Signed)
Chronic, pain is controlled with Fentanyl 100mcg/hr and Norco 10/325 q4hr                                         

## 2013-04-01 LAB — CBC AND DIFFERENTIAL
Platelets: 220 10*3/uL (ref 150–399)
WBC: 7.2 10^3/mL
WBC: 7.2 10^3/mL

## 2013-04-01 LAB — BASIC METABOLIC PANEL
BUN: 40 mg/dL — AB (ref 4–21)
Creatinine: 1 mg/dL (ref 0.5–1.1)
Potassium: 3.9 mmol/L (ref 3.4–5.3)

## 2013-04-02 ENCOUNTER — Non-Acute Institutional Stay (SKILLED_NURSING_FACILITY): Payer: Medicare Other | Admitting: Nurse Practitioner

## 2013-04-02 DIAGNOSIS — E1159 Type 2 diabetes mellitus with other circulatory complications: Secondary | ICD-10-CM

## 2013-04-02 DIAGNOSIS — M7989 Other specified soft tissue disorders: Secondary | ICD-10-CM

## 2013-04-02 DIAGNOSIS — M869 Osteomyelitis, unspecified: Secondary | ICD-10-CM

## 2013-04-02 DIAGNOSIS — I10 Essential (primary) hypertension: Secondary | ICD-10-CM

## 2013-04-02 DIAGNOSIS — L03116 Cellulitis of left lower limb: Secondary | ICD-10-CM | POA: Insufficient documentation

## 2013-04-02 DIAGNOSIS — L02619 Cutaneous abscess of unspecified foot: Secondary | ICD-10-CM

## 2013-04-02 DIAGNOSIS — A4902 Methicillin resistant Staphylococcus aureus infection, unspecified site: Secondary | ICD-10-CM

## 2013-04-02 DIAGNOSIS — L03119 Cellulitis of unspecified part of limb: Secondary | ICD-10-CM

## 2013-04-02 DIAGNOSIS — B9562 Methicillin resistant Staphylococcus aureus infection as the cause of diseases classified elsewhere: Secondary | ICD-10-CM

## 2013-04-02 NOTE — Assessment & Plan Note (Signed)
Controlled on Losartan 100 mg daily.

## 2013-04-02 NOTE — Assessment & Plan Note (Signed)
Medial left forefoot diabetic foot ulcer and left 3rd toe open wound: both culture showed MRSA and susceptible to Septra DS--will treat for total 8 weeks.

## 2013-04-02 NOTE — Assessment & Plan Note (Signed)
Not controlled, Hgb A1c 9.8 01/24/13, CBG am 78-230, noon 152-351, pm 149-263, continue Lantus 24units daily, change Novolog to 8 units with breakfast/dinner, 12 units with lunch for CBG>100 

## 2013-04-02 NOTE — Assessment & Plan Note (Signed)
Wound culture left 3nd toe, MRSA susceptible to Septra DS--will treat with Septra DS bid for 8 weeks. X-ray left toes 03/29/13 complete dislocation of the interphalangeal joint of the left middle toe with displacement of the distal phalanx medially and associated with a few small bone densities involving the distal portion proximal phalanx most consistent with chip fractures--the findings may be secondary to osteomyelitis

## 2013-04-02 NOTE — Progress Notes (Signed)
Patient ID: [name], female   DOB: 09/09/1926, 79 y.o.   MRN: [id number]  Chief Complaint:  Chief Complaint  Patient presents with  . Medical Managment of Chronic Issues    osteomyelitis     HPI:   Problem List Items Addressed This Visit   Toe osteomyelitis, left - Primary (Chronic)     Wound culture left 3nd toe, MRSA susceptible to Septra DS--will treat with Septra DS bid for 8 weeks. X-ray left toes 03/29/13 complete dislocation of the interphalangeal joint of the left middle toe with displacement of the distal phalanx medially and associated with a few small bone densities involving the distal portion proximal phalanx most consistent with chip fractures--the findings may be secondary to osteomyelitis     Leg swelling     Chronic venous insufficiency and dermatitis. Will increase  Furosemide 60mg  bid and continue with  Spironolactone 25mg  bid, f/u BMP in one week.       Type II or unspecified type diabetes mellitus with peripheral circulatory disorders, uncontrolled(250.72)     Not controlled, Hgb A1c 9.8 01/24/13, CBG am 78-230, noon 152-351, pm 149-263, continue Lantus 24units daily, change Novolog to 8 units with breakfast/dinner, 12 units with lunch for CBG>100      HTN (hypertension)     Controlled on Losartan 100mg  daily        MRSA cellulitis of left foot     Medial left forefoot diabetic foot ulcer and left 3rd toe open wound: both culture showed MRSA and susceptible to Septra DS--will treat for total 8 weeks.       Review of Systems:  Review of Systems  Constitutional: Negative for fever, chills, weight loss, malaise/fatigue and diaphoresis.  HENT: Positive for hearing loss. Negative for congestion, sore throat, neck pain and ear discharge.   Eyes: Negative for pain, discharge and redness.  Respiratory: Positive for shortness of breath. Negative for cough, sputum production and wheezing.   Cardiovascular: Positive for chest pain, palpitations, leg swelling and  PND. Negative for orthopnea and claudication.  Gastrointestinal: Negative for heartburn, nausea, vomiting, abdominal pain, diarrhea, constipation and blood in stool.  Genitourinary: Positive for frequency. Negative for dysuria, urgency and flank pain.  Musculoskeletal: Positive for myalgias, back pain, joint pain and falls.  Skin: Positive for rash (chronic BLE venous dermatitis. Left medial forefoot diabetic foot ulcer). Negative for itching.  Neurological: Positive for tremors. Negative for dizziness, tingling, sensory change, speech change, focal weakness, seizures, loss of consciousness, weakness and headaches.  Endo/Heme/Allergies: Negative for environmental allergies and polydipsia. Does not bruise/bleed easily.  Psychiatric/Behavioral: Positive for memory loss. Negative for depression and hallucinations. The patient is nervous/anxious. The patient does not have insomnia.      Medications: Patient's Medications  New Prescriptions   No medications on file  Previous Medications   ALOE-SODIUM CHLORIDE (AYR SALINE NASAL GEL NA)    Place 1 application into the nose 2 (two) times daily. Both nostrils   ALUM & MAG HYDROXIDE-SIMETH (GERI-LANTA) 200-200-20 MG/5ML SUSPENSION    Take 30 mLs by mouth every 6 (six) hours as needed. For upset stomach   ALUM & MAG HYDROXIDE-SIMETH (MINTOX) 200-200-20 MG/5ML SUSPENSION    Apply 15 mLs topically 2 (two) times daily. Apply to buttocks   ASPIRIN 81 MG CHEWABLE TABLET    Chew 81 mg by mouth every morning.    CLOTRIMAZOLE (LOTRIMIN) 1 % CREAM    Apply 1 application topically 2 (two) times daily. To groin/buttocks   DOCUSATE SODIUM (COLACE) 100  MG CAPSULE    Take 200 mg by mouth at bedtime.    DULOXETINE (CYMBALTA) 30 MG CAPSULE    Take 30 mg by mouth every morning.    FENTANYL (DURAGESIC - DOSED MCG/HR) 100 MCG/HR    Apply 1 patch every 3 hours. Change site   FUROSEMIDE (LASIX) 20 MG TABLET    Take 60 mg by mouth daily.   FUROSEMIDE (LASIX) 40 MG TABLET     Take 40 mg by mouth daily.    GLUCOSAMINE-CHONDROITIN 500-400 MG TABLET    Take 1 tablet by mouth 3 (three) times daily.     GUAIFENESIN (ROBAFEN) 100 MG/5ML SYRUP    Take 300 mg by mouth 3 (three) times daily as needed for cough.   HYDROCODONE-ACETAMINOPHEN (NORCO) 10-325 MG PER TABLET    Take 0.5 tablets by mouth every 4 (four) hours as needed. For pain   HYDROXYZINE (ATARAX/VISTARIL) 10 MG TABLET    Take 10 mg by mouth 3 (three) times daily as needed. For itching   HYDROXYZINE (ATARAX/VISTARIL) 25 MG TABLET    Take 25 mg by mouth at bedtime as needed. For itching   INSULIN ASPART (NOVOLOG) 100 UNIT/ML INJECTION    Inject 8 Units into the skin 2 (two) times daily. If CBG is greater than 100   INSULIN GLARGINE (LANTUS) 100 UNIT/ML INJECTION    Inject 24 Units into the skin at bedtime.    ISOSORBIDE MONONITRATE (IMDUR) 60 MG 24 HR TABLET    Take 60 mg by mouth every morning.    LEVOTHYROXINE (SYNTHROID, LEVOTHROID) 75 MCG TABLET    Take 75-112.5 mcg by mouth daily. Take 1.5 tablets (112.87mcg) on Saturday. Take 1 tablet (75 mcg) daily all other days of the week   LORATADINE (CLARITIN) 10 MG TABLET    Take 10 mg by mouth as needed for allergies.   LORAZEPAM (ATIVAN) 0.5 MG TABLET    Take 1 mg by mouth every 12 (twelve) hours as needed. Severe agitation   LOSARTAN (COZAAR) 100 MG TABLET    Take 100 mg by mouth every morning.    LUBIPROSTONE (AMITIZA) 24 MCG CAPSULE    Take 24 mcg by mouth 2 (two) times daily with a meal.     MULTIPLE VITAMINS-MINERALS (CERTA-VITE-LUTEIN PO)    Take 1 tablet by mouth daily.    NITROGLYCERIN (NITROSTAT) 0.4 MG SL TABLET    Place 0.4 mg under the tongue every 5 (five) minutes as needed. For chest pain   NUTRITIONAL SUPPLEMENTS (RESOURCE ARGINAID PO)    Take 1 packet by mouth 2 (two) times daily.   NYSTATIN CREAM (MYCOSTATIN)    Apply 1 application topically 2 (two) times daily. To rash on abdomen, back and thighs   NYSTATIN-TRIAMCINOLONE (MYCOLOG II) CREAM    Apply 1  application topically 2 (two) times daily. To reddened and scaly area   OMEPRAZOLE (PRILOSEC) 20 MG CAPSULE    Take 20 mg by mouth 2 (two) times daily.   OXYMETAZOLINE (AFRIN) 0.05 % NASAL SPRAY    Place 2 sprays into the nose 2 (two) times daily as needed. For congestion   POLYETHYLENE GLYCOL (MIRALAX / GLYCOLAX) PACKET    Take 17 g by mouth daily.   PREDNISONE (DELTASONE) 10 MG TABLET    Take 10 mg by mouth every morning.    PROBIOTIC PRODUCT (ALIGN) 4 MG CAPS    Take 1 capsule by mouth daily.   PROPRANOLOL (INDERAL) 10 MG TABLET    Take 10 mg by  mouth 2 (two) times daily.    SODIUM CHLORIDE (OCEAN) 0.65 % NASAL SPRAY    Place 1 spray into the nose as needed. For dry nose   SPIRONOLACTONE (ALDACTONE) 25 MG TABLET    Take 25 mg by mouth 2 (two) times daily.   SULFAMETHOXAZOLE-TRIMETHOPRIM (BACTRIM DS) 800-160 MG PER TABLET    Take 1 tablet by mouth 2 (two) times daily.   VITAMIN D, ERGOCALCIFEROL, (DRISDOL) 50000 UNITS CAPS    Take 50,000 Units by mouth every 30 (thirty) days. On the first of the month  Modified Medications   No medications on file  Discontinued Medications   No medications on file     Physical Exam: Physical Exam  Constitutional: She appears well-developed.  HENT:  Head: Normocephalic and atraumatic.  Left Ear: External ear normal.  Eyes: Conjunctivae and EOM are normal. Pupils are equal, round, and reactive to light.  Neck: Neck supple. No JVD present. No thyromegaly present.  Cardiovascular: Normal rate and regular rhythm.   Murmur heard. Pulmonary/Chest: Effort normal.  Abdominal: Soft. There is no tenderness. There is no rebound.  Musculoskeletal: She exhibits edema.  Lymphadenopathy:    She has no cervical adenopathy.  Neurological: She is alert. No cranial nerve deficit. Coordination normal.  Skin: Skin is warm and dry. Rash noted.     Swelling, fever, warmth, tenderness, scaly, weeping  Psychiatric: Her mood appears anxious. Her affect is not blunt, not  labile and not inappropriate. Her speech is rapid and/or pressured (occasionally ). Her speech is not delayed and not slurred. She is hyperactive. She is not agitated, not aggressive, not slowed, not withdrawn, not actively hallucinating and not combative. Thought content is not paranoid and not delusional. She does not exhibit a depressed mood. She exhibits abnormal recent memory.     Filed Vitals:   04/02/13 1134  BP: 138/66  Pulse: 68  Temp: 97.7 F (36.5 C)  TempSrc: Tympanic  Resp: 18      Labs reviewed:  Wound culture  04/02/13 arch of left foot-MRSA treated with Septra DS    Left 3rd digit foot-MRSA treated with Septra DS Basic Metabolic Panel:  Recent Labs  16/10/96 2030 11/13/12 1852 01/24/13 04/01/13  NA 141 139 139 137  K 3.8 4.5 4.6 3.9  CL 101 98  --   --   CO2 33* 29  --   --   GLUCOSE 129* 255*  --   --   BUN 41* 47* 41* 40*  CREATININE 0.99 0.94 1.1 1.0  CALCIUM 9.3 9.8  --   --   TSH  --  1.511 3.68  --     Liver Function Tests:  Recent Labs  11/13/12 1852  AST 21  ALT 19  ALKPHOS 74  BILITOT 0.4  PROT 7.5  ALBUMIN 3.6    CBC:  Recent Labs  05/03/12 2030 11/13/12 1852 12/20/12 04/01/13  WBC 8.1 7.8 8.3 7.2  NEUTROABS 5.8 5.8  --   --   HGB 12.8 13.6 14.3 14.7  HCT 38.6 42.4 42 43  MCV 98.0 98.1  --   --   PLT 227 196 184 220    Anemia Panel: No results found for this basename: IRON, FOLATE, VITAMINB12,  in the last 8760 hours  Significant Diagnostic Results:   03/29/13 X-ray left toes: 1. Complete dislocation of the interphalangeal joint of the left middle toe with displacement of the distal phalanx medially and associated with a few small bone densities involving the  distal portion proximal phalanx most consistent with chip fractures. The findings may be secondary to osteomyelitis. 2. Bone irregularity and small chip fractures involve the tuft of the distal phalanx left second toe. 3. 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   Assessment/Plan Toe osteomyelitis, left Wound culture left 3nd toe, MRSA susceptible to Septra DS--will treat with Septra DS bid for 8 weeks. X-ray left toes 03/29/13 complete dislocation of the interphalangeal joint of the left middle toe with displacement of the distal phalanx medially and associated with a few small bone densities involving the distal portion proximal phalanx most consistent with chip fractures--the findings may be secondary to osteomyelitis   MRSA cellulitis of left foot Medial left forefoot diabetic foot ulcer and left 3rd toe open wound: both culture showed MRSA and susceptible to Septra DS--will treat for total 8 weeks.  HTN (hypertension) Controlled on Losartan 100mg  daily      Type II or unspecified type diabetes mellitus with peripheral circulatory disorders, uncontrolled(250.72) Not controlled, Hgb A1c 9.8 01/24/13, CBG am 78-230, noon 152-351, pm 149-263, continue Lantus 24units daily, change Novolog to 8 units with breakfast/dinner, 12 units with lunch for CBG>100    Leg swelling Chronic venous insufficiency and dermatitis. Will increase  Furosemide 60mg  bid and continue with  Spironolactone 25mg  bid, f/u BMP in one week.         Family/ staff Communication: contact precaution  Goals of care: SNF   Labs/tests ordered BMP in one week.

## 2013-04-02 NOTE — Assessment & Plan Note (Signed)
Chronic venous insufficiency and dermatitis. Will increase  Furosemide 60mg  bid and continue with  Spironolactone 25mg  bid, f/u BMP in one week.

## 2013-04-09 LAB — BASIC METABOLIC PANEL: Sodium: 136 mmol/L — AB (ref 137–147)

## 2013-04-23 ENCOUNTER — Non-Acute Institutional Stay (SKILLED_NURSING_FACILITY): Payer: Medicare Other | Admitting: Nurse Practitioner

## 2013-04-23 DIAGNOSIS — M549 Dorsalgia, unspecified: Secondary | ICD-10-CM

## 2013-04-23 DIAGNOSIS — R296 Repeated falls: Secondary | ICD-10-CM

## 2013-04-23 DIAGNOSIS — E039 Hypothyroidism, unspecified: Secondary | ICD-10-CM

## 2013-04-23 DIAGNOSIS — F329 Major depressive disorder, single episode, unspecified: Secondary | ICD-10-CM

## 2013-04-23 DIAGNOSIS — G250 Essential tremor: Secondary | ICD-10-CM

## 2013-04-23 DIAGNOSIS — I10 Essential (primary) hypertension: Secondary | ICD-10-CM

## 2013-04-23 DIAGNOSIS — G252 Other specified forms of tremor: Secondary | ICD-10-CM

## 2013-04-23 DIAGNOSIS — E1159 Type 2 diabetes mellitus with other circulatory complications: Secondary | ICD-10-CM

## 2013-04-23 DIAGNOSIS — M7989 Other specified soft tissue disorders: Secondary | ICD-10-CM

## 2013-04-23 DIAGNOSIS — Z9181 History of falling: Secondary | ICD-10-CM

## 2013-04-23 DIAGNOSIS — Z66 Do not resuscitate: Secondary | ICD-10-CM | POA: Insufficient documentation

## 2013-04-23 NOTE — Assessment & Plan Note (Signed)
Corrected with Levothyroxine 112.5mcg, last TSH 3.677 01/24/13 

## 2013-04-23 NOTE — Assessment & Plan Note (Signed)
Chronic, pain is controlled with Fentanyl 100mcg/hr and Norco 10/325 q4hr                                         

## 2013-04-23 NOTE — Progress Notes (Signed)
Patient ID: [name], female   DOB: [birth year], 79 y.o.   MRN: [id number]  Chief Complaint:  Chief Complaint  Patient presents with  . Medical Managment of Chronic Issues    hypoglycemic episodes, falls.      HPI:    Problem List Items Addressed This Visit   Type II or unspecified type diabetes mellitus with peripheral circulatory disorders, uncontrolled(250.72) - Primary (Chronic)     Not controlled, Hgb A1c 9.8 01/24/13, CBG am 78-230, noon 152-351, pm 149-263, continue Lantus 24units daily, change Novolog to 8 units with breakfast/dinner, 12 units with lunch for CBG>100. CBG 50s--OJ 120cc--CBG 75 x1 04/21/13. Otherwise fasting CBG am 108-260, noon 147-290, pm 101-303. Continue to monitor CBGs as well as the patient.         Leg swelling     Chronic venous insufficiency and dermatitis. Will increase  Furosemide 60mg  bid and continue with  Spironolactone 25mg  bid, Bun/creat 44/1.28 04/09/13        Unspecified hypothyroidism     Corrected with Levothyroxine 112.47mcg, last TSH 3.677 01/24/13        Depression     Stable on Cymbalta 30mg          Essential tremor     Mild head and hands tremor-not disabling. Takes Propranolol 10mg  bid        Back pain     Chronic, pain is controlled with Fentanyl 166mcg/hr and Norco 10/325 q4hr        HTN (hypertension)     Controlled on Losartan 100mg  daily          Falls frequently     Denied chest pain, palpitation, or change of LOC. Stated she fall because of her legs became weak. Sustained a large bruise in her lower back--pain is more than her usual lower back pain but is still tolerable with her daily routine activities.     DNR (do not resuscitate)      Review of Systems:  Review of Systems  Constitutional: Negative for fever, chills, weight loss, malaise/fatigue and diaphoresis.  HENT: Positive for hearing loss. Negative for congestion, sore throat, neck pain and ear discharge.   Eyes: Negative for pain,  discharge and redness.  Respiratory: Positive for shortness of breath. Negative for cough, sputum production and wheezing.   Cardiovascular: Positive for chest pain, palpitations, leg swelling and PND. Negative for orthopnea and claudication.  Gastrointestinal: Negative for heartburn, nausea, vomiting, abdominal pain, diarrhea, constipation and blood in stool.  Genitourinary: Positive for frequency. Negative for dysuria, urgency and flank pain.  Musculoskeletal: Positive for myalgias, back pain, joint pain and falls.  Skin: Positive for rash (chronic BLE venous dermatitis. Left medial forefoot diabetic foot ulcer). Negative for itching.       A new large bruise at lower back sustained from her fall.   Neurological: Positive for tremors. Negative for dizziness, tingling, sensory change, speech change, focal weakness, seizures, loss of consciousness, weakness and headaches.  Endo/Heme/Allergies: Negative for environmental allergies and polydipsia. Does not bruise/bleed easily.  Psychiatric/Behavioral: Positive for memory loss. Negative for depression and hallucinations. The patient is nervous/anxious. The patient does not have insomnia.      Medications: Patient's Medications  New Prescriptions   No medications on file  Previous Medications   ALOE-SODIUM CHLORIDE (AYR SALINE NASAL GEL NA)    Place 1 application into the nose 2 (two) times daily. Both nostrils   ALUM & MAG HYDROXIDE-SIMETH (GERI-LANTA) 200-200-20 MG/5ML SUSPENSION    Take 30 mLs  by mouth every 6 (six) hours as needed. For upset stomach   ALUM & MAG HYDROXIDE-SIMETH (MINTOX) 200-200-20 MG/5ML SUSPENSION    Apply 15 mLs topically 2 (two) times daily. Apply to buttocks   ASPIRIN 81 MG CHEWABLE TABLET    Chew 81 mg by mouth every morning.    CLOTRIMAZOLE (LOTRIMIN) 1 % CREAM    Apply 1 application topically 2 (two) times daily. To groin/buttocks   DOCUSATE SODIUM (COLACE) 100 MG CAPSULE    Take 200 mg by mouth at bedtime.     DULOXETINE (CYMBALTA) 30 MG CAPSULE    Take 30 mg by mouth every morning.    FENTANYL (DURAGESIC - DOSED MCG/HR) 100 MCG/HR    Apply 1 patch every 3 hours. Change site   FUROSEMIDE (LASIX) 20 MG TABLET    Take 60 mg by mouth daily.   FUROSEMIDE (LASIX) 40 MG TABLET    Take 40 mg by mouth daily.    GLUCOSAMINE-CHONDROITIN 500-400 MG TABLET    Take 1 tablet by mouth 3 (three) times daily.     GUAIFENESIN (ROBAFEN) 100 MG/5ML SYRUP    Take 300 mg by mouth 3 (three) times daily as needed for cough.   HYDROCODONE-ACETAMINOPHEN (NORCO) 10-325 MG PER TABLET    Take 0.5 tablets by mouth every 4 (four) hours as needed. For pain   HYDROXYZINE (ATARAX/VISTARIL) 10 MG TABLET    Take 10 mg by mouth 3 (three) times daily as needed. For itching   HYDROXYZINE (ATARAX/VISTARIL) 25 MG TABLET    Take 25 mg by mouth at bedtime as needed. For itching   INSULIN ASPART (NOVOLOG) 100 UNIT/ML INJECTION    Inject 8 Units into the skin 2 (two) times daily. If CBG is greater than 100   INSULIN GLARGINE (LANTUS) 100 UNIT/ML INJECTION    Inject 24 Units into the skin at bedtime.    ISOSORBIDE MONONITRATE (IMDUR) 60 MG 24 HR TABLET    Take 60 mg by mouth every morning.    LEVOTHYROXINE (SYNTHROID, LEVOTHROID) 75 MCG TABLET    Take 75-112.5 mcg by mouth daily. Take 1.5 tablets (112.45mcg) on Saturday. Take 1 tablet (75 mcg) daily all other days of the week   LORATADINE (CLARITIN) 10 MG TABLET    Take 10 mg by mouth as needed for allergies.   LORAZEPAM (ATIVAN) 0.5 MG TABLET    Take 1 mg by mouth every 12 (twelve) hours as needed. Severe agitation   LOSARTAN (COZAAR) 100 MG TABLET    Take 100 mg by mouth every morning.    LUBIPROSTONE (AMITIZA) 24 MCG CAPSULE    Take 24 mcg by mouth 2 (two) times daily with a meal.     MULTIPLE VITAMINS-MINERALS (CERTA-VITE-LUTEIN PO)    Take 1 tablet by mouth daily.    NITROGLYCERIN (NITROSTAT) 0.4 MG SL TABLET    Place 0.4 mg under the tongue every 5 (five) minutes as needed. For chest pain    NUTRITIONAL SUPPLEMENTS (RESOURCE ARGINAID PO)    Take 1 packet by mouth 2 (two) times daily.   NYSTATIN CREAM (MYCOSTATIN)    Apply 1 application topically 2 (two) times daily. To rash on abdomen, back and thighs   NYSTATIN-TRIAMCINOLONE (MYCOLOG II) CREAM    Apply 1 application topically 2 (two) times daily. To reddened and scaly area   OMEPRAZOLE (PRILOSEC) 20 MG CAPSULE    Take 20 mg by mouth 2 (two) times daily.   OXYMETAZOLINE (AFRIN) 0.05 % NASAL SPRAY    Place  2 sprays into the nose 2 (two) times daily as needed. For congestion   POLYETHYLENE GLYCOL (MIRALAX / GLYCOLAX) PACKET    Take 17 g by mouth daily.   PREDNISONE (DELTASONE) 10 MG TABLET    Take 10 mg by mouth every morning.    PROBIOTIC PRODUCT (ALIGN) 4 MG CAPS    Take 1 capsule by mouth daily.   PROPRANOLOL (INDERAL) 10 MG TABLET    Take 10 mg by mouth 2 (two) times daily.    SODIUM CHLORIDE (OCEAN) 0.65 % NASAL SPRAY    Place 1 spray into the nose as needed. For dry nose   SPIRONOLACTONE (ALDACTONE) 25 MG TABLET    Take 25 mg by mouth 2 (two) times daily.   SULFAMETHOXAZOLE-TRIMETHOPRIM (BACTRIM DS) 800-160 MG PER TABLET    Take 1 tablet by mouth 2 (two) times daily.   VITAMIN D, ERGOCALCIFEROL, (DRISDOL) 50000 UNITS CAPS    Take 50,000 Units by mouth every 30 (thirty) days. On the first of the month  Modified Medications   No medications on file  Discontinued Medications   No medications on file     Physical Exam: Physical Exam  Constitutional: She appears well-developed.  HENT:  Head: Normocephalic and atraumatic.  Left Ear: External ear normal.  Eyes: Conjunctivae and EOM are normal. Pupils are equal, round, and reactive to light.  Neck: Neck supple. No JVD present. No thyromegaly present.  Cardiovascular: Normal rate and regular rhythm.   Murmur heard. Pulmonary/Chest: Effort normal.  Abdominal: Soft. There is no tenderness. There is no rebound.  Musculoskeletal: She exhibits edema.  Lymphadenopathy:    She has  no cervical adenopathy.  Neurological: She is alert. No cranial nerve deficit. Coordination normal.  Skin: Skin is warm and dry. Rash noted.     Swelling, fever, warmth, tenderness, scaly, weeping-slightly better in BLE. A new large bruise in the middle of the lower back--observe.   Psychiatric: Her mood appears anxious. Her affect is not blunt, not labile and not inappropriate. Her speech is rapid and/or pressured (occasionally ). Her speech is not delayed and not slurred. She is hyperactive. She is not agitated, not aggressive, not slowed, not withdrawn, not actively hallucinating and not combative. Thought content is not paranoid and not delusional. She does not exhibit a depressed mood. She exhibits abnormal recent memory.     Filed Vitals:   04/23/13 1129  BP: 145/82  Pulse: 88  Resp: 20  SpO2: 95%      Labs reviewed: Basic Metabolic Panel:  Recent Labs  91/47/82 2030 11/13/12 1852 01/24/13 04/01/13 04/09/13  NA 141 139 139 137 136*  K 3.8 4.5 4.6 3.9 4.8  CL 101 98  --   --   --   CO2 33* 29  --   --   --   GLUCOSE 129* 255*  --   --   --   BUN 41* 47* 41* 40* 44*  CREATININE 0.99 0.94 1.1 1.0 1.3*  CALCIUM 9.3 9.8  --   --   --   TSH  --  1.511 3.68  --   --     Liver Function Tests:  Recent Labs  11/13/12 1852  AST 21  ALT 19  ALKPHOS 74  BILITOT 0.4  PROT 7.5  ALBUMIN 3.6    CBC:  Recent Labs  05/03/12 2030 11/13/12 1852 12/20/12 04/01/13  WBC 8.1 7.8 8.3 7.2  NEUTROABS 5.8 5.8  --   --   HGB 12.8  13.6 14.3 14.7  HCT 38.6 42.4 42 43  MCV 98.0 98.1  --   --   PLT 227 196 184 220    Anemia Panel: No results found for this basename: IRON, FOLATE, VITAMINB12,  in the last 8760 hours  Significant Diagnostic Results:     Assessment/Plan Type II or unspecified type diabetes mellitus with peripheral circulatory disorders, uncontrolled(250.72) Not controlled, Hgb A1c 9.8 01/24/13, CBG am 78-230, noon 152-351, pm 149-263, continue Lantus  24units daily, change Novolog to 8 units with breakfast/dinner, 12 units with lunch for CBG>100. CBG 50s--OJ 120cc--CBG 75 x1 04/21/13. Otherwise fasting CBG am 108-260, noon 147-290, pm 101-303. Continue to monitor CBGs as well as the patient.       Falls frequently Denied chest pain, palpitation, or change of LOC. Stated she fall because of her legs became weak. Sustained a large bruise in her lower back--pain is more than her usual lower back pain but is still tolerable with her daily routine activities.   Back pain Chronic, pain is controlled with Fentanyl 180mcg/hr and Norco 10/325 q4hr      Unspecified hypothyroidism Corrected with Levothyroxine 112.1mcg, last TSH 3.677 01/24/13      Depression Stable on Cymbalta 30mg        Leg swelling Chronic venous insufficiency and dermatitis. Will increase  Furosemide 60mg  bid and continue with  Spironolactone 25mg  bid, Bun/creat 44/1.28 04/09/13      Essential tremor Mild head and hands tremor-not disabling. Takes Propranolol 10mg  bid      HTN (hypertension) Controlled on Losartan 100mg  daily            Family/ staff Communication: safety precaution, observe the patient   Goals of care: SNF   Labs/tests ordered none.

## 2013-04-23 NOTE — Assessment & Plan Note (Signed)
Denied chest pain, palpitation, or change of LOC. Stated she fall because of her legs became weak. Sustained a large bruise in her lower back--pain is more than her usual lower back pain but is still tolerable with her daily routine activities.

## 2013-04-23 NOTE — Assessment & Plan Note (Signed)
Mild head and hands tremor-not disabling. Takes Propranolol 10mg bid                                              

## 2013-04-23 NOTE — Assessment & Plan Note (Signed)
Chronic venous insufficiency and dermatitis. Will increase  Furosemide 60mg  bid and continue with  Spironolactone 25mg  bid, Bun/creat 44/1.28 04/09/13

## 2013-04-23 NOTE — Assessment & Plan Note (Addendum)
Not controlled, Hgb A1c 9.8 01/24/13, CBG am 78-230, noon 152-351, pm 149-263, continue Lantus 24units daily, change Novolog to 8 units with breakfast/dinner, 12 units with lunch for CBG>100. CBG 50s--OJ 120cc--CBG 75 x1 04/21/13. Otherwise fasting CBG am 108-260, noon 147-290, pm 101-303. Continue to monitor CBGs as well as the patient.

## 2013-04-23 NOTE — Assessment & Plan Note (Signed)
Controlled on Losartan 100 mg daily.

## 2013-04-23 NOTE — Assessment & Plan Note (Signed)
Stable on Cymbalta 30mg  

## 2013-05-24 ENCOUNTER — Encounter: Payer: Self-pay | Admitting: Nurse Practitioner

## 2013-05-24 ENCOUNTER — Non-Acute Institutional Stay (SKILLED_NURSING_FACILITY): Payer: Medicare Other | Admitting: Nurse Practitioner

## 2013-05-24 DIAGNOSIS — E1159 Type 2 diabetes mellitus with other circulatory complications: Secondary | ICD-10-CM

## 2013-05-24 DIAGNOSIS — F329 Major depressive disorder, single episode, unspecified: Secondary | ICD-10-CM

## 2013-05-24 DIAGNOSIS — G250 Essential tremor: Secondary | ICD-10-CM

## 2013-05-24 DIAGNOSIS — I10 Essential (primary) hypertension: Secondary | ICD-10-CM

## 2013-05-24 DIAGNOSIS — M549 Dorsalgia, unspecified: Secondary | ICD-10-CM

## 2013-05-24 DIAGNOSIS — E039 Hypothyroidism, unspecified: Secondary | ICD-10-CM

## 2013-05-24 DIAGNOSIS — M7989 Other specified soft tissue disorders: Secondary | ICD-10-CM

## 2013-05-24 DIAGNOSIS — G252 Other specified forms of tremor: Secondary | ICD-10-CM

## 2013-05-24 NOTE — Assessment & Plan Note (Signed)
Stable on Cymbalta 30mg  

## 2013-05-24 NOTE — Assessment & Plan Note (Addendum)
Mild head and hands tremor-not disabling. Takes Propranolol 10mg  bid      Mild head and hands tremor-not disabling. Takes Propranolol 10mg  bid

## 2013-05-24 NOTE — Progress Notes (Signed)
Patient ID: [name], female   DOB: Aug 18, 1926, 80 y.o.   MRN: [id number] Code Status: DNR  Allergies  Allergen Reactions  . Penicillins Other (See Comments)    Per MAR  . Bee Venom Other (See Comments)    Per MAR  . Celebrex (Celecoxib) Other (See Comments)    Per MAR  . Lidocaine Hcl Other (See Comments)    Per MAR  . Phenobarbital Other (See Comments)    Per MAR  . Procaine Hcl Other (See Comments)    Per mar    Chief Complaint  Patient presents with  . Medical Managment of Chronic Issues    HPI: Patient is a 79 y.o. female seen in the SNF at [facility] today for evaluation of her chronic medical conditions.  Problem List Items Addressed This Visit   Back pain     Chronic, pain is controlled with Fentanyl 158mcg/hr and Norco 10/325 q4hr          Depression     Stable on Cymbalta 30mg            Essential tremor     Still had angina occasionally, f/u Cardiology, continue with Imdur 60mg  and Prn NTG(last dose 02/12/13)      HTN (hypertension)     Controlled on Losartan 100mg  daily            Leg swelling     Chronic venous insufficiency and dermatitis. Furosemide 60mg  am and 40mg  pm with  Spironolactone 25mg  bid, Bun/creat 44/1.28 04/09/13--update BMP          Type II or unspecified type diabetes mellitus with peripheral circulatory disorders, uncontrolled(250.72) - Primary (Chronic)     Not controlled, Hgb A1c 9.8 01/24/13, takes Lantus 24 units daily, Novolog 8 unit with breakfast and dinner, 12 units with lunch for CBG>100, repeat Hgb A1c.           Unspecified hypothyroidism     Corrected with Levothyroxine 112.28mcg, last TSH 3.677 01/24/13, update TSH             Review of Systems:  Review of Systems  Constitutional: Negative for fever, chills, weight loss, malaise/fatigue and diaphoresis.  HENT: Positive for hearing loss. Negative for congestion, sore throat, neck pain and ear discharge.   Eyes: Negative for pain,  discharge and redness.  Respiratory: Positive for shortness of breath. Negative for cough, sputum production and wheezing.   Cardiovascular: Positive for chest pain, palpitations, leg swelling and PND. Negative for orthopnea and claudication.  Gastrointestinal: Negative for heartburn, nausea, vomiting, abdominal pain, diarrhea, constipation and blood in stool.  Genitourinary: Positive for frequency. Negative for dysuria, urgency and flank pain.  Musculoskeletal: Positive for myalgias, back pain, joint pain and falls.  Skin: Positive for rash (chronic BLE venous dermatitis. Left medial forefoot diabetic foot ulcer). Negative for itching.       A new large bruise at lower back sustained from her fall.   Neurological: Positive for tremors. Negative for dizziness, tingling, sensory change, speech change, focal weakness, seizures, loss of consciousness, weakness and headaches.  Endo/Heme/Allergies: Negative for environmental allergies and polydipsia. Does not bruise/bleed easily.  Psychiatric/Behavioral: Positive for memory loss. Negative for depression and hallucinations. The patient is nervous/anxious. The patient does not have insomnia.      Past Medical History  Diagnosis Date  . Diabetes mellitus   . Hypertension   . Heart murmur   . Benign familial tremor   . Rosacea   . Diverticulitis   .  Heart failure     diastolic heart failure  . TIA (transient ischemic attack) 2010  . Arthritis   . Breast cancer     breast -rt  . CAD (coronary artery disease)     single vessel CAD per cath in 2006; scattered nonobstructive disease in the left system, left to right collaterals to the RCA which was occluded by flush shots; she has been managed medically   Past Surgical History  Procedure Laterality Date  . Mastectomy partial / lumpectomy  11/02/2000    Right, with SLN  . Mastectomy partial / lumpectomy  05/15/2008    Left  . Breast lumpectomy  01/19/2010    right  . Mastectomy  03/31/2010     Right   Social History:   reports that she has never smoked. She does not have any smokeless tobacco history on file. She reports that she does not drink alcohol or use illicit drugs.  History reviewed. No pertinent family history.  Medications: Reviewed at [facility]   Physical Exam: Physical Exam  Constitutional: She appears well-developed.  HENT:  Head: Normocephalic and atraumatic.  Left Ear: External ear normal.  Eyes: Conjunctivae and EOM are normal. Pupils are equal, round, and reactive to light.  Neck: Neck supple. No JVD present. No thyromegaly present.  Cardiovascular: Normal rate and regular rhythm.   Murmur heard. Pulmonary/Chest: Effort normal.  Abdominal: Soft. There is no tenderness. There is no rebound.  Musculoskeletal: She exhibits edema.  Lymphadenopathy:    She has no cervical adenopathy.  Neurological: She is alert. No cranial nerve deficit. Coordination normal.  Skin: Skin is warm and dry. Rash noted.     Swelling, fever, warmth, tenderness, scaly, weeping-slightly better in BLE. A new large bruise in the middle of the lower back--observe.   Psychiatric: Her mood appears anxious. Her affect is not blunt, not labile and not inappropriate. Her speech is rapid and/or pressured (occasionally ). Her speech is not delayed and not slurred. She is hyperactive. She is not agitated, not aggressive, not slowed, not withdrawn, not actively hallucinating and not combative. Thought content is not paranoid and not delusional. She does not exhibit a depressed mood. She exhibits abnormal recent memory.    Filed Vitals:   05/24/13 1619  BP: 132/76  Pulse: 80  Temp: 98.3 F (36.8 C)  TempSrc: Tympanic  Resp: 20      Labs reviewed: Basic Metabolic Panel:  Recent Labs  16/10/96 1852 01/24/13 04/01/13 04/09/13  NA 139 139 137 136*  K 4.5 4.6 3.9 4.8  CL 98  --   --   --   CO2 29  --   --   --   GLUCOSE 255*  --   --   --   BUN 47* 41* 40* 44*  CREATININE 0.94 1.1  1.0 1.3*  CALCIUM 9.8  --   --   --   TSH 1.511 3.68  --   --    Liver Function Tests:  Recent Labs  11/13/12 1852  AST 21  ALT 19  ALKPHOS 74  BILITOT 0.4  PROT 7.5  ALBUMIN 3.6    CBC:  Recent Labs  11/13/12 1852 12/20/12 04/01/13  WBC 7.8 8.3 7.2  NEUTROABS 5.8  --   --   HGB 13.6 14.3 14.7  HCT 42.4 42 43  MCV 98.1  --   --   PLT 196 184 220       Assessment/Plan Type II or unspecified type diabetes mellitus  with peripheral circulatory disorders, uncontrolled(250.72) Not controlled, Hgb A1c 9.8 01/24/13, takes Lantus 24 units daily, Novolog 8 unit with breakfast and dinner, 12 units with lunch for CBG>100, repeat Hgb A1c.         Back pain Chronic, pain is controlled with Fentanyl 172mcg/hr and Norco 10/325 q4hr        Unspecified hypothyroidism Corrected with Levothyroxine 112.17mcg, last TSH 3.677 01/24/13, update TSH        Depression Stable on Cymbalta 30mg          Leg swelling Chronic venous insufficiency and dermatitis. Furosemide 60mg  am and 40mg  pm with  Spironolactone 25mg  bid, Bun/creat 44/1.28 04/09/13--update BMP        HTN (hypertension) Controlled on Losartan 100mg  daily          Essential tremor Still had angina occasionally, f/u Cardiology, continue with Imdur 60mg  and Prn NTG(last dose 02/12/13)      Family/ Staff Communication: observe the patient.   Goals of Care: SNF  Labs/tests ordered: Hgb a1c, TSH, BMP

## 2013-05-24 NOTE — Assessment & Plan Note (Signed)
Corrected with Levothyroxine 112.26mcg, last TSH 3.677 01/24/13, update TSH

## 2013-05-24 NOTE — Assessment & Plan Note (Signed)
Chronic, pain is controlled with Fentanyl 100mcg/hr and Norco 10/325 q4hr                                         

## 2013-05-24 NOTE — Assessment & Plan Note (Signed)
Not controlled, Hgb A1c 9.8 01/24/13, takes Lantus 24 units daily, Novolog 8 unit with breakfast and dinner, 12 units with lunch for CBG>100, repeat Hgb A1c.

## 2013-05-24 NOTE — Assessment & Plan Note (Signed)
Controlled on Losartan 100 mg daily.

## 2013-05-24 NOTE — Assessment & Plan Note (Signed)
Chronic venous insufficiency and dermatitis. Furosemide 60mg  am and 40mg  pm with  Spironolactone 25mg  bid, Bun/creat 44/1.28 04/09/13--update BMP

## 2013-05-27 LAB — BASIC METABOLIC PANEL
BUN: 44 mg/dL — AB (ref 4–21)
Creatinine: 1.3 mg/dL — AB (ref 0.5–1.1)
Glucose: 198 mg/dL
Potassium: 4.5 mmol/L (ref 3.4–5.3)

## 2013-06-04 ENCOUNTER — Non-Acute Institutional Stay (SKILLED_NURSING_FACILITY): Payer: Medicare Other | Admitting: Nurse Practitioner

## 2013-06-04 ENCOUNTER — Encounter: Payer: Self-pay | Admitting: Nurse Practitioner

## 2013-06-04 DIAGNOSIS — E039 Hypothyroidism, unspecified: Secondary | ICD-10-CM

## 2013-06-04 DIAGNOSIS — M549 Dorsalgia, unspecified: Secondary | ICD-10-CM

## 2013-06-04 DIAGNOSIS — E1159 Type 2 diabetes mellitus with other circulatory complications: Secondary | ICD-10-CM

## 2013-06-04 DIAGNOSIS — I10 Essential (primary) hypertension: Secondary | ICD-10-CM

## 2013-06-04 DIAGNOSIS — I259 Chronic ischemic heart disease, unspecified: Secondary | ICD-10-CM

## 2013-06-04 DIAGNOSIS — F32A Depression, unspecified: Secondary | ICD-10-CM

## 2013-06-04 DIAGNOSIS — G250 Essential tremor: Secondary | ICD-10-CM

## 2013-06-04 DIAGNOSIS — F329 Major depressive disorder, single episode, unspecified: Secondary | ICD-10-CM

## 2013-06-04 DIAGNOSIS — F3289 Other specified depressive episodes: Secondary | ICD-10-CM

## 2013-06-04 DIAGNOSIS — K5900 Constipation, unspecified: Secondary | ICD-10-CM

## 2013-06-04 DIAGNOSIS — B3789 Other sites of candidiasis: Secondary | ICD-10-CM

## 2013-06-04 DIAGNOSIS — M7989 Other specified soft tissue disorders: Secondary | ICD-10-CM

## 2013-06-04 NOTE — Assessment & Plan Note (Signed)
Controlled on Losartan 100 mg daily.

## 2013-06-04 NOTE — Assessment & Plan Note (Signed)
Chronic, pain is controlled with Fentanyl 100mcg/hr and Norco 10/325 q4hr--was c/o "hip pain"--then found out was pain in her intergluteal cleft Venora to her skin breakdown.            

## 2013-06-04 NOTE — Assessment & Plan Note (Signed)
Intergluteal cleft and 1/3 R+L inner buttocks--moist, heat, pressure, poor perineal area hygiene in setting of diabetes contribute to the problem-- assist with patient with good personal hygiene and apply Mycolog II cream bid to affected area for now.

## 2013-06-04 NOTE — Assessment & Plan Note (Signed)
Improved, Hgb A1c from 9.8 02/11/13 to 7.7 05/27/13. Takes Lantus 24units qd, Novolog 12 units with lunch and 8 units breakfast/dinner for CBG>100

## 2013-06-04 NOTE — Assessment & Plan Note (Addendum)
Stable on colace 100 x2 qhs and Amitiza 24mcg bid and MiraLax daily             

## 2013-06-04 NOTE — Assessment & Plan Note (Signed)
Mild head and hands tremor-not disabling. Takes Propranolol 10mg bid                                              

## 2013-06-04 NOTE — Assessment & Plan Note (Signed)
Chronic venous insufficiency and dermatitis. Furosemide 60mg  am and 40mg  pm with  Spironolactone 25mg  bid, Bun/creat 44/1.33 05/27/13

## 2013-06-04 NOTE — Assessment & Plan Note (Signed)
Still had angina occasionally, f/u Cardiology, continue with Imdur 60mg and Prn NTG(last dose 02/12/13)                         

## 2013-06-04 NOTE — Assessment & Plan Note (Signed)
Stable on Cymbalta 30mg  

## 2013-06-04 NOTE — Progress Notes (Signed)
Patient ID: [name], female   DOB: 03-03-1926, 79 y.o.   MRN: [id number] Code Status: MOST  Allergies  Allergen Reactions  . Penicillins Other (See Comments)    Per MAR  . Bee Venom Other (See Comments)    Per MAR  . Celebrex (Celecoxib) Other (See Comments)    Per MAR  . Lidocaine Hcl Other (See Comments)    Per MAR  . Phenobarbital Other (See Comments)    Per MAR  . Procaine Hcl Other (See Comments)    Per mar    Chief Complaint  Patient presents with  . Medical Managment of Chronic Issues    intergluteal cleft redness, tenderness, supreficial skin missing.    HPI: Patient is a 79 y.o. female seen in the clinic at [facility] today for evaluation of intergluteal skin breakdown and other chronic medical conditions.  Problem List Items Addressed This Visit   Back pain     Chronic, pain is controlled with Fentanyl 174mcg/hr and Norco 10/325 q4hr--was c/o "hip pain"--then found out was pain in her intergluteal cleft Alycia to her skin breakdown.             Candidiasis of anus     Intergluteal cleft and 1/3 R+L inner buttocks--moist, heat, pressure, poor perineal area hygiene in setting of diabetes contribute to the problem-- assist with patient with good personal hygiene and apply Mycolog II cream bid to affected area for now.     Depression     Stable on Cymbalta 30mg              Essential tremor     Mild head and hands tremor-not disabling. Takes Propranolol 10mg  bid                   HTN (hypertension)     Controlled on Losartan 100mg  daily              Ischemic heart disease     Still had angina occasionally, f/u Cardiology, continue with Imdur 60mg  and Prn NTG(last dose 02/12/13)      Leg swelling     Chronic venous insufficiency and dermatitis. Furosemide 60mg  am and 40mg  pm with  Spironolactone 25mg  bid, Bun/creat 44/1.33 05/27/13            Type II or unspecified type diabetes mellitus with peripheral  circulatory disorders, uncontrolled(250.72) (Chronic)     Improved, Hgb A1c from 9.8 02/11/13 to 7.7 05/27/13. Takes Lantus 24units qd, Novolog 12 units with lunch and 8 units breakfast/dinner for CBG>100    Unspecified constipation - Primary     Stable on colace 100 x2 qhs and Amitiza bid and MiraLax daily      Unspecified hypothyroidism     Corrected with Levothyroxine 112.55mcg, last TSH 3.677 01/24/13, updated NWG9.562 05/27/13               Review of Systems:  Review of Systems  Constitutional: Negative for fever, chills, weight loss, malaise/fatigue and diaphoresis.  HENT: Positive for hearing loss. Negative for congestion, sore throat, neck pain and ear discharge.   Eyes: Negative for pain, discharge and redness.  Respiratory: Negative for cough, sputum production, shortness of breath and wheezing.   Cardiovascular: Positive for leg swelling and PND. Negative for chest pain, palpitations, orthopnea and claudication.  Gastrointestinal: Negative for heartburn, nausea, vomiting, abdominal pain, diarrhea, constipation and blood in stool.  Genitourinary: Positive for frequency. Negative for dysuria, urgency and flank pain.  Musculoskeletal: Positive for  myalgias, back pain, joint pain and falls.  Skin: Positive for rash (chronic BLE venous dermatitis. Left medial forefoot diabetic foot ulcer). Negative for itching.       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 Intergluteal cleft erythema and superficial skin missing. Left forefoot diabetic ulcers  Neurological: Positive for tremors. Negative for dizziness, tingling, sensory change, speech change, focal weakness, seizures, loss of consciousness, weakness and headaches.  Endo/Heme/Allergies: Negative for environmental allergies and polydipsia. Does not bruise/bleed easily.  Psychiatric/Behavioral: Positive for memory loss. Negative for depression and hallucinations. The patient is nervous/anxious. The patient does not have insomnia.        Past Medical History  Diagnosis Date  . Diabetes mellitus   . Hypertension   . Heart murmur   . Benign familial tremor   . Rosacea   . Diverticulitis   . Heart failure     diastolic heart failure  . TIA (transient ischemic attack) 2010  . Arthritis   . Breast cancer     breast -rt  . CAD (coronary artery disease)     single vessel CAD per cath in 2006; scattered nonobstructive disease in the left system, left to right collaterals to the RCA which was occluded by flush shots; she has been managed medically   Past Surgical History  Procedure Laterality Date  . Mastectomy partial / lumpectomy  11/02/2000    Right, with SLN  . Mastectomy partial / lumpectomy  05/15/2008    Left  . Breast lumpectomy  01/19/2010    right  . Mastectomy  03/31/2010    Right   Social History:   reports that she has never smoked. She does not have any smokeless tobacco history on file. She reports that she does not drink alcohol or use illicit drugs.  History reviewed. No pertinent family history.  Medications: Reviewed at [facility]   Physical Exam: Physical Exam  Constitutional: She appears well-developed.  HENT:  Head: Normocephalic and atraumatic.  Left Ear: External ear normal.  Eyes: Conjunctivae and EOM are normal. Pupils are equal, round, and reactive to light.  Neck: Neck supple. No JVD present. No thyromegaly present.  Cardiovascular: Normal rate and regular rhythm.   Murmur heard. Pulmonary/Chest: Effort normal.  Abdominal: Soft. There is no tenderness. There is no rebound.  Musculoskeletal: She exhibits edema.  Lymphadenopathy:    She has no cervical adenopathy.  Neurological: She is alert. No cranial nerve deficit. Coordination normal.  Skin: Skin is warm and dry. Rash noted.     Erythematous and chronic swelling in BLE. Left forefoot diabetic ulcers. Intergluteal cleft skin breakdown with superficial skin missing and erythematous extended to the R+L 1/3 of inner buttocks.    Psychiatric: Her mood appears anxious. Her affect is not blunt, not labile and not inappropriate. Her speech is rapid and/or pressured (occasionally ). Her speech is not delayed and not slurred. She is hyperactive. She is not agitated, not aggressive, not slowed, not withdrawn, not actively hallucinating and not combative. Thought content is not paranoid and not delusional. She does not exhibit a depressed mood. She exhibits abnormal recent memory.    Filed Vitals:   06/04/13 1349  BP: 112/58  Pulse: 60  Temp: 98.5 F (36.9 C)  TempSrc: Tympanic  Resp: 20      Labs reviewed: Basic Metabolic Panel:  Recent Labs  08/65/78 1852  01/24/13 04/01/13 04/09/13 05/27/13  NA 139  < > 139 137 136* 137  K 4.5  --  4.6 3.9 4.8 4.5  CL 98  --   --   --   --   --   CO2 29  --   --   --   --   --   GLUCOSE 255*  --   --   --   --   --   BUN 47*  < > 41* 40* 44* 44*  CREATININE 0.94  < > 1.1 1.0 1.3* 1.3*  CALCIUM 9.8  --   --   --   --   --   TSH 1.511  --  3.68  --   --  3.96  < > = values in this interval not displayed. Liver Function Tests:  Recent Labs  11/13/12 1852  AST 21  ALT 19  ALKPHOS 74  BILITOT 0.4  PROT 7.5  ALBUMIN 3.6   No results found for this basename: LIPASE, AMYLASE,  in the last 8760 hours No results found for this basename: AMMONIA,  in the last 8760 hours CBC:  Recent Labs  11/13/12 1852 12/20/12 04/01/13  WBC 7.8 8.3 7.2  7.2  NEUTROABS 5.8  --   --   HGB 13.6 14.3 14.7  HCT 42.4 42 43  MCV 98.1  --   --   PLT 196 184 220       Assessment/Plan Unspecified constipation Stable on colace 100 x2 qhs and Amitiza bid and MiraLax daily    Type II or unspecified type diabetes mellitus with peripheral circulatory disorders, uncontrolled(250.72) Improved, Hgb A1c from 9.8 02/11/13 to 7.7 05/27/13. Takes Lantus 24units qd, Novolog 12 units with lunch and 8 units breakfast/dinner for CBG>100  Back pain Chronic, pain is controlled with  Fentanyl 173mcg/hr and Norco 10/325 q4hr--was c/o "hip pain"--then found out was pain in her intergluteal cleft Anicka to her skin breakdown.           Unspecified hypothyroidism Corrected with Levothyroxine 112.30mcg, last TSH 3.677 01/24/13, updated ZOX0.960 05/27/13          Depression Stable on Cymbalta 30mg            Essential tremor Mild head and hands tremor-not disabling. Takes Propranolol 10mg  bid                 Leg swelling Chronic venous insufficiency and dermatitis. Furosemide 60mg  am and 40mg  pm with  Spironolactone 25mg  bid, Bun/creat 44/1.33 05/27/13          Ischemic heart disease Still had angina occasionally, f/u Cardiology, continue with Imdur 60mg  and Prn NTG(last dose 02/12/13)    HTN (hypertension) Controlled on Losartan 100mg  daily            Candidiasis of anus Intergluteal cleft and 1/3 R+L inner buttocks--moist, heat, pressure, poor perineal area hygiene in setting of diabetes contribute to the problem-- assist with patient with good personal hygiene and apply Mycolog II cream bid to affected area for now.     Family/ Staff Communication: observe the patient.   Goals of Care: SNF  Labs/tests ordered: none

## 2013-06-04 NOTE — Assessment & Plan Note (Signed)
Corrected with Levothyroxine 112.5mcg, last TSH 3.677 01/24/13, updated TSH3.965 05/27/13                   

## 2013-06-06 ENCOUNTER — Non-Acute Institutional Stay (SKILLED_NURSING_FACILITY): Payer: Medicare Other | Admitting: Nurse Practitioner

## 2013-06-06 ENCOUNTER — Encounter: Payer: Self-pay | Admitting: Nurse Practitioner

## 2013-06-06 DIAGNOSIS — M7989 Other specified soft tissue disorders: Secondary | ICD-10-CM

## 2013-06-06 DIAGNOSIS — I10 Essential (primary) hypertension: Secondary | ICD-10-CM

## 2013-06-06 DIAGNOSIS — E1169 Type 2 diabetes mellitus with other specified complication: Secondary | ICD-10-CM

## 2013-06-06 DIAGNOSIS — L97509 Non-pressure chronic ulcer of other part of unspecified foot with unspecified severity: Secondary | ICD-10-CM

## 2013-06-06 DIAGNOSIS — E11621 Type 2 diabetes mellitus with foot ulcer: Secondary | ICD-10-CM

## 2013-06-06 NOTE — Progress Notes (Signed)
Patient ID: [name], female   DOB: May 17, 1926, 79 y.o.   MRN: [id number] Code Status: DNR MOST  Allergies  Allergen Reactions  . Penicillins Other (See Comments)    Per MAR  . Bee Venom Other (See Comments)    Per MAR  . Celebrex (Celecoxib) Other (See Comments)    Per MAR  . Lidocaine Hcl Other (See Comments)    Per MAR  . Phenobarbital Other (See Comments)    Per MAR  . Procaine Hcl Other (See Comments)    Per mar    Chief Complaint  Patient presents with  . Medical Managment of Chronic Issues    left medial forefoot diabetic ulcer.     HPI: Patient is a 79 y.o. female seen in the SNF  at [facility] today for evaluation of the left fore foot diabetic foot ulcer and other chronic medical conditions.  Problem List Items Addressed This Visit   Diabetic foot ulcer - Primary     Medial plantar aspect of the left forefoot--larger, tunneling, but center of the growth is flattened below the skin surface that the patient is able to walk on w/o excessive pain, basically the wound is clean--bled when attempt of debriding at the edge of the wound--will pack the wound with hydrogel Nu gauze daily-observe the wound.     HTN (hypertension)     Controlled on Losartan 100mg  daily                Leg swelling     Chronic venous insufficiency and dermatitis. Furosemide 60mg  am and 40mg  pm with  Spironolactone 25mg  bid, Bun/creat 44/1.33 05/27/13--much improved with only trace edema seen.                  Review of Systems:  Review of Systems  Constitutional: Negative for fever, chills, weight loss, malaise/fatigue and diaphoresis.  HENT: Positive for hearing loss. Negative for congestion, sore throat, neck pain and ear discharge.   Eyes: Negative for pain, discharge and redness.  Respiratory: Negative for cough, sputum production, shortness of breath and wheezing.   Cardiovascular: Positive for leg swelling and PND. Negative for chest pain, palpitations,  orthopnea and claudication.  Gastrointestinal: Negative for heartburn, nausea, vomiting, abdominal pain, diarrhea, constipation and blood in stool.  Genitourinary: Positive for frequency. Negative for dysuria, urgency and flank pain.  Musculoskeletal: Positive for myalgias, back pain, joint pain and falls.  Skin: Positive for rash (chronic BLE venous dermatitis. Left medial forefoot diabetic foot ulcer). Negative for itching.       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 Intergluteal cleft erythema and superficial skin missing. Left forefoot diabetic ulcer--larger, deeper, tunneling-but wound bed is clear and margin is easily bleed when debridement was attempt-the center wound tissue is lower than the skin surface that the patient is able to walk with excessive pain.   Neurological: Positive for tremors. Negative for dizziness, tingling, sensory change, speech change, focal weakness, seizures, loss of consciousness, weakness and headaches.  Endo/Heme/Allergies: Negative for environmental allergies and polydipsia. Does not bruise/bleed easily.  Psychiatric/Behavioral: Positive for memory loss. Negative for depression and hallucinations. The patient is nervous/anxious. The patient does not have insomnia.      Past Medical History  Diagnosis Date  . Diabetes mellitus   . Hypertension   . Heart murmur   . Benign familial tremor   . Rosacea   . Diverticulitis   . Heart failure     diastolic heart failure  . TIA (transient  ischemic attack) 2010  . Arthritis   . Breast cancer     breast -rt  . CAD (coronary artery disease)     single vessel CAD per cath in 2006; scattered nonobstructive disease in the left system, left to right collaterals to the RCA which was occluded by flush shots; she has been managed medically   Past Surgical History  Procedure Laterality Date  . Mastectomy partial / lumpectomy  11/02/2000    Right, with SLN  . Mastectomy partial / lumpectomy  05/15/2008    Left  . Breast  lumpectomy  01/19/2010    right  . Mastectomy  03/31/2010    Right   Social History:   reports that she has never smoked. She does not have any smokeless tobacco history on file. She reports that she does not drink alcohol or use illicit drugs.  History reviewed. No pertinent family history.  Medications: Reviewed at [facility]   Physical Exam: Physical Exam  Constitutional: She appears well-developed.  HENT:  Head: Normocephalic and atraumatic.  Left Ear: External ear normal.  Eyes: Conjunctivae and EOM are normal. Pupils are equal, round, and reactive to light.  Neck: Neck supple. No JVD present. No thyromegaly present.  Cardiovascular: Normal rate and regular rhythm.   Murmur heard. Pulmonary/Chest: Effort normal.  Abdominal: Soft. There is no tenderness. There is no rebound.  Musculoskeletal: She exhibits edema.  Lymphadenopathy:    She has no cervical adenopathy.  Neurological: She is alert. No cranial nerve deficit. Coordination normal.  Skin: Skin is warm and dry. Rash noted.     Erythematous and chronic swelling in BLE. Left forefoot diabetic ulcer-larger and deeper in size-wound bed is clean with tunneling(priviously protruding center is flattened and is lower than the skin surface.  Intergluteal cleft skin breakdown with superficial skin missing and erythematous extended to the R+L 1/3 of inner buttocks.   Psychiatric: Her mood appears anxious. Her affect is not blunt, not labile and not inappropriate. Her speech is rapid and/or pressured (occasionally ). Her speech is not delayed and not slurred. She is hyperactive. She is not agitated, not aggressive, not slowed, not withdrawn, not actively hallucinating and not combative. Thought content is not paranoid and not delusional. She does not exhibit a depressed mood. She exhibits abnormal recent memory.    Filed Vitals:   06/07/13 1026  BP: 140/71  Pulse: 83  Temp: 97.5 F (36.4 C)  TempSrc: Tympanic  Resp: 20      Labs  reviewed: Basic Metabolic Panel:  Recent Labs  16/10/96 1852  01/24/13 04/01/13 04/09/13 05/27/13  NA 139  < > 139 137 136* 137  K 4.5  --  4.6 3.9 4.8 4.5  CL 98  --   --   --   --   --   CO2 29  --   --   --   --   --   GLUCOSE 255*  --   --   --   --   --   BUN 47*  < > 41* 40* 44* 44*  CREATININE 0.94  < > 1.1 1.0 1.3* 1.3*  CALCIUM 9.8  --   --   --   --   --   TSH 1.511  --  3.68  --   --  3.96  < > = values in this interval not displayed. Liver Function Tests:  Recent Labs  11/13/12 1852  AST 21  ALT 19  ALKPHOS 74  BILITOT 0.4  PROT 7.5  ALBUMIN 3.6   No results found for this basename: LIPASE, AMYLASE,  in the last 8760 hours No results found for this basename: AMMONIA,  in the last 8760 hours CBC:  Recent Labs  11/13/12 1852 12/20/12 04/01/13  WBC 7.8 8.3 7.2  7.2  NEUTROABS 5.8  --   --   HGB 13.6 14.3 14.7  HCT 42.4 42 43  MCV 98.1  --   --   PLT 196 184 220         Assessment/Plan Diabetic foot ulcer Medial plantar aspect of the left forefoot--larger, tunneling, but center of the growth is flattened below the skin surface that the patient is able to walk on w/o excessive pain, basically the wound is clean--bled when attempt of debriding at the edge of the wound--will pack the wound with hydrogel Nu gauze daily-observe the wound.   Leg swelling Chronic venous insufficiency and dermatitis. Furosemide 60mg  am and 40mg  pm with  Spironolactone 25mg  bid, Bun/creat 44/1.33 05/27/13--much improved with only trace edema seen.             HTN (hypertension) Controlled on Losartan 100mg  daily                Family/ Staff Communication: observe the patient.   Goals of Care: SNF  Labs/tests ordered: none

## 2013-06-06 NOTE — Assessment & Plan Note (Signed)
Controlled on Losartan 100 mg daily.

## 2013-06-06 NOTE — Assessment & Plan Note (Signed)
Medial plantar aspect of the left forefoot--larger, tunneling, but center of the growth is flattened below the skin surface that the patient is able to walk on w/o excessive pain, basically the wound is clean--bled when attempt of debriding at the edge of the wound--will pack the wound with hydrogel Nu gauze daily-observe the wound.

## 2013-06-06 NOTE — Assessment & Plan Note (Signed)
Chronic venous insufficiency and dermatitis. Furosemide 60mg  am and 40mg  pm with  Spironolactone 25mg  bid, Bun/creat 44/1.33 05/27/13--much improved with only trace edema seen.

## 2013-06-07 ENCOUNTER — Encounter: Payer: Self-pay | Admitting: Nurse Practitioner

## 2013-06-18 ENCOUNTER — Encounter: Payer: Self-pay | Admitting: Nurse Practitioner

## 2013-06-18 ENCOUNTER — Non-Acute Institutional Stay (SKILLED_NURSING_FACILITY): Payer: Medicare Other | Admitting: Nurse Practitioner

## 2013-06-18 DIAGNOSIS — F3289 Other specified depressive episodes: Secondary | ICD-10-CM

## 2013-06-18 DIAGNOSIS — G250 Essential tremor: Secondary | ICD-10-CM

## 2013-06-18 DIAGNOSIS — K5900 Constipation, unspecified: Secondary | ICD-10-CM

## 2013-06-18 DIAGNOSIS — E1169 Type 2 diabetes mellitus with other specified complication: Secondary | ICD-10-CM

## 2013-06-18 DIAGNOSIS — E11621 Type 2 diabetes mellitus with foot ulcer: Secondary | ICD-10-CM

## 2013-06-18 DIAGNOSIS — M549 Dorsalgia, unspecified: Secondary | ICD-10-CM

## 2013-06-18 DIAGNOSIS — L8990 Pressure ulcer of unspecified site, unspecified stage: Secondary | ICD-10-CM

## 2013-06-18 DIAGNOSIS — L8992 Pressure ulcer of unspecified site, stage 2: Secondary | ICD-10-CM

## 2013-06-18 DIAGNOSIS — I10 Essential (primary) hypertension: Secondary | ICD-10-CM

## 2013-06-18 DIAGNOSIS — E1159 Type 2 diabetes mellitus with other circulatory complications: Secondary | ICD-10-CM

## 2013-06-18 DIAGNOSIS — M7989 Other specified soft tissue disorders: Secondary | ICD-10-CM

## 2013-06-18 DIAGNOSIS — F32A Depression, unspecified: Secondary | ICD-10-CM

## 2013-06-18 DIAGNOSIS — F329 Major depressive disorder, single episode, unspecified: Secondary | ICD-10-CM

## 2013-06-18 DIAGNOSIS — L97509 Non-pressure chronic ulcer of other part of unspecified foot with unspecified severity: Secondary | ICD-10-CM

## 2013-06-18 DIAGNOSIS — E039 Hypothyroidism, unspecified: Secondary | ICD-10-CM

## 2013-06-18 NOTE — Assessment & Plan Note (Signed)
Corrected with Levothyroxine 112.31mcg, last TSH 3.677 01/24/13, updated [id number] 05/27/13

## 2013-06-18 NOTE — Assessment & Plan Note (Signed)
Improved, Hgb A1c from 9.8 02/11/13 to 7.7 05/27/13. Takes Lantus 24units qd, Novolog 12 units with lunch and 8 units breakfast/dinner for CBG>100. x1 CBG 70s--asymptomatic--offer snack between meals and monitor CBGs

## 2013-06-18 NOTE — Assessment & Plan Note (Signed)
Chronic venous insufficiency and dermatitis. Furosemide 60mg am and 40mg pm with  Spironolactone 25mg bid, Bun/creat 44/1.33 05/27/13--much improved with only trace edema seen.            

## 2013-06-18 NOTE — Assessment & Plan Note (Signed)
Stable on colace 100 x2 qhs and Amitiza 24mcg bid and MiraLax daily             

## 2013-06-18 NOTE — Assessment & Plan Note (Signed)
Mild head and hands tremor-not disabling. Takes Propranolol 10mg bid                                              

## 2013-06-18 NOTE — Assessment & Plan Note (Addendum)
Controlled on Losartan 100mg  daily, monitor blood pressure daily due tx1 low Bp in 90/50

## 2013-06-18 NOTE — Assessment & Plan Note (Signed)
Stable on Cymbalta 30mg  

## 2013-06-18 NOTE — Progress Notes (Signed)
Patient ID: [name], female   DOB: February 01, 1926, 79 y.o.   MRN: [id number] Code Status: DNR  Allergies  Allergen Reactions  . Penicillins Other (See Comments)    Per MAR  . Bee Venom Other (See Comments)    Per MAR  . Celebrex (Celecoxib) Other (See Comments)    Per MAR  . Lidocaine Hcl Other (See Comments)    Per MAR  . Phenobarbital Other (See Comments)    Per MAR  . Procaine Hcl Other (See Comments)    Per mar    Chief Complaint  Patient presents with  . Medical Managment of Chronic Issues    pressure ulcers at R+L buttocks.     HPI: Patient is a 79 y.o. female seen in the SNF at [facility] today for evaluation of pressure ulcers @ the R+L buttocks and other chronic medical conditions.  Problem List Items Addressed This Visit   Back pain     Chronic, pain is controlled with Fentanyl 13mcg/hr and Norco 10/325 q4hr--was c/o "hip pain"--then found out was pain in her intergluteal cleft Nyeisha to her skin breakdown.               Depression     Stable on Cymbalta 30mg                Diabetic foot ulcer - Primary     Medial plantar aspect of the left forefoot--larger, tunneling, but center of the growth is flattened below the skin surface that the patient is able to walk on w/o excessive pain, continue to  pack the wound with hydrogel Nu gauze daily-observe the wound.       Essential tremor     Mild head and hands tremor-not disabling. Takes Propranolol 10mg  bid                     HTN (hypertension)     Controlled on Losartan 100mg  daily, monitor blood pressure daily due tx1 low Bp in 90/50                  Leg swelling     Chronic venous insufficiency and dermatitis. Furosemide 60mg  am and 40mg  pm with  Spironolactone 25mg  bid, Bun/creat 44/1.33 05/27/13--much improved with only trace edema seen.                 Pressure ulcer stage II     Right and left pressure ulcer stage II quarter size, silver  alginate to pack and cover with transparent film q3 days and prn.     Type II or unspecified type diabetes mellitus with peripheral circulatory disorders, uncontrolled(250.72) (Chronic)     Improved, Hgb A1c from 9.8 02/11/13 to 7.7 05/27/13. Takes Lantus 24units qd, Novolog 12 units with lunch and 8 units breakfast/dinner for CBG>100. x1 CBG 70s--asymptomatic--offer snack between meals and monitor CBGs      Unspecified constipation     Stable on colace 100 x2 qhs and Amitiza bid and MiraLax daily        Unspecified hypothyroidism     Corrected with Levothyroxine 112.67mcg, last TSH 3.677 01/24/13, updated [id number] 05/27/13                 Review of Systems:  Review of Systems  Constitutional: Negative for fever, chills, weight loss, malaise/fatigue and diaphoresis.  HENT: Positive for hearing loss. Negative for congestion, sore throat, neck pain and ear discharge.   Eyes: Negative for pain, discharge and  redness.  Respiratory: Negative for cough, sputum production, shortness of breath and wheezing.   Cardiovascular: Positive for leg swelling and PND. Negative for chest pain, palpitations, orthopnea and claudication.  Gastrointestinal: Negative for heartburn, nausea, vomiting, abdominal pain, diarrhea, constipation and blood in stool.  Genitourinary: Positive for frequency. Negative for dysuria, urgency and flank pain.  Musculoskeletal: Positive for myalgias, back pain, joint pain and falls.  Skin: Positive for rash (chronic BLE venous dermatitis. Left medial forefoot diabetic foot ulcer). Negative for itching.       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 Intergluteal cleft erythema and superficial skin missing. Left forefoot diabetic ulcer--larger, deeper, tunneling-but wound bed is clear and margin is easily bleed when debridement was attempt-the center wound tissue is lower than the skin surface that the patient is able to walk with excessive pain. New pressure ulcers at R+L  buttocks  Neurological: Positive for tremors. Negative for dizziness, tingling, sensory change, speech change, focal weakness, seizures, loss of consciousness, weakness and headaches.  Endo/Heme/Allergies: Negative for environmental allergies and polydipsia. Does not bruise/bleed easily.  Psychiatric/Behavioral: Positive for memory loss. Negative for depression and hallucinations. The patient is nervous/anxious. The patient does not have insomnia.      Past Medical History  Diagnosis Date  . Diabetes mellitus   . Hypertension   . Heart murmur   . Benign familial tremor   . Rosacea   . Diverticulitis   . Heart failure     diastolic heart failure  . TIA (transient ischemic attack) 2010  . Arthritis   . Breast cancer     breast -rt  . CAD (coronary artery disease)     single vessel CAD per cath in 2006; scattered nonobstructive disease in the left system, left to right collaterals to the RCA which was occluded by flush shots; she has been managed medically   Past Surgical History  Procedure Laterality Date  . Mastectomy partial / lumpectomy  11/02/2000    Right, with SLN  . Mastectomy partial / lumpectomy  05/15/2008    Left  . Breast lumpectomy  01/19/2010    right  . Mastectomy  03/31/2010    Right   Social History:   reports that she has never smoked. She does not have any smokeless tobacco history on file. She reports that she does not drink alcohol or use illicit drugs.    Medications: Reviewed at [facility]   Physical Exam: Physical Exam  Constitutional: She appears well-developed.  HENT:  Head: Normocephalic and atraumatic.  Left Ear: External ear normal.  Eyes: Conjunctivae and EOM are normal. Pupils are equal, round, and reactive to light.  Neck: Neck supple. No JVD present. No thyromegaly present.  Cardiovascular: Normal rate and regular rhythm.   Murmur heard. Pulmonary/Chest: Effort normal.  Abdominal: Soft. There is no tenderness. There is no rebound.    Musculoskeletal: She exhibits edema.  Lymphadenopathy:    She has no cervical adenopathy.  Neurological: She is alert. No cranial nerve deficit. Coordination normal.  Skin: Skin is warm and dry. Rash noted.     Erythematous and chronic swelling in BLE. Left forefoot diabetic ulcer-larger and deeper in size-wound bed is clean with tunneling(priviously protruding center is flattened and is lower than the skin surface.  Intergluteal cleft skin breakdown with superficial skin missing and erythematous extended to the R+L 1/3 of inner buttocks--new pressure ulcers R+L buttocks near the intergluteal cleft with mirror imaging about a quarter seize stage II presently.    Psychiatric: Her mood  appears anxious. Her affect is not blunt, not labile and not inappropriate. Her speech is rapid and/or pressured (occasionally ). Her speech is not delayed and not slurred. She is hyperactive. She is not agitated, not aggressive, not slowed, not withdrawn, not actively hallucinating and not combative. Thought content is not paranoid and not delusional. She does not exhibit a depressed mood. She exhibits abnormal recent memory.    Filed Vitals:   06/18/13 1510  BP: 98/54  Pulse: 86  Temp: 97.6 F (36.4 C)  TempSrc: Tympanic  Resp: 20      Labs reviewed: Basic Metabolic Panel:  Recent Labs  40/98/11 1852  01/24/13 04/01/13 04/09/13 05/27/13  NA 139  < > 139 137 136* 137  K 4.5  --  4.6 3.9 4.8 4.5  CL 98  --   --   --   --   --   CO2 29  --   --   --   --   --   GLUCOSE 255*  --   --   --   --   --   BUN 47*  < > 41* 40* 44* 44*  CREATININE 0.94  < > 1.1 1.0 1.3* 1.3*  CALCIUM 9.8  --   --   --   --   --   TSH 1.511  --  3.68  --   --  3.96  < > = values in this interval not displayed. Liver Function Tests:  Recent Labs  11/13/12 1852  AST 21  ALT 19  ALKPHOS 74  BILITOT 0.4  PROT 7.5  ALBUMIN 3.6    CBC:  Recent Labs  11/13/12 1852 12/20/12 04/01/13  WBC 7.8 8.3 7.2  7.2   NEUTROABS 5.8  --   --   HGB 13.6 14.3 14.7  HCT 42.4 42 43  MCV 98.1  --   --   PLT 196 184 220        Assessment/Plan Diabetic foot ulcer Medial plantar aspect of the left forefoot--larger, tunneling, but center of the growth is flattened below the skin surface that the patient is able to walk on w/o excessive pain, continue to  pack the wound with hydrogel Nu gauze daily-observe the wound.     Leg swelling Chronic venous insufficiency and dermatitis. Furosemide 60mg  am and 40mg  pm with  Spironolactone 25mg  bid, Bun/creat 44/1.33 05/27/13--much improved with only trace edema seen.               Unspecified hypothyroidism Corrected with Levothyroxine 112.51mcg, last TSH 3.677 01/24/13, updated [id number] 05/27/13            Depression Stable on Cymbalta 30mg              Essential tremor Mild head and hands tremor-not disabling. Takes Propranolol 10mg  bid                   Back pain Chronic, pain is controlled with Fentanyl 137mcg/hr and Norco 10/325 q4hr--was c/o "hip pain"--then found out was pain in her intergluteal cleft Anda to her skin breakdown.             HTN (hypertension) Controlled on Losartan 100mg  daily, monitor blood pressure daily due tx1 low Bp in 90/50                Unspecified constipation Stable on colace 100 x2 qhs and Amitiza bid and MiraLax daily      Pressure ulcer stage II Right and  left pressure ulcer stage II quarter size, silver alginate to pack and cover with transparent film q3 days and prn.   Type II or unspecified type diabetes mellitus with peripheral circulatory disorders, uncontrolled(250.72) Improved, Hgb A1c from 9.8 02/11/13 to 7.7 05/27/13. Takes Lantus 24units qd, Novolog 12 units with lunch and 8 units breakfast/dinner for CBG>100. x1 CBG 70s--asymptomatic--offer snack between meals and monitor CBGs      Family/ Staff Communication: observe the patient.    Goals of Care: SNF  Labs/tests ordered: none

## 2013-06-18 NOTE — Assessment & Plan Note (Signed)
Medial plantar aspect of the left forefoot--larger, tunneling, but center of the growth is flattened below the skin surface that the patient is able to walk on w/o excessive pain, continue to  pack the wound with hydrogel Nu gauze daily-observe the wound.

## 2013-06-18 NOTE — Assessment & Plan Note (Signed)
Right and left pressure ulcer stage II quarter size, silver alginate to pack and cover with transparent film q3 days and prn.

## 2013-06-18 NOTE — Assessment & Plan Note (Signed)
Chronic, pain is controlled with Fentanyl 183mcg/hr and Norco 10/325 q4hr--was c/o "hip pain"--then found out was pain in her intergluteal cleft Araiyah to her skin breakdown.

## 2013-06-27 ENCOUNTER — Encounter: Payer: Self-pay | Admitting: Internal Medicine

## 2013-06-27 DIAGNOSIS — I10 Essential (primary) hypertension: Secondary | ICD-10-CM

## 2013-06-27 DIAGNOSIS — M7989 Other specified soft tissue disorders: Secondary | ICD-10-CM

## 2013-06-27 DIAGNOSIS — F329 Major depressive disorder, single episode, unspecified: Secondary | ICD-10-CM

## 2013-06-27 DIAGNOSIS — S81802A Unspecified open wound, left lower leg, initial encounter: Secondary | ICD-10-CM

## 2013-06-27 DIAGNOSIS — E11621 Type 2 diabetes mellitus with foot ulcer: Secondary | ICD-10-CM | POA: Insufficient documentation

## 2013-06-27 DIAGNOSIS — E1159 Type 2 diabetes mellitus with other circulatory complications: Secondary | ICD-10-CM

## 2013-06-27 DIAGNOSIS — R609 Edema, unspecified: Secondary | ICD-10-CM | POA: Insufficient documentation

## 2013-06-27 NOTE — Progress Notes (Signed)
  Subjective:    Patient ID: [name], female    DOB: 1926/06/11, 79 y.o.   MRN: [id number]  HPI    Review of Systems     Objective:   Physical Exam   05/27/2013 BMP: Glucose 198, BUN 44, creatinine 1.33  A1c: 7.7     Assessment & Plan:   This encounter was created in error - please disregard.

## 2013-07-01 LAB — BASIC METABOLIC PANEL
Creatinine: 1.1 mg/dL (ref 0.5–1.1)
Potassium: 4.2 mmol/L (ref 3.4–5.3)
Sodium: 136 mmol/L — AB (ref 137–147)

## 2013-07-02 ENCOUNTER — Non-Acute Institutional Stay (SKILLED_NURSING_FACILITY): Payer: Medicare Other | Admitting: Nurse Practitioner

## 2013-07-02 ENCOUNTER — Encounter: Payer: Self-pay | Admitting: Nurse Practitioner

## 2013-07-02 DIAGNOSIS — E1169 Type 2 diabetes mellitus with other specified complication: Secondary | ICD-10-CM

## 2013-07-02 DIAGNOSIS — I10 Essential (primary) hypertension: Secondary | ICD-10-CM

## 2013-07-02 DIAGNOSIS — G250 Essential tremor: Secondary | ICD-10-CM

## 2013-07-02 DIAGNOSIS — S0003XS Contusion of scalp, sequela: Secondary | ICD-10-CM

## 2013-07-02 DIAGNOSIS — IMO0002 Reserved for concepts with insufficient information to code with codable children: Secondary | ICD-10-CM

## 2013-07-02 DIAGNOSIS — L97509 Non-pressure chronic ulcer of other part of unspecified foot with unspecified severity: Secondary | ICD-10-CM

## 2013-07-02 DIAGNOSIS — E11621 Type 2 diabetes mellitus with foot ulcer: Secondary | ICD-10-CM

## 2013-07-02 DIAGNOSIS — S0003XA Contusion of scalp, initial encounter: Secondary | ICD-10-CM | POA: Insufficient documentation

## 2013-07-02 DIAGNOSIS — M7989 Other specified soft tissue disorders: Secondary | ICD-10-CM

## 2013-07-02 NOTE — Assessment & Plan Note (Signed)
Between frontal and parietal about a chicken egg size sustained from a topple over when she attempted to pick up trash. No focal neurological deficit.

## 2013-07-02 NOTE — Assessment & Plan Note (Signed)
Left forefoot--better with Silvadene cream.

## 2013-07-02 NOTE — Assessment & Plan Note (Signed)
Chronic venous insufficiency and dermatitis. Better with Furosemide 80mg am and 40mg pm with  Spironolactone 25mg bid since 06/27/13--Bun/creat 47/1.12 07/01/13                   

## 2013-07-02 NOTE — Assessment & Plan Note (Signed)
Mild head and hands tremor-not disabling. Takes Propranolol 10mg bid                                              

## 2013-07-02 NOTE — Progress Notes (Signed)
Patient ID: [name], female   DOB: 1926/07/24, 79 y.o.   MRN: [id number] Code Status: DNR  Allergies  Allergen Reactions  . Penicillins Other (See Comments)    Per MAR  . Bee Venom Other (See Comments)    Per MAR  . Celebrex [Celecoxib] Other (See Comments)    Per MAR  . Lidocaine Hcl Other (See Comments)    Per MAR  . Phenobarbital Other (See Comments)    Per MAR  . Procaine Hcl Other (See Comments)    Per mar    Chief Complaint  Patient presents with  . Medical Managment of Chronic Issues    top of head hematoma and s/p fall.     HPI: Patient is a 79 y.o. female seen in the SNF at [facility] today for evaluation of frontal hematoma, s/p fall, leg edema, pressure ulcers @ the R+L buttocks, and other chronic medical conditions.  Problem List Items Addressed This Visit   Diabetic foot ulcer     Left forefoot--better with Silvadene cream.     Essential tremor     Mild head and hands tremor-not disabling. Takes Propranolol 10mg  bid                       Hematoma of frontal scalp - Primary     Between frontal and parietal about a chicken egg size sustained from a topple over when she attempted to pick up trash. No focal neurological deficit.     HTN (hypertension)     Controlled on Losartan 100mg  daily, monitor blood pressure daily                    Leg swelling     Chronic venous insufficiency and dermatitis. Better with Furosemide 80mg  am and 40mg  pm with  Spironolactone 25mg  bid since 06/27/13--Bun/creat 47/1.12 07/01/13                     Review of Systems:  Review of Systems  Constitutional: Negative for fever, chills, weight loss, malaise/fatigue and diaphoresis.  HENT: Positive for hearing loss. Negative for congestion, sore throat, neck pain and ear discharge.   Eyes: Negative for pain, discharge and redness.  Respiratory: Negative for cough, sputum production, shortness of breath and wheezing.     Cardiovascular: Positive for leg swelling and PND. Negative for chest pain, palpitations, orthopnea and claudication.  Gastrointestinal: Negative for heartburn, nausea, vomiting, abdominal pain, diarrhea, constipation and blood in stool.  Genitourinary: Positive for frequency. Negative for dysuria, urgency and flank pain.  Musculoskeletal: Positive for myalgias, back pain, joint pain and falls.  Skin: Positive for rash (chronic BLE venous dermatitis. Left medial forefoot diabetic foot ulcer). Negative for itching.       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 Intergluteal cleft erythema and superficial skin missing. Left forefoot diabetic ulcer--chronic about a quarter size. Pressure ulcers at R+L buttocks-in mirror image-2x2cm and superficial. Frontal hematoma about a chicken egg size.   Neurological: Positive for tremors. Negative for dizziness, tingling, sensory change, speech change, focal weakness, seizures, loss of consciousness, weakness and headaches.  Endo/Heme/Allergies: Negative for environmental allergies and polydipsia. Does not bruise/bleed easily.  Psychiatric/Behavioral: Positive for memory loss. Negative for depression and hallucinations. The patient is nervous/anxious. The patient does not have insomnia.      Past Medical History  Diagnosis Date  . Diabetes mellitus   . Hypertension   . Heart murmur   . Benign familial  tremor   . Rosacea   . Diverticulitis   . Heart failure     diastolic heart failure  . TIA (transient ischemic attack) 2010  . Arthritis   . Breast cancer     breast -rt  . CAD (coronary artery disease)     single vessel CAD per cath in 2006; scattered nonobstructive disease in the left system, left to right collaterals to the RCA which was occluded by flush shots; she has been managed medically   Past Surgical History  Procedure Laterality Date  . Mastectomy partial / lumpectomy  11/02/2000    Right, with SLN  . Mastectomy partial / lumpectomy  05/15/2008     Left  . Breast lumpectomy  01/19/2010    right  . Mastectomy  03/31/2010    Right   Social History:   reports that she has never smoked. She does not have any smokeless tobacco history on file. She reports that she does not drink alcohol or use illicit drugs.    Medications: Reviewed at [facility]: Lambert Rhonda W   Physical Exam: Physical Exam  Constitutional: She appears well-developed.  HENT:  Head: Normocephalic and atraumatic.  Left Ear: External ear normal.  Eyes: Conjunctivae and EOM are normal. Pupils are equal, round, and reactive to light.  Neck: Neck supple. No JVD present. No thyromegaly present.  Cardiovascular: Normal rate and regular rhythm.   Murmur heard. Pulmonary/Chest: Effort normal.  Abdominal: Soft. There is no tenderness. There is no rebound.  Musculoskeletal: She exhibits edema.  Lymphadenopathy:    She has no cervical adenopathy.  Neurological: She is alert. No cranial nerve deficit. Coordination normal.  Skin: Skin is warm and dry. Rash noted.     Erythematous and chronic swelling in BLE. Left forefoot diabetic ulcer-larger and deeper in size-wound bed is clean with tunneling(priviously protruding center is flattened and is lower than the skin surface.  Intergluteal cleft skin breakdown with superficial skin missing and erythematous extended to the R+L 1/3 of inner buttocks--new pressure ulcers R+L buttocks near the intergluteal cleft with mirror imaging about a quarter seize stage II presently.    Psychiatric: Her mood appears anxious. Her affect is not blunt, not labile and not inappropriate. Her speech is rapid and/or pressured (occasionally ). Her speech is not delayed and not slurred. She is hyperactive. She is not agitated, not aggressive, not slowed, not withdrawn, not actively hallucinating and not combative. Thought content is not paranoid and not delusional. She does not exhibit a depressed mood. She exhibits abnormal recent memory.    Filed Vitals:   07/02/13 1601   BP: 118/68  Pulse: 64  Temp: 97.5 F (36.4 C)  TempSrc: Tympanic  Resp: 16      Labs reviewed: Basic Metabolic Panel:  Recent Labs  16/10/96 1852 01/24/13  04/09/13 05/27/13 07/01/13  NA 139 139  < > 136* 137 136*  K 4.5 4.6  < > 4.8 4.5 4.2  CL 98  --   --   --   --   --   CO2 29  --   --   --   --   --   GLUCOSE 255*  --   --   --   --   --   BUN 47* 41*  < > 44* 44* 47*  CREATININE 0.94 1.1  < > 1.3* 1.3* 1.1  CALCIUM 9.8  --   --   --   --   --   TSH 1.511 3.68  --   --  3.96  --   < > = values in this interval not displayed. Liver Function Tests:  Recent Labs  11/13/12 1852  AST 21  ALT 19  ALKPHOS 74  BILITOT 0.4  PROT 7.5  ALBUMIN 3.6    CBC:  Recent Labs  11/13/12 1852 12/20/12 04/01/13  WBC 7.8 8.3 7.2  7.2  NEUTROABS 5.8  --   --   HGB 13.6 14.3 14.7  HCT 42.4 42 43  MCV 98.1  --   --   PLT 196 184 220        Assessment/Plan Hematoma of frontal scalp Between frontal and parietal about a chicken egg size sustained from a topple over when she attempted to pick up trash. No focal neurological deficit.   HTN (hypertension) Controlled on Losartan 100mg  daily, monitor blood pressure daily                  Leg swelling Chronic venous insufficiency and dermatitis. Better with Furosemide 80mg  am and 40mg  pm with  Spironolactone 25mg  bid since 06/27/13--Bun/creat 47/1.12 07/01/13                Diabetic foot ulcer Left forefoot--better with Silvadene cream.   Essential tremor Mild head and hands tremor-not disabling. Takes Propranolol 10mg  bid                       Family/ Staff Communication: observe the patient.   Goals of Care: SNF  Labs/tests ordered: none

## 2013-07-02 NOTE — Assessment & Plan Note (Addendum)
Controlled on Losartan 100mg daily, monitor blood pressure daily                   

## 2013-07-09 ENCOUNTER — Non-Acute Institutional Stay (SKILLED_NURSING_FACILITY): Payer: Medicare Other | Admitting: Nurse Practitioner

## 2013-07-09 ENCOUNTER — Encounter: Payer: Self-pay | Admitting: Nurse Practitioner

## 2013-07-09 DIAGNOSIS — E1159 Type 2 diabetes mellitus with other circulatory complications: Secondary | ICD-10-CM

## 2013-07-09 DIAGNOSIS — F3289 Other specified depressive episodes: Secondary | ICD-10-CM

## 2013-07-09 DIAGNOSIS — E039 Hypothyroidism, unspecified: Secondary | ICD-10-CM

## 2013-07-09 DIAGNOSIS — F32A Depression, unspecified: Secondary | ICD-10-CM

## 2013-07-09 DIAGNOSIS — M549 Dorsalgia, unspecified: Secondary | ICD-10-CM

## 2013-07-09 DIAGNOSIS — I10 Essential (primary) hypertension: Secondary | ICD-10-CM

## 2013-07-09 DIAGNOSIS — G250 Essential tremor: Secondary | ICD-10-CM

## 2013-07-09 DIAGNOSIS — K5900 Constipation, unspecified: Secondary | ICD-10-CM

## 2013-07-09 DIAGNOSIS — I259 Chronic ischemic heart disease, unspecified: Secondary | ICD-10-CM

## 2013-07-09 DIAGNOSIS — F329 Major depressive disorder, single episode, unspecified: Secondary | ICD-10-CM

## 2013-07-09 DIAGNOSIS — M7989 Other specified soft tissue disorders: Secondary | ICD-10-CM

## 2013-07-09 NOTE — Assessment & Plan Note (Signed)
Stable on Cymbalta 30mg  

## 2013-07-09 NOTE — Assessment & Plan Note (Signed)
Chronic venous insufficiency and dermatitis. Better with Furosemide 80mg am and 40mg pm with  Spironolactone 25mg bid since 06/27/13--Bun/creat 47/1.12 07/01/13                   

## 2013-07-09 NOTE — Assessment & Plan Note (Signed)
Stable on colace 100 x2 qhs and Amitiza bid and MiraLax daily

## 2013-07-09 NOTE — Assessment & Plan Note (Signed)
Improved, Hgb A1c from 9.8 02/11/13 to 7.7 05/27/13. Takes Lantus 24units qd, Novolog 12 units with lunch and 8 units breakfast/dinner for CBG>100. Am 138-78, lunch 261-110, dinner 281-111. Hs snack is encouraged.

## 2013-07-09 NOTE — Assessment & Plan Note (Signed)
Chronic, pain is controlled with Fentanyl 100mcg/hr and Norco 10/325 q4hr                                         

## 2013-07-09 NOTE — Assessment & Plan Note (Signed)
Still had angina occasionally, f/u Cardiology, continue with Imdur 60mg and Prn NTG(last dose 02/12/13)                         

## 2013-07-09 NOTE — Assessment & Plan Note (Signed)
Mild head and hands tremor-not disabling. Takes Propranolol 10mg bid                                              

## 2013-07-09 NOTE — Assessment & Plan Note (Signed)
Corrected with Levothyroxine 112.5mcg, last TSH 3.677 01/24/13, updated TSH3.965 05/27/13                   

## 2013-07-09 NOTE — Progress Notes (Signed)
Patient ID: [name], female   DOB: [birth year], 79 y.o.   MRN: [id number] Code Status: DNR  Allergies  Allergen Reactions  . Penicillins Other (See Comments)    Per MAR  . Bee Venom Other (See Comments)    Per MAR  . Celebrex [Celecoxib] Other (See Comments)    Per MAR  . Lidocaine Hcl Other (See Comments)    Per MAR  . Phenobarbital Other (See Comments)    Per MAR  . Procaine Hcl Other (See Comments)    Per mar    Chief Complaint  Patient presents with  . Medical Managment of Chronic Issues    blood sugar.     HPI: Patient is a 79 y.o. female seen in the SNF at [facility] today for evaluation of blood sugar(low in the past 2 ams), leg edema, pressure ulcers @ the R+L buttocks, and other chronic medical conditions.  Problem List Items Addressed This Visit   Back pain     Chronic, pain is controlled with Fentanyl 164mcg/hr and Norco 10/325 q4hr                Depression     Stable on Cymbalta 30mg                  Essential tremor     Mild head and hands tremor-not disabling. Takes Propranolol 10mg  bid                         HTN (hypertension)     Controlled on Losartan 100mg  daily, monitor blood pressure daily                      Ischemic heart disease     Still had angina occasionally, f/u Cardiology, continue with Imdur 60mg  and Prn NTG(last dose 02/12/13)      Leg swelling     Chronic venous insufficiency and dermatitis. Better with Furosemide 80mg  am and 40mg  pm with  Spironolactone 25mg  bid since 06/27/13--Bun/creat 47/1.12 07/01/13                    Type II or unspecified type diabetes mellitus with peripheral circulatory disorders, uncontrolled(250.72) - Primary (Chronic)     Improved, Hgb A1c from 9.8 02/11/13 to 7.7 05/27/13. Takes Lantus 24units qd, Novolog 12 units with lunch and 8 units breakfast/dinner for CBG>100. Am 138-78, lunch 261-110, dinner 281-111. Hs snack is  encouraged.         Unspecified constipation     Stable on colace 100 x2 qhs and Amitiza bid and MiraLax daily          Unspecified hypothyroidism (Chronic)     Corrected with Levothyroxine 112.19mcg, last TSH 3.677 01/24/13, updated WUJ8.119 05/27/13                   Review of Systems:  Review of Systems  Constitutional: Negative for fever, chills, weight loss, malaise/fatigue and diaphoresis.  HENT: Positive for hearing loss. Negative for congestion, sore throat, neck pain and ear discharge.   Eyes: Negative for pain, discharge and redness.  Respiratory: Negative for cough, sputum production, shortness of breath and wheezing.   Cardiovascular: Positive for leg swelling and PND. Negative for chest pain, palpitations, orthopnea and claudication.  Gastrointestinal: Negative for heartburn, nausea, vomiting, abdominal pain, diarrhea, constipation and blood in stool.  Genitourinary: Positive for frequency. Negative for dysuria, urgency and flank pain.  Musculoskeletal:  Positive for myalgias, back pain, joint pain and falls.  Skin: Positive for rash (chronic BLE venous dermatitis. Left medial forefoot diabetic foot ulcer). Negative for itching.       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 Intergluteal cleft erythema and superficial skin missing. Left forefoot diabetic ulcer--chronic about a quarter size. Pressure ulcers at R+L buttocks-in mirror image-2x2cm and superficial. Frontal hematoma about a chicken egg size-resolving.   Neurological: Positive for tremors. Negative for dizziness, tingling, sensory change, speech change, focal weakness, seizures, loss of consciousness, weakness and headaches.  Endo/Heme/Allergies: Negative for environmental allergies and polydipsia. Does not bruise/bleed easily.  Psychiatric/Behavioral: Positive for memory loss. Negative for depression and hallucinations. The patient is nervous/anxious. The patient does not have insomnia.      Past  Medical History  Diagnosis Date  . Diabetes mellitus   . Hypertension   . Heart murmur   . Benign familial tremor   . Rosacea   . Diverticulitis   . Heart failure     diastolic heart failure  . TIA (transient ischemic attack) 2010  . Arthritis   . Breast cancer     breast -rt  . CAD (coronary artery disease)     single vessel CAD per cath in 2006; scattered nonobstructive disease in the left system, left to right collaterals to the RCA which was occluded by flush shots; she has been managed medically   Past Surgical History  Procedure Laterality Date  . Mastectomy partial / lumpectomy  11/02/2000    Right, with SLN  . Mastectomy partial / lumpectomy  05/15/2008    Left  . Breast lumpectomy  01/19/2010    right  . Mastectomy  03/31/2010    Right   Social History:   reports that she has never smoked. She does not have any smokeless tobacco history on file. She reports that she does not drink alcohol or use illicit drugs.    Medications: Reviewed at [facility]   Physical Exam: Physical Exam  Constitutional: She appears well-developed.  HENT:  Head: Normocephalic and atraumatic.  Left Ear: External ear normal.  Eyes: Conjunctivae and EOM are normal. Pupils are equal, round, and reactive to light.  Neck: Neck supple. No JVD present. No thyromegaly present.  Cardiovascular: Normal rate and regular rhythm.   Murmur heard. Pulmonary/Chest: Effort normal.  Abdominal: Soft. There is no tenderness. There is no rebound.  Musculoskeletal: She exhibits edema.  Lymphadenopathy:    She has no cervical adenopathy.  Neurological: She is alert. No cranial nerve deficit. Coordination normal.  Skin: Skin is warm and dry. Rash noted.     Erythematous and chronic swelling in BLE. Left forefoot diabetic ulcer-larger and deeper in size-wound bed is clean with tunneling(priviously protruding center is flattened and is lower than the skin surface.  Intergluteal cleft skin breakdown with superficial  skin missing and erythematous extended to the R+L 1/3 of inner buttocks--new pressure ulcers R+L buttocks near the intergluteal cleft with mirror imaging about a quarter seize stage II presently.    Psychiatric: Her mood appears anxious. Her affect is not blunt, not labile and not inappropriate. Her speech is rapid and/or pressured (occasionally ). Her speech is not delayed and not slurred. She is hyperactive. She is not agitated, not aggressive, not slowed, not withdrawn, not actively hallucinating and not combative. Thought content is not paranoid and not delusional. She does not exhibit a depressed mood. She exhibits abnormal recent memory.    Filed Vitals:   07/09/13 1422  BP: 121/64  Pulse: 68  Temp: 98.8 F (37.1 C)  TempSrc: Tympanic  Resp: 16      Labs reviewed: Basic Metabolic Panel:  Recent Labs  45/40/98 1852 01/24/13  04/09/13 05/27/13 07/01/13  NA 139 139  < > 136* 137 136*  K 4.5 4.6  < > 4.8 4.5 4.2  CL 98  --   --   --   --   --   CO2 29  --   --   --   --   --   GLUCOSE 255*  --   --   --   --   --   BUN 47* 41*  < > 44* 44* 47*  CREATININE 0.94 1.1  < > 1.3* 1.3* 1.1  CALCIUM 9.8  --   --   --   --   --   TSH 1.511 3.68  --   --  3.96  --   < > = values in this interval not displayed. Liver Function Tests:  Recent Labs  11/13/12 1852  AST 21  ALT 19  ALKPHOS 74  BILITOT 0.4  PROT 7.5  ALBUMIN 3.6    CBC:  Recent Labs  11/13/12 1852 12/20/12 04/01/13  WBC 7.8 8.3 7.2  7.2  NEUTROABS 5.8  --   --   HGB 13.6 14.3 14.7  HCT 42.4 42 43  MCV 98.1  --   --   PLT 196 184 220        Assessment/Plan Type II or unspecified type diabetes mellitus with peripheral circulatory disorders, uncontrolled(250.72) Improved, Hgb A1c from 9.8 02/11/13 to 7.7 05/27/13. Takes Lantus 24units qd, Novolog 12 units with lunch and 8 units breakfast/dinner for CBG>100. Am 138-78, lunch 261-110, dinner 281-111. Hs snack is encouraged.       Unspecified  hypothyroidism Corrected with Levothyroxine 112.70mcg, last TSH 3.677 01/24/13, updated [id number] 05/27/13              Ischemic heart disease Still had angina occasionally, f/u Cardiology, continue with Imdur 60mg  and Prn NTG(last dose 02/12/13)    Leg swelling Chronic venous insufficiency and dermatitis. Better with Furosemide 80mg  am and 40mg  pm with  Spironolactone 25mg  bid since 06/27/13--Bun/creat 47/1.12 07/01/13                  Depression Stable on Cymbalta 30mg                Essential tremor Mild head and hands tremor-not disabling. Takes Propranolol 10mg  bid                       Back pain Chronic, pain is controlled with Fentanyl 119mcg/hr and Norco 10/325 q4hr              HTN (hypertension) Controlled on Losartan 100mg  daily, monitor blood pressure daily                    Unspecified constipation Stable on colace 100 x2 qhs and Amitiza bid and MiraLax daily          Family/ Staff Communication: observe the patient.   Goals of Care: SNF  Labs/tests ordered: none

## 2013-07-09 NOTE — Assessment & Plan Note (Signed)
Controlled on Losartan 100mg  daily, monitor blood pressure daily

## 2013-07-20 ENCOUNTER — Emergency Department (HOSPITAL_COMMUNITY)
Admission: EM | Admit: 2013-07-20 | Discharge: 2013-07-20 | Disposition: A | Discharge status: Home / Self Care | Payer: Medicare Other | Attending: Emergency Medicine | Admitting: Emergency Medicine

## 2013-07-20 ENCOUNTER — Encounter (HOSPITAL_COMMUNITY): Payer: Self-pay | Admitting: *Deleted

## 2013-07-20 ENCOUNTER — Emergency Department (HOSPITAL_COMMUNITY): Payer: Medicare Other

## 2013-07-20 DIAGNOSIS — I4891 Unspecified atrial fibrillation: Secondary | ICD-10-CM | POA: Insufficient documentation

## 2013-07-20 DIAGNOSIS — Z8719 Personal history of other diseases of the digestive system: Secondary | ICD-10-CM | POA: Insufficient documentation

## 2013-07-20 DIAGNOSIS — R011 Cardiac murmur, unspecified: Secondary | ICD-10-CM | POA: Insufficient documentation

## 2013-07-20 DIAGNOSIS — Z8674 Personal history of sudden cardiac arrest: Secondary | ICD-10-CM | POA: Insufficient documentation

## 2013-07-20 DIAGNOSIS — M129 Arthropathy, unspecified: Secondary | ICD-10-CM | POA: Insufficient documentation

## 2013-07-20 DIAGNOSIS — Z853 Personal history of malignant neoplasm of breast: Secondary | ICD-10-CM | POA: Insufficient documentation

## 2013-07-20 DIAGNOSIS — R55 Syncope and collapse: Secondary | ICD-10-CM | POA: Insufficient documentation

## 2013-07-20 DIAGNOSIS — Z8673 Personal history of transient ischemic attack (TIA), and cerebral infarction without residual deficits: Secondary | ICD-10-CM | POA: Insufficient documentation

## 2013-07-20 DIAGNOSIS — IMO0002 Reserved for concepts with insufficient information to code with codable children: Secondary | ICD-10-CM | POA: Insufficient documentation

## 2013-07-20 DIAGNOSIS — Z872 Personal history of diseases of the skin and subcutaneous tissue: Secondary | ICD-10-CM | POA: Insufficient documentation

## 2013-07-20 DIAGNOSIS — E119 Type 2 diabetes mellitus without complications: Secondary | ICD-10-CM | POA: Insufficient documentation

## 2013-07-20 DIAGNOSIS — Z8669 Personal history of other diseases of the nervous system and sense organs: Secondary | ICD-10-CM | POA: Insufficient documentation

## 2013-07-20 DIAGNOSIS — Z79899 Other long term (current) drug therapy: Secondary | ICD-10-CM | POA: Insufficient documentation

## 2013-07-20 DIAGNOSIS — Z880 Allergy status to penicillin: Secondary | ICD-10-CM | POA: Insufficient documentation

## 2013-07-20 DIAGNOSIS — I10 Essential (primary) hypertension: Secondary | ICD-10-CM | POA: Insufficient documentation

## 2013-07-20 DIAGNOSIS — Z7982 Long term (current) use of aspirin: Secondary | ICD-10-CM | POA: Insufficient documentation

## 2013-07-20 DIAGNOSIS — I519 Heart disease, unspecified: Secondary | ICD-10-CM

## 2013-07-20 DIAGNOSIS — I2510 Atherosclerotic heart disease of native coronary artery without angina pectoris: Secondary | ICD-10-CM | POA: Insufficient documentation

## 2013-07-20 DIAGNOSIS — Z794 Long term (current) use of insulin: Secondary | ICD-10-CM | POA: Insufficient documentation

## 2013-07-20 DIAGNOSIS — I5189 Other ill-defined heart diseases: Secondary | ICD-10-CM

## 2013-07-20 DIAGNOSIS — R197 Diarrhea, unspecified: Secondary | ICD-10-CM | POA: Insufficient documentation

## 2013-07-20 LAB — COMPREHENSIVE METABOLIC PANEL
ALT: 12 U/L (ref 0–35)
Alkaline Phosphatase: 62 U/L (ref 39–117)
Chloride: 97 mEq/L (ref 96–112)
GFR calc Af Amer: 58 mL/min — ABNORMAL LOW (ref >90)
Glucose, Bld: 147 mg/dL — ABNORMAL HIGH (ref 70–99)
Potassium: 3.9 mEq/L (ref 3.5–5.1)
Sodium: 136 mEq/L (ref 135–145)
Total Bilirubin: 0.3 mg/dL (ref 0.3–1.2)
Total Protein: 7 g/dL (ref 6.0–8.3)

## 2013-07-20 LAB — CBC WITH DIFFERENTIAL/PLATELET
Eosinophils Absolute: 0.4 10*3/uL (ref 0.0–0.7)
Lymphocytes Relative: 16 % (ref 12–46)
Lymphs Abs: 1.5 10*3/uL (ref 0.7–4.0)
MCH: 33.7 pg (ref 26.0–34.0)
Neutro Abs: 6.5 10*3/uL (ref 1.7–7.7)
Neutrophils Relative %: 70 % (ref 43–77)
Platelets: 299 10*3/uL (ref 150–400)
RBC: 4.09 MIL/uL (ref 3.87–5.11)
WBC: 9.3 10*3/uL (ref 4.0–10.5)

## 2013-07-20 LAB — URINALYSIS, ROUTINE W REFLEX MICROSCOPIC
Glucose, UA: NEGATIVE mg/dL
Hgb urine dipstick: NEGATIVE
Protein, ur: NEGATIVE mg/dL
Specific Gravity, Urine: 1.008 (ref 1.005–1.030)

## 2013-07-20 LAB — GLUCOSE, CAPILLARY

## 2013-07-20 MED ORDER — PROPRANOLOL HCL 10 MG PO TABS: 20.0000 mg | ORAL_TABLET | Freq: Two times a day (BID) | ORAL | Status: DC

## 2013-07-20 MED ORDER — METOPROLOL TARTRATE 1 MG/ML IV SOLN
5.0000 mg | Freq: Once | INTRAVENOUS | Status: AC
  Administered 2013-07-20: 5 mg via INTRAVENOUS
  Filled 2013-07-20: qty 5

## 2013-07-20 NOTE — ED Notes (Signed)
The pts  Heart rate has slowed down some.  Frequent urination

## 2013-07-20 NOTE — ED Provider Notes (Signed)
Patient suffered a syncopal event while on the commode this evening. Brought by EMS treated with saline bolus. She is presently asymptomatic on exam no distress HEENT exam normocephalic atraumatic heart tachycardic irregularly irregular lungs clear auscultation  Doug Sou, MD 07/21/13 0126

## 2013-07-20 NOTE — Consult Note (Signed)
Reason for Consult: syncope, AF Referring Physician: Dr Chyrl Civatte is an 79 y.o. female.  HPI:  Ms Werntz is an elderly female with multiple medical problems. She presents today with an episode of syncope after standing up from the toilet. She says that she has been having a alternating episodes of diarrhea and constipation. The patient recalls standing up from the toilet and then bending over at the waist. She next remembers being on the floor. The patient doesn't recall any preceding chest pain, shortness of breath, lightheadedness or heart palpitations. She does have a history of atrial fibrillation that is rate controlled. She is not on systemic anticoagulation at this time because of seizure disorder and her age. She says that she has been busy today because a friend of theirs died after a long illness. They have been doing errands for their church and been busier than usual. She has taken her medications as ordered. She has long-standing lower extremity swelling that waxes and wanes in severity. She thinks that it is currently at its baseline. Her lower extremities are often wrapped by her ALF staff. She denies any shortness of breath or chest pains with exertion or at rest. She does have known CAD that has been medically managed. She has a known RCA occlusion that is well collateralized. She also has diastolic dysfunction. She tells me that her AF is not a big problem for her, and that she doesn't have problems with racing heart or her heart skipping beats.    Past Medical History  Diagnosis Date  . Diabetes mellitus   . Hypertension   . Heart murmur   . Benign familial tremor   . Rosacea   . Diverticulitis   . Heart failure     diastolic heart failure  . TIA (transient ischemic attack) 2010  . Arthritis   . Breast cancer     breast -rt  . CAD (coronary artery disease)     single vessel CAD per cath in 2006; scattered nonobstructive disease in the left system, left to right  collaterals to the RCA which was occluded by flush shots; she has been managed medically    Past Surgical History  Procedure Laterality Date  . Mastectomy partial / lumpectomy  11/02/2000    Right, with SLN  . Mastectomy partial / lumpectomy  05/15/2008    Left  . Breast lumpectomy  01/19/2010    right  . Mastectomy  03/31/2010    Right    No family history on file.  Social History:  reports that she has never smoked. She does not have any smokeless tobacco history on file. She reports that she does not drink alcohol or use illicit drugs.  Allergies:  Allergies  Allergen Reactions  . Penicillins Other (See Comments)    Per MAR  . Bee Venom Other (See Comments)    Per MAR  . Celebrex [Celecoxib] Other (See Comments)    Per MAR  . Lidocaine Hcl Other (See Comments)    Per MAR  . Phenobarbital Other (See Comments)    Per MAR  . Procaine Hcl Other (See Comments)    Per mar    Medications: I have reviewed the patient's current medications.  Results for orders placed during the hospital encounter of 07/20/13 (from the past 48 hour(s))  CBC WITH DIFFERENTIAL     Status: None   Collection Time    07/20/13  7:21 PM      Result Value Range  WBC 9.3  4.0 - 10.5 K/uL   RBC 4.09  3.87 - 5.11 MIL/uL   Hemoglobin 13.8  12.0 - 15.0 g/dL   HCT 16.1  09.6 - 04.5 %   MCV 97.1  78.0 - 100.0 fL   MCH 33.7  26.0 - 34.0 pg   MCHC 34.8  30.0 - 36.0 g/dL   RDW 40.9  81.1 - 91.4 %   Platelets 299  150 - 400 K/uL   Neutrophils Relative % 70  43 - 77 %   Neutro Abs 6.5  1.7 - 7.7 K/uL   Lymphocytes Relative 16  12 - 46 %   Lymphs Abs 1.5  0.7 - 4.0 K/uL   Monocytes Relative 10  3 - 12 %   Monocytes Absolute 0.9  0.1 - 1.0 K/uL   Eosinophils Relative 4  0 - 5 %   Eosinophils Absolute 0.4  0.0 - 0.7 K/uL   Basophils Relative 1  0 - 1 %   Basophils Absolute 0.1  0.0 - 0.1 K/uL  COMPREHENSIVE METABOLIC PANEL     Status: Abnormal   Collection Time    07/20/13  7:21 PM      Result Value  Range   Sodium 136  135 - 145 mEq/L   Potassium 3.9  3.5 - 5.1 mEq/L   Chloride 97  96 - 112 mEq/L   CO2 30  19 - 32 mEq/L   Glucose, Bld 147 (*) 70 - 99 mg/dL   BUN 46 (*) 6 - 23 mg/dL   Creatinine, Ser 7.82  0.50 - 1.10 mg/dL   Calcium 9.3  8.4 - 95.6 mg/dL   Total Protein 7.0  6.0 - 8.3 g/dL   Albumin 2.8 (*) 3.5 - 5.2 g/dL   AST 19  0 - 37 U/L   ALT 12  0 - 35 U/L   Alkaline Phosphatase 62  39 - 117 U/L   Total Bilirubin 0.3  0.3 - 1.2 mg/dL   GFR calc non Af Amer 50 (*) >90 mL/min   GFR calc Af Amer 58 (*) >90 mL/min   Comment: (NOTE)     The eGFR has been calculated using the CKD EPI equation.     This calculation has not been validated in all clinical situations.     eGFR's persistently <90 mL/min signify possible Chronic Kidney     Disease.  TROPONIN I     Status: None   Collection Time    07/20/13  7:40 PM      Result Value Range   Troponin I <0.30  <0.30 ng/mL   Comment:            Due to the release kinetics of cTnI,     a negative result within the first hours     of the onset of symptoms does not rule out     myocardial infarction with certainty.     If myocardial infarction is still suspected,     repeat the test at appropriate intervals.  PRO B NATRIURETIC PEPTIDE     Status: Abnormal   Collection Time    07/20/13  7:40 PM      Result Value Range   Pro B Natriuretic peptide (BNP) 1694.0 (*) 0 - 450 pg/mL  URINALYSIS, ROUTINE W REFLEX MICROSCOPIC     Status: None   Collection Time    07/20/13  7:51 PM      Result Value Range   Color, Urine YELLOW  YELLOW   APPearance CLEAR  CLEAR   Specific Gravity, Urine 1.008  1.005 - 1.030   pH 6.0  5.0 - 8.0   Glucose, UA NEGATIVE  NEGATIVE mg/dL   Hgb urine dipstick NEGATIVE  NEGATIVE   Bilirubin Urine NEGATIVE  NEGATIVE   Ketones, ur NEGATIVE  NEGATIVE mg/dL   Protein, ur NEGATIVE  NEGATIVE mg/dL   Urobilinogen, UA 0.2  0.0 - 1.0 mg/dL   Nitrite NEGATIVE  NEGATIVE   Leukocytes, UA NEGATIVE  NEGATIVE    Comment: MICROSCOPIC NOT DONE ON URINES WITH NEGATIVE PROTEIN, BLOOD, LEUKOCYTES, NITRITE, OR GLUCOSE <1000 mg/dL.  GLUCOSE, CAPILLARY     Status: Abnormal   Collection Time    07/20/13  8:28 PM      Result Value Range   Glucose-Capillary 113 (*) 70 - 99 mg/dL   Comment 1 Notify RN      Dg Chest Port 1 View  07/20/2013   *RADIOLOGY REPORT*  Clinical Data: Syncope and diarrhea.  PORTABLE CHEST - 1 VIEW  Comparison: 11/13/2012  Findings: Cardiac enlargement with normal pulmonary vascularity. Linear atelectasis or fibrosis in the lung bases.  No focal consolidation or airspace disease.  No blunting of costophrenic angles.  No pneumothorax.  Mediastinal contours appear intact. Calcification of the aorta.  Degenerative changes in the spine and shoulders.  No significant change since previous study.  IMPRESSION: Cardiac enlargement.  Linear atelectasis or fibrosis in the lung bases.  No evidence of active disease.   Original Report Authenticated By: Burman Nieves, M.D.    Review of Systems  Constitutional: Negative.   HENT: Negative.   Eyes: Negative.   Respiratory: Negative.   Cardiovascular: Negative.   Gastrointestinal: Negative.   Genitourinary: Negative.   Musculoskeletal: Negative.   Skin: Negative.   Neurological: Positive for loss of consciousness.  Endo/Heme/Allergies: Negative.   Psychiatric/Behavioral: Negative.    Blood pressure 130/103, pulse 100, temperature 97.8 F (36.6 C), temperature source Oral, resp. rate 20, SpO2 100.00%. Physical Exam  Nursing note and vitals reviewed. Constitutional: She is oriented to person, place, and time. No distress.  Neck: No JVD present.  Cardiovascular: S1 normal and S2 normal.  An irregular rhythm present.  Extrasystoles are present.  Pulses:      Dorsalis pedis pulses are 1+ on the right side, and 1+ on the left side.       Posterior tibial pulses are 1+ on the right side, and 1+ on the left side.  Respiratory: Effort normal and  breath sounds normal. She has no wheezes. She has no rales.  GI: Soft. Bowel sounds are normal.  Musculoskeletal: She exhibits edema.  Neurological: She is alert and oriented to person, place, and time.  Skin: She is not diaphoretic.    Assessment/Plan: Ms Hamler is a pleasant elderly female with multiple medical problems. Today she had an episode of syncope which sounds to be a vasovagal event. In the ED she has been in atrial fibrillation with rates 100-120. I discussed with Ms Mcgough that we could have her come into the hospital to titrate her rate control medications in a monitored setting. She could also be developing sinus node dysfunction. She is not interested in being admitted. I discussed with her the risk of repeat syncope. She feels that what happened today was an isolated event. She is not on systemic anticoagulation, so the risk of bleeding is lower than it could be. Her labs are unremarkable except for an elevated BNP. She doesn't have signs  of decompensated heart failure on exam. Her lower extremity swelling seems to be at its baseline. In terms of her elevated heart rate, it is reasonable to increase her propranolol to 20mg  BID to help with rate control. I will not increase her lasix dose. I've asked her to try and check her pulse twice per day for the next couple days and give [place] a call if her heart rate is less than 60bpm. I discussed with her and her husband what to look for in terms of when to call us back.    Syncope- likely vasovagal  AF- will increase her propranolol to 20mg  BID  Diastolic heart failure- appears euvolemic on exam.    Robyne Peers, MD   Anne Ng C 07/20/2013, 10:11 PM

## 2013-07-20 NOTE — ED Notes (Signed)
The pt is asking for food unable to give at present

## 2013-07-20 NOTE — ED Notes (Signed)
The pt arrived  By [facility] from [facility].  Pt not feeling well since 1200 today.  She was found to be tachycardic and  Hypotensive.  She has not been drinking water all day and the paramedic reported that   The pts room was very hot .  She was given nss and her heart rate slowed and her bp came up with the fluids.  Alert she has a generalized body rash and she has  waht appears to be cellulitis in her rt lower leg and she has a bandage on her lt lower leg.  Her genitalia is  Very irritated.  She wears a diaper.  Her husband is at the bedside and  Lives at the nursing home also

## 2013-07-20 NOTE — ED Notes (Signed)
Report called to the supervisor at [facility].  Also the son was called in [place] as he requested to give the pts  Disposition.   nobie alleyne [phone]

## 2013-07-20 NOTE — ED Notes (Signed)
The pts heart rate has slowed and her bp has decreased

## 2013-07-20 NOTE — ED Provider Notes (Signed)
CSN: [id number]     Arrival date & time 07/20/13  1856 History   First MD Initiated Contact with Patient 07/20/13 1859     Chief Complaint  Patient presents with  . Tachycardia   (Consider location/radiation/quality/duration/timing/severity/associated sxs/prior Treatment) HPI 79 year old female past history of atrial fibrillation on Coumadin, coronary artery disease, hypertension and diabetes  presents after transfer from her nursing facility by EMS for further evaluation due to tachycardia and a syncopal event. Patient reports that she has had loose bowel movements for the past 2 days. She reportedly passed out while trying to stand up from the toilet today. Her husband at the bedside reported she had several more of these episodes with standing.  The patient denies having any symptoms beforehand. There was no seizure-like activity. She denies any chest pain or shortness of breath currently. She also denies recent headache, fevers, abdominal pain, vomiting, nausea, urinary frequency and dysuria. EMS started an IV, administered fluids and transported her to the emergency department  Past Medical History  Diagnosis Date  . Diabetes mellitus   . Hypertension   . Heart murmur   . Benign familial tremor   . Rosacea   . Diverticulitis   . Heart failure     diastolic heart failure  . TIA (transient ischemic attack) 2010  . Arthritis   . Breast cancer     breast -rt  . CAD (coronary artery disease)     single vessel CAD per cath in 2006; scattered nonobstructive disease in the left system, left to right collaterals to the RCA which was occluded by flush shots; she has been managed medically   Past Surgical History  Procedure Laterality Date  . Mastectomy partial / lumpectomy  11/02/2000    Right, with SLN  . Mastectomy partial / lumpectomy  05/15/2008    Left  . Breast lumpectomy  01/19/2010    right  . Mastectomy  03/31/2010    Right   No family history on file. History  Substance Use  Topics  . Smoking status: Never Smoker   . Smokeless tobacco: Not on file  . Alcohol Use: No   OB History   Grav Para Term Preterm Abortions TAB SAB Ect Mult Living                 Review of Systems  Constitutional: Negative for fever and chills.  HENT: Negative for neck pain and neck stiffness.   Respiratory: Negative for choking, chest tightness and shortness of breath.   Cardiovascular: Positive for palpitations. Negative for chest pain.  Gastrointestinal: Positive for diarrhea. Negative for nausea, vomiting and blood in stool.  Genitourinary: Negative for dysuria, frequency and vaginal bleeding.  Musculoskeletal: Negative for back pain.  Skin: Negative for rash.  Neurological: Negative for dizziness, weakness, numbness and headaches.  All other systems reviewed and are negative.    Allergies  Penicillins; Bee venom; Celebrex; Lidocaine hcl; Phenobarbital; and Procaine hcl  Home Medications   Current Outpatient Rx  Name  Route  Sig  Dispense  Refill  . Aloe-Sodium Chloride (AYR SALINE NASAL GEL NA)   Nasal   Place 1 application into the nose 2 (two) times daily. Both nostrils         . alum & mag hydroxide-simeth (GERI-LANTA) 200-200-20 MG/5ML suspension   Oral   Take 30 mLs by mouth every 6 (six) hours as needed. For upset stomach         . alum & mag hydroxide-simeth (MINTOX) 200-200-20 MG/5ML  suspension   Topical   Apply 15 mLs topically 2 (two) times daily. Apply to buttocks         . aspirin 81 MG chewable tablet   Oral   Chew 81 mg by mouth every morning.          . clotrimazole (LOTRIMIN) 1 % cream   Topical   Apply 1 application topically 2 (two) times daily. To groin/buttocks         . docusate sodium (COLACE) 100 MG capsule   Oral   Take 200 mg by mouth at bedtime.          . DULoxetine (CYMBALTA) 30 MG capsule   Oral   Take 30 mg by mouth every morning.          . fentaNYL (DURAGESIC - DOSED MCG/HR) 100 MCG/HR      Apply 1 patch  every 3 hours. Change site   10 patch   0   . furosemide (LASIX) 20 MG tablet   Oral   Take 60 mg by mouth daily.         . furosemide (LASIX) 40 MG tablet   Oral   Take 40 mg by mouth daily.          [name] glucosamine-chondroitin 500-400 MG tablet   Oral   Take 1 tablet by mouth 3 (three) times daily.           [name] guaifenesin (ROBAFEN) 100 MG/5ML syrup   Oral   Take 300 mg by mouth 3 (three) times daily as needed for cough.         [name] HYDROcodone-acetaminophen (NORCO) 10-325 MG per tablet   Oral   Take 0.5 tablets by mouth every 4 (four) hours as needed. For pain         . hydrOXYzine (ATARAX/VISTARIL) 10 MG tablet   Oral   Take 10 mg by mouth 3 (three) times daily as needed. For itching         . hydrOXYzine (ATARAX/VISTARIL) 25 MG tablet   Oral   Take 25 mg by mouth at bedtime as needed. For itching         . insulin aspart (NOVOLOG) 100 UNIT/ML injection   Subcutaneous   Inject 8 Units into the skin 2 (two) times daily. If CBG is greater than 100         . insulin glargine (LANTUS) 100 UNIT/ML injection   Subcutaneous   Inject 24 Units into the skin at bedtime.          . isosorbide mononitrate (IMDUR) 60 MG 24 hr tablet   Oral   Take 60 mg by mouth every morning.          [name] levothyroxine (SYNTHROID, LEVOTHROID) 75 MCG tablet   Oral   Take 75-112.5 mcg by mouth daily. Take 1.5 tablets (112.60mcg) on Saturday. Take 1 tablet (75 mcg) daily all other days of the week         . loratadine (CLARITIN) 10 MG tablet   Oral   Take 10 mg by mouth as needed for allergies.         [name] LORazepam (ATIVAN) 0.5 MG tablet   Oral   Take 1 mg by mouth every 12 (twelve) hours as needed. Severe agitation         . losartan (COZAAR) 100 MG tablet   Oral   Take 100 mg by mouth every morning.          [name]  lubiprostone (AMITIZA) 24 MCG capsule   Oral   Take 24 mcg by mouth 2 (two) times daily with a meal.           . Multiple Vitamins-Minerals  (CERTA-VITE-LUTEIN PO)   Oral   Take 1 tablet by mouth daily.          . nitroGLYCERIN (NITROSTAT) 0.4 MG SL tablet   Sublingual   Place 0.4 mg under the tongue every 5 (five) minutes as needed. For chest pain         . Nutritional Supplements (RESOURCE ARGINAID PO)   Oral   Take 1 packet by mouth 2 (two) times daily.         [name] nystatin cream (MYCOSTATIN)   Topical   Apply 1 application topically 2 (two) times daily. To rash on abdomen, back and thighs         . nystatin-triamcinolone (MYCOLOG II) cream   Topical   Apply 1 application topically 2 (two) times daily. To reddened and scaly area         . omeprazole (PRILOSEC) 20 MG capsule   Oral   Take 20 mg by mouth 2 (two) times daily.         [name] oxymetazoline (AFRIN) 0.05 % nasal spray   Nasal   Place 2 sprays into the nose 2 (two) times daily as needed. For congestion         . polyethylene glycol (MIRALAX / GLYCOLAX) packet   Oral   Take 17 g by mouth daily.         . predniSONE (DELTASONE) 10 MG tablet   Oral   Take 10 mg by mouth every morning.          . Probiotic Product (ALIGN) 4 MG CAPS   Oral   Take 1 capsule by mouth daily.         . propranolol (INDERAL) 10 MG tablet   Oral   Take 10 mg by mouth 2 (two) times daily.          . sodium chloride (OCEAN) 0.65 % nasal spray   Nasal   Place 1 spray into the nose as needed. For dry nose         . spironolactone (ALDACTONE) 25 MG tablet   Oral   Take 25 mg by mouth 2 (two) times daily.         [name] sulfamethoxazole-trimethoprim (BACTRIM DS) 800-160 MG per tablet   Oral   Take 1 tablet by mouth 2 (two) times daily.   42 tablet   0   . Vitamin D, Ergocalciferol, (DRISDOL) 50000 UNITS CAPS   Oral   Take 50,000 Units by mouth every 30 (thirty) days. On the first of the month          BP 145/53  Pulse 107  Temp(Src) 97.8 F (36.6 C) (Oral)  Resp 18  SpO2 100% Physical Exam  Vitals reviewed. Constitutional: She is oriented  to person, place, and time. She appears well-developed and well-nourished. No distress.  HENT:  Right Ear: External ear normal.  Left Ear: External ear normal.  Mouth/Throat: No oropharyngeal exudate.  Eyes: Conjunctivae and EOM are normal. Pupils are equal, round, and reactive to light.  Neck: Normal range of motion. Neck supple.  Cardiovascular: Normal heart sounds and intact distal pulses.  Exam reveals no gallop and no friction rub.   No murmur heard. Tachycardic, irregular rhythm  Pulmonary/Chest: Effort normal and breath sounds normal. No respiratory  distress. She has no rales.  Abdominal: Soft. Bowel sounds are normal. She exhibits no distension. There is no tenderness.  Musculoskeletal: Normal range of motion. She exhibits no edema.  Neurological: She is alert and oriented to person, place, and time. No cranial nerve deficit.  Skin: Skin is warm and dry. No rash noted. She is not diaphoretic.  Psychiatric: She has a normal mood and affect.    ED Course  Procedures (including critical care time) Labs Review Labs Reviewed  COMPREHENSIVE METABOLIC PANEL - Abnormal; Notable for the following:    Glucose, Bld 147 (*)    BUN 46 (*)    Albumin 2.8 (*)    GFR calc non Af Amer 50 (*)    GFR calc Af Amer 58 (*)    All other components within normal limits  PRO B NATRIURETIC PEPTIDE - Abnormal; Notable for the following:    Pro B Natriuretic peptide (BNP) 1694.0 (*)    All other components within normal limits  GLUCOSE, CAPILLARY - Abnormal; Notable for the following:    Glucose-Capillary 113 (*)    All other components within normal limits  CBC WITH DIFFERENTIAL  TROPONIN I  URINALYSIS, ROUTINE W REFLEX MICROSCOPIC   Imaging Review Dg Chest Port 1 View  07/20/2013   *RADIOLOGY REPORT*  Clinical Data: Syncope and diarrhea.  PORTABLE CHEST - 1 VIEW  Comparison: 11/13/2012  Findings: Cardiac enlargement with normal pulmonary vascularity. Linear atelectasis or fibrosis in the lung  bases.  No focal consolidation or airspace disease.  No blunting of costophrenic angles.  No pneumothorax.  Mediastinal contours appear intact. Calcification of the aorta.  Degenerative changes in the spine and shoulders.  No significant change since previous study.  IMPRESSION: Cardiac enlargement.  Linear atelectasis or fibrosis in the lung bases.  No evidence of active disease.   Original Report Authenticated By: Burman Nieves, M.D.    Date: 07/21/2013  Rate: 128  Rhythm: atrial fibrillation  QRS Axis: left  Intervals: QT prolonged  ST/T Wave abnormalities: nonspecific ST changes  Conduction Disutrbances:none  Narrative Interpretation: A. fib with RVR, nonspecific ST changes, she has elevation of the J point in her inferior and lateral leads that is similar to prior EKG  Old EKG Reviewed: unchanged from EKG 11/13/12    MDM   79 year old female with multiple comorbidities here after a syncopal event.  She reports loose bowel movements for the past 2 days. She is tachycardic and her EKG consistent with A. fib with RVR. Lungs are clear. She has bilateral lower extremity edema, but this appears to be chronic issue. She reports that her swelling is no worse than normal. I will evaluate the patient with CBC, CMP, troponin, BNP urine studies, chest x-ray and plan for admission. 5 mg metoprolol IV for tachycardia.  9:00 PM Patient reports feeling better. She's been up in bed eating a sandwich.  Heart rate has improved to the 80s.  She is asymptomatic currently. Given her syncopal event it is felt that she warrants admission for monitoring. Cardiology has been consulted.  10:45 PM Cardiology has evaluated the patient and feel that she is stable for discharge back to her facility. This is mostly due to the fact that patient does not desire admission. This is in keeping with her goals of care. She does have a MOST form with her and it is consistent with these wishes. She is of sound decision making  capacity and her husband is in agreement. The cardiologist recommended that she  increase her Inderal to 20 mg twice a day her and to follow up with him closely in clinic. Patient was agreeable with this plan. Return precautions were reviewed.  Clinical Impression: 1. Syncope   2. Atrial fibrillation   3. Diastolic dysfunction   4. Atrial fibrillation with RVR     Disposition: Discharge  Condition: Good  I have discussed the results, Dx and Tx plan. They expressed understanding and agree with the plan and were told to return to ED with any worsening of condition or concern.    Discharge Medication List as of 07/20/2013 10:39 PM      Follow Up: Julian Hy, MD [address] [place] [address] [phone]  Schedule an appointment as soon as possible for a visit   Cassell Clement, MD [address] [place] [address] [phone]  Schedule an appointment as soon as possible for a visit    Pt seen in conjunction with Dr. Ethelda Chick.  Reine Just. Beverely Pace, MD Emergency Medicine PGY-III [phone]     Oleh Genin, MD 07/21/13 [phone]

## 2013-07-21 NOTE — ED Provider Notes (Signed)
I have personally seen and examined the patient.  I have discussed the plan of care with the resident.  I have reviewed the documentation on PMH/FH/Soc. History.  I have reviewed the documentation of the resident and agree.  Doug Sou, MD 07/21/13 [phone]

## 2013-07-23 ENCOUNTER — Non-Acute Institutional Stay (SKILLED_NURSING_FACILITY): Payer: Medicare Other | Admitting: Nurse Practitioner

## 2013-07-23 ENCOUNTER — Encounter: Payer: Self-pay | Admitting: Nurse Practitioner

## 2013-07-23 DIAGNOSIS — K5900 Constipation, unspecified: Secondary | ICD-10-CM

## 2013-07-23 DIAGNOSIS — F329 Major depressive disorder, single episode, unspecified: Secondary | ICD-10-CM

## 2013-07-23 DIAGNOSIS — E1159 Type 2 diabetes mellitus with other circulatory complications: Secondary | ICD-10-CM

## 2013-07-23 DIAGNOSIS — I10 Essential (primary) hypertension: Secondary | ICD-10-CM

## 2013-07-23 DIAGNOSIS — M549 Dorsalgia, unspecified: Secondary | ICD-10-CM

## 2013-07-23 DIAGNOSIS — G250 Essential tremor: Secondary | ICD-10-CM

## 2013-07-23 DIAGNOSIS — I4891 Unspecified atrial fibrillation: Secondary | ICD-10-CM

## 2013-07-23 DIAGNOSIS — E039 Hypothyroidism, unspecified: Secondary | ICD-10-CM

## 2013-07-23 DIAGNOSIS — M7989 Other specified soft tissue disorders: Secondary | ICD-10-CM

## 2013-07-23 NOTE — Assessment & Plan Note (Signed)
Chronic, pain is controlled with Fentanyl 100mcg/hr and Norco 10/325 q4hr                                         

## 2013-07-23 NOTE — Assessment & Plan Note (Signed)
Stable on colace 100 x2 qhs and Amitiza 24mcg bid and MiraLax daily             

## 2013-07-23 NOTE — Assessment & Plan Note (Signed)
Improved, Hgb A1c from 9.8 02/11/13 to 7.7 05/27/13. Takes Lantus 24units qd, Novolog 12 units with lunch and 8 units breakfast/dinner for CBG>100. Am 138-78, lunch 261-110, dinner 281-111. Hs snack is encouraged.      

## 2013-07-23 NOTE — Progress Notes (Signed)
Patient ID: [name], female   DOB: [birth year], 79 y.o.   MRN: [id number] Code Status: DNR  Allergies  Allergen Reactions  . Penicillins Other (See Comments)    Per MAR  . Bee Venom Other (See Comments)    Per MAR  . Celebrex [Celecoxib] Other (See Comments)    Per MAR  . Lidocaine Hcl Other (See Comments)    Per MAR  . Phenobarbital Other (See Comments)    Per MAR  . Procaine Hcl Other (See Comments)    Per mar    Chief Complaint  Patient presents with  . Medical Managment of Chronic Issues    ED evaluation of Syncope 07/20/13   HPI: Patient is a 79 y.o. female seen in the SNF at [facility] today for evaluation of s/p ED evaluation for syncope episode 07/20/13. She presented to ED with an episode of syncope after standing up from the toilet.The patient did not recall any preceding chest pain, shortness of breath, lightheadedness or heart palpitations. She does have a history of atrial fibrillation that is rate controlled. She has long-standing lower extremity swelling that waxes and wanes in severity. She does have known CAD that has been medically managed. She has a known RCA occlusion that is well collateralized. She also has diastolic dysfunction. Dr. Verlin Dike Cardiology: Syncope- likely vasovagal, AF-increased her propranolol to 20mg  BID, Diastolic heart failure- appears euvolemic on exam.   Problem List Items Addressed This Visit   A-fib     Rate controlled. Propranolol was increased to 20mg  bid for better rate control in setting of HTN and essential tremor.     Back pain     Chronic, pain is controlled with Fentanyl 147mcg/hr and Norco 10/325 q4hr                  Depression     Stable on Cymbalta 30mg                    Essential tremor     Mild head and hands tremor-not disabling. Takes Propranolol 20mg  bid since 07/20/13-no change.                            HTN (hypertension) - Primary     Controlled on Losartan 100mg   daily, monitor blood pressure daily                        Leg swelling     Chronic venous insufficiency and dermatitis. Better with Furosemide 80mg  am and 40mg  pm with  Spironolactone 25mg  bid since 06/27/13--Bun/creat 47/1.12 07/01/13                      Type II or unspecified type diabetes mellitus with peripheral circulatory disorders, uncontrolled(250.72) (Chronic)     Improved, Hgb A1c from 9.8 02/11/13 to 7.7 05/27/13. Takes Lantus 24units qd, Novolog 12 units with lunch and 8 units breakfast/dinner for CBG>100. Am 138-78, lunch 261-110, dinner 281-111. Hs snack is encouraged.           Unspecified constipation     Stable on colace 100 x2 qhs and Amitiza bid and MiraLax daily            Unspecified hypothyroidism (Chronic)     Corrected with Levothyroxine 112.26mcg, last TSH 3.677 01/24/13, updated WUJ8.119 05/27/13  Review of Systems:  Review of Systems  Constitutional: Negative for fever, chills, weight loss, malaise/fatigue and diaphoresis.  HENT: Positive for hearing loss. Negative for congestion, sore throat, neck pain and ear discharge.   Eyes: Negative for pain, discharge and redness.  Respiratory: Negative for cough, sputum production, shortness of breath and wheezing.   Cardiovascular: Positive for leg swelling and PND. Negative for chest pain, palpitations, orthopnea and claudication.  Gastrointestinal: Negative for heartburn, nausea, vomiting, abdominal pain, diarrhea, constipation and blood in stool.  Genitourinary: Positive for frequency. Negative for dysuria, urgency and flank pain.  Musculoskeletal: Positive for myalgias, back pain, joint pain and falls.  Skin: Positive for rash (chronic BLE venous dermatitis. Left medial forefoot diabetic foot ulcer). Negative for itching.       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 Intergluteal cleft erythema and superficial skin missing. Left forefoot  diabetic ulcer--chronic about a quarter size. Pressure ulcers at R+L buttocks-in mirror image-2x2cm and superficial. Frontal hematoma about a chicken egg size-resolving.   Neurological: Positive for tremors. Negative for dizziness, tingling, sensory change, speech change, focal weakness, seizures, loss of consciousness, weakness and headaches.  Endo/Heme/Allergies: Negative for environmental allergies and polydipsia. Does not bruise/bleed easily.  Psychiatric/Behavioral: Positive for memory loss. Negative for depression and hallucinations. The patient is nervous/anxious. The patient does not have insomnia.      Past Medical History  Diagnosis Date  . Diabetes mellitus   . Hypertension   . Heart murmur   . Benign familial tremor   . Rosacea   . Diverticulitis   . Heart failure     diastolic heart failure  . TIA (transient ischemic attack) 2010  . Arthritis   . Breast cancer     breast -rt  . CAD (coronary artery disease)     single vessel CAD per cath in 2006; scattered nonobstructive disease in the left system, left to right collaterals to the RCA which was occluded by flush shots; she has been managed medically   Past Surgical History  Procedure Laterality Date  . Mastectomy partial / lumpectomy  11/02/2000    Right, with SLN  . Mastectomy partial / lumpectomy  05/15/2008    Left  . Breast lumpectomy  01/19/2010    right  . Mastectomy  03/31/2010    Right   Social History:   reports that she has never smoked. She does not have any smokeless tobacco history on file. She reports that she does not drink alcohol or use illicit drugs.    Medications: Reviewed at [facility]   Physical Exam: Physical Exam  Constitutional: She appears well-developed.  HENT:  Head: Normocephalic and atraumatic.  Left Ear: External ear normal.  Eyes: Conjunctivae and EOM are normal. Pupils are equal, round, and reactive to light.  Neck: Neck supple. No JVD present. No thyromegaly present.    Cardiovascular: Normal rate and regular rhythm.   Murmur heard. Pulmonary/Chest: Effort normal.  Abdominal: Soft. There is no tenderness. There is no rebound.  Musculoskeletal: She exhibits edema.  Lymphadenopathy:    She has no cervical adenopathy.  Neurological: She is alert. No cranial nerve deficit. Coordination normal.  Skin: Skin is warm and dry. Rash noted.     Erythematous and chronic swelling in BLE. Left forefoot diabetic ulcer-larger and deeper in size-wound bed is clean with tunneling(priviously protruding center is flattened and is lower than the skin surface.  Intergluteal cleft skin breakdown with superficial skin missing and erythematous extended to the R+L 1/3 of inner buttocks--new pressure  ulcers R+L buttocks near the intergluteal cleft with mirror imaging about a quarter seize stage II presently.    Psychiatric: Her mood appears anxious. Her affect is not blunt, not labile and not inappropriate. Her speech is rapid and/or pressured (occasionally ). Her speech is not delayed and not slurred. She is hyperactive. She is not agitated, not aggressive, not slowed, not withdrawn, not actively hallucinating and not combative. Thought content is not paranoid and not delusional. She does not exhibit a depressed mood. She exhibits abnormal recent memory.    Filed Vitals:   07/23/13 1336  BP: 130/80  Pulse: 72  Temp: 98.1 F (36.7 C)  TempSrc: Tympanic  Resp: 16      Labs reviewed: Basic Metabolic Panel:  Recent Labs  81/19/14 1852 01/24/13  05/27/13 07/01/13 07/20/13 1921  NA 139 139  < > 137 136* 136  K 4.5 4.6  < > 4.5 4.2 3.9  CL 98  --   --   --   --  97  CO2 29  --   --   --   --  30  GLUCOSE 255*  --   --   --   --  147*  BUN 47* 41*  < > 44* 47* 46*  CREATININE 0.94 1.1  < > 1.3* 1.1 1.00  CALCIUM 9.8  --   --   --   --  9.3  TSH 1.511 3.68  --  3.96  --   --   < > = values in this interval not displayed. Liver Function Tests:  Recent Labs   11/13/12 1852 07/20/13 1921  AST 21 19  ALT 19 12  ALKPHOS 74 62  BILITOT 0.4 0.3  PROT 7.5 7.0  ALBUMIN 3.6 2.8*    CBC:  Recent Labs  11/13/12 1852 12/20/12 04/01/13 07/20/13 1921  WBC 7.8 8.3 7.2  7.2 9.3  NEUTROABS 5.8  --   --  6.5  HGB 13.6 14.3 14.7 13.8  HCT 42.4 42 43 39.7  MCV 98.1  --   --  97.1  PLT 196 184 220 299   Past procedures:  07/20/2013 *RADIOLOGY REPORT* Clinical Data: Syncope and diarrhea. PORTABLE CHEST - 1 VIEW Comparison: 11/13/2012 IMPRESSION: Cardiac enlargement. Linear atelectasis or fibrosis in the lung bases. No evidence of active disease.   Assessment/Plan HTN (hypertension) Controlled on Losartan 100mg  daily, monitor blood pressure daily                      A-fib Rate controlled. Propranolol was increased to 20mg  bid for better rate control in setting of HTN and essential tremor.   Back pain Chronic, pain is controlled with Fentanyl 166mcg/hr and Norco 10/325 q4hr                Depression Stable on Cymbalta 30mg                  Essential tremor Mild head and hands tremor-not disabling. Takes Propranolol 20mg  bid since 07/20/13-no change.                          Leg swelling Chronic venous insufficiency and dermatitis. Better with Furosemide 80mg  am and 40mg  pm with  Spironolactone 25mg  bid since 06/27/13--Bun/creat 47/1.12 07/01/13                    Type II or unspecified type diabetes mellitus with peripheral circulatory disorders, uncontrolled(250.72)  Improved, Hgb A1c from 9.8 02/11/13 to 7.7 05/27/13. Takes Lantus 24units qd, Novolog 12 units with lunch and 8 units breakfast/dinner for CBG>100. Am 138-78, lunch 261-110, dinner 281-111. Hs snack is encouraged.         Unspecified constipation Stable on colace 100 x2 qhs and Amitiza bid and MiraLax daily          Unspecified hypothyroidism Corrected with Levothyroxine  112.51mcg, last TSH 3.677 01/24/13, updated LKG4.010 05/27/13                  Family/ Staff Communication: observe the patient.   Goals of Care: SNF  Labs/tests ordered: none

## 2013-07-23 NOTE — Assessment & Plan Note (Signed)
Stable on Cymbalta 30mg  

## 2013-07-23 NOTE — Assessment & Plan Note (Signed)
Chronic venous insufficiency and dermatitis. Better with Furosemide 80mg  am and 40mg  pm with  Spironolactone 25mg  bid since 06/27/13--Bun/creat 47/1.12 07/01/13

## 2013-07-23 NOTE — Assessment & Plan Note (Signed)
Controlled on Losartan 100mg daily, monitor blood pressure daily                   

## 2013-07-23 NOTE — Assessment & Plan Note (Signed)
Mild head and hands tremor-not disabling. Takes Propranolol 20mg  bid since 07/20/13-no change.

## 2013-07-23 NOTE — Assessment & Plan Note (Signed)
Rate controlled. Propranolol was increased to 20mg bid for better rate control in setting of HTN and essential tremor.                                

## 2013-07-23 NOTE — Assessment & Plan Note (Signed)
Corrected with Levothyroxine 112.5mcg, last TSH 3.677 01/24/13, updated TSH3.965 05/27/13                   

## 2013-07-26 ENCOUNTER — Ambulatory Visit (INDEPENDENT_AMBULATORY_CARE_PROVIDER_SITE_OTHER): Payer: Medicare Other | Admitting: Cardiology

## 2013-07-26 ENCOUNTER — Encounter: Payer: Self-pay | Admitting: Cardiology

## 2013-07-26 VITALS — BP 108/63 | HR 67 | Ht 66.0 in

## 2013-07-26 DIAGNOSIS — E1159 Type 2 diabetes mellitus with other circulatory complications: Secondary | ICD-10-CM

## 2013-07-26 DIAGNOSIS — I259 Chronic ischemic heart disease, unspecified: Secondary | ICD-10-CM

## 2013-07-26 DIAGNOSIS — Z5189 Encounter for other specified aftercare: Secondary | ICD-10-CM

## 2013-07-26 DIAGNOSIS — S81802D Unspecified open wound, left lower leg, subsequent encounter: Secondary | ICD-10-CM

## 2013-07-26 DIAGNOSIS — I480 Paroxysmal atrial fibrillation: Secondary | ICD-10-CM

## 2013-07-26 DIAGNOSIS — I4891 Unspecified atrial fibrillation: Secondary | ICD-10-CM

## 2013-07-26 NOTE — Assessment & Plan Note (Signed)
EKG today shows that she is back in normal sinus rhythm with occasional PVCs.  We will continue her on her present dose of propranolol 20 mg twice a day which took place of her previous metoprolol which she is no longer taking.

## 2013-07-26 NOTE — Assessment & Plan Note (Signed)
The patient resides in the skilled nursing facility at [facility].  She has chronic edema with ulceration of her left lower leg which is presently wrapped.  The patient states that the ulcerated lesions are improving slowly.  We did not remove the dressing today

## 2013-07-26 NOTE — Patient Instructions (Addendum)
DECREASE YOUR LOSARTAN TO 50 MG   Your physician wants you to follow-up in: 6 months OV/EKG You will receive a reminder letter in the mail two months in advance. If you don't receive a letter, please call our office to schedule the follow-up appointment.

## 2013-07-26 NOTE — Progress Notes (Signed)
[name] Date of Birth:  1926-10-10 [facility] [address] [place], [place]  [address] [phone]         Fax   [phone]  History of Present Illness: [name] is seen back today for a six-month followup office visit. She is a former patient of Dr. Silva Bandy. She goes by the first name "[name]". She has a history of chronic pain, GERD, hypothyroidism, HTN, HLD, CKD, anxiety, OA, breast cancer and single vessel CAD per cath back in 2006. She had a nuclear stress test back in April of 2011 showing normal EF. Her perfusion imaging was abnormal but correlates to her known occluded RCA. She has been managed medically. Last echo was about 3 years ago with results as noted below. . Review of her records from [facility] show that she has had issues with pressure ulcers and general aging concerns. The patient now lives in the health care skilled nursing facility section at [facility]. Her husband lives in the assisted living section. They have lunch and supper together.  The patient previously had been on Coumadin but now is on just a baby aspirin for anticoagulation. She still has frequent nosebleeds. She has painful arthritis and is on fentanyl patch  Since we last saw her she [facility] after having an episode of syncope at home.  She was found to be in new atrial flutter fibrillation.  She is not a candidate for anticoagulation.  She declined hospitalization.  Her beta blocker was increased at that emergency room visit up to propranolol 20 mg twice a day.  He returns now for followup.  She states that she has been feeling well and has had no further episodes of dizziness or syncope. Her last echocardiogram was 08/14/12 and showed an ejection fraction of 60-65% with grade 2 diastolic dysfunction and no significant valve abnormalities.  Since last visit she has been having no new cardiac concerns.  She continues to have problems with lower  extremity venous stasis ulcerations which require wrapping dressing every several days by nursing staff in the skilled nursing facility.   Current Outpatient Prescriptions  Medication Sig Dispense Refill  . acetaminophen (MAPAP) 325 MG tablet Take 650 mg by mouth 2 (two) times daily.      . Aloe-Sodium Chloride (AYR SALINE NASAL GEL NA) Place 1 application into the nose 2 (two) times daily. Both nostrils      . aspirin 81 MG chewable tablet Chew 81 mg by mouth every morning.       . clonazePAM (KLONOPIN) 0.5 MG tablet Take 0.5 mg by mouth as needed for anxiety. Take one tab as needed      . docusate sodium (COLACE) 100 MG capsule Take 100 mg by mouth 2 (two) times daily.       . dorzolamide-timolol (COSOPT) 22.3-6.8 MG/ML ophthalmic solution Place 1 drop into the left eye every 12 (twelve) hours.      . fentaNYL (DURAGESIC - DOSED MCG/HR) 100 MCG/HR Apply 1 patch every 3 hours. Change site  10 patch  0  . furosemide (LASIX) 40 MG tablet Take 40-80 mg by mouth 2 (two) times daily. 80 mg in the am and 40 mg at lunch (2 pm)      . HYDROcodone-acetaminophen (NORCO) 10-325 MG per tablet Take 0.5 tablets by mouth every 4 (four) hours as needed. For pain      . hydrOXYzine (ATARAX/VISTARIL) 25 MG tablet Take 25 mg  by mouth at bedtime as needed. For itching      . insulin aspart (NOVOLOG) 100 UNIT/ML injection Inject 8 Units into the skin 2 (two) times daily. If CBG is greater than 100      . insulin glargine (LANTUS) 100 UNIT/ML injection Inject 24 Units into the skin at bedtime.       . isosorbide mononitrate (IMDUR) 60 MG 24 hr tablet Take 60 mg by mouth every morning.       [name] levothyroxine (SYNTHROID, LEVOTHROID) 75 MCG tablet Take 75-112.5 mcg by mouth daily. Take 1.5 tablets (112.62mcg) on Saturday. Take 1 tablet (75 mcg) daily all other days of the week      . loratadine (CLARITIN) 10 MG tablet Take 10 mg by mouth as needed for allergies.      [name] LORazepam (ATIVAN) 0.5 MG tablet Take 1 mg by mouth  every 12 (twelve) hours as needed. Severe agitation      . losartan (COZAAR) 50 MG tablet Take 50 mg by mouth daily.      [name] lubiprostone (AMITIZA) 24 MCG capsule Take 24 mcg by mouth 2 (two) times daily with a meal.        . meloxicam (MOBIC) 15 MG tablet Take 15 mg by mouth daily.      . Multiple Vitamins-Minerals (CERTA-VITE-LUTEIN PO) Take 1 tablet by mouth daily.       . nitroGLYCERIN (NITROSTAT) 0.4 MG SL tablet Place 0.4 mg under the tongue every 5 (five) minutes as needed. For chest pain      . Nutritional Supplements (RESOURCE ARGINAID PO) Take 1 packet by mouth 2 (two) times daily.      [name] omeprazole (PRILOSEC) 20 MG capsule Take 20 mg by mouth 2 (two) times daily.      [name] oxymetazoline (AFRIN) 0.05 % nasal spray Place 2 sprays into the nose 2 (two) times daily as needed. For congestion      . polyethylene glycol (MIRALAX / GLYCOLAX) packet Take 17 g by mouth daily.      . pramoxine-mineral oil-zinc (TUCKS) 1-12.5 % rectal ointment Place 1 application rectally as needed for hemorrhoids.      . predniSONE (DELTASONE) 5 MG tablet Take 5 mg by mouth daily.      . Probiotic Product (ALIGN) 4 MG CAPS Take 1 capsule by mouth daily.      . propranolol (INDERAL) 20 MG tablet Take 20 mg by mouth 2 (two) times daily.      [name] spironolactone (ALDACTONE) 25 MG tablet Take 25 mg by mouth 2 (two) times daily.      . Vitamin D, Ergocalciferol, (DRISDOL) 50000 UNITS CAPS Take 50,000 Units by mouth every 30 (thirty) days. On the first of the month       No current facility-administered medications for this visit.    Allergies  Allergen Reactions  . Penicillins Other (See Comments)    Per MAR  . Bee Venom Other (See Comments)    Per MAR  . Celebrex [Celecoxib] Other (See Comments)    Per MAR  . Lidocaine Hcl Other (See Comments)    Per MAR  . Phenobarbital Other (See Comments)    Per MAR  . Procaine Hcl Other (See Comments)    Per mar    Patient Active Problem List   Diagnosis Date Noted  .  PAF (paroxysmal atrial fibrillation) 07/26/2013  . A-fib 07/23/2013  . Hematoma of frontal scalp 07/02/2013  . Edema 06/27/2013  . Wound of  left leg 06/27/2013  . Ulcer of foot due to diabetes mellitus 06/27/2013  . Pressure ulcer stage II 06/18/2013  . Candidiasis of anus 06/04/2013  . Falls frequently 04/23/2013  . DNR (do not resuscitate) 04/23/2013  . Toe osteomyelitis, left 04/02/2013  . MRSA cellulitis of left foot 04/02/2013  . Type II or unspecified type diabetes mellitus with peripheral circulatory disorders, uncontrolled(250.72) 02/26/2013  . Unspecified hypothyroidism 02/26/2013  . Depression 02/26/2013  . Essential tremor 02/26/2013  . Back pain 02/26/2013  . HTN (hypertension) 02/26/2013  . Unspecified constipation 02/26/2013  . Cellulitis of leg, right 01/30/2013  . Ischemic heart disease 10/02/2012  . Leg swelling 10/02/2012  . Dyspepsia 10/02/2012  . Diabetic foot ulcer 07/15/2011  . 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 Never Smoker   Smokeless tobacco  . Not on file    History  Alcohol Use No    No family history on file.  Review of Systems: Constitutional: no fever chills diaphoresis or fatigue or change in weight.  Head and neck: no hearing loss, no epistaxis, no photophobia or visual disturbance. Respiratory: No cough, shortness of breath or wheezing. Cardiovascular: No chest pain peripheral edema, palpitations. Gastrointestinal: No abdominal distention, no abdominal pain, no change in bowel habits hematochezia or melena. Genitourinary: No dysuria, no frequency, no urgency, no nocturia. Musculoskeletal:No arthralgias, no back pain, no gait disturbance or myalgias. Neurological: No dizziness, no headaches, no numbness, no seizures, no syncope, no weakness, no tremors. Hematologic: No lymphadenopathy, no easy bruising. Psychiatric: No confusion, no hallucinations, no sleep disturbance.    Physical  Exam: Filed Vitals:   07/26/13 1019  BP: 108/63  Pulse: 67   the general appearance reveals alert elderly woman who walks with a cane.  She is in no distress.The head and neck exam reveals pupils equal and reactive.  Extraocular movements are full.  There is no scleral icterus.  The mouth and pharynx are normal.  The neck is supple.  The carotids reveal no bruits.  The jugular venous pressure is normal.  The  thyroid is not enlarged.  There is no lymphadenopathy.  The chest is clear to percussion and auscultation.  There are no rales or rhonchi.  Expansion of the chest is symmetrical.  The precordium is quiet.  The rhythm is regular with occasional PVCs.  The first heart sound is normal.  The second heart sound is physiologically split.  There is no murmur gallop rub or click.  There is no abnormal lift or heave.  The abdomen is soft and nontender.  The bowel sounds are normal.  The liver and spleen are not enlarged.  There are no abdominal masses.  There are no abdominal bruits.  Extremities reveal bilateral lower extremity edema worse on the left.  Left leg is covered with an Ace wrap. Strength is normal and symmetrical in all extremities.  There is no lateralizing weakness.  There are no sensory deficits.  The skin is warm and dry.  There is no rash.   EKG today demonstrates that she is back in normal sinus rhythm with first degree AV block and she has frequent PVCs.  There is right superior axis deviation unchanged.  The previous atrial flutter fibrillation is no longer seen  Assessment / Plan: The patient is to continue propranolol 20 mg twice a day.  Her blood pressure is lower on the higher dose of beta blocker and we will cut back on  her losartan to just 50 mg daily.  Recheck in 6 months for followup office visit and EKG

## 2013-07-26 NOTE — Assessment & Plan Note (Signed)
The patient denies any symptoms to suggest hypoglycemia

## 2013-07-26 NOTE — Assessment & Plan Note (Signed)
The patient has not had any recurrent chest pain or angina. 

## 2013-08-13 ENCOUNTER — Encounter: Payer: Self-pay | Admitting: Nurse Practitioner

## 2013-08-13 ENCOUNTER — Non-Acute Institutional Stay (SKILLED_NURSING_FACILITY): Payer: Medicare Other | Admitting: Nurse Practitioner

## 2013-08-13 DIAGNOSIS — M549 Dorsalgia, unspecified: Secondary | ICD-10-CM

## 2013-08-13 DIAGNOSIS — I10 Essential (primary) hypertension: Secondary | ICD-10-CM

## 2013-08-13 DIAGNOSIS — I4891 Unspecified atrial fibrillation: Secondary | ICD-10-CM

## 2013-08-13 DIAGNOSIS — L97509 Non-pressure chronic ulcer of other part of unspecified foot with unspecified severity: Secondary | ICD-10-CM

## 2013-08-13 DIAGNOSIS — M25559 Pain in unspecified hip: Secondary | ICD-10-CM

## 2013-08-13 DIAGNOSIS — E039 Hypothyroidism, unspecified: Secondary | ICD-10-CM

## 2013-08-13 DIAGNOSIS — K5900 Constipation, unspecified: Secondary | ICD-10-CM

## 2013-08-13 DIAGNOSIS — R296 Repeated falls: Secondary | ICD-10-CM

## 2013-08-13 DIAGNOSIS — R609 Edema, unspecified: Secondary | ICD-10-CM

## 2013-08-13 DIAGNOSIS — Z9181 History of falling: Secondary | ICD-10-CM

## 2013-08-13 DIAGNOSIS — M25551 Pain in right hip: Secondary | ICD-10-CM

## 2013-08-13 DIAGNOSIS — E1169 Type 2 diabetes mellitus with other specified complication: Secondary | ICD-10-CM

## 2013-08-13 DIAGNOSIS — F32A Depression, unspecified: Secondary | ICD-10-CM

## 2013-08-13 DIAGNOSIS — E1159 Type 2 diabetes mellitus with other circulatory complications: Secondary | ICD-10-CM

## 2013-08-13 DIAGNOSIS — G250 Essential tremor: Secondary | ICD-10-CM

## 2013-08-13 DIAGNOSIS — F329 Major depressive disorder, single episode, unspecified: Secondary | ICD-10-CM

## 2013-08-13 DIAGNOSIS — I259 Chronic ischemic heart disease, unspecified: Secondary | ICD-10-CM

## 2013-08-13 DIAGNOSIS — E11621 Type 2 diabetes mellitus with foot ulcer: Secondary | ICD-10-CM

## 2013-08-13 NOTE — Assessment & Plan Note (Signed)
Controlled on Losartan 100mg daily, monitor blood pressure daily                   

## 2013-08-13 NOTE — Assessment & Plan Note (Signed)
Rate controlled. Propranolol was increased to 20mg bid for better rate control in setting of HTN and essential tremor.                                

## 2013-08-13 NOTE — Assessment & Plan Note (Signed)
Told me she fell 7-8pm last when she came out of her bathroom. She hit her right shoulder and hip against wall because of her foot turned a wrong way. C/o the right shoulder and hip pain-pain was reproduced on my palpation. She is able to ambulate with walker. Will obtain X-ray R shoulder, humerus, and hip.

## 2013-08-13 NOTE — Assessment & Plan Note (Signed)
Still had angina occasionally, f/u Cardiology, continue with Imdur 60mg and Prn NTG(last dose 02/12/13)                         

## 2013-08-13 NOTE — Assessment & Plan Note (Signed)
Stable on colace 100 x2 qhs and Amitiza 24mcg bid and MiraLax daily             

## 2013-08-13 NOTE — Progress Notes (Signed)
Patient ID: [name], female   DOB: 1926/02/10, 79 y.o.   MRN: [id number] Code Status: DNR  Allergies  Allergen Reactions  . Penicillins Other (See Comments)    Per MAR  . Bee Venom Other (See Comments)    Per MAR  . Celebrex [Celecoxib] Other (See Comments)    Per MAR  . Lidocaine Hcl Other (See Comments)    Per MAR  . Phenobarbital Other (See Comments)    Per MAR  . Procaine Hcl Other (See Comments)    Per mar    Chief Complaint  Patient presents with  . Medical Managment of Chronic Issues    the R hip/shoulder pain, s/p fall, blood sugar.     HPI: Patient is a 79 y.o. female seen in the SNF at [facility] today for evaluation of the right shoulder, upper right arm, right hip pain, s/p fall, blood sugar,  and other chronic medical conditions.  Problem List Items Addressed This Visit   A-fib     Rate controlled. Propranolol was increased to 20mg  bid for better rate control in setting of HTN and essential tremor.       Back pain     Chronic, pain is controlled with Fentanyl 130mcg/hr and Norco 10/325 q4hr                    Depression     Stable on Cymbalta 30mg                      Edema     Chronic venous insufficiency and dermatitis. Better with Furosemide 80mg  am and 40mg  pm with  Spironolactone 25mg  bid since 06/27/13--Bun/creat 47/1.12 07/01/13. Chronic LLE is bigger than the right.                         Essential tremor     Mild head and hands tremor-not disabling. Takes Propranolol 10mg  bid                           Falls frequently     Told me she fell 7-8pm last when she came out of her bathroom. She hit her right shoulder and hip against wall because of her foot turned a wrong way. C/o the right shoulder and hip pain-pain was reproduced on my palpation. She is able to ambulate with walker. Will obtain X-ray R shoulder, humerus, and hip.     HTN (hypertension) - Primary   Controlled on Losartan 100mg  daily, monitor blood pressure daily                          Ischemic heart disease     Still had angina occasionally, f/u Cardiology, continue with Imdur 60mg  and Prn NTG(last dose 02/12/13)        Right hip pain     New, sustained from hit her hip against wall when she came out her bathroom when her foot turned a wrong way.     Type II or unspecified type diabetes mellitus with peripheral circulatory disorders, uncontrolled(250.72) (Chronic)     Improved, Hgb A1c from 9.8 02/11/13 to 7.7 05/27/13. Takes Lantus 24units qd, Novolog 12 units with lunch and 8 units breakfast/dinner for CBG>100. Am 409-811, lunch 105-299, dinner 99-259.             Ulcer of foot due to diabetes mellitus  Medial plantar aspect of the left forefoot--larger, tunneling, but center of the growth is flattened below the skin surface that the patient is able to walk on w/o excessive pain, continue to  pack the wound with hydrogel Nu gauze daily-observe the wound.         Unspecified constipation     Stable on colace 100 x2 qhs and Amitiza bid and MiraLax daily              Unspecified hypothyroidism (Chronic)     Corrected with Levothyroxine 112.32mcg, last TSH 3.677 01/24/13, updated ZOX0.960 05/27/13                       Review of Systems: Review of Systems  Constitutional: Negative for fever, chills, weight loss, malaise/fatigue and diaphoresis.  HENT: Positive for hearing loss. Negative for congestion, sore throat, neck pain and ear discharge.   Eyes: Negative for pain, discharge and redness.  Respiratory: Negative for cough, sputum production, shortness of breath and wheezing.   Cardiovascular: Positive for leg swelling and PND. Negative for chest pain, palpitations, orthopnea and claudication.  Gastrointestinal: Negative for heartburn, nausea, vomiting, abdominal pain, diarrhea, constipation and blood in stool.    Genitourinary: Positive for frequency. Negative for dysuria, urgency and flank pain.  Musculoskeletal: Positive for myalgias, back pain, joint pain and falls.       C/o the right shoulder, upper arm, hip pain from fall last night 08/12/13. Able to ambulate with walker.   Skin: Positive for rash (chronic BLE venous dermatitis. Left medial forefoot diabetic foot ulcer). Negative for itching.       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 Intergluteal cleft erythema and superficial skin missing. Left forefoot diabetic ulcer--chronic about a quarter size. Pressure ulcers at R+L buttocks-in mirror image-2x2cm and superficial. Patchy dark color skin changes chronically lower abd, lower back, buttocks, thighs.   Neurological: Positive for tremors. Negative for dizziness, tingling, sensory change, speech change, focal weakness, seizures, loss of consciousness, weakness and headaches.  Endo/Heme/Allergies: Negative for environmental allergies and polydipsia. Does not bruise/bleed easily.  Psychiatric/Behavioral: Positive for memory loss. Negative for depression and hallucinations. The patient is nervous/anxious. The patient does not have insomnia.      Past Medical History  Diagnosis Date  . Diabetes mellitus   . Hypertension   . Heart murmur   . Benign familial tremor   . Rosacea   . Diverticulitis   . Heart failure     diastolic heart failure  . TIA (transient ischemic attack) 2010  . Arthritis   . Breast cancer     breast -rt  . CAD (coronary artery disease)     single vessel CAD per cath in 2006; scattered nonobstructive disease in the left system, left to right collaterals to the RCA which was occluded by flush shots; she has been managed medically   Past Surgical History  Procedure Laterality Date  . Mastectomy partial / lumpectomy  11/02/2000    Right, with SLN  . Mastectomy partial / lumpectomy  05/15/2008    Left  . Breast lumpectomy  01/19/2010    right  . Mastectomy  03/31/2010    Right    Social History:   reports that she has never smoked. She does not have any smokeless tobacco history on file. She reports that she does not drink alcohol or use illicit drugs.  Medications: Patient's Medications  New Prescriptions   No medications on file  Previous Medications  ACETAMINOPHEN (MAPAP) 325 MG TABLET    Take 650 mg by mouth 2 (two) times daily.   ALOE-SODIUM CHLORIDE (AYR SALINE NASAL GEL NA)    Place 1 application into the nose 2 (two) times daily. Both nostrils   ASPIRIN 81 MG CHEWABLE TABLET    Chew 81 mg by mouth every morning.    CLONAZEPAM (KLONOPIN) 0.5 MG TABLET    Take 0.5 mg by mouth as needed for anxiety. Take one tab as needed   DOCUSATE SODIUM (COLACE) 100 MG CAPSULE    Take 100 mg by mouth 2 (two) times daily.    DORZOLAMIDE-TIMOLOL (COSOPT) 22.3-6.8 MG/ML OPHTHALMIC SOLUTION    Place 1 drop into the left eye every 12 (twelve) hours.   FENTANYL (DURAGESIC - DOSED MCG/HR) 100 MCG/HR    Apply 1 patch every 3 hours. Change site   FUROSEMIDE (LASIX) 40 MG TABLET    Take 40-80 mg by mouth 2 (two) times daily. 80 mg in the am and 40 mg at lunch (2 pm)   HYDROCODONE-ACETAMINOPHEN (NORCO) 10-325 MG PER TABLET    Take 0.5 tablets by mouth every 4 (four) hours as needed. For pain   HYDROXYZINE (ATARAX/VISTARIL) 25 MG TABLET    Take 25 mg by mouth at bedtime as needed. For itching   INSULIN ASPART (NOVOLOG) 100 UNIT/ML INJECTION    Inject 8 Units into the skin 2 (two) times daily. If CBG is greater than 100   INSULIN GLARGINE (LANTUS) 100 UNIT/ML INJECTION    Inject 24 Units into the skin at bedtime.    ISOSORBIDE MONONITRATE (IMDUR) 60 MG 24 HR TABLET    Take 60 mg by mouth every morning.    LEVOTHYROXINE (SYNTHROID, LEVOTHROID) 75 MCG TABLET    Take 75-112.5 mcg by mouth daily. Take 1.5 tablets (112.67mcg) on Saturday. Take 1 tablet (75 mcg) daily all other days of the week   LORATADINE (CLARITIN) 10 MG TABLET    Take 10 mg by mouth as needed for allergies.   LORAZEPAM  (ATIVAN) 0.5 MG TABLET    Take 1 mg by mouth every 12 (twelve) hours as needed. Severe agitation   LOSARTAN (COZAAR) 50 MG TABLET    Take 50 mg by mouth daily.   LUBIPROSTONE (AMITIZA) 24 MCG CAPSULE    Take 24 mcg by mouth 2 (two) times daily with a meal.     MELOXICAM (MOBIC) 15 MG TABLET    Take 15 mg by mouth daily.   MULTIPLE VITAMINS-MINERALS (CERTA-VITE-LUTEIN PO)    Take 1 tablet by mouth daily.    NITROGLYCERIN (NITROSTAT) 0.4 MG SL TABLET    Place 0.4 mg under the tongue every 5 (five) minutes as needed. For chest pain   NUTRITIONAL SUPPLEMENTS (RESOURCE ARGINAID PO)    Take 1 packet by mouth 2 (two) times daily.   OMEPRAZOLE (PRILOSEC) 20 MG CAPSULE    Take 20 mg by mouth 2 (two) times daily.   OXYMETAZOLINE (AFRIN) 0.05 % NASAL SPRAY    Place 2 sprays into the nose 2 (two) times daily as needed. For congestion   POLYETHYLENE GLYCOL (MIRALAX / GLYCOLAX) PACKET    Take 17 g by mouth daily.   PRAMOXINE-MINERAL OIL-ZINC (TUCKS) 1-12.5 % RECTAL OINTMENT    Place 1 application rectally as needed for hemorrhoids.   PREDNISONE (DELTASONE) 5 MG TABLET    Take 5 mg by mouth daily.   PROBIOTIC PRODUCT (ALIGN) 4 MG CAPS    Take 1 capsule by mouth daily.  PROPRANOLOL (INDERAL) 20 MG TABLET    Take 20 mg by mouth 2 (two) times daily.   SPIRONOLACTONE (ALDACTONE) 25 MG TABLET    Take 25 mg by mouth 2 (two) times daily.   VITAMIN D, ERGOCALCIFEROL, (DRISDOL) 50000 UNITS CAPS    Take 50,000 Units by mouth every 30 (thirty) days. On the first of the month  Modified Medications   No medications on file  Discontinued Medications   No medications on file     Physical Exam: Physical Exam  Constitutional: She appears well-developed.  HENT:  Head: Normocephalic and atraumatic.  Left Ear: External ear normal.  Eyes: Conjunctivae and EOM are normal. Pupils are equal, round, and reactive to light.  Neck: Neck supple. No JVD present. No thyromegaly present.  Cardiovascular: Normal rate and regular  rhythm.   Murmur heard. Pulmonary/Chest: Effort normal.  Abdominal: Soft. There is no tenderness. There is no rebound.  Musculoskeletal: She exhibits edema.  The right shoulder/upper arm and hip pain  Lymphadenopathy:    She has no cervical adenopathy.  Neurological: She is alert. No cranial nerve deficit. Coordination normal.  Skin: Skin is warm and dry. Rash noted.     Erythematous and chronic swelling in BLE. Left forefoot diabetic ulcer-larger and deeper in size-wound bed is clean with tunneling(priviously protruding center is flattened and is lower than the skin surface.  Intergluteal cleft skin breakdown with superficial skin missing and erythematous extended to the R+L 1/3 of inner buttocks--new pressure ulcers R+L buttocks near the intergluteal cleft with mirror imaging about a quarter seize stage II presently.    Psychiatric: Her mood appears anxious. Her affect is not blunt, not labile and not inappropriate. Her speech is rapid and/or pressured (occasionally ). Her speech is not delayed and not slurred. She is hyperactive. She is not agitated, not aggressive, not slowed, not withdrawn, not actively hallucinating and not combative. Thought content is not paranoid and not delusional. She does not exhibit a depressed mood. She exhibits abnormal recent memory.    Filed Vitals:   08/13/13 1211  BP: 130/70  Pulse: 64  Temp: 98.2 F (36.8 C)  TempSrc: Tympanic  Resp: 20      Labs reviewed: Basic Metabolic Panel:  Recent Labs  16/10/96 1852 01/24/13  05/27/13 07/01/13 07/20/13 1921  NA 139 139  < > 137 136* 136  K 4.5 4.6  < > 4.5 4.2 3.9  CL 98  --   --   --   --  97  CO2 29  --   --   --   --  30  GLUCOSE 255*  --   --   --   --  147*  BUN 47* 41*  < > 44* 47* 46*  CREATININE 0.94 1.1  < > 1.3* 1.1 1.00  CALCIUM 9.8  --   --   --   --  9.3  TSH 1.511 3.68  --  3.96  --   --   < > = values in this interval not displayed. 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 Bony irregularity and small chip fractures involve the turf of the distal phalanx left second toe. Complete dislocation of the interphalangeal joint of the left middle toe with displacement of the distal phalanx medially and associated with a few small bone densities involving the distal portion proximal phalanx most consistent with chip fractures. The findings may be secondary to osteomyelitis and continued follow up is recommended.   Assessment/Plan HTN (hypertension) Controlled on Losartan 100mg  daily, monitor blood pressure daily                        Unspecified hypothyroidism Corrected with Levothyroxine 112.38mcg, last TSH 3.677 01/24/13, updated XBJ4.782 05/27/13                  Unspecified constipation Stable on colace 100 x2 qhs and Amitiza bid and MiraLax daily            Ulcer of foot due to diabetes mellitus Medial plantar aspect of the left forefoot--larger, tunneling, but center of the growth is flattened below the skin surface that the patient is able to walk on w/o excessive pain, continue to  pack the wound with hydrogel Nu gauze daily-observe the wound.       Type II or unspecified type diabetes mellitus with peripheral circulatory disorders, uncontrolled(250.72) Improved, Hgb A1c from 9.8 02/11/13 to 7.7 05/27/13. Takes Lantus 24units qd, Novolog 12 units with lunch and 8 units breakfast/dinner for CBG>100. Am 956-213, lunch 105-299, dinner 99-259.           Ischemic heart  disease Still had angina occasionally, f/u Cardiology, continue with Imdur 60mg  and Prn NTG(last dose 02/12/13)      Essential tremor Mild head and hands tremor-not disabling. Takes Propranolol 10mg  bid                         Depression Stable on Cymbalta 30mg                    A-fib Rate controlled. Propranolol was increased to 20mg  bid for better rate control in setting of HTN and essential tremor.     Back pain Chronic, pain is controlled with Fentanyl 158mcg/hr and Norco 10/325 q4hr                  Falls frequently Told me she fell 7-8pm last when she came out of her bathroom. She hit her right shoulder and hip against wall because of her foot turned a wrong way. C/o the right shoulder and hip pain-pain was reproduced on my palpation. She is able to ambulate with walker. Will obtain X-ray R shoulder, humerus, and hip.   Edema Chronic venous insufficiency and dermatitis. Better with Furosemide 80mg  am and 40mg  pm with  Spironolactone 25mg  bid since 06/27/13--Bun/creat 47/1.12 07/01/13. Chronic LLE is bigger than the right.                       Right hip pain New, sustained from hit her hip against wall when she came out her bathroom when her foot turned a wrong way.     Family/ Staff Communication: observe the patient.   Goals of Care: SNF  Labs/tests ordered: X-ray R shoulder, R humerus, R  hip

## 2013-08-13 NOTE — Assessment & Plan Note (Signed)
Medial plantar aspect of the left forefoot--larger, tunneling, but center of the growth is flattened below the skin surface that the patient is able to walk on w/o excessive pain, continue to  pack the wound with hydrogel Nu gauze daily-observe the wound.

## 2013-08-13 NOTE — Assessment & Plan Note (Signed)
Mild head and hands tremor-not disabling. Takes Propranolol 10mg bid                                              

## 2013-08-13 NOTE — Assessment & Plan Note (Signed)
Improved, Hgb A1c from 9.8 02/11/13 to 7.7 05/27/13. Takes Lantus 24units qd, Novolog 12 units with lunch and 8 units breakfast/dinner for CBG>100. Am 191-478, lunch 105-299, dinner 99-259.

## 2013-08-13 NOTE — Assessment & Plan Note (Signed)
Corrected with Levothyroxine 112.5mcg, last TSH 3.677 01/24/13, updated TSH3.965 05/27/13                   

## 2013-08-13 NOTE — Assessment & Plan Note (Signed)
Stable on Cymbalta 30mg  

## 2013-08-13 NOTE — Assessment & Plan Note (Signed)
Chronic, pain is controlled with Fentanyl 100mcg/hr and Norco 10/325 q4hr                                         

## 2013-08-13 NOTE — Assessment & Plan Note (Signed)
Chronic venous insufficiency and dermatitis. Better with Furosemide 80mg am and 40mg pm with  Spironolactone 25mg bid since 06/27/13--Bun/creat 47/1.12 07/01/13. Chronic LLE is bigger than the right.                        

## 2013-08-13 NOTE — Assessment & Plan Note (Signed)
New, sustained from hit her hip against wall when she came out her bathroom when her foot turned a wrong way.

## 2013-08-16 ENCOUNTER — Encounter: Payer: Self-pay | Admitting: Nurse Practitioner

## 2013-08-16 ENCOUNTER — Non-Acute Institutional Stay (SKILLED_NURSING_FACILITY): Payer: Medicare Other | Admitting: Nurse Practitioner

## 2013-08-16 DIAGNOSIS — M549 Dorsalgia, unspecified: Secondary | ICD-10-CM

## 2013-08-16 DIAGNOSIS — R5381 Other malaise: Secondary | ICD-10-CM | POA: Insufficient documentation

## 2013-08-16 DIAGNOSIS — F3289 Other specified depressive episodes: Secondary | ICD-10-CM

## 2013-08-16 DIAGNOSIS — E039 Hypothyroidism, unspecified: Secondary | ICD-10-CM

## 2013-08-16 DIAGNOSIS — E1159 Type 2 diabetes mellitus with other circulatory complications: Secondary | ICD-10-CM

## 2013-08-16 DIAGNOSIS — I10 Essential (primary) hypertension: Secondary | ICD-10-CM

## 2013-08-16 DIAGNOSIS — R609 Edema, unspecified: Secondary | ICD-10-CM

## 2013-08-16 DIAGNOSIS — I4891 Unspecified atrial fibrillation: Secondary | ICD-10-CM

## 2013-08-16 DIAGNOSIS — G252 Other specified forms of tremor: Secondary | ICD-10-CM

## 2013-08-16 DIAGNOSIS — F329 Major depressive disorder, single episode, unspecified: Secondary | ICD-10-CM

## 2013-08-16 DIAGNOSIS — G250 Essential tremor: Secondary | ICD-10-CM

## 2013-08-16 DIAGNOSIS — K5900 Constipation, unspecified: Secondary | ICD-10-CM

## 2013-08-16 DIAGNOSIS — R4182 Altered mental status, unspecified: Secondary | ICD-10-CM

## 2013-08-16 LAB — BASIC METABOLIC PANEL
Creatinine: 1.1 mg/dL (ref 0.5–1.1)
Glucose: 176 mg/dL
Potassium: 4.2 mmol/L (ref 3.4–5.3)
Sodium: 137 mmol/L (ref 137–147)

## 2013-08-16 LAB — CBC AND DIFFERENTIAL: Hemoglobin: 13.9 g/dL (ref 12.0–16.0)

## 2013-08-16 NOTE — Assessment & Plan Note (Signed)
Mild head and hands tremor-not disabling. Takes Propranolol 10mg bid                                              

## 2013-08-16 NOTE — Assessment & Plan Note (Signed)
Stated she has no energy, didn't feel like to get and go, worries about not getting things done as usual, lost #7 Ibs in one week, will obtain UA C/S, CBC, BMP-she is takin Furosemide 100mg  and Spironolactone 50mg  daily for edema. Risk for dehydration.

## 2013-08-16 NOTE — Assessment & Plan Note (Signed)
Staff reported the patient has been more confused since this am w/o focal neurological deficits. CBC, CMP, UA stat. MMSE. Monitor the patient closely.

## 2013-08-16 NOTE — Assessment & Plan Note (Signed)
Chronic, pain is controlled with Fentanyl 100mcg/hr and Norco 10/325 q4hr                                         

## 2013-08-16 NOTE — Assessment & Plan Note (Signed)
Controlled on Losartan 100mg  daily, monitor blood pressure daily

## 2013-08-16 NOTE — Assessment & Plan Note (Signed)
Stable on Cymbalta 30mg  

## 2013-08-16 NOTE — Assessment & Plan Note (Signed)
Stable on colace 100 x2 qhs and Amitiza bid and MiraLax daily

## 2013-08-16 NOTE — Progress Notes (Signed)
Patient ID: [name], female   DOB: Aug 30, 1926, 79 y.o.   MRN: [id number] Code Status: DNR  Allergies  Allergen Reactions  . Penicillins Other (See Comments)    Per MAR  . Bee Venom Other (See Comments)    Per MAR  . Celebrex [Celecoxib] Other (See Comments)    Per MAR  . Lidocaine Hcl Other (See Comments)    Per MAR  . Phenobarbital Other (See Comments)    Per MAR  . Procaine Hcl Other (See Comments)    Per mar    Chief Complaint  Patient presents with  . Medical Managment of Chronic Issues    confusion, fatigue    HPI: Patient is a 79 y.o. female seen in the SNF at [facility] today for evaluation fatigue, confusion,  blood sugar,  and other chronic medical conditions.  Problem List Items Addressed This Visit   A-fib     Rate controlled. Propranolol was increased to 20mg  bid for better rate control in setting of HTN and essential tremor.         Altered mental status     Staff reported the patient has been more confused since this am w/o focal neurological deficits. CBC, CMP, UA stat. MMSE. Monitor the patient closely.     Back pain     Chronic, pain is controlled with Fentanyl 175mcg/hr and Norco 10/325 q4hr                      Depression     Stable on Cymbalta 30mg                        Edema     Chronic venous insufficiency and dermatitis. Better with Furosemide 80mg  am and 40mg  pm with  Spironolactone 25mg  bid since 06/27/13--Bun/creat 47/1.12 07/01/13. Chronic LLE is bigger than the right.                           Essential tremor     Mild head and hands tremor-not disabling. Takes Propranolol 10mg  bid                             HTN (hypertension)     Controlled on Losartan 100mg  daily, monitor blood pressure daily                            Other malaise and fatigue - Primary     Stated she has no energy, didn't feel like to get and go, worries  about not getting things done as usual, lost #7 Ibs in one week, will obtain UA C/S, CBC, BMP-she is takin Furosemide 100mg  and Spironolactone 50mg  daily for edema. Risk for dehydration.     Type II or unspecified type diabetes mellitus with peripheral circulatory disorders, uncontrolled(250.72) (Chronic)     Improved, Hgb A1c from 9.8 02/11/13 to 7.7 05/27/13. Takes Lantus 24units qd, Novolog 12 units with lunch and 8 units breakfast/dinner for CBG>100. Am 409-811, lunch 105-299, dinner 99-259.               Unspecified constipation     Stable on colace 100 x2 qhs and Amitiza bid and MiraLax daily                Unspecified hypothyroidism (Chronic)     Corrected  with Levothyroxine 112.14mcg, last TSH 3.677 01/24/13, updated ZOX0.960 05/27/13                         Review of Systems: Review of Systems  Constitutional: Positive for malaise/fatigue. Negative for fever, chills, weight loss and diaphoresis.  HENT: Positive for hearing loss. Negative for congestion, sore throat, neck pain and ear discharge.   Eyes: Negative for pain, discharge and redness.  Respiratory: Negative for cough, sputum production, shortness of breath and wheezing.   Cardiovascular: Positive for leg swelling and PND. Negative for chest pain, palpitations, orthopnea and claudication.  Gastrointestinal: Negative for heartburn, nausea, vomiting, abdominal pain, diarrhea, constipation and blood in stool.  Genitourinary: Positive for frequency. Negative for dysuria, urgency and flank pain.  Musculoskeletal: Positive for myalgias, back pain, joint pain and falls.       C/o the right shoulder, upper arm, hip pain from fall last night 08/12/13. Able to ambulate with walker.   Skin: Positive for rash (chronic BLE venous dermatitis. Left medial forefoot diabetic foot ulcer). Negative for itching.       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 Intergluteal cleft erythema and superficial skin  missing. Left forefoot diabetic ulcer--chronic about a quarter size. Pressure ulcers at R+L buttocks-in mirror image-2x2cm and superficial. Patchy dark color skin changes chronically lower abd, lower back, buttocks, thighs.   Neurological: Positive for tremors. Negative for dizziness, tingling, sensory change, speech change, focal weakness, seizures, loss of consciousness, weakness and headaches.  Endo/Heme/Allergies: Negative for environmental allergies and polydipsia. Does not bruise/bleed easily.  Psychiatric/Behavioral: Positive for memory loss. Negative for depression and hallucinations. The patient is nervous/anxious. The patient does not have insomnia.        Confusion.      Past Medical History  Diagnosis Date  . Diabetes mellitus   . Hypertension   . Heart murmur   . Benign familial tremor   . Rosacea   . Diverticulitis   . Heart failure     diastolic heart failure  . TIA (transient ischemic attack) 2010  . Arthritis   . Breast cancer     breast -rt  . CAD (coronary artery disease)     single vessel CAD per cath in 2006; scattered nonobstructive disease in the left system, left to right collaterals to the RCA which was occluded by flush shots; she has been managed medically   Past Surgical History  Procedure Laterality Date  . Mastectomy partial / lumpectomy  11/02/2000    Right, with SLN  . Mastectomy partial / lumpectomy  05/15/2008    Left  . Breast lumpectomy  01/19/2010    right  . Mastectomy  03/31/2010    Right   Social History:   reports that she has never smoked. She does not have any smokeless tobacco history on file. She reports that she does not drink alcohol or use illicit drugs.  Medications: Patient's Medications  New Prescriptions   No medications on file  Previous Medications   ACETAMINOPHEN (MAPAP) 325 MG TABLET    Take 650 mg by mouth 2 (two) times daily.   ALOE-SODIUM CHLORIDE (AYR SALINE NASAL GEL NA)    Place 1 application into the nose 2 (two)  times daily. Both nostrils   ASPIRIN 81 MG CHEWABLE TABLET    Chew 81 mg by mouth every morning.    CLONAZEPAM (KLONOPIN) 0.5 MG TABLET    Take 0.5 mg by mouth as needed for anxiety. Take one  tab as needed   DOCUSATE SODIUM (COLACE) 100 MG CAPSULE    Take 100 mg by mouth 2 (two) times daily.    DORZOLAMIDE-TIMOLOL (COSOPT) 22.3-6.8 MG/ML OPHTHALMIC SOLUTION    Place 1 drop into the left eye every 12 (twelve) hours.   FENTANYL (DURAGESIC - DOSED MCG/HR) 100 MCG/HR    Apply 1 patch every 3 hours. Change site   FUROSEMIDE (LASIX) 40 MG TABLET    Take 40-80 mg by mouth 2 (two) times daily. 80 mg in the am and 40 mg at lunch (2 pm)   HYDROCODONE-ACETAMINOPHEN (NORCO) 10-325 MG PER TABLET    Take 0.5 tablets by mouth every 4 (four) hours as needed. For pain   HYDROXYZINE (ATARAX/VISTARIL) 25 MG TABLET    Take 25 mg by mouth at bedtime as needed. For itching   INSULIN ASPART (NOVOLOG) 100 UNIT/ML INJECTION    Inject 8 Units into the skin 2 (two) times daily. If CBG is greater than 100   INSULIN GLARGINE (LANTUS) 100 UNIT/ML INJECTION    Inject 24 Units into the skin at bedtime.    ISOSORBIDE MONONITRATE (IMDUR) 60 MG 24 HR TABLET    Take 60 mg by mouth every morning.    LEVOTHYROXINE (SYNTHROID, LEVOTHROID) 75 MCG TABLET    Take 75-112.5 mcg by mouth daily. Take 1.5 tablets (112.97mcg) on Saturday. Take 1 tablet (75 mcg) daily all other days of the week   LORATADINE (CLARITIN) 10 MG TABLET    Take 10 mg by mouth as needed for allergies.   LORAZEPAM (ATIVAN) 0.5 MG TABLET    Take 1 mg by mouth every 12 (twelve) hours as needed. Severe agitation   LOSARTAN (COZAAR) 50 MG TABLET    Take 50 mg by mouth daily.   LUBIPROSTONE (AMITIZA) 24 MCG CAPSULE    Take 24 mcg by mouth 2 (two) times daily with a meal.     MELOXICAM (MOBIC) 15 MG TABLET    Take 15 mg by mouth daily.   MULTIPLE VITAMINS-MINERALS (CERTA-VITE-LUTEIN PO)    Take 1 tablet by mouth daily.    NITROGLYCERIN (NITROSTAT) 0.4 MG SL TABLET    Place 0.4  mg under the tongue every 5 (five) minutes as needed. For chest pain   NUTRITIONAL SUPPLEMENTS (RESOURCE ARGINAID PO)    Take 1 packet by mouth 2 (two) times daily.   OMEPRAZOLE (PRILOSEC) 20 MG CAPSULE    Take 20 mg by mouth 2 (two) times daily.   OXYMETAZOLINE (AFRIN) 0.05 % NASAL SPRAY    Place 2 sprays into the nose 2 (two) times daily as needed. For congestion   POLYETHYLENE GLYCOL (MIRALAX / GLYCOLAX) PACKET    Take 17 g by mouth daily.   PRAMOXINE-MINERAL OIL-ZINC (TUCKS) 1-12.5 % RECTAL OINTMENT    Place 1 application rectally as needed for hemorrhoids.   PREDNISONE (DELTASONE) 5 MG TABLET    Take 5 mg by mouth daily.   PROBIOTIC PRODUCT (ALIGN) 4 MG CAPS    Take 1 capsule by mouth daily.   PROPRANOLOL (INDERAL) 20 MG TABLET    Take 20 mg by mouth 2 (two) times daily.   SPIRONOLACTONE (ALDACTONE) 25 MG TABLET    Take 25 mg by mouth 2 (two) times daily.   VITAMIN D, ERGOCALCIFEROL, (DRISDOL) 50000 UNITS CAPS    Take 50,000 Units by mouth every 30 (thirty) days. On the first of the month  Modified Medications   No medications on file  Discontinued Medications   No medications  on file     Physical Exam: Physical Exam  Constitutional: She appears well-developed.  HENT:  Head: Normocephalic and atraumatic.  Left Ear: External ear normal.  Eyes: Conjunctivae and EOM are normal. Pupils are equal, round, and reactive to light.  Neck: Neck supple. No JVD present. No thyromegaly present.  Cardiovascular: Normal rate and regular rhythm.   Murmur heard. Pulmonary/Chest: Effort normal.  Abdominal: Soft. There is no tenderness. There is no rebound.  Musculoskeletal: She exhibits edema.  The right shoulder/upper arm and hip pain  Lymphadenopathy:    She has no cervical adenopathy.  Neurological: She is alert. No cranial nerve deficit. Coordination normal.  Skin: Skin is warm and dry. Rash noted.     Erythematous and chronic swelling in BLE. Left forefoot diabetic ulcer-larger and  deeper in size-wound bed is clean with tunneling(priviously protruding center is flattened and is lower than the skin surface.  Intergluteal cleft skin breakdown with superficial skin missing and erythematous extended to the R+L 1/3 of inner buttocks--new pressure ulcers R+L buttocks near the intergluteal cleft with mirror imaging about a quarter seize stage II presently.    Psychiatric: Her mood appears anxious. Her affect is not blunt, not labile and not inappropriate. Her speech is rapid and/or pressured (occasionally ). Her speech is not delayed and not slurred. She is hyperactive. She is not agitated, not aggressive, not slowed, not withdrawn, not actively hallucinating and not combative. Thought content is not paranoid and not delusional. She does not exhibit a depressed mood. She exhibits abnormal recent memory.    Filed Vitals:   08/16/13 1214  BP: 150/70  Pulse: 82  Resp: 18      Labs reviewed: Basic Metabolic Panel:  Recent Labs  16/10/96 1852 01/24/13  05/27/13 07/01/13 07/20/13 1921  NA 139 139  < > 137 136* 136  K 4.5 4.6  < > 4.5 4.2 3.9  CL 98  --   --   --   --  97  CO2 29  --   --   --   --  30  GLUCOSE 255*  --   --   --   --  147*  BUN 47* 41*  < > 44* 47* 46*  CREATININE 0.94 1.1  < > 1.3* 1.1 1.00  CALCIUM 9.8  --   --   --   --  9.3  TSH 1.511 3.68  --  3.96  --   --   < > = values in this interval not displayed. 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 Bony irregularity and small chip fractures involve the turf of the distal  phalanx left second toe. Complete dislocation of the interphalangeal joint of the left middle toe with displacement of the distal phalanx medially and associated with a few small bone densities involving the distal portion proximal phalanx most consistent with chip fractures. The  findings may be secondary to osteomyelitis and continued follow up is recommended.   08/13/24 X-ray R hip, R humerus, R shoulder: no acute fracture or lytic destructive lesion  Assessment/Plan Other malaise and fatigue Stated she has no energy, didn't feel like to get and go, worries about not getting things done as usual, lost #7 Ibs in one week, will obtain UA C/S, CBC, BMP-she is takin Furosemide 100mg  and Spironolactone 50mg  daily for edema. Risk for dehydration.   Altered mental status Staff reported the patient has been more confused since this am w/o focal neurological deficits. CBC, CMP, UA stat. MMSE. Monitor the patient closely.   A-fib Rate controlled. Propranolol was increased to 20mg  bid for better rate control in setting of HTN and essential tremor.       Back pain Chronic, pain is controlled with Fentanyl 142mcg/hr and Norco 10/325 q4hr                    Depression Stable on Cymbalta 30mg                      Edema Chronic venous insufficiency and dermatitis. Better with Furosemide 80mg  am and 40mg  pm with  Spironolactone 25mg  bid since 06/27/13--Bun/creat 47/1.12 07/01/13. Chronic LLE is bigger than the right.                         Essential tremor Mild head and hands tremor-not disabling. Takes Propranolol 10mg  bid                           HTN (hypertension) Controlled on Losartan 100mg  daily, monitor blood pressure daily                          Type II or unspecified type diabetes mellitus with peripheral circulatory disorders, uncontrolled(250.72) Improved, Hgb A1c from 9.8 02/11/13  to 7.7 05/27/13. Takes Lantus 24units qd, Novolog 12 units with lunch and 8 units breakfast/dinner for CBG>100. Am 409-811, lunch 105-299, dinner 99-259.             Unspecified hypothyroidism Corrected with Levothyroxine 112.69mcg, last TSH 3.677 01/24/13, updated BJY7.829 05/27/13                    Unspecified constipation Stable on colace 100 x2 qhs and Amitiza bid and MiraLax daily                Family/ Staff Communication: observe the patient.   Goals of Care: SNF  Labs/tests ordered: CBC, BMP, Cath UA C/S, MMSE

## 2013-08-16 NOTE — Assessment & Plan Note (Signed)
Chronic venous insufficiency and dermatitis. Better with Furosemide 80mg  am and 40mg  pm with  Spironolactone 25mg  bid since 06/27/13--Bun/creat 47/1.12 07/01/13. Chronic LLE is bigger than the right.

## 2013-08-16 NOTE — Assessment & Plan Note (Signed)
Improved, Hgb A1c from 9.8 02/11/13 to 7.7 05/27/13. Takes Lantus 24units qd, Novolog 12 units with lunch and 8 units breakfast/dinner for CBG>100. Am 113-278, lunch 105-299, dinner 99-259.          

## 2013-08-16 NOTE — Assessment & Plan Note (Signed)
Corrected with Levothyroxine 112.97mcg, last TSH 3.677 01/24/13, updated ZOX0.960 05/27/13

## 2013-08-16 NOTE — Assessment & Plan Note (Signed)
Rate controlled. Propranolol was increased to 20mg bid for better rate control in setting of HTN and essential tremor.                                

## 2013-08-23 ENCOUNTER — Encounter: Payer: Self-pay | Admitting: Nurse Practitioner

## 2013-08-23 ENCOUNTER — Non-Acute Institutional Stay (SKILLED_NURSING_FACILITY): Payer: Medicare Other | Admitting: Nurse Practitioner

## 2013-08-23 DIAGNOSIS — E039 Hypothyroidism, unspecified: Secondary | ICD-10-CM

## 2013-08-23 DIAGNOSIS — F329 Major depressive disorder, single episode, unspecified: Secondary | ICD-10-CM

## 2013-08-23 DIAGNOSIS — F0391 Unspecified dementia with behavioral disturbance: Secondary | ICD-10-CM

## 2013-08-23 DIAGNOSIS — F32A Depression, unspecified: Secondary | ICD-10-CM

## 2013-08-23 DIAGNOSIS — I259 Chronic ischemic heart disease, unspecified: Secondary | ICD-10-CM

## 2013-08-23 DIAGNOSIS — M549 Dorsalgia, unspecified: Secondary | ICD-10-CM

## 2013-08-23 DIAGNOSIS — E1159 Type 2 diabetes mellitus with other circulatory complications: Secondary | ICD-10-CM

## 2013-08-23 DIAGNOSIS — F3289 Other specified depressive episodes: Secondary | ICD-10-CM

## 2013-08-23 DIAGNOSIS — F03918 Unspecified dementia, unspecified severity, with other behavioral disturbance: Secondary | ICD-10-CM

## 2013-08-23 DIAGNOSIS — K5900 Constipation, unspecified: Secondary | ICD-10-CM

## 2013-08-23 DIAGNOSIS — G252 Other specified forms of tremor: Secondary | ICD-10-CM

## 2013-08-23 DIAGNOSIS — I4891 Unspecified atrial fibrillation: Secondary | ICD-10-CM

## 2013-08-23 DIAGNOSIS — R609 Edema, unspecified: Secondary | ICD-10-CM

## 2013-08-23 DIAGNOSIS — I10 Essential (primary) hypertension: Secondary | ICD-10-CM

## 2013-08-23 DIAGNOSIS — G250 Essential tremor: Secondary | ICD-10-CM

## 2013-08-23 NOTE — Assessment & Plan Note (Signed)
Chronic venous insufficiency and dermatitis. Better with Furosemide 80mg  am and 40mg  pm with  Spironolactone 25mg  bid since 06/27/13--Bun/creat 47/1.12 07/01/13. Chronic LLE is bigger than the right. Dc Prednisone 5mg 

## 2013-08-23 NOTE — Assessment & Plan Note (Signed)
Stable on colace 100 x2 qhs and Amitiza 24mcg bid and MiraLax daily             

## 2013-08-23 NOTE — Assessment & Plan Note (Signed)
Improved, Hgb A1c from 9.8 02/11/13 to 7.7 05/27/13. Takes Lantus 24units qd, Novolog 12 units with lunch and 8 units breakfast/dinner for CBG>100. Am 161-096, lunch 65-222, dinner 89-265 Update Hgb A1c

## 2013-08-23 NOTE — Assessment & Plan Note (Signed)
Calling or visiting her husband more frequently at night and  difficulty with personal assistance or staff's redirection. This may be related to her decline in memory. Will better manage her mood with increased Cymbalta 30mg  bid and newly added Namenda. Observe the patient.

## 2013-08-23 NOTE — Assessment & Plan Note (Signed)
Still had angina occasionally, f/u Cardiology, continue with Imdur 60mg and Prn NTG(last dose 02/12/13)                         

## 2013-08-23 NOTE — Assessment & Plan Note (Signed)
Mild head and hands tremor-not disabling. Takes Propranolol 10mg bid                                              

## 2013-08-23 NOTE — Progress Notes (Signed)
Patient ID: [name], female   DOB: [birth year], 79 y.o.   MRN: [id number]  Code Status: DNR  Allergies  Allergen Reactions  . Penicillins Other (See Comments)    Per MAR  . Bee Venom Other (See Comments)    Per MAR  . Celebrex [Celecoxib] Other (See Comments)    Per MAR  . Lidocaine Hcl Other (See Comments)    Per MAR  . Phenobarbital Other (See Comments)    Per MAR  . Procaine Hcl Other (See Comments)    Per mar    Chief Complaint  Patient presents with  . Medical Managment of Chronic Issues    staff requested to evaluate the patient's behaviors--visiting her husband at ngiht frequently and  continue with  personal hygiene independenetly.     HPI: Patient is a 79 y.o. female seen in the SNF at [facility] today for evaluation mood, memory, behaviros,  blood sugar,  and other chronic medical conditions.  Problem List Items Addressed This Visit   A-fib     Rate controlled. Propranolol was increased to 20mg  bid for better rate control in setting of HTN and essential tremor.           Back pain     Chronic, pain is controlled with Fentanyl 115mcg/hr and Norco 10/325 q4hr                        Dementia with behavioral disturbance   Relevant Medications      DULoxetine (CYMBALTA) 30 MG capsule      memantine (NAMENDA) 10 MG tablet   Depression     Calling or visiting her husband more frequently at night and  difficulty with personal assistance or staff's redirection. This may be related to her decline in memory. Will better manage her mood with increased Cymbalta 30mg  bid and newly added Namenda. Observe the patient.                       Relevant Medications      DULoxetine (CYMBALTA) 30 MG capsule   Edema     Chronic venous insufficiency and dermatitis. Better with Furosemide 80mg  am and 40mg  pm with  Spironolactone 25mg  bid since 06/27/13--Bun/creat 47/1.12 07/01/13. Chronic LLE is bigger than the right. Dc Prednisone  5mg                             Essential tremor     Mild head and hands tremor-not disabling. Takes Propranolol 10mg  bid                               HTN (hypertension)     Controlled on Losartan 100mg  daily, monitor blood pressure daily                              Ischemic heart disease     Still had angina occasionally, f/u Cardiology, continue with Imdur 60mg  and Prn NTG(last dose 02/12/13)          Type II or unspecified type diabetes mellitus with peripheral circulatory disorders, uncontrolled(250.72) (Chronic)     Improved, Hgb A1c from 9.8 02/11/13 to 7.7 05/27/13. Takes Lantus 24units qd, Novolog 12 units with lunch and 8 units breakfast/dinner for CBG>100. Am 409-811, lunch 65-222, dinner 89-265 Update Hgb  A1c                Unspecified constipation     Stable on colace 100 x2 qhs and Amitiza bid and MiraLax daily                  Unspecified hypothyroidism - Primary (Chronic)     Corrected with Levothyroxine 112.11mcg, last TSH 3.677 01/24/13, updated ZOX0.960 05/27/13-update TSH due to her recently cognition decline and escalated anxiety.                            Review of Systems: Review of Systems  Constitutional: Negative for fever, chills, weight loss, malaise/fatigue and diaphoresis.  HENT: Positive for hearing loss. Negative for congestion, ear discharge and sore throat.   Eyes: Negative for pain, discharge and redness.  Respiratory: Negative for cough, sputum production, shortness of breath and wheezing.   Cardiovascular: Positive for leg swelling and PND. Negative for chest pain, palpitations, orthopnea and claudication.  Gastrointestinal: Negative for heartburn, nausea, vomiting, abdominal pain, diarrhea, constipation and blood in stool.  Genitourinary: Positive for frequency. Negative for dysuria, urgency and flank pain.   Musculoskeletal: Positive for back pain, falls, joint pain and myalgias. Negative for neck pain.       C/o the right shoulder, upper arm, hip pain from fall last night 08/12/13. Able to ambulate with walker.   Skin: Positive for rash (chronic BLE venous dermatitis. Left medial forefoot diabetic foot ulcer). Negative for itching.       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 Intergluteal cleft erythema and superficial skin missing. Left forefoot diabetic ulcer--chronic about a quarter size. Pressure ulcers at R+L buttocks-in mirror image-2x2cm and superficial. Patchy dark color skin changes chronically lower abd, lower back, buttocks, thighs.   Neurological: Positive for tremors. Negative for dizziness, tingling, sensory change, speech change, focal weakness, seizures, loss of consciousness, weakness and headaches.  Endo/Heme/Allergies: Negative for environmental allergies and polydipsia. Does not bruise/bleed easily.  Psychiatric/Behavioral: Positive for depression and memory loss. Negative for hallucinations. The patient is nervous/anxious and has insomnia.        Confusion.      Past Medical History  Diagnosis Date  . Diabetes mellitus   . Hypertension   . Heart murmur   . Benign familial tremor   . Rosacea   . Diverticulitis   . Heart failure     diastolic heart failure  . TIA (transient ischemic attack) 2010  . Arthritis   . Breast cancer     breast -rt  . CAD (coronary artery disease)     single vessel CAD per cath in 2006; scattered nonobstructive disease in the left system, left to right collaterals to the RCA which was occluded by flush shots; she has been managed medically   Past Surgical History  Procedure Laterality Date  . Mastectomy partial / lumpectomy  11/02/2000    Right, with SLN  . Mastectomy partial / lumpectomy  05/15/2008    Left  . Breast lumpectomy  01/19/2010    right  . Mastectomy  03/31/2010    Right   Social History:   reports that she has never smoked. She  does not have any smokeless tobacco history on file. She reports that she does not drink alcohol or use illicit drugs.  Medications: Patient's Medications  New Prescriptions   No medications on file  Previous Medications   ACETAMINOPHEN (MAPAP) 325 MG TABLET  Take 650 mg by mouth 2 (two) times daily.   ALOE-SODIUM CHLORIDE (AYR SALINE NASAL GEL NA)    Place 1 application into the nose 2 (two) times daily. Both nostrils   ASPIRIN 81 MG CHEWABLE TABLET    Chew 81 mg by mouth every morning.    CLONAZEPAM (KLONOPIN) 0.5 MG TABLET    Take 0.5 mg by mouth as needed for anxiety. Take one tab as needed   DOCUSATE SODIUM (COLACE) 100 MG CAPSULE    Take 100 mg by mouth 2 (two) times daily.    DORZOLAMIDE-TIMOLOL (COSOPT) 22.3-6.8 MG/ML OPHTHALMIC SOLUTION    Place 1 drop into the left eye every 12 (twelve) hours.   DULOXETINE (CYMBALTA) 30 MG CAPSULE    Take 30 mg by mouth 2 (two) times daily.   FENTANYL (DURAGESIC - DOSED MCG/HR) 100 MCG/HR    Apply 1 patch every 3 hours. Change site   FUROSEMIDE (LASIX) 40 MG TABLET    Take 40-80 mg by mouth 2 (two) times daily. 80 mg in the am and 40 mg at lunch (2 pm)   HYDROCODONE-ACETAMINOPHEN (NORCO) 10-325 MG PER TABLET    Take 0.5 tablets by mouth every 4 (four) hours as needed. For pain   HYDROXYZINE (ATARAX/VISTARIL) 25 MG TABLET    Take 25 mg by mouth at bedtime as needed. For itching   INSULIN ASPART (NOVOLOG) 100 UNIT/ML INJECTION    Inject 8 Units into the skin 2 (two) times daily. If CBG is greater than 100   INSULIN GLARGINE (LANTUS) 100 UNIT/ML INJECTION    Inject 24 Units into the skin at bedtime.    ISOSORBIDE MONONITRATE (IMDUR) 60 MG 24 HR TABLET    Take 60 mg by mouth every morning.    LEVOTHYROXINE (SYNTHROID, LEVOTHROID) 75 MCG TABLET    Take 75-112.5 mcg by mouth daily. Take 1.5 tablets (112.60mcg) on Saturday. Take 1 tablet (75 mcg) daily all other days of the week   LORATADINE (CLARITIN) 10 MG TABLET    Take 10 mg by mouth as needed for  allergies.   LORAZEPAM (ATIVAN) 0.5 MG TABLET    Take 1 mg by mouth every 12 (twelve) hours as needed. Severe agitation   LOSARTAN (COZAAR) 50 MG TABLET    Take 50 mg by mouth daily.   LUBIPROSTONE (AMITIZA) 24 MCG CAPSULE    Take 24 mcg by mouth 2 (two) times daily with a meal.     MELOXICAM (MOBIC) 15 MG TABLET    Take 15 mg by mouth daily.   MEMANTINE (NAMENDA) 10 MG TABLET    Take 10 mg by mouth 2 (two) times daily.   MULTIPLE VITAMINS-MINERALS (CERTA-VITE-LUTEIN PO)    Take 1 tablet by mouth daily.    NITROGLYCERIN (NITROSTAT) 0.4 MG SL TABLET    Place 0.4 mg under the tongue every 5 (five) minutes as needed. For chest pain   NUTRITIONAL SUPPLEMENTS (RESOURCE ARGINAID PO)    Take 1 packet by mouth 2 (two) times daily.   OMEPRAZOLE (PRILOSEC) 20 MG CAPSULE    Take 20 mg by mouth 2 (two) times daily.   OXYMETAZOLINE (AFRIN) 0.05 % NASAL SPRAY    Place 2 sprays into the nose 2 (two) times daily as needed. For congestion   POLYETHYLENE GLYCOL (MIRALAX / GLYCOLAX) PACKET    Take 17 g by mouth daily.   PRAMOXINE-MINERAL OIL-ZINC (TUCKS) 1-12.5 % RECTAL OINTMENT    Place 1 application rectally as needed for hemorrhoids.   PROBIOTIC PRODUCT (  ALIGN) 4 MG CAPS    Take 1 capsule by mouth daily.   PROPRANOLOL (INDERAL) 20 MG TABLET    Take 20 mg by mouth 2 (two) times daily.   SPIRONOLACTONE (ALDACTONE) 25 MG TABLET    Take 25 mg by mouth 2 (two) times daily.   VITAMIN D, ERGOCALCIFEROL, (DRISDOL) 50000 UNITS CAPS    Take 50,000 Units by mouth every 30 (thirty) days. On the first of the month  Modified Medications   No medications on file  Discontinued Medications   PREDNISONE (DELTASONE) 5 MG TABLET    Take 5 mg by mouth daily.     Physical Exam: Physical Exam  Constitutional: She appears well-developed.  HENT:  Head: Normocephalic and atraumatic.  Left Ear: External ear normal.  Eyes: Conjunctivae and EOM are normal. Pupils are equal, round, and reactive to light.  Neck: Neck supple. No JVD  present. No thyromegaly present.  Cardiovascular: Normal rate and regular rhythm.   Murmur heard. Pulmonary/Chest: Effort normal.  Abdominal: Soft. There is no tenderness. There is no rebound.  Musculoskeletal: She exhibits edema.  The right shoulder/upper arm and hip pain  Lymphadenopathy:    She has no cervical adenopathy.  Neurological: She is alert. No cranial nerve deficit. Coordination normal.  Skin: Skin is warm and dry. Rash noted.     Erythematous and chronic swelling in BLE. Left forefoot diabetic ulcer-larger and deeper in size-wound bed is clean with tunneling(priviously protruding center is flattened and is lower than the skin surface.  Intergluteal cleft skin breakdown with superficial skin missing and erythematous extended to the R+L 1/3 of inner buttocks--new pressure ulcers R+L buttocks near the intergluteal cleft with mirror imaging about a quarter seize stage II presently.    Psychiatric: Her mood appears anxious. Her affect is not blunt, not labile and not inappropriate. Her speech is rapid and/or pressured (occasionally ). Her speech is not delayed and not slurred. She is hyperactive. She is not agitated, not aggressive, not slowed, not withdrawn, not actively hallucinating and not combative. Thought content is not paranoid and not delusional. She does not exhibit a depressed mood. She exhibits abnormal recent memory.  MMSE 21/30 08/2013    Filed Vitals:   08/23/13 1116  BP: 130/76  Pulse: 64  Temp: 98.1 F (36.7 C)  TempSrc: Tympanic  Resp: 18      Labs reviewed: Basic Metabolic Panel:  Recent Labs  13/08/65 1852 01/24/13  05/27/13 07/01/13 07/20/13 1921 08/16/13  NA 139 139  < > 137 136* 136 137  K 4.5 4.6  < > 4.5 4.2 3.9 4.2  CL 98  --   --   --   --  97  --   CO2 29  --   --   --   --  30  --   GLUCOSE 255*  --   --   --   --  147*  --   BUN 47* 41*  < > 44* 47* 46* 41*  CREATININE 0.94 1.1  < > 1.3* 1.1 1.00 1.1  CALCIUM 9.8  --   --   --   --   9.3  --   TSH 1.511 3.68  --  3.96  --   --   --   < > = values in this interval not displayed. Liver Function Tests:  Recent Labs  11/13/12 1852 07/20/13 1921  AST 21 19  ALT 19 12  ALKPHOS 74 62  BILITOT 0.4 0.3  PROT 7.5 7.0  ALBUMIN 3.6 2.8*   CBC:  Recent Labs  11/13/12 1852  04/01/13 07/20/13 1921 08/16/13  WBC 7.8  < > 7.2  7.2 9.3 9.0  NEUTROABS 5.8  --   --  6.5  --   HGB 13.6  < > 14.7 13.8 13.9  HCT 42.4  < > 43 39.7 40  MCV 98.1  --   --  97.1  --   PLT 196  < > 220 299 349  < > = values in this interval not displayed. 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 Bony irregularity and small chip fractures involve the turf of the distal phalanx left second toe. Complete dislocation of the interphalangeal joint of the left middle toe with displacement of the distal phalanx medially and associated with a few small bone densities involving the distal portion proximal phalanx most consistent with chip fractures. The findings may be secondary to osteomyelitis and continued follow up is recommended.   08/13/24 X-ray R hip, R humerus, R shoulder: no acute fracture or lytic destructive lesion  Assessment/Plan Unspecified hypothyroidism Corrected with Levothyroxine 112.33mcg, last TSH 3.677 01/24/13, updated [id number] 05/27/13-update TSH due to her recently cognition decline and escalated anxiety.                       Depression Calling or visiting her husband more frequently at night and  difficulty with personal assistance or staff's redirection. This may be related to her decline in memory. Will better manage her mood with increased Cymbalta 30mg  bid and newly added Namenda. Observe the patient.                     Back pain Chronic, pain is controlled with Fentanyl 132mcg/hr and Norco 10/325 q4hr                      A-fib Rate controlled. Propranolol was  increased to 20mg  bid for better rate control in setting of HTN and essential tremor.         Edema Chronic venous insufficiency and dermatitis. Better with Furosemide 80mg  am and 40mg  pm with  Spironolactone 25mg  bid since 06/27/13--Bun/creat 47/1.12 07/01/13. Chronic LLE is bigger than the right. Dc Prednisone 5mg                           Essential tremor Mild head and hands tremor-not disabling. Takes Propranolol 10mg  bid                             HTN (hypertension) Controlled on Losartan 100mg  daily, monitor blood pressure daily                            Ischemic heart disease Still had angina occasionally, f/u Cardiology, continue with Imdur 60mg  and Prn NTG(last dose 02/12/13)        Type II or unspecified type diabetes mellitus with peripheral circulatory disorders, uncontrolled(250.72) Improved, Hgb A1c from 9.8 02/11/13 to 7.7 05/27/13. Takes Lantus 24units qd, Novolog 12 units with lunch and 8 units breakfast/dinner for CBG>100. Am 147-829, lunch 65-222, dinner 89-265 Update Hgb A1c              Unspecified constipation Stable on colace 100 x2 qhs and Amitiza bid and  MiraLax daily                  Family/ Staff Communication: observe the patient.   Goals of Care: SNF  Labs/tests ordered: Hgb A1c and TSH

## 2013-08-23 NOTE — Assessment & Plan Note (Signed)
Chronic, pain is controlled with Fentanyl 100mcg/hr and Norco 10/325 q4hr                                         

## 2013-08-23 NOTE — Assessment & Plan Note (Signed)
Corrected with Levothyroxine 112.58mcg, last TSH 3.677 01/24/13, updated [id number] 05/27/13-update TSH due to her recently cognition decline and escalated anxiety.

## 2013-08-23 NOTE — Assessment & Plan Note (Signed)
Controlled on Losartan 100mg daily, monitor blood pressure daily                   

## 2013-08-23 NOTE — Assessment & Plan Note (Signed)
Rate controlled. Propranolol was increased to 20mg bid for better rate control in setting of HTN and essential tremor.                                

## 2013-08-26 LAB — HEMOGLOBIN A1C: Hgb A1c MFr Bld: 8.2 % — AB (ref 4.0–6.0)

## 2013-08-30 ENCOUNTER — Non-Acute Institutional Stay (SKILLED_NURSING_FACILITY): Payer: Medicare Other | Admitting: Nurse Practitioner

## 2013-08-30 ENCOUNTER — Encounter: Payer: Self-pay | Admitting: Nurse Practitioner

## 2013-08-30 DIAGNOSIS — E039 Hypothyroidism, unspecified: Secondary | ICD-10-CM

## 2013-08-30 DIAGNOSIS — R609 Edema, unspecified: Secondary | ICD-10-CM

## 2013-08-30 DIAGNOSIS — F329 Major depressive disorder, single episode, unspecified: Secondary | ICD-10-CM

## 2013-08-30 DIAGNOSIS — E1159 Type 2 diabetes mellitus with other circulatory complications: Secondary | ICD-10-CM

## 2013-08-30 DIAGNOSIS — L97509 Non-pressure chronic ulcer of other part of unspecified foot with unspecified severity: Secondary | ICD-10-CM

## 2013-08-30 DIAGNOSIS — M549 Dorsalgia, unspecified: Secondary | ICD-10-CM

## 2013-08-30 DIAGNOSIS — I480 Paroxysmal atrial fibrillation: Secondary | ICD-10-CM

## 2013-08-30 DIAGNOSIS — I4891 Unspecified atrial fibrillation: Secondary | ICD-10-CM

## 2013-08-30 DIAGNOSIS — E11621 Type 2 diabetes mellitus with foot ulcer: Secondary | ICD-10-CM

## 2013-08-30 DIAGNOSIS — E1169 Type 2 diabetes mellitus with other specified complication: Secondary | ICD-10-CM

## 2013-08-30 DIAGNOSIS — M7989 Other specified soft tissue disorders: Secondary | ICD-10-CM

## 2013-08-30 DIAGNOSIS — F0391 Unspecified dementia with behavioral disturbance: Secondary | ICD-10-CM

## 2013-08-30 DIAGNOSIS — I10 Essential (primary) hypertension: Secondary | ICD-10-CM

## 2013-08-30 NOTE — Assessment & Plan Note (Signed)
Calling or visiting her husband more frequently at night and  difficulty with personal assistance or staff's redirection. This may be related to her decline in memory. Better with Cymbalta 30mg bid and newly added Namenda. Observe the patient.                        

## 2013-08-30 NOTE — Assessment & Plan Note (Signed)
Chronic, pain is controlled with Fentanyl 100mcg/hr and Norco 10/325 q4hr                                         

## 2013-08-30 NOTE — Progress Notes (Signed)
Patient ID: [name], female   DOB: 1926-05-20, 79 y.o.   MRN: [id number]  Code Status: DNR  Allergies  Allergen Reactions  . Penicillins Other (See Comments)    Per MAR  . Bee Venom Other (See Comments)    Per MAR  . Celebrex [Celecoxib] Other (See Comments)    Per MAR  . Lidocaine Hcl Other (See Comments)    Per MAR  . Phenobarbital Other (See Comments)    Per MAR  . Procaine Hcl Other (See Comments)    Per mar    Chief Complaint  Patient presents with  . Medical Managment of Chronic Issues    blood sugar    HPI: Patient is a 79 y.o. female seen in the SNF at [facility] today for evaluation blood sugar, blood pressure, mood, memory, behaviros, and other chronic medical conditions.  Problem List Items Addressed This Visit   A-fib     Rate controlled. Propranolol was increased to 20mg  bid for better rate control in setting of HTN and essential tremor.             Back pain     Chronic, pain is controlled with Fentanyl 163mcg/hr and Norco 10/325 q4hr                          Dementia with behavioral disturbance     Staff reported the patient calls or visits her husband frequently at night(she lives in SNF and her husband lives in [place] at [facility]) which interfering his night rest. Also the patient insisted to provide her own personal hygiene that is adequate to maintain skin integrity. Staff reported the patient's agitation when she is redirected--Ativan 1mg  q12hrs prn has not been utilized in Oct 2014.  Her MMSE 21/3010/04/2013. Given her hx of cardiovascular issues--vascular dementia is more likely. Tolerated  Namenda and increase Cymbalta to 30mg  bid. Will obtain CBC and CMP    Depression     Calling or visiting her husband more frequently at night and  difficulty with personal assistance or staff's redirection. This may be related to her decline in memory. Better with Cymbalta 30mg  bid and newly added Namenda. Observe the patient.                          Edema     Chronic venous insufficiency and dermatitis. Better with Furosemide 80mg  am and 40mg  pm with  Spironolactone 25mg  bid since 06/27/13--Bun/creat 47/1.12 07/01/13. Chronic LLE is bigger than the right. Update CMP                             HTN (hypertension)     Low Bp96/58 08/29/13--will decrease Losartan to 25mg  daily, monitor blood pressure daily                                Leg swelling     Chronic venous insufficiency and dermatitis. Better with Furosemide 80mg  am and 40mg  pm with  Spironolactone 25mg  bid since 06/27/13--Bun/creat 47/1.12 07/01/13. Chronic LLE is bigger than the right. Update CMP                              PAF (paroxysmal atrial fibrillation)     Rate controlled. Propranolol was increased to  20mg  bid for better rate control in setting of HTN and essential tremor.             Type II or unspecified type diabetes mellitus with peripheral circulatory disorders, uncontrolled(250.72) (Chronic)     Improved, Hgb A1c from 9.8 02/11/13 to 7.7 05/27/13 to 8.2 08/26/13 Takes Lantus 24units qd, Novolog 12 units with lunch and 8 units breakfast/dinner for CBG>150(noted CBG 51 ac lunch 08/28/13)                  Ulcer of foot due to diabetes mellitus     Non healing.     Unspecified hypothyroidism - Primary (Chronic)     Corrected with Levothyroxine 112.40mcg, last TSH 2.069 08/26/13                             Review of Systems: Review of Systems  Constitutional: Negative for fever, chills, weight loss, malaise/fatigue and diaphoresis.  HENT: Positive for hearing loss. Negative for congestion, ear discharge and sore throat.   Eyes: Negative for pain, discharge and redness.  Respiratory: Negative for cough, sputum production, shortness of breath and wheezing.   Cardiovascular: Positive for leg swelling and  PND. Negative for chest pain, palpitations, orthopnea and claudication.  Gastrointestinal: Negative for heartburn, nausea, vomiting, abdominal pain, diarrhea, constipation and blood in stool.  Genitourinary: Positive for frequency. Negative for dysuria, urgency and flank pain.  Musculoskeletal: Positive for back pain, falls, joint pain and myalgias. Negative for neck pain.       C/o the right shoulder, upper arm, hip pain from fall last night 08/12/13. Able to ambulate with walker.   Skin: Positive for rash (chronic BLE venous dermatitis. Left medial forefoot diabetic foot ulcer). Negative for itching.       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 Intergluteal cleft erythema and superficial skin missing. Left forefoot diabetic ulcer--chronic about a quarter size. Pressure ulcers at R+L buttocks-in mirror image-2x2cm and superficial. Patchy dark color skin changes chronically lower abd, lower back, buttocks, thighs.   Neurological: Positive for tremors. Negative for dizziness, tingling, sensory change, speech change, focal weakness, seizures, loss of consciousness, weakness and headaches.  Endo/Heme/Allergies: Negative for environmental allergies and polydipsia. Does not bruise/bleed easily.  Psychiatric/Behavioral: Positive for depression and memory loss. Negative for hallucinations. The patient is nervous/anxious and has insomnia.        Confusion.      Past Medical History  Diagnosis Date  . Diabetes mellitus   . Hypertension   . Heart murmur   . Benign familial tremor   . Rosacea   . Diverticulitis   . Heart failure     diastolic heart failure  . TIA (transient ischemic attack) 2010  . Arthritis   . Breast cancer     breast -rt  . CAD (coronary artery disease)     single vessel CAD per cath in 2006; scattered nonobstructive disease in the left system, left to right collaterals to the RCA which was occluded by flush shots; she has been managed medically   Past Surgical History  Procedure  Laterality Date  . Mastectomy partial / lumpectomy  11/02/2000    Right, with SLN  . Mastectomy partial / lumpectomy  05/15/2008    Left  . Breast lumpectomy  01/19/2010    right  . Mastectomy  03/31/2010    Right   Social History:   reports that she has never smoked. She does not have  any smokeless tobacco history on file. She reports that she does not drink alcohol or use illicit drugs.  Medications: Patient's Medications  New Prescriptions   No medications on file  Previous Medications   ACETAMINOPHEN (MAPAP) 325 MG TABLET    Take 650 mg by mouth 2 (two) times daily.   ALOE-SODIUM CHLORIDE (AYR SALINE NASAL GEL NA)    Place 1 application into the nose 2 (two) times daily. Both nostrils   ASPIRIN 81 MG CHEWABLE TABLET    Chew 81 mg by mouth every morning.    CLONAZEPAM (KLONOPIN) 0.5 MG TABLET    Take 0.5 mg by mouth as needed for anxiety. Take one tab as needed   DOCUSATE SODIUM (COLACE) 100 MG CAPSULE    Take 100 mg by mouth 2 (two) times daily.    DORZOLAMIDE-TIMOLOL (COSOPT) 22.3-6.8 MG/ML OPHTHALMIC SOLUTION    Place 1 drop into the left eye every 12 (twelve) hours.   DULOXETINE (CYMBALTA) 30 MG CAPSULE    Take 30 mg by mouth 2 (two) times daily.   FENTANYL (DURAGESIC - DOSED MCG/HR) 100 MCG/HR    Apply 1 patch every 3 hours. Change site   FUROSEMIDE (LASIX) 40 MG TABLET    Take 40-80 mg by mouth 2 (two) times daily. 80 mg in the am and 40 mg at lunch (2 pm)   HYDROCODONE-ACETAMINOPHEN (NORCO) 10-325 MG PER TABLET    Take 0.5 tablets by mouth every 4 (four) hours as needed. For pain   HYDROXYZINE (ATARAX/VISTARIL) 25 MG TABLET    Take 25 mg by mouth at bedtime as needed. For itching   INSULIN ASPART (NOVOLOG) 100 UNIT/ML INJECTION    Inject 8 Units into the skin 2 (two) times daily. If CBG is greater than 150   INSULIN GLARGINE (LANTUS) 100 UNIT/ML INJECTION    Inject 24 Units into the skin at bedtime.    ISOSORBIDE MONONITRATE (IMDUR) 60 MG 24 HR TABLET    Take 60 mg by mouth every  morning.    LEVOTHYROXINE (SYNTHROID, LEVOTHROID) 75 MCG TABLET    Take 75-112.5 mcg by mouth daily. Take 1.5 tablets (112.30mcg) on Saturday. Take 1 tablet (75 mcg) daily all other days of the week   LORATADINE (CLARITIN) 10 MG TABLET    Take 10 mg by mouth as needed for allergies.   LORAZEPAM (ATIVAN) 0.5 MG TABLET    Take 1 mg by mouth every 12 (twelve) hours as needed. Severe agitation   LOSARTAN (COZAAR) 50 MG TABLET    Take 50 mg by mouth daily.   LUBIPROSTONE (AMITIZA) 24 MCG CAPSULE    Take 24 mcg by mouth 2 (two) times daily with a meal.     MELOXICAM (MOBIC) 15 MG TABLET    Take 15 mg by mouth daily.   MEMANTINE (NAMENDA) 10 MG TABLET    Take 10 mg by mouth 2 (two) times daily.   MULTIPLE VITAMINS-MINERALS (CERTA-VITE-LUTEIN PO)    Take 1 tablet by mouth daily.    NITROGLYCERIN (NITROSTAT) 0.4 MG SL TABLET    Place 0.4 mg under the tongue every 5 (five) minutes as needed. For chest pain   NUTRITIONAL SUPPLEMENTS (RESOURCE ARGINAID PO)    Take 1 packet by mouth 2 (two) times daily.   OMEPRAZOLE (PRILOSEC) 20 MG CAPSULE    Take 20 mg by mouth 2 (two) times daily.   OXYMETAZOLINE (AFRIN) 0.05 % NASAL SPRAY    Place 2 sprays into the nose 2 (two) times daily as  needed. For congestion   POLYETHYLENE GLYCOL (MIRALAX / GLYCOLAX) PACKET    Take 17 g by mouth daily.   PRAMOXINE-MINERAL OIL-ZINC (TUCKS) 1-12.5 % RECTAL OINTMENT    Place 1 application rectally as needed for hemorrhoids.   PROBIOTIC PRODUCT (ALIGN) 4 MG CAPS    Take 1 capsule by mouth daily.   PROPRANOLOL (INDERAL) 20 MG TABLET    Take 20 mg by mouth 2 (two) times daily.   SPIRONOLACTONE (ALDACTONE) 25 MG TABLET    Take 25 mg by mouth 2 (two) times daily.   VITAMIN D, ERGOCALCIFEROL, (DRISDOL) 50000 UNITS CAPS    Take 50,000 Units by mouth every 30 (thirty) days. On the first of the month  Modified Medications   No medications on file  Discontinued Medications   No medications on file     Physical Exam: Physical Exam   Constitutional: She appears well-developed.  HENT:  Head: Normocephalic and atraumatic.  Left Ear: External ear normal.  Eyes: Conjunctivae and EOM are normal. Pupils are equal, round, and reactive to light.  Neck: Neck supple. No JVD present. No thyromegaly present.  Cardiovascular: Normal rate and regular rhythm.   Murmur heard. Pulmonary/Chest: Effort normal.  Abdominal: Soft. There is no tenderness. There is no rebound.  Musculoskeletal: She exhibits edema.  The right shoulder/upper arm and hip pain  Lymphadenopathy:    She has no cervical adenopathy.  Neurological: She is alert. No cranial nerve deficit. Coordination normal.  Skin: Skin is warm and dry. Rash noted.     Erythematous and chronic swelling in BLE. Left forefoot diabetic ulcer-larger and deeper in size-wound bed is clean with tunneling(priviously protruding center is flattened and is lower than the skin surface.  Intergluteal cleft skin breakdown with superficial skin missing and erythematous extended to the R+L 1/3 of inner buttocks--new pressure ulcers R+L buttocks near the intergluteal cleft with mirror imaging about a quarter seize stage II presently.    Psychiatric: Her mood appears anxious. Her affect is not blunt, not labile and not inappropriate. Her speech is rapid and/or pressured (occasionally ). Her speech is not delayed and not slurred. She is hyperactive. She is not agitated, not aggressive, not slowed, not withdrawn, not actively hallucinating and not combative. Thought content is not paranoid and not delusional. She does not exhibit a depressed mood. She exhibits abnormal recent memory.  MMSE 21/30 08/2013    Filed Vitals:   08/30/13 1403  BP: 96/58  Pulse: 89  Temp: 98.1 F (36.7 C)  TempSrc: Tympanic  Resp: 18      Labs reviewed: Basic Metabolic Panel:  Recent Labs  40/98/11 1852 01/24/13  05/27/13 07/01/13 07/20/13 1921 08/16/13  NA 139 139  < > 137 136* 136 137  K 4.5 4.6  < > 4.5 4.2  3.9 4.2  CL 98  --   --   --   --  97  --   CO2 29  --   --   --   --  30  --   GLUCOSE 255*  --   --   --   --  147*  --   BUN 47* 41*  < > 44* 47* 46* 41*  CREATININE 0.94 1.1  < > 1.3* 1.1 1.00 1.1  CALCIUM 9.8  --   --   --   --  9.3  --   TSH 1.511 3.68  --  3.96  --   --   --   < > =  values in this interval not displayed. Liver Function Tests:  Recent Labs  11/13/12 1852 07/20/13 1921  AST 21 19  ALT 19 12  ALKPHOS 74 62  BILITOT 0.4 0.3  PROT 7.5 7.0  ALBUMIN 3.6 2.8*   CBC:  Recent Labs  11/13/12 1852  04/01/13 07/20/13 1921 08/16/13  WBC 7.8  < > 7.2  7.2 9.3 9.0  NEUTROABS 5.8  --   --  6.5  --   HGB 13.6  < > 14.7 13.8 13.9  HCT 42.4  < > 43 39.7 40  MCV 98.1  --   --  97.1  --   PLT 196  < > 220 299 349  < > = values in this interval not displayed. 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 Bony irregularity and small chip fractures involve the turf of the distal phalanx left second toe. Complete dislocation of the interphalangeal joint of the left middle toe with displacement of the distal phalanx medially and associated with a few small bone densities involving the distal portion proximal phalanx most consistent with chip fractures. The findings may be secondary to osteomyelitis and continued follow up is recommended.   08/13/24 X-ray R hip, R humerus, R shoulder: no acute fracture or lytic destructive lesion  Assessment/Plan Unspecified hypothyroidism Corrected with Levothyroxine 112.3mcg, last TSH 2.069 08/26/13                        Ulcer of foot due to diabetes mellitus Non healing.   Type II or unspecified type diabetes mellitus with peripheral circulatory disorders, uncontrolled(250.72) Improved, Hgb A1c from 9.8 02/11/13 to 7.7 05/27/13 to 8.2 08/26/13 Takes Lantus 24units qd, Novolog 12 units with lunch and 8 units breakfast/dinner for CBG>150(noted CBG 51 ac lunch  08/28/13)                PAF (paroxysmal atrial fibrillation) Rate controlled. Propranolol was increased to 20mg  bid for better rate control in setting of HTN and essential tremor.           Leg swelling Chronic venous insufficiency and dermatitis. Better with Furosemide 80mg  am and 40mg  pm with  Spironolactone 25mg  bid since 06/27/13--Bun/creat 47/1.12 07/01/13. Chronic LLE is bigger than the right. Update CMP                            HTN (hypertension) Low Bp96/58 08/29/13--will decrease Losartan to 25mg  daily, monitor blood pressure daily                              Edema Chronic venous insufficiency and dermatitis. Better with Furosemide 80mg  am and 40mg  pm with  Spironolactone 25mg  bid since 06/27/13--Bun/creat 47/1.12 07/01/13. Chronic LLE is bigger than the right. Update CMP                           Depression Calling or visiting her husband more frequently at night and  difficulty with personal assistance or staff's redirection. This may be related to her decline in memory. Better with Cymbalta 30mg  bid and newly added Namenda. Observe the patient.                       Dementia with behavioral disturbance Staff reported the patient calls or visits  her husband frequently at night(she lives in SNF and her husband lives in [place] at [facility]) which interfering his night rest. Also the patient insisted to provide her own personal hygiene that is adequate to maintain skin integrity. Staff reported the patient's agitation when she is redirected--Ativan 1mg  q12hrs prn has not been utilized in Oct 2014.  Her MMSE 21/3010/04/2013. Given her hx of cardiovascular issues--vascular dementia is more likely. Tolerated  Namenda and increase Cymbalta to 30mg  bid. Will obtain CBC and CMP  Back pain Chronic, pain is controlled with Fentanyl 16mcg/hr and Norco 10/325  q4hr                        A-fib Rate controlled. Propranolol was increased to 20mg  bid for better rate control in setting of HTN and essential tremor.             Family/ Staff Communication: observe the patient.   Goals of Care: SNF  Labs/tests ordered: CBC and CMP

## 2013-08-30 NOTE — Assessment & Plan Note (Signed)
Chronic venous insufficiency and dermatitis. Better with Furosemide 80mg  am and 40mg  pm with  Spironolactone 25mg  bid since 06/27/13--Bun/creat 47/1.12 07/01/13. Chronic LLE is bigger than the right. Update CMP

## 2013-08-30 NOTE — Assessment & Plan Note (Addendum)
Staff reported the patient calls or visits her husband frequently at night(she lives in SNF and her husband lives in [place] at [facility]) which interfering his night rest. Also the patient insisted to provide her own personal hygiene that is adequate to maintain skin integrity. Staff reported the patient's agitation when she is redirected--Ativan 1mg  q12hrs prn has not been utilized in Oct 2014.  Her MMSE 21/3010/04/2013. Given her hx of cardiovascular issues--vascular dementia is more likely. Tolerated  Namenda and increase Cymbalta to 30mg  bid. Will obtain CBC and CMP

## 2013-08-30 NOTE — Assessment & Plan Note (Signed)
Low Bp96/58 08/29/13--will decrease Losartan to 25mg  daily, monitor blood pressure daily

## 2013-08-30 NOTE — Assessment & Plan Note (Signed)
Improved, Hgb A1c from 9.8 02/11/13 to 7.7 05/27/13 to 8.2 08/26/13 Takes Lantus 24units qd, Novolog 12 units with lunch and 8 units breakfast/dinner for CBG>150(noted CBG 51 ac lunch 08/28/13)

## 2013-08-30 NOTE — Assessment & Plan Note (Signed)
Chronic venous insufficiency and dermatitis. Better with Furosemide 80mg am and 40mg pm with  Spironolactone 25mg bid since 06/27/13--Bun/creat 47/1.12 07/01/13. Chronic LLE is bigger than the right. Update CMP                          

## 2013-08-30 NOTE — Assessment & Plan Note (Signed)
Rate controlled. Propranolol was increased to 20mg bid for better rate control in setting of HTN and essential tremor.                                

## 2013-08-30 NOTE — Assessment & Plan Note (Signed)
Corrected with Levothyroxine 112.5mcg, last TSH 2.069 08/26/13    

## 2013-08-30 NOTE — Assessment & Plan Note (Signed)
Non healing.

## 2013-08-31 LAB — BASIC METABOLIC PANEL
BUN: 40 mg/dL — AB (ref 4–21)
Creatinine: 1 mg/dL (ref 0.5–1.1)
Sodium: 136 mmol/L — AB (ref 137–147)

## 2013-08-31 LAB — HEPATIC FUNCTION PANEL
ALT: 13 U/L (ref 7–35)
Alkaline Phosphatase: 82 U/L (ref 25–125)

## 2013-08-31 LAB — CBC AND DIFFERENTIAL: Platelets: 295 10*3/uL (ref 150–399)

## 2013-09-10 ENCOUNTER — Non-Acute Institutional Stay (SKILLED_NURSING_FACILITY): Payer: Medicare Other | Admitting: Nurse Practitioner

## 2013-09-10 ENCOUNTER — Encounter: Payer: Self-pay | Admitting: Nurse Practitioner

## 2013-09-10 DIAGNOSIS — F329 Major depressive disorder, single episode, unspecified: Secondary | ICD-10-CM

## 2013-09-10 DIAGNOSIS — F0391 Unspecified dementia with behavioral disturbance: Secondary | ICD-10-CM

## 2013-09-10 DIAGNOSIS — I10 Essential (primary) hypertension: Secondary | ICD-10-CM

## 2013-09-10 DIAGNOSIS — F32A Depression, unspecified: Secondary | ICD-10-CM

## 2013-09-10 DIAGNOSIS — R609 Edema, unspecified: Secondary | ICD-10-CM

## 2013-09-10 DIAGNOSIS — E1159 Type 2 diabetes mellitus with other circulatory complications: Secondary | ICD-10-CM

## 2013-09-10 DIAGNOSIS — M549 Dorsalgia, unspecified: Secondary | ICD-10-CM

## 2013-09-10 DIAGNOSIS — E11621 Type 2 diabetes mellitus with foot ulcer: Secondary | ICD-10-CM

## 2013-09-10 DIAGNOSIS — F03918 Unspecified dementia, unspecified severity, with other behavioral disturbance: Secondary | ICD-10-CM

## 2013-09-10 DIAGNOSIS — L97509 Non-pressure chronic ulcer of other part of unspecified foot with unspecified severity: Secondary | ICD-10-CM

## 2013-09-10 DIAGNOSIS — B372 Candidiasis of skin and nail: Secondary | ICD-10-CM | POA: Insufficient documentation

## 2013-09-10 DIAGNOSIS — K5900 Constipation, unspecified: Secondary | ICD-10-CM

## 2013-09-10 DIAGNOSIS — E039 Hypothyroidism, unspecified: Secondary | ICD-10-CM

## 2013-09-10 DIAGNOSIS — E1169 Type 2 diabetes mellitus with other specified complication: Secondary | ICD-10-CM

## 2013-09-10 DIAGNOSIS — I4891 Unspecified atrial fibrillation: Secondary | ICD-10-CM

## 2013-09-10 DIAGNOSIS — G250 Essential tremor: Secondary | ICD-10-CM

## 2013-09-10 NOTE — Assessment & Plan Note (Addendum)
Chronic venous insufficiency and dermatitis. Better with Furosemide 80mg  am and 40mg  pm with  Spironolactone 25mg  bid since 06/27/13--Bun/creat 47/1.12 07/01/13. Chronic LLE is bigger than the right. Update BMP

## 2013-09-10 NOTE — Assessment & Plan Note (Signed)
Corrected with Levothyroxine 112.5mcg, last TSH 2.069 08/26/13    

## 2013-09-10 NOTE — Assessment & Plan Note (Signed)
Controlled after Losartan was decreased to 25mg daily, monitor blood pressure daily.                                                   

## 2013-09-10 NOTE — Assessment & Plan Note (Signed)
Stable on colace 100 x2 qhs and Amitiza 24mcg bid and MiraLax daily             

## 2013-09-10 NOTE — Progress Notes (Signed)
Patient ID: [name], female   DOB: Oct 12, 1926, 79 y.o.   MRN: [id number]  Code Status: DNR  Allergies  Allergen Reactions  . Penicillins Other (See Comments)    Per MAR  . Bee Venom Other (See Comments)    Per MAR  . Celebrex [Celecoxib] Other (See Comments)    Per MAR  . Lidocaine Hcl Other (See Comments)    Per MAR  . Phenobarbital Other (See Comments)    Per MAR  . Procaine Hcl Other (See Comments)    Per mar    Chief Complaint  Patient presents with  . Acute Visit    excoriated buttocks, sattlite pattern rash scattered thighs/abd/lower back  . Medical Managment of Chronic Issues    HPI: Patient is a 79 y.o. female seen in the SNF at [facility] today for evaluation blood sugar, blood pressure, mood, memory, behaviros, and other chronic medical conditions.  Problem List Items Addressed This Visit   A-fib - Primary     Rate controlled. Propranolol was increased to 20mg  bid for better rate control in setting of HTN and essential tremor.               Back pain     Chronic, pain is controlled with Fentanyl 173mcg/hr and Norco 10/325 q4hr                            Candidiasis of skin     Buttocks-beefy red and excoriated which extends to thighs, abd, lower back with satellite pattern--Diflucan 100mg  po daily x3, apply Clobetasol topical to affected area nightly and Silvadene cream qam. Observe.     Dementia with behavioral disturbance     Staff reported the patient calls or visits her husband frequently at night(she lives in SNF and her husband lives in [place] at [facility]) which interfering his night rest. Also the patient insisted to provide her own personal hygiene that is adequate to maintain skin integrity. Staff reported the patient's agitation when she is redirected--Ativan 1mg  q12hrs prn has not been utilized in Oct 2014.  Her MMSE 21/3010/04/2013. Given her hx of cardiovascular issues--vascular dementia is more likely. Tolerated  Namenda  and increased Cymbalta to 30mg  bid      Depression     Calling or visiting her husband more frequently at night and  difficulty with personal assistance or staff's redirection. This may be related to her decline in memory. Better with Cymbalta 30mg  bid and newly added Namenda. Observe the patient.                           Diabetic foot ulcer     Left forefoot-stable--non healing.     Edema     Chronic venous insufficiency and dermatitis. Better with Furosemide 80mg  am and 40mg  pm with  Spironolactone 25mg  bid since 06/27/13--Bun/creat 47/1.12 07/01/13. Chronic LLE is bigger than the right. Update BMP                               Essential tremor     Mild head and hands tremor-not disabling. Takes Propranolol 10mg  bid                                 HTN (hypertension)     Controlled after Losartan  was decreased to 25mg  daily, monitor blood pressure daily                                  Type II or unspecified type diabetes mellitus with peripheral circulatory disorders, uncontrolled(250.72) (Chronic)     CBG range 95-262. No change in tx.     Unspecified constipation     Stable on colace 100 x2 qhs and Amitiza bid and MiraLax daily                    Unspecified hypothyroidism (Chronic)     Corrected with Levothyroxine 112.70mcg, last TSH 2.069 08/26/13                               Review of Systems: Review of Systems  Constitutional: Negative for fever, chills, weight loss, malaise/fatigue and diaphoresis.  HENT: Positive for hearing loss. Negative for congestion, ear discharge and sore throat.   Eyes: Negative for pain, discharge and redness.  Respiratory: Negative for cough, sputum production, shortness of breath and wheezing.   Cardiovascular: Positive for leg swelling and PND. Negative for chest pain, palpitations, orthopnea  and claudication.  Gastrointestinal: Negative for heartburn, nausea, vomiting, abdominal pain, diarrhea, constipation and blood in stool.  Genitourinary: Positive for frequency. Negative for dysuria, urgency and flank pain.  Musculoskeletal: Positive for back pain, falls, joint pain and myalgias. Negative for neck pain.       C/o the right shoulder, upper arm, hip pain from fall last night 08/12/13. Able to ambulate with walker.   Skin: Positive for rash (chronic BLE venous dermatitis. Left medial forefoot diabetic foot ulcer). Negative for itching.       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 Intergluteal cleft erythema and superficial skin missing-worse-beefy red extends to thighs/abd/lower back.  Left forefoot diabetic ulcer--chronic about a quarter size. Pressure ulcers at R+L buttocks-in mirror image-2x2cm and superficial.   Neurological: Positive for tremors. Negative for dizziness, tingling, sensory change, speech change, focal weakness, seizures, loss of consciousness, weakness and headaches.  Endo/Heme/Allergies: Negative for environmental allergies and polydipsia. Does not bruise/bleed easily.  Psychiatric/Behavioral: Positive for depression and memory loss. Negative for hallucinations. The patient is nervous/anxious and has insomnia.        Confusion.      Past Medical History  Diagnosis Date  . Diabetes mellitus   . Hypertension   . Heart murmur   . Benign familial tremor   . Rosacea   . Diverticulitis   . Heart failure     diastolic heart failure  . TIA (transient ischemic attack) 2010  . Arthritis   . Breast cancer     breast -rt  . CAD (coronary artery disease)     single vessel CAD per cath in 2006; scattered nonobstructive disease in the left system, left to right collaterals to the RCA which was occluded by flush shots; she has been managed medically   Past Surgical History  Procedure Laterality Date  . Mastectomy partial / lumpectomy  11/02/2000    Right, with SLN  .  Mastectomy partial / lumpectomy  05/15/2008    Left  . Breast lumpectomy  01/19/2010    right  . Mastectomy  03/31/2010    Right   Social History:   reports that she has never smoked. She does not have any smokeless tobacco history on file.  She reports that she does not drink alcohol or use illicit drugs.  Medications: Patient's Medications  New Prescriptions   No medications on file  Previous Medications   ACETAMINOPHEN (MAPAP) 325 MG TABLET    Take 650 mg by mouth 2 (two) times daily.   ALOE-SODIUM CHLORIDE (AYR SALINE NASAL GEL NA)    Place 1 application into the nose 2 (two) times daily. Both nostrils   ASPIRIN 81 MG CHEWABLE TABLET    Chew 81 mg by mouth every morning.    CLONAZEPAM (KLONOPIN) 0.5 MG TABLET    Take 0.5 mg by mouth as needed for anxiety. Take one tab as needed   DOCUSATE SODIUM (COLACE) 100 MG CAPSULE    Take 100 mg by mouth 2 (two) times daily.    DORZOLAMIDE-TIMOLOL (COSOPT) 22.3-6.8 MG/ML OPHTHALMIC SOLUTION    Place 1 drop into the left eye every 12 (twelve) hours.   DULOXETINE (CYMBALTA) 30 MG CAPSULE    Take 30 mg by mouth 2 (two) times daily.   FENTANYL (DURAGESIC - DOSED MCG/HR) 100 MCG/HR    Apply 1 patch every 3 hours. Change site   FUROSEMIDE (LASIX) 40 MG TABLET    Take 40-80 mg by mouth 2 (two) times daily. 80 mg in the am and 40 mg at lunch (2 pm)   HYDROCODONE-ACETAMINOPHEN (NORCO) 10-325 MG PER TABLET    Take 0.5 tablets by mouth every 4 (four) hours as needed. For pain   HYDROXYZINE (ATARAX/VISTARIL) 25 MG TABLET    Take 25 mg by mouth at bedtime as needed. For itching   INSULIN ASPART (NOVOLOG) 100 UNIT/ML INJECTION    Inject 8 Units into the skin 2 (two) times daily. If CBG is greater than 150   INSULIN GLARGINE (LANTUS) 100 UNIT/ML INJECTION    Inject 24 Units into the skin at bedtime.    ISOSORBIDE MONONITRATE (IMDUR) 60 MG 24 HR TABLET    Take 60 mg by mouth every morning.    LEVOTHYROXINE (SYNTHROID, LEVOTHROID) 75 MCG TABLET    Take 75-112.5 mcg by  mouth daily. Take 1.5 tablets (112.55mcg) on Saturday. Take 1 tablet (75 mcg) daily all other days of the week   LORATADINE (CLARITIN) 10 MG TABLET    Take 10 mg by mouth as needed for allergies.   LORAZEPAM (ATIVAN) 0.5 MG TABLET    Take 1 mg by mouth every 12 (twelve) hours as needed. Severe agitation   LOSARTAN (COZAAR) 50 MG TABLET    Take 50 mg by mouth daily.   LUBIPROSTONE (AMITIZA) 24 MCG CAPSULE    Take 24 mcg by mouth 2 (two) times daily with a meal.     MELOXICAM (MOBIC) 15 MG TABLET    Take 15 mg by mouth daily.   MEMANTINE (NAMENDA) 10 MG TABLET    Take 10 mg by mouth 2 (two) times daily.   MULTIPLE VITAMINS-MINERALS (CERTA-VITE-LUTEIN PO)    Take 1 tablet by mouth daily.    NITROGLYCERIN (NITROSTAT) 0.4 MG SL TABLET    Place 0.4 mg under the tongue every 5 (five) minutes as needed. For chest pain   NUTRITIONAL SUPPLEMENTS (RESOURCE ARGINAID PO)    Take 1 packet by mouth 2 (two) times daily.   OMEPRAZOLE (PRILOSEC) 20 MG CAPSULE    Take 20 mg by mouth 2 (two) times daily.   OXYMETAZOLINE (AFRIN) 0.05 % NASAL SPRAY    Place 2 sprays into the nose 2 (two) times daily as needed. For congestion   POLYETHYLENE  GLYCOL (MIRALAX / GLYCOLAX) PACKET    Take 17 g by mouth daily.   PRAMOXINE-MINERAL OIL-ZINC (TUCKS) 1-12.5 % RECTAL OINTMENT    Place 1 application rectally as needed for hemorrhoids.   PROBIOTIC PRODUCT (ALIGN) 4 MG CAPS    Take 1 capsule by mouth daily.   PROPRANOLOL (INDERAL) 20 MG TABLET    Take 20 mg by mouth 2 (two) times daily.   SPIRONOLACTONE (ALDACTONE) 25 MG TABLET    Take 25 mg by mouth 2 (two) times daily.   VITAMIN D, ERGOCALCIFEROL, (DRISDOL) 50000 UNITS CAPS    Take 50,000 Units by mouth every 30 (thirty) days. On the first of the month  Modified Medications   No medications on file  Discontinued Medications   No medications on file     Physical Exam: Physical Exam  Constitutional: She appears well-developed.  HENT:  Head: Normocephalic and atraumatic.   Left Ear: External ear normal.  Eyes: Conjunctivae and EOM are normal. Pupils are equal, round, and reactive to light.  Neck: Neck supple. No JVD present. No thyromegaly present.  Cardiovascular: Normal rate and regular rhythm.   Murmur heard. Pulmonary/Chest: Effort normal.  Abdominal: Soft. There is no tenderness. There is no rebound.  Musculoskeletal: She exhibits edema.  The right shoulder/upper arm and hip pain  Lymphadenopathy:    She has no cervical adenopathy.  Neurological: She is alert. No cranial nerve deficit. Coordination normal.  Skin: Skin is warm and dry. Rash noted.  Erythematous and chronic swelling in BLE. Left forefoot diabetic ulcer-larger and deeper in size-wound bed is clean with tunneling(priviously protruding center is flattened and is lower than the skin surface.  Intergluteal cleft skin breakdown with superficial skin missing and erythematous extended to the R+L 1/3 of inner buttocks--new pressure ulcers R+L buttocks near the intergluteal cleft with mirror imaging about a quarter seize stage II presently.  Beefy red buttocks extends to abd, thighs, lower back with satellite pattern and excoriation.   Psychiatric: Her mood appears anxious. Her affect is not blunt, not labile and not inappropriate. Her speech is rapid and/or pressured (occasionally ). Her speech is not delayed and not slurred. She is hyperactive. She is not agitated, not aggressive, not slowed, not withdrawn, not actively hallucinating and not combative. Thought content is not paranoid and not delusional. She does not exhibit a depressed mood. She exhibits abnormal recent memory.  MMSE 21/30 08/2013    Filed Vitals:   09/10/13 1128  BP: 150/88  Pulse: 70  Temp: 97.6 F (36.4 C)  TempSrc: Tympanic  Resp: 20      Labs reviewed: Basic Metabolic Panel:  Recent Labs  96/04/54 1852 01/24/13  05/27/13  07/20/13 1921 08/16/13 08/26/13 08/31/13  NA 139 139  < > 137  < > 136 137  --  136*  K 4.5  4.6  < > 4.5  < > 3.9 4.2  --  4.7  CL 98  --   --   --   --  97  --   --   --   CO2 29  --   --   --   --  30  --   --   --   GLUCOSE 255*  --   --   --   --  147*  --   --   --   BUN 47* 41*  < > 44*  < > 46* 41*  --  40*  CREATININE 0.94 1.1  < > 1.3*  < >  1.00 1.1  --  1.0  CALCIUM 9.8  --   --   --   --  9.3  --   --   --   TSH 1.511 3.68  --  3.96  --   --   --  2.07  --   < > = values in this interval not displayed. Liver Function Tests:  Recent Labs  11/13/12 1852 07/20/13 1921 08/31/13  AST 21 19 20   ALT 19 12 13   ALKPHOS 74 62 82  BILITOT 0.4 0.3  --   PROT 7.5 7.0  --   ALBUMIN 3.6 2.8*  --    CBC:  Recent Labs  11/13/12 1852  07/20/13 1921 08/16/13 08/31/13  WBC 7.8  < > 9.3 9.0 8.5  NEUTROABS 5.8  --  6.5  --   --   HGB 13.6  < > 13.8 13.9 14.9  HCT 42.4  < > 39.7 40 44  MCV 98.1  --  97.1  --   --   PLT 196  < > 299 349 295  < > = values in this interval not displayed. 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 Bony irregularity and small chip fractures involve the turf of the distal phalanx left second toe. Complete dislocation of the interphalangeal joint of the left middle toe with displacement of the distal phalanx medially and associated with a few small bone densities involving the distal portion proximal phalanx most consistent with chip fractures. The findings may be secondary to osteomyelitis and continued follow up is recommended.   08/13/24 X-ray R hip, R humerus, R shoulder: no acute fracture or lytic destructive lesion  Assessment/Plan A-fib Rate controlled. Propranolol was increased to 20mg  bid for better rate control in setting of HTN and essential tremor.             Back pain Chronic, pain is controlled with Fentanyl 12mcg/hr and Norco 10/325 q4hr                          Dementia with behavioral disturbance Staff reported the patient calls or visits her  husband frequently at night(she lives in SNF and her husband lives in [place] at [facility]) which interfering his night rest. Also the patient insisted to provide her own personal hygiene that is adequate to maintain skin integrity. Staff reported the patient's agitation when she is redirected--Ativan 1mg  q12hrs prn has not been utilized in Oct 2014.  Her MMSE 21/3010/04/2013. Given her hx of cardiovascular issues--vascular dementia is more likely. Tolerated  Namenda and increased Cymbalta to 30mg  bid    Depression Calling or visiting her husband more frequently at night and  difficulty with personal assistance or staff's redirection. This may be related to her decline in memory. Better with Cymbalta 30mg  bid and newly added Namenda. Observe the patient.                         Diabetic foot ulcer Left forefoot-stable--non healing.   Edema Chronic venous insufficiency and dermatitis. Better with Furosemide 80mg  am and 40mg  pm with  Spironolactone 25mg  bid since 06/27/13--Bun/creat 47/1.12 07/01/13. Chronic LLE is bigger than the right. Update BMP                             Essential tremor Mild head and hands tremor-not  disabling. Takes Propranolol 10mg  bid                               HTN (hypertension) Controlled after Losartan was decreased to 25mg  daily, monitor blood pressure daily                                Type II or unspecified type diabetes mellitus with peripheral circulatory disorders, uncontrolled(250.72) CBG range 95-262. No change in tx.   Unspecified constipation Stable on colace 100 x2 qhs and Amitiza bid and MiraLax daily                  Unspecified hypothyroidism Corrected with Levothyroxine 112.43mcg, last TSH 2.069 08/26/13                          Candidiasis of skin Buttocks-beefy red and excoriated which extends to  thighs, abd, lower back with satellite pattern--Diflucan 100mg  po daily x3, apply Clobetasol topical to affected area nightly and Silvadene cream qam. Observe.     Family/ Staff Communication: observe the patient.   Goals of Care: SNF  Labs/tests ordered: BMP

## 2013-09-10 NOTE — Assessment & Plan Note (Signed)
Chronic, pain is controlled with Fentanyl 100mcg/hr and Norco 10/325 q4hr                                         

## 2013-09-10 NOTE — Assessment & Plan Note (Signed)
CBG range 95-262. No change in tx.      

## 2013-09-10 NOTE — Assessment & Plan Note (Signed)
Rate controlled. Propranolol was increased to 20mg bid for better rate control in setting of HTN and essential tremor.                                

## 2013-09-10 NOTE — Assessment & Plan Note (Signed)
Buttocks-beefy red and excoriated which extends to thighs, abd, lower back with satellite pattern--Diflucan 100mg  po daily x3, apply Clobetasol topical to affected area nightly and Silvadene cream qam. Observe.

## 2013-09-10 NOTE — Assessment & Plan Note (Signed)
Left forefoot-stable--non healing.  

## 2013-09-10 NOTE — Assessment & Plan Note (Signed)
Staff reported the patient calls or visits her husband frequently at night(she lives in SNF and her husband lives in [place] at [facility]) which interfering his night rest. Also the patient insisted to provide her own personal hygiene that is adequate to maintain skin integrity. Staff reported the patient's agitation when she is redirected--Ativan 1mg  q12hrs prn has not been utilized in Oct 2014.  Her MMSE 21/3010/04/2013. Given her hx of cardiovascular issues--vascular dementia is more likely. Tolerated  Namenda and increased Cymbalta to 30mg  bid

## 2013-09-10 NOTE — Assessment & Plan Note (Signed)
Calling or visiting her husband more frequently at night and  difficulty with personal assistance or staff's redirection. This may be related to her decline in memory. Better with Cymbalta 30mg  bid and newly added Namenda. Observe the patient.

## 2013-09-10 NOTE — Assessment & Plan Note (Signed)
Mild head and hands tremor-not disabling. Takes Propranolol 10mg bid                                              

## 2013-09-11 LAB — BASIC METABOLIC PANEL
BUN: 40 mg/dL — AB (ref 4–21)
Creatinine: 1.1 mg/dL (ref 0.5–1.1)
Potassium: 5 mmol/L (ref 3.4–5.3)
Sodium: 137 mmol/L (ref 137–147)

## 2013-09-13 ENCOUNTER — Non-Acute Institutional Stay (SKILLED_NURSING_FACILITY): Payer: Medicare Other | Admitting: Nurse Practitioner

## 2013-09-13 ENCOUNTER — Encounter: Payer: Self-pay | Admitting: Nurse Practitioner

## 2013-09-13 DIAGNOSIS — I4891 Unspecified atrial fibrillation: Secondary | ICD-10-CM

## 2013-09-13 DIAGNOSIS — E1159 Type 2 diabetes mellitus with other circulatory complications: Secondary | ICD-10-CM

## 2013-09-13 DIAGNOSIS — K5900 Constipation, unspecified: Secondary | ICD-10-CM

## 2013-09-13 DIAGNOSIS — I10 Essential (primary) hypertension: Secondary | ICD-10-CM

## 2013-09-13 DIAGNOSIS — G250 Essential tremor: Secondary | ICD-10-CM

## 2013-09-13 DIAGNOSIS — R609 Edema, unspecified: Secondary | ICD-10-CM

## 2013-09-13 DIAGNOSIS — F329 Major depressive disorder, single episode, unspecified: Secondary | ICD-10-CM

## 2013-09-13 DIAGNOSIS — M549 Dorsalgia, unspecified: Secondary | ICD-10-CM

## 2013-09-13 DIAGNOSIS — E039 Hypothyroidism, unspecified: Secondary | ICD-10-CM

## 2013-09-13 DIAGNOSIS — B372 Candidiasis of skin and nail: Secondary | ICD-10-CM

## 2013-09-13 NOTE — Assessment & Plan Note (Signed)
Buttocks-beefy red and excoriated which extends to thighs, abd, lower back with satellite pattern--improved on Diflucan 100mg  po daily x3, apply Clobetasol topical to affected area nightly and Silvadene cream qam. Wound culture showed MRSA 1/3--will observe the patient sine her rash has improved.

## 2013-09-13 NOTE — Assessment & Plan Note (Signed)
Mild head and hands tremor-not disabling. Takes Propranolol 10mg bid                                              

## 2013-09-13 NOTE — Progress Notes (Signed)
Patient ID: [name], female   DOB: 1926-10-18, 79 y.o.   MRN: [id number]  Code Status: DNR  Allergies  Allergen Reactions  . Penicillins Other (See Comments)    Per MAR  . Bee Venom Other (See Comments)    Per MAR  . Celebrex [Celecoxib] Other (See Comments)    Per MAR  . Lidocaine Hcl Other (See Comments)    Per MAR  . Phenobarbital Other (See Comments)    Per MAR  . Procaine Hcl Other (See Comments)    Per mar    Chief Complaint  Patient presents with  . Medical Managment of Chronic Issues    candidiasis of the buttocks/thighs  . Acute Visit    HPI: Patient is a 79 y.o. female seen in the SNF at [facility] today for evaluation extensive rash buttocks/thighs, blood sugar, blood pressure, memory, behaviros, and other chronic medical conditions.  Problem List Items Addressed This Visit   A-fib     Rate controlled. Propranolol was increased to 20mg  bid for better rate control in setting of HTN and essential tremor.                 Back pain     Chronic, pain is controlled with Fentanyl 140mcg/hr and Norco 10/325 q4hr                              Candidiasis of skin - Primary     Buttocks-beefy red and excoriated which extends to thighs, abd, lower back with satellite pattern--improved on Diflucan 100mg  po daily x3, apply Clobetasol topical to affected area nightly and Silvadene cream qam. Wound culture showed MRSA 1/3--will observe the patient sine her rash has improved.       Depression     Calling or visiting her husband more frequently at night and  difficulty with personal assistance or staff's redirection. This may be related to her decline in memory. Better with Cymbalta 30mg  bid and newly added Namenda. Observe the patient.                             Edema     Chronic venous insufficiency and dermatitis. Better with Furosemide 80mg  am and 40mg  pm with  Spironolactone 25mg  bid since  06/27/13--Bun/creat 47/1.12 07/01/13. Chronic LLE is bigger than the right                                 Essential tremor     Mild head and hands tremor-not disabling. Takes Propranolol 10mg  bid                                   HTN (hypertension)     Controlled after Losartan was decreased to 25mg  daily, monitor blood pressure daily                                    Type II or unspecified type diabetes mellitus with peripheral circulatory disorders, uncontrolled(250.72) (Chronic)     CBG range 95-262. No change in tx.       Unspecified constipation     Stable on colace 100 x2 qhs and Amitiza bid and MiraLax  daily                      Unspecified hypothyroidism (Chronic)     Corrected with Levothyroxine 112.55mcg, last TSH 2.069 08/26/13                                 Review of Systems: Review of Systems  Constitutional: Negative for fever, chills, weight loss, malaise/fatigue and diaphoresis.  HENT: Positive for hearing loss. Negative for congestion, ear discharge and sore throat.   Eyes: Negative for pain, discharge and redness.  Respiratory: Negative for cough, sputum production, shortness of breath and wheezing.   Cardiovascular: Positive for leg swelling and PND. Negative for chest pain, palpitations, orthopnea and claudication.  Gastrointestinal: Negative for heartburn, nausea, vomiting, abdominal pain, diarrhea, constipation and blood in stool.  Genitourinary: Positive for frequency. Negative for dysuria, urgency and flank pain.  Musculoskeletal: Positive for back pain, falls, joint pain and myalgias. Negative for neck pain.       C/o the right shoulder, upper arm, hip pain from fall last night 08/12/13. Able to ambulate with walker.   Skin: Positive for rash (chronic BLE venous dermatitis. Left medial forefoot diabetic foot ulcer).  Negative for itching.       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 Intergluteal cleft erythema and superficial skin missing-worse-beefy red extends to thighs/abd/lower back-improving.  Left forefoot diabetic ulcer--chronic about a quarter size. Pressure ulcers at R+L buttocks-in mirror image-2x2cm and superficial.   Neurological: Positive for tremors. Negative for dizziness, tingling, sensory change, speech change, focal weakness, seizures, loss of consciousness, weakness and headaches.  Endo/Heme/Allergies: Negative for environmental allergies and polydipsia. Does not bruise/bleed easily.  Psychiatric/Behavioral: Positive for depression and memory loss. Negative for hallucinations. The patient is nervous/anxious and has insomnia.        Confusion.      Past Medical History  Diagnosis Date  . Diabetes mellitus   . Hypertension   . Heart murmur   . Benign familial tremor   . Rosacea   . Diverticulitis   . Heart failure     diastolic heart failure  . TIA (transient ischemic attack) 2010  . Arthritis   . Breast cancer     breast -rt  . CAD (coronary artery disease)     single vessel CAD per cath in 2006; scattered nonobstructive disease in the left system, left to right collaterals to the RCA which was occluded by flush shots; she has been managed medically   Past Surgical History  Procedure Laterality Date  . Mastectomy partial / lumpectomy  11/02/2000    Right, with SLN  . Mastectomy partial / lumpectomy  05/15/2008    Left  . Breast lumpectomy  01/19/2010    right  . Mastectomy  03/31/2010    Right   Social History:   reports that she has never smoked. She does not have any smokeless tobacco history on file. She reports that she does not drink alcohol or use illicit drugs.  Medications: Patient's Medications  New Prescriptions   No medications on file  Previous Medications   ACETAMINOPHEN (MAPAP) 325 MG TABLET    Take 650 mg by mouth 2 (two) times daily.   ALOE-SODIUM CHLORIDE  (AYR SALINE NASAL GEL NA)    Place 1 application into the nose 2 (two) times daily. Both nostrils   ASPIRIN 81 MG CHEWABLE TABLET    Chew 81 mg  by mouth every morning.    CLONAZEPAM (KLONOPIN) 0.5 MG TABLET    Take 0.5 mg by mouth as needed for anxiety. Take one tab as needed   DOCUSATE SODIUM (COLACE) 100 MG CAPSULE    Take 100 mg by mouth 2 (two) times daily.    DORZOLAMIDE-TIMOLOL (COSOPT) 22.3-6.8 MG/ML OPHTHALMIC SOLUTION    Place 1 drop into the left eye every 12 (twelve) hours.   DULOXETINE (CYMBALTA) 30 MG CAPSULE    Take 30 mg by mouth 2 (two) times daily.   FENTANYL (DURAGESIC - DOSED MCG/HR) 100 MCG/HR    Apply 1 patch every 3 hours. Change site   FUROSEMIDE (LASIX) 40 MG TABLET    Take 40-80 mg by mouth 2 (two) times daily. 80 mg in the am and 40 mg at lunch (2 pm)   HYDROCODONE-ACETAMINOPHEN (NORCO) 10-325 MG PER TABLET    Take 0.5 tablets by mouth every 4 (four) hours as needed. For pain   HYDROXYZINE (ATARAX/VISTARIL) 25 MG TABLET    Take 25 mg by mouth at bedtime as needed. For itching   INSULIN ASPART (NOVOLOG) 100 UNIT/ML INJECTION    Inject 8 Units into the skin 2 (two) times daily. If CBG is greater than 150   INSULIN GLARGINE (LANTUS) 100 UNIT/ML INJECTION    Inject 24 Units into the skin at bedtime.    ISOSORBIDE MONONITRATE (IMDUR) 60 MG 24 HR TABLET    Take 60 mg by mouth every morning.    LEVOTHYROXINE (SYNTHROID, LEVOTHROID) 75 MCG TABLET    Take 75-112.5 mcg by mouth daily. Take 1.5 tablets (112.51mcg) on Saturday. Take 1 tablet (75 mcg) daily all other days of the week   LORATADINE (CLARITIN) 10 MG TABLET    Take 10 mg by mouth as needed for allergies.   LORAZEPAM (ATIVAN) 0.5 MG TABLET    Take 1 mg by mouth every 12 (twelve) hours as needed. Severe agitation   LOSARTAN (COZAAR) 50 MG TABLET    Take 50 mg by mouth daily.   LUBIPROSTONE (AMITIZA) 24 MCG CAPSULE    Take 24 mcg by mouth 2 (two) times daily with a meal.     MELOXICAM (MOBIC) 15 MG TABLET    Take 15 mg by  mouth daily.   MEMANTINE (NAMENDA) 10 MG TABLET    Take 10 mg by mouth 2 (two) times daily.   MULTIPLE VITAMINS-MINERALS (CERTA-VITE-LUTEIN PO)    Take 1 tablet by mouth daily.    NITROGLYCERIN (NITROSTAT) 0.4 MG SL TABLET    Place 0.4 mg under the tongue every 5 (five) minutes as needed. For chest pain   NUTRITIONAL SUPPLEMENTS (RESOURCE ARGINAID PO)    Take 1 packet by mouth 2 (two) times daily.   OMEPRAZOLE (PRILOSEC) 20 MG CAPSULE    Take 20 mg by mouth 2 (two) times daily.   OXYMETAZOLINE (AFRIN) 0.05 % NASAL SPRAY    Place 2 sprays into the nose 2 (two) times daily as needed. For congestion   POLYETHYLENE GLYCOL (MIRALAX / GLYCOLAX) PACKET    Take 17 g by mouth daily.   PRAMOXINE-MINERAL OIL-ZINC (TUCKS) 1-12.5 % RECTAL OINTMENT    Place 1 application rectally as needed for hemorrhoids.   PROBIOTIC PRODUCT (ALIGN) 4 MG CAPS    Take 1 capsule by mouth daily.   PROPRANOLOL (INDERAL) 20 MG TABLET    Take 20 mg by mouth 2 (two) times daily.   SPIRONOLACTONE (ALDACTONE) 25 MG TABLET    Take 25 mg by  mouth 2 (two) times daily.   VITAMIN D, ERGOCALCIFEROL, (DRISDOL) 50000 UNITS CAPS    Take 50,000 Units by mouth every 30 (thirty) days. On the first of the month  Modified Medications   No medications on file  Discontinued Medications   No medications on file     Physical Exam: Physical Exam  Constitutional: She appears well-developed.  HENT:  Head: Normocephalic and atraumatic.  Left Ear: External ear normal.  Eyes: Conjunctivae and EOM are normal. Pupils are equal, round, and reactive to light.  Neck: Neck supple. No JVD present. No thyromegaly present.  Cardiovascular: Normal rate and regular rhythm.   Murmur heard. Pulmonary/Chest: Effort normal.  Abdominal: Soft. There is no tenderness. There is no rebound.  Musculoskeletal: She exhibits edema.  The right shoulder/upper arm and hip pain  Lymphadenopathy:    She has no cervical adenopathy.  Neurological: She is alert. No cranial  nerve deficit. Coordination normal.  Skin: Skin is warm and dry. Rash noted.  Erythematous and chronic swelling in BLE. Left forefoot diabetic ulcer-larger and deeper in size-wound bed is clean with tunneling(priviously protruding center is flattened and is lower than the skin surface.  Intergluteal cleft skin breakdown with superficial skin missing and erythematous extended to the R+L 1/3 of inner buttocks--new pressure ulcers R+L buttocks near the intergluteal cleft with mirror imaging about a quarter seize stage II presently.  Beefy red buttocks extends to abd thighs, lower back with satellite pattern and excoriation-improving.   Psychiatric: Her mood appears anxious. Her affect is not blunt, not labile and not inappropriate. Her speech is rapid and/or pressured (occasionally ). Her speech is not delayed and not slurred. She is hyperactive. She is not agitated, not aggressive, not slowed, not withdrawn, not actively hallucinating and not combative. Thought content is not paranoid and not delusional. She does not exhibit a depressed mood. She exhibits abnormal recent memory.  MMSE 21/30 08/2013    Filed Vitals:   09/13/13 1510  BP: 120/80  Pulse: 94  Temp: 98 F (36.7 C)  TempSrc: Tympanic  Resp: 20      Labs reviewed: Basic Metabolic Panel:  Recent Labs  57/84/69 1852 01/24/13  05/27/13  07/20/13 1921 08/16/13 08/26/13 08/31/13 09/11/13  NA 139 139  < > 137  < > 136 137  --  136* 137  K 4.5 4.6  < > 4.5  < > 3.9 4.2  --  4.7 5.0  CL 98  --   --   --   --  97  --   --   --   --   CO2 29  --   --   --   --  30  --   --   --   --   GLUCOSE 255*  --   --   --   --  147*  --   --   --   --   BUN 47* 41*  < > 44*  < > 46* 41*  --  40* 40*  CREATININE 0.94 1.1  < > 1.3*  < > 1.00 1.1  --  1.0 1.1  CALCIUM 9.8  --   --   --   --  9.3  --   --   --   --   TSH 1.511 3.68  --  3.96  --   --   --  2.07  --   --   < > = values in this interval not displayed.  Liver Function Tests:  Recent  Labs  11/13/12 1852 07/20/13 1921 08/31/13  AST 21 19 20   ALT 19 12 13   ALKPHOS 74 62 82  BILITOT 0.4 0.3  --   PROT 7.5 7.0  --   ALBUMIN 3.6 2.8*  --    CBC:  Recent Labs  11/13/12 1852  07/20/13 1921 08/16/13 08/31/13  WBC 7.8  < > 9.3 9.0 8.5  NEUTROABS 5.8  --  6.5  --   --   HGB 13.6  < > 13.8 13.9 14.9  HCT 42.4  < > 39.7 40 44  MCV 98.1  --  97.1  --   --   PLT 196  < > 299 349 295  < > = values in this interval not displayed. 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 Bony irregularity and small chip fractures involve the turf of the distal phalanx left second toe. Complete dislocation of the interphalangeal joint of the left middle toe with displacement of the distal phalanx medially and associated with a few small bone densities involving the distal portion proximal phalanx most consistent with chip fractures. The findings may be secondary to osteomyelitis and continued follow up is recommended.   08/13/24 X-ray R hip, R humerus, R shoulder: no acute fracture or lytic destructive lesion  Assessment/Plan Candidiasis of skin Buttocks-beefy red and excoriated which extends to thighs, abd, lower back with satellite pattern--improved on Diflucan 100mg  po daily x3, apply Clobetasol topical to affected area nightly and Silvadene cream qam. Wound culture showed MRSA 1/3--will observe the patient sine her rash has improved.     A-fib Rate controlled. Propranolol was increased to 20mg  bid for better rate control in setting of HTN and essential tremor.               Back pain Chronic, pain is controlled with Fentanyl 17mcg/hr and Norco 10/325 q4hr                            Depression Calling or visiting her husband more frequently at night and  difficulty with personal assistance or staff's redirection. This may be related to her decline in memory. Better with Cymbalta 30mg  bid and  newly added Namenda. Observe the patient.                           Edema Chronic venous insufficiency and dermatitis. Better with Furosemide 80mg  am and 40mg  pm with  Spironolactone 25mg  bid since 06/27/13--Bun/creat 47/1.12 07/01/13. Chronic LLE is bigger than the right                               Essential tremor Mild head and hands tremor-not disabling. Takes Propranolol 10mg  bid                                 HTN (hypertension) Controlled after Losartan was decreased to 25mg  daily, monitor blood pressure daily                                  Type II or unspecified type diabetes mellitus with peripheral circulatory disorders, uncontrolled(250.72) CBG range 95-262. No change in tx.  Unspecified constipation Stable on colace 100 x2 qhs and Amitiza bid and MiraLax daily                    Unspecified hypothyroidism Corrected with Levothyroxine 112.28mcg, last TSH 2.069 08/26/13                              Family/ Staff Communication: observe the patient.   Goals of Care: SNF  Labs/tests ordered: none

## 2013-09-13 NOTE — Assessment & Plan Note (Signed)
Stable on colace 100 x2 qhs and Amitiza 24mcg bid and MiraLax daily             

## 2013-09-13 NOTE — Assessment & Plan Note (Signed)
Corrected with Levothyroxine 112.5mcg, last TSH 2.069 08/26/13    

## 2013-09-13 NOTE — Assessment & Plan Note (Signed)
Chronic, pain is controlled with Fentanyl 100mcg/hr and Norco 10/325 q4hr                                         

## 2013-09-13 NOTE — Assessment & Plan Note (Signed)
Chronic venous insufficiency and dermatitis. Better with Furosemide 80mg am and 40mg pm with  Spironolactone 25mg bid since 06/27/13--Bun/creat 47/1.12 07/01/13. Chronic LLE is bigger than the right.                        

## 2013-09-13 NOTE — Assessment & Plan Note (Signed)
Calling or visiting her husband more frequently at night and  difficulty with personal assistance or staff's redirection. This may be related to her decline in memory. Better with Cymbalta 30mg bid and newly added Namenda. Observe the patient.                        

## 2013-09-13 NOTE — Assessment & Plan Note (Signed)
Rate controlled. Propranolol was increased to 20mg bid for better rate control in setting of HTN and essential tremor.                                

## 2013-09-13 NOTE — Assessment & Plan Note (Signed)
CBG range 95-262. No change in tx.      

## 2013-09-13 NOTE — Assessment & Plan Note (Signed)
Controlled after Losartan was decreased to 25mg daily, monitor blood pressure daily.                                                   

## 2013-10-04 ENCOUNTER — Non-Acute Institutional Stay (SKILLED_NURSING_FACILITY): Payer: Medicare Other | Admitting: Nurse Practitioner

## 2013-10-04 ENCOUNTER — Encounter: Payer: Self-pay | Admitting: Nurse Practitioner

## 2013-10-04 DIAGNOSIS — F0391 Unspecified dementia with behavioral disturbance: Secondary | ICD-10-CM

## 2013-10-04 DIAGNOSIS — I4891 Unspecified atrial fibrillation: Secondary | ICD-10-CM

## 2013-10-04 DIAGNOSIS — M549 Dorsalgia, unspecified: Secondary | ICD-10-CM

## 2013-10-04 DIAGNOSIS — I10 Essential (primary) hypertension: Secondary | ICD-10-CM

## 2013-10-04 DIAGNOSIS — R609 Edema, unspecified: Secondary | ICD-10-CM

## 2013-10-04 DIAGNOSIS — F329 Major depressive disorder, single episode, unspecified: Secondary | ICD-10-CM

## 2013-10-04 DIAGNOSIS — K5900 Constipation, unspecified: Secondary | ICD-10-CM

## 2013-10-04 DIAGNOSIS — G250 Essential tremor: Secondary | ICD-10-CM

## 2013-10-04 DIAGNOSIS — I480 Paroxysmal atrial fibrillation: Secondary | ICD-10-CM

## 2013-10-04 DIAGNOSIS — E039 Hypothyroidism, unspecified: Secondary | ICD-10-CM

## 2013-10-04 DIAGNOSIS — E1159 Type 2 diabetes mellitus with other circulatory complications: Secondary | ICD-10-CM

## 2013-10-04 DIAGNOSIS — B372 Candidiasis of skin and nail: Secondary | ICD-10-CM

## 2013-10-04 NOTE — Assessment & Plan Note (Signed)
Chronic venous insufficiency and dermatitis. Better with Furosemide 80mg am and 40mg pm with  Spironolactone 25mg bid since 06/27/13--Bun/creat 47/1.12 07/01/13. Chronic LLE is bigger than the right.                        

## 2013-10-04 NOTE — Assessment & Plan Note (Signed)
CBG range 95-262. No change in tx.      

## 2013-10-04 NOTE — Assessment & Plan Note (Signed)
Stable on colace 100 x2 qhs and Amitiza 24mcg bid and MiraLax daily             

## 2013-10-04 NOTE — Assessment & Plan Note (Addendum)
Buttocks-beefy red and excoriated which extends to thighs, abd, lower back with satellite pattern--improved on Diflucan 100mg  po daily x3, relapsed with open areas in her buttocks--will apply Medi honey to open areas bid and Mycolog II to the rest of reddened areas bid.  Claritin 10mg  daily for antihistamine effect for skin irritation

## 2013-10-04 NOTE — Progress Notes (Signed)
Patient ID: [name], female   DOB: 1926-08-28, 79 y.o.   MRN: [id number]  Code Status: DNR  Allergies  Allergen Reactions  . Penicillins Other (See Comments)    Per MAR  . Bee Venom Other (See Comments)    Per MAR  . Celebrex [Celecoxib] Other (See Comments)    Per MAR  . Lidocaine Hcl Other (See Comments)    Per MAR  . Phenobarbital Other (See Comments)    Per MAR  . Procaine Hcl Other (See Comments)    Per mar    Chief Complaint  Patient presents with  . Medical Managment of Chronic Issues    excoriated buttocks.   . Acute Visit    HPI: Patient is a 79 y.o. female seen in the SNF at [facility] today for evaluation extensive reddened buttocks/thighs, , blood sugar, blood pressure, memory, behaviros, and other chronic medical conditions.  Problem List Items Addressed This Visit   A-fib     Rate controlled. Propranolol was increased to 20mg  bid for better rate control in setting of HTN and essential tremor.                   Back pain     Chronic, pain is controlled with Fentanyl 139mcg/hr and Norco 10/325 q4hr                                Candidiasis of skin     Buttocks-beefy red and excoriated which extends to thighs, abd, lower back with satellite pattern--improved on Diflucan 100mg  po daily x3, relapsed with open areas in her buttocks--will apply Medi honey to open areas bid and Mycolog II to the rest of reddened areas bid.  Claritin 10mg  daily for antihistamine effect for skin irritation        Dementia with behavioral disturbance     Her MMSE 21/3010/04/2013. Given her hx of cardiovascular issues--vascular dementia is more likely. Tolerated  Namenda and increased Cymbalta to 30mg  bid        Depression     Better with Cymbalta 30mg  bid and newly added Namenda. Observe the patient.                               Edema - Primary     Chronic venous insufficiency and dermatitis. Better  with Furosemide 80mg  am and 40mg  pm with  Spironolactone 25mg  bid since 06/27/13--Bun/creat 47/1.12 07/01/13. Chronic LLE is bigger than the right                                   Essential tremor     Mild head and hands tremor-not disabling. Takes Propranolol 10mg  bid                                     HTN (hypertension)     Controlled after Losartan was decreased to 25mg  daily, monitor blood pressure daily. Check BMP                                      PAF (paroxysmal atrial fibrillation)     Rate controlled. Propranolol was increased  to 20mg  bid for better rate control in setting of HTN and essential tremor.                   Type II or unspecified type diabetes mellitus with peripheral circulatory disorders, uncontrolled(250.72) (Chronic)     CBG range 95-262. No change in tx.         Unspecified constipation     Stable on colace 100 x2 qhs and Amitiza bid and MiraLax daily                        Unspecified hypothyroidism (Chronic)     Corrected with Levothyroxine 112.92mcg, last TSH 2.069 08/26/13                                   Review of Systems: Review of Systems  Constitutional: Negative for fever, chills, weight loss, malaise/fatigue and diaphoresis.  HENT: Positive for hearing loss. Negative for congestion, ear discharge and sore throat.   Eyes: Negative for pain, discharge and redness.  Respiratory: Negative for cough, sputum production, shortness of breath and wheezing.   Cardiovascular: Positive for leg swelling and PND. Negative for chest pain, palpitations, orthopnea and claudication.  Gastrointestinal: Negative for heartburn, nausea, vomiting, abdominal pain, diarrhea, constipation and blood in stool.  Genitourinary: Positive for frequency. Negative for dysuria, urgency and flank pain.   Musculoskeletal: Positive for back pain, falls, joint pain and myalgias. Negative for neck pain.       C/o the right shoulder, upper arm, hip pain from fall last night 08/12/13. Able to ambulate with walker.   Skin: Positive for rash (chronic BLE venous dermatitis. Left medial forefoot diabetic foot ulcer). Negative for itching.       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 Intergluteal cleft erythema and superficial skin missing-worse-beefy red extends to thighs/abd/lower back-improving.  Left forefoot diabetic ulcer--chronic about a quarter size. Pressure ulcers at R+L buttocks-in mirror image-2x2cm and superficial.   Neurological: Positive for tremors. Negative for dizziness, tingling, sensory change, speech change, focal weakness, seizures, loss of consciousness, weakness and headaches.  Endo/Heme/Allergies: Negative for environmental allergies and polydipsia. Does not bruise/bleed easily.  Psychiatric/Behavioral: Positive for depression and memory loss. Negative for hallucinations. The patient is nervous/anxious and has insomnia.        Confusion.      Past Medical History  Diagnosis Date  . Diabetes mellitus   . Hypertension   . Heart murmur   . Benign familial tremor   . Rosacea   . Diverticulitis   . Heart failure     diastolic heart failure  . TIA (transient ischemic attack) 2010  . Arthritis   . Breast cancer     breast -rt  . CAD (coronary artery disease)     single vessel CAD per cath in 2006; scattered nonobstructive disease in the left system, left to right collaterals to the RCA which was occluded by flush shots; she has been managed medically   Past Surgical History  Procedure Laterality Date  . Mastectomy partial / lumpectomy  11/02/2000    Right, with SLN  . Mastectomy partial / lumpectomy  05/15/2008    Left  . Breast lumpectomy  01/19/2010    right  . Mastectomy  03/31/2010    Right   Social History:   reports that she has never smoked. She does not have any  smokeless tobacco history  on file. She reports that she does not drink alcohol or use illicit drugs.  Medications: Patient's Medications  New Prescriptions   No medications on file  Previous Medications   ACETAMINOPHEN (MAPAP) 325 MG TABLET    Take 650 mg by mouth 2 (two) times daily.   ALOE-SODIUM CHLORIDE (AYR SALINE NASAL GEL NA)    Place 1 application into the nose 2 (two) times daily. Both nostrils   ASPIRIN 81 MG CHEWABLE TABLET    Chew 81 mg by mouth every morning.    CLONAZEPAM (KLONOPIN) 0.5 MG TABLET    Take 0.5 mg by mouth as needed for anxiety. Take one tab as needed   DOCUSATE SODIUM (COLACE) 100 MG CAPSULE    Take 100 mg by mouth 2 (two) times daily.    DORZOLAMIDE-TIMOLOL (COSOPT) 22.3-6.8 MG/ML OPHTHALMIC SOLUTION    Place 1 drop into the left eye every 12 (twelve) hours.   DULOXETINE (CYMBALTA) 30 MG CAPSULE    Take 30 mg by mouth 2 (two) times daily.   FENTANYL (DURAGESIC - DOSED MCG/HR) 100 MCG/HR    Apply 1 patch every 3 hours. Change site   FUROSEMIDE (LASIX) 40 MG TABLET    Take 40-80 mg by mouth 2 (two) times daily. 80 mg in the am and 40 mg at lunch (2 pm)   HYDROCODONE-ACETAMINOPHEN (NORCO) 10-325 MG PER TABLET    Take 0.5 tablets by mouth every 4 (four) hours as needed. For pain   HYDROXYZINE (ATARAX/VISTARIL) 25 MG TABLET    Take 25 mg by mouth at bedtime as needed. For itching   INSULIN ASPART (NOVOLOG) 100 UNIT/ML INJECTION    Inject 8 Units into the skin 2 (two) times daily. If CBG is greater than 150   INSULIN GLARGINE (LANTUS) 100 UNIT/ML INJECTION    Inject 24 Units into the skin at bedtime.    ISOSORBIDE MONONITRATE (IMDUR) 60 MG 24 HR TABLET    Take 60 mg by mouth every morning.    LEVOTHYROXINE (SYNTHROID, LEVOTHROID) 75 MCG TABLET    Take 75-112.5 mcg by mouth daily. Take 1.5 tablets (112.21mcg) on Saturday. Take 1 tablet (75 mcg) daily all other days of the week   LORATADINE (CLARITIN) 10 MG TABLET    Take 10 mg by mouth daily.    LORAZEPAM (ATIVAN) 0.5 MG  TABLET    Take 1 mg by mouth every 12 (twelve) hours as needed. Severe agitation   LOSARTAN (COZAAR) 50 MG TABLET    Take 50 mg by mouth daily.   LUBIPROSTONE (AMITIZA) 24 MCG CAPSULE    Take 24 mcg by mouth 2 (two) times daily with a meal.     MELOXICAM (MOBIC) 15 MG TABLET    Take 15 mg by mouth daily.   MEMANTINE (NAMENDA) 10 MG TABLET    Take 10 mg by mouth 2 (two) times daily.   MULTIPLE VITAMINS-MINERALS (CERTA-VITE-LUTEIN PO)    Take 1 tablet by mouth daily.    NITROGLYCERIN (NITROSTAT) 0.4 MG SL TABLET    Place 0.4 mg under the tongue every 5 (five) minutes as needed. For chest pain   NUTRITIONAL SUPPLEMENTS (RESOURCE ARGINAID PO)    Take 1 packet by mouth 2 (two) times daily.   OMEPRAZOLE (PRILOSEC) 20 MG CAPSULE    Take 20 mg by mouth 2 (two) times daily.   OXYMETAZOLINE (AFRIN) 0.05 % NASAL SPRAY    Place 2 sprays into the nose 2 (two) times daily as needed. For congestion   POLYETHYLENE  GLYCOL (MIRALAX / GLYCOLAX) PACKET    Take 17 g by mouth daily.   PRAMOXINE-MINERAL OIL-ZINC (TUCKS) 1-12.5 % RECTAL OINTMENT    Place 1 application rectally as needed for hemorrhoids.   PROBIOTIC PRODUCT (ALIGN) 4 MG CAPS    Take 1 capsule by mouth daily.   PROPRANOLOL (INDERAL) 20 MG TABLET    Take 20 mg by mouth 2 (two) times daily.   SPIRONOLACTONE (ALDACTONE) 25 MG TABLET    Take 25 mg by mouth 2 (two) times daily.   VITAMIN D, ERGOCALCIFEROL, (DRISDOL) 50000 UNITS CAPS    Take 50,000 Units by mouth every 30 (thirty) days. On the first of the month  Modified Medications   No medications on file  Discontinued Medications   No medications on file     Physical Exam: Physical Exam  Constitutional: She appears well-developed.  HENT:  Head: Normocephalic and atraumatic.  Left Ear: External ear normal.  Eyes: Conjunctivae and EOM are normal. Pupils are equal, round, and reactive to light.  Neck: Neck supple. No JVD present. No thyromegaly present.  Cardiovascular: Normal rate and regular  rhythm.   Murmur heard. Pulmonary/Chest: Effort normal.  Abdominal: Soft. There is no tenderness. There is no rebound.  Musculoskeletal: She exhibits edema.  The right shoulder/upper arm and hip pain  Lymphadenopathy:    She has no cervical adenopathy.  Neurological: She is alert. No cranial nerve deficit. Coordination normal.  Skin: Skin is warm and dry. Rash noted.  Erythematous and chronic swelling in BLE. Left forefoot diabetic ulcer-larger and deeper in size-wound bed is clean with tunneling(priviously protruding center is flattened and is lower than the skin surface.  Intergluteal cleft skin breakdown with superficial skin missing and erythematous extended to the R+L 1/3 of inner buttocks--new pressure ulcers R+L buttocks near the intergluteal cleft with mirror imaging about a quarter seize stage II presently.  Beefy red buttocks extends to abd thighs, lower back with satellite pattern and excoriation-improving.   Psychiatric: Her mood appears anxious. Her affect is not blunt, not labile and not inappropriate. Her speech is rapid and/or pressured (occasionally ). Her speech is not delayed and not slurred. She is hyperactive. She is not agitated, not aggressive, not slowed, not withdrawn, not actively hallucinating and not combative. Thought content is not paranoid and not delusional. She does not exhibit a depressed mood. She exhibits abnormal recent memory.  MMSE 21/30 08/2013    Filed Vitals:   10/04/13 1432  BP: 110/52  Pulse: 76  Temp: 98.6 F (37 C)  TempSrc: Tympanic  Resp: 16      Labs reviewed: Basic Metabolic Panel:  Recent Labs  16/10/96 1852 01/24/13  05/27/13  07/20/13 1921 08/16/13 08/26/13 08/31/13 09/11/13  NA 139 139  < > 137  < > 136 137  --  136* 137  K 4.5 4.6  < > 4.5  < > 3.9 4.2  --  4.7 5.0  CL 98  --   --   --   --  97  --   --   --   --   CO2 29  --   --   --   --  30  --   --   --   --   GLUCOSE 255*  --   --   --   --  147*  --   --   --   --    BUN 47* 41*  < > 44*  < > 46* 41*  --  40* 40*  CREATININE 0.94 1.1  < > 1.3*  < > 1.00 1.1  --  1.0 1.1  CALCIUM 9.8  --   --   --   --  9.3  --   --   --   --   TSH 1.511 3.68  --  3.96  --   --   --  2.07  --   --   < > = values in this interval not displayed. Liver Function Tests:  Recent Labs  11/13/12 1852 07/20/13 1921 08/31/13  AST 21 19 20   ALT 19 12 13   ALKPHOS 74 62 82  BILITOT 0.4 0.3  --   PROT 7.5 7.0  --   ALBUMIN 3.6 2.8*  --    CBC:  Recent Labs  11/13/12 1852  07/20/13 1921 08/16/13 08/31/13  WBC 7.8  < > 9.3 9.0 8.5  NEUTROABS 5.8  --  6.5  --   --   HGB 13.6  < > 13.8 13.9 14.9  HCT 42.4  < > 39.7 40 44  MCV 98.1  --  97.1  --   --   PLT 196  < > 299 349 295  < > = values in this interval not displayed. 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 Bony irregularity and small chip fractures involve the turf of the distal phalanx left second toe. Complete dislocation of the interphalangeal joint of the left middle toe with displacement of the distal phalanx medially and associated with a few small bone densities involving the distal portion proximal phalanx most consistent with chip fractures. The findings may be secondary to osteomyelitis and continued follow up is recommended.   08/13/24 X-ray R hip, R humerus, R shoulder: no acute fracture or lytic destructive lesion  Assessment/Plan A-fib Rate controlled. Propranolol was increased to 20mg  bid for better rate control in setting of HTN and essential tremor.                 Back pain Chronic, pain is controlled with Fentanyl 15mcg/hr and Norco 10/325 q4hr                              Candidiasis of skin Buttocks-beefy red and excoriated which extends to thighs, abd, lower back with satellite pattern--improved on Diflucan 100mg  po daily x3, relapsed with open areas in her buttocks--will apply Medi honey to open  areas bid and Mycolog II to the rest of reddened areas bid.  Claritin 10mg  daily for antihistamine effect for skin irritation      Dementia with behavioral disturbance Her MMSE 21/3010/04/2013. Given her hx of cardiovascular issues--vascular dementia is more likely. Tolerated  Namenda and increased Cymbalta to 30mg  bid      Depression Better with Cymbalta 30mg  bid and newly added Namenda. Observe the patient.                             Edema Chronic venous insufficiency and dermatitis. Better with Furosemide 80mg  am and 40mg  pm with  Spironolactone 25mg  bid since 06/27/13--Bun/creat 47/1.12 07/01/13. Chronic LLE is bigger than the right                                 Essential tremor Mild head and hands tremor-not disabling. Takes  Propranolol 10mg  bid                                   HTN (hypertension) Controlled after Losartan was decreased to 25mg  daily, monitor blood pressure daily. Check BMP                                    PAF (paroxysmal atrial fibrillation) Rate controlled. Propranolol was increased to 20mg  bid for better rate control in setting of HTN and essential tremor.                 Type II or unspecified type diabetes mellitus with peripheral circulatory disorders, uncontrolled(250.72) CBG range 95-262. No change in tx.       Unspecified constipation Stable on colace 100 x2 qhs and Amitiza bid and MiraLax daily                      Unspecified hypothyroidism Corrected with Levothyroxine 112.42mcg, last TSH 2.069 08/26/13                                Family/ Staff Communication: observe the patient.   Goals of Care: SNF  Labs/tests ordered: BMP

## 2013-10-04 NOTE — Assessment & Plan Note (Addendum)
Controlled after Losartan was decreased to 25mg  daily, monitor blood pressure daily. Check BMP

## 2013-10-04 NOTE — Assessment & Plan Note (Signed)
Chronic, pain is controlled with Fentanyl 100mcg/hr and Norco 10/325 q4hr                                         

## 2013-10-04 NOTE — Assessment & Plan Note (Signed)
Corrected with Levothyroxine 112.5mcg, last TSH 2.069 08/26/13    

## 2013-10-04 NOTE — Assessment & Plan Note (Signed)
Rate controlled. Propranolol was increased to 20mg bid for better rate control in setting of HTN and essential tremor.                                

## 2013-10-04 NOTE — Assessment & Plan Note (Signed)
Her MMSE 21/3010/04/2013. Given her hx of cardiovascular issues--vascular dementia is more likely. Tolerated  Namenda and increased Cymbalta to 30mg bid             

## 2013-10-04 NOTE — Assessment & Plan Note (Signed)
Mild head and hands tremor-not disabling. Takes Propranolol 10mg bid                                              

## 2013-10-04 NOTE — Assessment & Plan Note (Signed)
Better with Cymbalta 30mg bid and newly added Namenda. Observe the patient.    

## 2013-10-14 ENCOUNTER — Other Ambulatory Visit: Payer: Self-pay | Admitting: *Deleted

## 2013-10-14 MED ORDER — HYDROCODONE-ACETAMINOPHEN 10-325 MG PO TABS: ORAL_TABLET | ORAL | Status: DC

## 2013-10-15 ENCOUNTER — Encounter: Payer: Self-pay | Admitting: Nurse Practitioner

## 2013-10-15 ENCOUNTER — Non-Acute Institutional Stay (SKILLED_NURSING_FACILITY): Payer: Medicare Other | Admitting: Nurse Practitioner

## 2013-10-15 DIAGNOSIS — I259 Chronic ischemic heart disease, unspecified: Secondary | ICD-10-CM

## 2013-10-15 DIAGNOSIS — R609 Edema, unspecified: Secondary | ICD-10-CM

## 2013-10-15 DIAGNOSIS — G250 Essential tremor: Secondary | ICD-10-CM

## 2013-10-15 DIAGNOSIS — K5900 Constipation, unspecified: Secondary | ICD-10-CM

## 2013-10-15 DIAGNOSIS — I4891 Unspecified atrial fibrillation: Secondary | ICD-10-CM

## 2013-10-15 DIAGNOSIS — R296 Repeated falls: Secondary | ICD-10-CM

## 2013-10-15 DIAGNOSIS — E039 Hypothyroidism, unspecified: Secondary | ICD-10-CM

## 2013-10-15 DIAGNOSIS — F329 Major depressive disorder, single episode, unspecified: Secondary | ICD-10-CM

## 2013-10-15 DIAGNOSIS — Z9181 History of falling: Secondary | ICD-10-CM

## 2013-10-15 DIAGNOSIS — I10 Essential (primary) hypertension: Secondary | ICD-10-CM

## 2013-10-15 DIAGNOSIS — M549 Dorsalgia, unspecified: Secondary | ICD-10-CM

## 2013-10-15 DIAGNOSIS — F0391 Unspecified dementia with behavioral disturbance: Secondary | ICD-10-CM

## 2013-10-15 NOTE — Assessment & Plan Note (Signed)
Chronic, pain is controlled with Fentanyl 100mcg/hr and Norco 10/325 q4hr                                         

## 2013-10-15 NOTE — Assessment & Plan Note (Signed)
Controlled after Losartan was decreased to 25mg daily, monitor blood pressure daily.                                                   

## 2013-10-15 NOTE — Progress Notes (Signed)
Patient ID: [name], female   DOB: 03-23-1926, 79 y.o.   MRN: [id number]   Code Status: DNR  Allergies  Allergen Reactions  . Penicillins Other (See Comments)    Per MAR  . Bee Venom Other (See Comments)    Per MAR  . Celebrex [Celecoxib] Other (See Comments)    Per MAR  . Lidocaine Hcl Other (See Comments)    Per MAR  . Phenobarbital Other (See Comments)    Per MAR  . Procaine Hcl Other (See Comments)    Per mar    Chief Complaint  Patient presents with  . Medical Managment of Chronic Issues    falls, loose stools.   . Acute Visit    HPI: Patient is a 79 y.o. female seen in the SNF at [facility] today for evaluation of frequent loose stools, falls,  and other chronic medical conditions.  Problem List Items Addressed This Visit   Unspecified hypothyroidism (Chronic)     Corrected with Levothyroxine 112.35mcg, last TSH 2.069 08/26/13                                  Unspecified constipation - Primary     Frequent loose stools, takes colace 100 x2 qhs and Amitiza bid and MiraLax daily-will change MiraLax to prn. Update BMP                          Ischemic heart disease     Still had angina occasionally, f/u Cardiology, continue with Imdur 60mg  and Prn NTG(last dose 02/12/13)            HTN (hypertension)     Controlled after Losartan was decreased to 25mg  daily, monitor blood pressure daily.                                        Falls frequently     Frequently related to the patient's lack of safety awareness and increased physical frailty. Intensive supervision needed.     Essential tremor     Mild head and hands tremor-not disabling. Takes Propranolol 10mg  bid                                       Edema     Chronic venous insufficiency and dermatitis. Better with Furosemide 80mg  am and 40mg  pm with  Spironolactone 25mg  bid since  06/27/13--Bun/creat 21/0.81 10/07/13 Chronic LLE is bigger than the right                                     Depression     Better with Cymbalta 30mg  bid and newly added Namenda. Observe the patient.                                 Dementia with behavioral disturbance     Her MMSE 21/3010/04/2013. Given her hx of cardiovascular issues--vascular dementia is more likely. Tolerated  Namenda and increased Cymbalta to 30mg  bid          Back pain  Chronic, pain is controlled with Fentanyl 174mcg/hr and Norco 10/325 q4hr                                  A-fib     Rate controlled. Propranolol was increased to 20mg  bid for better rate control in setting of HTN and essential tremor.                        Review of Systems:  Review of Systems  Constitutional: Negative for fever, chills, weight loss, malaise/fatigue and diaphoresis.  HENT: Positive for hearing loss. Negative for congestion, ear discharge and sore throat.   Eyes: Negative for pain, discharge and redness.  Respiratory: Negative for cough, sputum production, shortness of breath and wheezing.   Cardiovascular: Positive for leg swelling and PND. Negative for chest pain, palpitations, orthopnea and claudication.  Gastrointestinal: Negative for heartburn, nausea, vomiting, abdominal pain, diarrhea, constipation and blood in stool.       Frequent loose stools.   Genitourinary: Positive for frequency. Negative for dysuria, urgency and flank pain.  Musculoskeletal: Positive for back pain, falls, joint pain and myalgias. Negative for neck pain.       C/o the right shoulder, upper arm, hip pain from fall last night 08/12/13. Able to ambulate with walker.   Skin: Positive for rash (chronic BLE venous dermatitis. Left medial forefoot diabetic foot ulcer). Negative for itching.       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 Intergluteal  cleft erythema and superficial skin missing-worse-beefy red extends to thighs/abd/lower back-improving.  Left forefoot diabetic ulcer--chronic about a quarter size. Pressure ulcers at R+L buttocks-in mirror image-2x2cm and superficial.   Neurological: Positive for tremors. Negative for dizziness, tingling, sensory change, speech change, focal weakness, seizures, loss of consciousness, weakness and headaches.  Endo/Heme/Allergies: Negative for environmental allergies and polydipsia. Does not bruise/bleed easily.  Psychiatric/Behavioral: Positive for depression and memory loss. Negative for hallucinations. The patient is nervous/anxious and has insomnia.        Confusion.      Past Medical History  Diagnosis Date  . Diabetes mellitus   . Hypertension   . Heart murmur   . Benign familial tremor   . Rosacea   . Diverticulitis   . Heart failure     diastolic heart failure  . TIA (transient ischemic attack) 2010  . Arthritis   . Breast cancer     breast -rt  . CAD (coronary artery disease)     single vessel CAD per cath in 2006; scattered nonobstructive disease in the left system, left to right collaterals to the RCA which was occluded by flush shots; she has been managed medically   Past Surgical History  Procedure Laterality Date  . Mastectomy partial / lumpectomy  11/02/2000    Right, with SLN  . Mastectomy partial / lumpectomy  05/15/2008    Left  . Breast lumpectomy  01/19/2010    right  . Mastectomy  03/31/2010    Right   Social History:   reports that she has never smoked. She does not have any smokeless tobacco history on file. She reports that she does not drink alcohol or use illicit drugs.  Medications: Patient's Medications  New Prescriptions   No medications on file  Previous Medications   ACETAMINOPHEN (MAPAP) 325 MG TABLET    Take 650 mg by mouth 2 (two) times daily.   ALOE-SODIUM CHLORIDE (AYR  SALINE NASAL GEL NA)    Place 1 application into the nose 2 (two) times  daily. Both nostrils   ASPIRIN 81 MG CHEWABLE TABLET    Chew 81 mg by mouth every morning.    DOCUSATE SODIUM (COLACE) 100 MG CAPSULE    Take 100 mg by mouth 2 (two) times daily.    DORZOLAMIDE-TIMOLOL (COSOPT) 22.3-6.8 MG/ML OPHTHALMIC SOLUTION    Place 1 drop into the left eye every 12 (twelve) hours.   DULOXETINE (CYMBALTA) 30 MG CAPSULE    Take 30 mg by mouth 2 (two) times daily.   FENTANYL (DURAGESIC - DOSED MCG/HR) 100 MCG/HR    Apply 1 patch every 3 hours. Change site   FUROSEMIDE (LASIX) 40 MG TABLET    Take 40-80 mg by mouth 2 (two) times daily. 80 mg in the am and 40 mg at lunch (2 pm)   HYDROCODONE-ACETAMINOPHEN (NORCO) 10-325 MG PER TABLET    Take 1/2 tablet by mouth every 4 hours for pain   HYDROXYZINE (ATARAX/VISTARIL) 25 MG TABLET    Take 25 mg by mouth at bedtime as needed. For itching   INSULIN ASPART (NOVOLOG) 100 UNIT/ML INJECTION    Inject 8 Units into the skin 2 (two) times daily. If CBG is greater than 150   INSULIN GLARGINE (LANTUS) 100 UNIT/ML INJECTION    Inject 24 Units into the skin at bedtime.    ISOSORBIDE MONONITRATE (IMDUR) 60 MG 24 HR TABLET    Take 60 mg by mouth every morning.    LEVOTHYROXINE (SYNTHROID, LEVOTHROID) 75 MCG TABLET    Take 75-112.5 mcg by mouth daily. Take 1.5 tablets (112.33mcg) on Saturday. Take 1 tablet (75 mcg) daily all other days of the week   LORATADINE (CLARITIN) 10 MG TABLET    Take 10 mg by mouth daily.    LORAZEPAM (ATIVAN) 0.5 MG TABLET    Take 1 mg by mouth every 12 (twelve) hours as needed. Severe agitation   LOSARTAN (COZAAR) 50 MG TABLET    Take 50 mg by mouth daily.   LUBIPROSTONE (AMITIZA) 24 MCG CAPSULE    Take 24 mcg by mouth 2 (two) times daily with a meal.     MEMANTINE (NAMENDA) 10 MG TABLET    Take 10 mg by mouth 2 (two) times daily.   MULTIPLE VITAMINS-MINERALS (CERTA-VITE-LUTEIN PO)    Take 1 tablet by mouth daily.    NITROGLYCERIN (NITROSTAT) 0.4 MG SL TABLET    Place 0.4 mg under the tongue every 5 (five) minutes as  needed. For chest pain   NUTRITIONAL SUPPLEMENTS (RESOURCE ARGINAID PO)    Take 1 packet by mouth 2 (two) times daily.   OMEPRAZOLE (PRILOSEC) 20 MG CAPSULE    Take 20 mg by mouth 2 (two) times daily.   OXYMETAZOLINE (AFRIN) 0.05 % NASAL SPRAY    Place 2 sprays into the nose 2 (two) times daily as needed. For congestion   POLYETHYLENE GLYCOL (MIRALAX / GLYCOLAX) PACKET    Take 17 g by mouth daily as needed.    PRAMOXINE-MINERAL OIL-ZINC (TUCKS) 1-12.5 % RECTAL OINTMENT    Place 1 application rectally as needed for hemorrhoids.   PROBIOTIC PRODUCT (ALIGN) 4 MG CAPS    Take 1 capsule by mouth daily.   PROPRANOLOL (INDERAL) 20 MG TABLET    Take 20 mg by mouth 2 (two) times daily.   SPIRONOLACTONE (ALDACTONE) 25 MG TABLET    Take 25 mg by mouth 2 (two) times daily.   VITAMIN D,  ERGOCALCIFEROL, (DRISDOL) 50000 UNITS CAPS    Take 50,000 Units by mouth every 30 (thirty) days. On the first of the month  Modified Medications   No medications on file  Discontinued Medications   CLONAZEPAM (KLONOPIN) 0.5 MG TABLET    Take 0.5 mg by mouth as needed for anxiety. Take one tab as needed   MELOXICAM (MOBIC) 15 MG TABLET    Take 15 mg by mouth daily.     Physical Exam: Physical Exam  Constitutional: She appears well-developed.  HENT:  Head: Normocephalic and atraumatic.  Left Ear: External ear normal.  Eyes: Conjunctivae and EOM are normal. Pupils are equal, round, and reactive to light.  Neck: Neck supple. No JVD present. No thyromegaly present.  Cardiovascular: Normal rate and regular rhythm.   Murmur heard. Pulmonary/Chest: Effort normal.  Abdominal: Soft. There is no tenderness. There is no rebound.  Musculoskeletal: She exhibits edema.  The right shoulder/upper arm and hip pain  Lymphadenopathy:    She has no cervical adenopathy.  Neurological: She is alert. No cranial nerve deficit. Coordination normal.  Skin: Skin is warm and dry. Rash noted.  Erythematous and chronic swelling in BLE. Left  forefoot diabetic ulcer-larger and deeper in size-wound bed is clean with tunneling(priviously protruding center is flattened and is lower than the skin surface.  Intergluteal cleft skin breakdown with superficial skin missing and erythematous extended to the R+L 1/3 of inner buttocks--new pressure ulcers R+L buttocks near the intergluteal cleft with mirror imaging about a quarter seize stage II presently.  Beefy red buttocks extends to abd thighs, lower back with satellite pattern and excoriation-improving.   Psychiatric: Her mood appears anxious. Her affect is not blunt, not labile and not inappropriate. Her speech is rapid and/or pressured (occasionally ). Her speech is not delayed and not slurred. She is hyperactive. She is not agitated, not aggressive, not slowed, not withdrawn, not actively hallucinating and not combative. Thought content is not paranoid and not delusional. She does not exhibit a depressed mood. She exhibits abnormal recent memory.  MMSE 21/30 08/2013    Filed Vitals:   10/15/13 1410  BP: 142/78  Pulse: 68  Temp: 98.1 F (36.7 C)  TempSrc: Tympanic  Resp: 18    Labs reviewed: Basic Metabolic Panel:  Recent Labs  96/04/54 1852 01/24/13  05/27/13  07/20/13 1921 08/16/13 08/26/13 08/31/13 09/11/13  NA 139 139  < > 137  < > 136 137  --  136* 137  K 4.5 4.6  < > 4.5  < > 3.9 4.2  --  4.7 5.0  CL 98  --   --   --   --  97  --   --   --   --   CO2 29  --   --   --   --  30  --   --   --   --   GLUCOSE 255*  --   --   --   --  147*  --   --   --   --   BUN 47* 41*  < > 44*  < > 46* 41*  --  40* 40*  CREATININE 0.94 1.1  < > 1.3*  < > 1.00 1.1  --  1.0 1.1  CALCIUM 9.8  --   --   --   --  9.3  --   --   --   --   TSH 1.511 3.68  --  3.96  --   --   --  2.07  --   --   < > = values in this interval not displayed. Liver Function Tests:  Recent Labs  11/13/12 1852 07/20/13 1921 08/31/13  AST 21 19 20   ALT 19 12 13   ALKPHOS 74 62 82  BILITOT 0.4 0.3  --   PROT 7.5  7.0  --   ALBUMIN 3.6 2.8*  --    CBC:  Recent Labs  11/13/12 1852  07/20/13 1921 08/16/13 08/31/13  WBC 7.8  < > 9.3 9.0 8.5  NEUTROABS 5.8  --  6.5  --   --   HGB 13.6  < > 13.8 13.9 14.9  HCT 42.4  < > 39.7 40 44  MCV 98.1  --  97.1  --   --   PLT 196  < > 299 349 295  < > = values in this interval not displayed.  Assessment/Plan Unspecified constipation Frequent loose stools, takes colace 100 x2 qhs and Amitiza bid and MiraLax daily-will change MiraLax to prn. Update BMP                        Edema Chronic venous insufficiency and dermatitis. Better with Furosemide 80mg  am and 40mg  pm with  Spironolactone 25mg  bid since 06/27/13--Bun/creat 21/0.81 10/07/13 Chronic LLE is bigger than the right                                   A-fib Rate controlled. Propranolol was increased to 20mg  bid for better rate control in setting of HTN and essential tremor.                   Back pain Chronic, pain is controlled with Fentanyl 153mcg/hr and Norco 10/325 q4hr                                Dementia with behavioral disturbance Her MMSE 21/3010/04/2013. Given her hx of cardiovascular issues--vascular dementia is more likely. Tolerated  Namenda and increased Cymbalta to 30mg  bid        Depression Better with Cymbalta 30mg  bid and newly added Namenda. Observe the patient.                               Essential tremor Mild head and hands tremor-not disabling. Takes Propranolol 10mg  bid                                     Falls frequently Frequently related to the patient's lack of safety awareness and increased physical frailty. Intensive supervision needed.   HTN (hypertension) Controlled after Losartan was decreased to 25mg  daily, monitor blood pressure daily.                                       Ischemic heart disease Still had angina occasionally, f/u Cardiology, continue with Imdur 60mg  and Prn NTG(last dose 02/12/13)          Unspecified hypothyroidism Corrected with Levothyroxine 112.87mcg, last TSH 2.069 08/26/13  Family/ Staff Communication: observe the patient.   Goals of Care: SNF  Labs/tests ordered: BMP

## 2013-10-15 NOTE — Assessment & Plan Note (Signed)
Frequent loose stools, takes colace 100 x2 qhs and Amitiza bid and MiraLax daily-will change MiraLax to prn. Update BMP

## 2013-10-15 NOTE — Assessment & Plan Note (Signed)
Corrected with Levothyroxine 112.5mcg, last TSH 2.069 08/26/13    

## 2013-10-15 NOTE — Assessment & Plan Note (Signed)
Frequently related to the patient's lack of safety awareness and increased physical frailty. Intensive supervision needed.

## 2013-10-15 NOTE — Assessment & Plan Note (Signed)
Mild head and hands tremor-not disabling. Takes Propranolol 10mg bid                                              

## 2013-10-15 NOTE — Assessment & Plan Note (Signed)
Better with Cymbalta 30mg bid and newly added Namenda. Observe the patient.    

## 2013-10-15 NOTE — Assessment & Plan Note (Signed)
Rate controlled. Propranolol was increased to 20mg bid for better rate control in setting of HTN and essential tremor.                                

## 2013-10-15 NOTE — Assessment & Plan Note (Signed)
Chronic venous insufficiency and dermatitis. Better with Furosemide 80mg am and 40mg pm with  Spironolactone 25mg bid since 06/27/13--Bun/creat 21/0.81 10/07/13 Chronic LLE is bigger than the right                                    

## 2013-10-15 NOTE — Assessment & Plan Note (Signed)
Her MMSE 21/3010/04/2013. Given her hx of cardiovascular issues--vascular dementia is more likely. Tolerated  Namenda and increased Cymbalta to 30mg bid             

## 2013-10-15 NOTE — Assessment & Plan Note (Signed)
Still had angina occasionally, f/u Cardiology, continue with Imdur 60mg and Prn NTG(last dose 02/12/13)                         

## 2013-10-17 LAB — BASIC METABOLIC PANEL
BUN: 23 mg/dL — AB (ref 4–21)
Glucose: 169 mg/dL
Sodium: 136 mmol/L — AB (ref 137–147)

## 2013-10-22 ENCOUNTER — Encounter: Payer: Self-pay | Admitting: Nurse Practitioner

## 2013-10-22 ENCOUNTER — Non-Acute Institutional Stay (SKILLED_NURSING_FACILITY): Payer: Medicare Other | Admitting: Nurse Practitioner

## 2013-10-22 DIAGNOSIS — I259 Chronic ischemic heart disease, unspecified: Secondary | ICD-10-CM

## 2013-10-22 DIAGNOSIS — E039 Hypothyroidism, unspecified: Secondary | ICD-10-CM

## 2013-10-22 DIAGNOSIS — I10 Essential (primary) hypertension: Secondary | ICD-10-CM

## 2013-10-22 DIAGNOSIS — I4891 Unspecified atrial fibrillation: Secondary | ICD-10-CM

## 2013-10-22 DIAGNOSIS — F329 Major depressive disorder, single episode, unspecified: Secondary | ICD-10-CM

## 2013-10-22 DIAGNOSIS — E1159 Type 2 diabetes mellitus with other circulatory complications: Secondary | ICD-10-CM

## 2013-10-22 DIAGNOSIS — R609 Edema, unspecified: Secondary | ICD-10-CM

## 2013-10-22 DIAGNOSIS — J069 Acute upper respiratory infection, unspecified: Secondary | ICD-10-CM | POA: Insufficient documentation

## 2013-10-22 DIAGNOSIS — R4182 Altered mental status, unspecified: Secondary | ICD-10-CM

## 2013-10-22 NOTE — Assessment & Plan Note (Signed)
Rate controlled. Propranolol was increased to 20mg bid for better rate control in setting of HTN and essential tremor.                                

## 2013-10-22 NOTE — Assessment & Plan Note (Signed)
Controlled after Losartan was decreased to 25mg daily, monitor blood pressure daily.                                                   

## 2013-10-22 NOTE — Assessment & Plan Note (Signed)
Corrected with Levothyroxine 112.5mcg, last TSH 2.069 08/26/13    

## 2013-10-22 NOTE — Assessment & Plan Note (Signed)
W/o focal neurological deficit. Acute URI contributes to the problem. Also she is taking Furosemide 120mg /Spironolactone 25mg  daily--risk for dehydration-update CMP. She takes Fentanyl and hydrocodone for chronic pain and Cymbalta for anxiety/depression all have mind alternation properties. Will continue to monitor the patient.

## 2013-10-22 NOTE — Assessment & Plan Note (Signed)
Chronic venous insufficiency and dermatitis. Better with Furosemide 80mg  am and 40mg  pm with  Spironolactone 25mg  bid since 06/27/13--Bun/creat 21/0.81 10/07/13 Chronic LLE is bigger than the right

## 2013-10-22 NOTE — Progress Notes (Signed)
Patient ID: [name], female   DOB: 12/11/1925, 79 y.o.   MRN: [id number]   Code Status: DNR  Allergies  Allergen Reactions  . Penicillins Other (See Comments)    Per MAR  . Bee Venom Other (See Comments)    Per MAR  . Celebrex [Celecoxib] Other (See Comments)    Per MAR  . Lidocaine Hcl Other (See Comments)    Per MAR  . Phenobarbital Other (See Comments)    Per MAR  . Procaine Hcl Other (See Comments)    Per mar    Chief Complaint  Patient presents with  . Medical Managment of Chronic Issues    confusion, lethargic, congested, non productive cough  . Acute Visit    HPI: Patient is a 79 y.o. female seen in the SNF at [facility] today for evaluation of lethargy, confusion, congestion, non productive cough,  and other chronic medical conditions.  Problem List Items Addressed This Visit   Type II or unspecified type diabetes mellitus with peripheral circulatory disorders, uncontrolled(250.72) (Chronic)     CBG range 95-262. No change in tx.         Unspecified hypothyroidism (Chronic)     Corrected with Levothyroxine 112.20mcg, last TSH 2.069 08/26/13                                    Ischemic heart disease     Still had angina occasionally, f/u Cardiology, continue with Imdur 60mg  and Prn NTG(last dose 02/12/13)              Depression     Better with Cymbalta 30mg  bid and newly added Namenda. Observe the patient.                                   HTN (hypertension)     Controlled after Losartan was decreased to 25mg  daily, monitor blood pressure daily.                                          Edema     Chronic venous insufficiency and dermatitis. Better with Furosemide 80mg  am and 40mg  pm with  Spironolactone 25mg  bid since 06/27/13--Bun/creat 21/0.81 10/07/13 Chronic LLE is bigger than the  right                                       A-fib     Rate controlled. Propranolol was increased to 20mg  bid for better rate control in setting of HTN and essential tremor.                       Altered mental status     W/o focal neurological deficit. Acute URI contributes to the problem. Also she is taking Furosemide 120mg /Spironolactone 25mg  daily--risk for dehydration-update CMP. She takes Fentanyl and hydrocodone for chronic pain and Cymbalta for anxiety/depression all have mind alternation properties. Will continue to monitor the patient.     Acute upper respiratory infections of unspecified site - Primary     Nasal congestion: Atrovent nasal spray II each nostril tid for 2 weeks. Non -productive cough--Mucinex 600mg  bid for 5  days. Levaquin 500mg  daily for 7 days along with FloraStor. CXR to evaluate further.        Review of Systems:  Review of Systems  Constitutional: Positive for malaise/fatigue. Negative for fever, chills, weight loss and diaphoresis.  HENT: Positive for congestion and hearing loss. Negative for ear discharge and sore throat.   Eyes: Negative for pain, discharge and redness.  Respiratory: Positive for cough. Negative for sputum production, shortness of breath and wheezing.   Cardiovascular: Positive for leg swelling and PND. Negative for chest pain, palpitations, orthopnea and claudication.  Gastrointestinal: Negative for heartburn, nausea, vomiting, abdominal pain, diarrhea, constipation and blood in stool.       Frequent loose stools.   Genitourinary: Positive for frequency. Negative for dysuria, urgency and flank pain.  Musculoskeletal: Positive for back pain, falls, joint pain and myalgias. Negative for neck pain.       C/o the right shoulder, upper arm, hip pain from fall last night 08/12/13. Able to ambulate with walker.   Skin: Positive for rash (chronic BLE venous dermatitis. Left medial forefoot  diabetic foot ulcer). Negative for itching.       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 Intergluteal cleft erythema and superficial skin missing-worse-beefy red extends to thighs/abd/lower back-improving.  Left forefoot diabetic ulcer--chronic about a quarter size. Pressure ulcers at R+L buttocks-in mirror image-2x2cm and superficial.   Neurological: Positive for tremors. Negative for dizziness, tingling, sensory change, speech change, focal weakness, seizures, loss of consciousness, weakness and headaches.  Endo/Heme/Allergies: Negative for environmental allergies and polydipsia. Does not bruise/bleed easily.  Psychiatric/Behavioral: Positive for depression and memory loss. Negative for hallucinations. The patient is nervous/anxious and has insomnia.        Confusion.      Past Medical History  Diagnosis Date  . Diabetes mellitus   . Hypertension   . Heart murmur   . Benign familial tremor   . Rosacea   . Diverticulitis   . Heart failure     diastolic heart failure  . TIA (transient ischemic attack) 2010  . Arthritis   . Breast cancer     breast -rt  . CAD (coronary artery disease)     single vessel CAD per cath in 2006; scattered nonobstructive disease in the left system, left to right collaterals to the RCA which was occluded by flush shots; she has been managed medically   Past Surgical History  Procedure Laterality Date  . Mastectomy partial / lumpectomy  11/02/2000    Right, with SLN  . Mastectomy partial / lumpectomy  05/15/2008    Left  . Breast lumpectomy  01/19/2010    right  . Mastectomy  03/31/2010    Right   Social History:   reports that she has never smoked. She does not have any smokeless tobacco history on file. She reports that she does not drink alcohol or use illicit drugs.  Medications: Patient's Medications  New Prescriptions   No medications on file  Previous Medications   ACETAMINOPHEN (MAPAP) 325 MG TABLET    Take 650 mg by mouth 2 (two) times daily.    ALOE-SODIUM CHLORIDE (AYR SALINE NASAL GEL NA)    Place 1 application into the nose 2 (two) times daily. Both nostrils   ASPIRIN 81 MG CHEWABLE TABLET    Chew 81 mg by mouth every morning.    DOCUSATE SODIUM (COLACE) 100 MG CAPSULE    Take 100 mg by mouth 2 (two) times daily.    DORZOLAMIDE-TIMOLOL (COSOPT) 22.3-6.8 MG/ML  OPHTHALMIC SOLUTION    Place 1 drop into the left eye every 12 (twelve) hours.   DULOXETINE (CYMBALTA) 30 MG CAPSULE    Take 30 mg by mouth 2 (two) times daily.   FENTANYL (DURAGESIC - DOSED MCG/HR) 100 MCG/HR    Apply 1 patch every 3 hours. Change site   FUROSEMIDE (LASIX) 40 MG TABLET    Take 40-80 mg by mouth 2 (two) times daily. 80 mg in the am and 40 mg at lunch (2 pm)   HYDROCODONE-ACETAMINOPHEN (NORCO) 10-325 MG PER TABLET    Take 1/2 tablet by mouth every 4 hours for pain   HYDROXYZINE (ATARAX/VISTARIL) 25 MG TABLET    Take 25 mg by mouth at bedtime as needed. For itching   INSULIN ASPART (NOVOLOG) 100 UNIT/ML INJECTION    Inject 8 Units into the skin 2 (two) times daily. If CBG is greater than 150   INSULIN GLARGINE (LANTUS) 100 UNIT/ML INJECTION    Inject 24 Units into the skin at bedtime.    ISOSORBIDE MONONITRATE (IMDUR) 60 MG 24 HR TABLET    Take 60 mg by mouth every morning.    LEVOTHYROXINE (SYNTHROID, LEVOTHROID) 75 MCG TABLET    Take 75-112.5 mcg by mouth daily. Take 1.5 tablets (112.66mcg) on Saturday. Take 1 tablet (75 mcg) daily all other days of the week   LORATADINE (CLARITIN) 10 MG TABLET    Take 10 mg by mouth daily.    LORAZEPAM (ATIVAN) 0.5 MG TABLET    Take 1 mg by mouth every 12 (twelve) hours as needed. Severe agitation   LOSARTAN (COZAAR) 50 MG TABLET    Take 50 mg by mouth daily.   LUBIPROSTONE (AMITIZA) 24 MCG CAPSULE    Take 24 mcg by mouth 2 (two) times daily with a meal.     MEMANTINE (NAMENDA) 10 MG TABLET    Take 10 mg by mouth 2 (two) times daily.   MULTIPLE VITAMINS-MINERALS (CERTA-VITE-LUTEIN PO)    Take 1 tablet by mouth daily.     NITROGLYCERIN (NITROSTAT) 0.4 MG SL TABLET    Place 0.4 mg under the tongue every 5 (five) minutes as needed. For chest pain   NUTRITIONAL SUPPLEMENTS (RESOURCE ARGINAID PO)    Take 1 packet by mouth 2 (two) times daily.   OMEPRAZOLE (PRILOSEC) 20 MG CAPSULE    Take 20 mg by mouth 2 (two) times daily.   OXYMETAZOLINE (AFRIN) 0.05 % NASAL SPRAY    Place 2 sprays into the nose 2 (two) times daily as needed. For congestion   POLYETHYLENE GLYCOL (MIRALAX / GLYCOLAX) PACKET    Take 17 g by mouth daily as needed.    PRAMOXINE-MINERAL OIL-ZINC (TUCKS) 1-12.5 % RECTAL OINTMENT    Place 1 application rectally as needed for hemorrhoids.   PROBIOTIC PRODUCT (ALIGN) 4 MG CAPS    Take 1 capsule by mouth daily.   PROPRANOLOL (INDERAL) 20 MG TABLET    Take 20 mg by mouth 2 (two) times daily.   SPIRONOLACTONE (ALDACTONE) 25 MG TABLET    Take 25 mg by mouth 2 (two) times daily.   VITAMIN D, ERGOCALCIFEROL, (DRISDOL) 50000 UNITS CAPS    Take 50,000 Units by mouth every 30 (thirty) days. On the first of the month  Modified Medications   No medications on file  Discontinued Medications   No medications on file     Physical Exam: Physical Exam  Constitutional: She appears well-developed.  HENT:  Head: Normocephalic and atraumatic.  Left Ear: External  ear normal.  Nasal congestion.   Eyes: Conjunctivae and EOM are normal. Pupils are equal, round, and reactive to light.  Neck: Neck supple. No JVD present. No thyromegaly present.  Cardiovascular: Normal rate and regular rhythm.   Murmur heard. Pulmonary/Chest: Effort normal. She has rales.  Bibasilar rales. Central congestion.   Abdominal: Soft. There is no tenderness. There is no rebound.  Musculoskeletal: She exhibits edema.  The right shoulder/upper arm and hip pain  Lymphadenopathy:    She has no cervical adenopathy.  Neurological: She is alert. No cranial nerve deficit. Coordination normal.  Skin: Skin is warm and dry. Rash noted.  Erythematous and  chronic swelling in BLE. Left forefoot diabetic ulcer-larger and deeper in size-wound bed is clean with tunneling(priviously protruding center is flattened and is lower than the skin surface.  Intergluteal cleft skin breakdown with superficial skin missing and erythematous extended to the R+L 1/3 of inner buttocks--new pressure ulcers R+L buttocks near the intergluteal cleft with mirror imaging about a quarter seize stage II presently.  Beefy red buttocks extends to abd thighs, lower back with satellite pattern and excoriation-improving.   Psychiatric: Her mood appears anxious. Her affect is not blunt, not labile and not inappropriate. Her speech is rapid and/or pressured (occasionally ). Her speech is not delayed and not slurred. She is hyperactive. She is not agitated, not aggressive, not slowed, not withdrawn, not actively hallucinating and not combative. Thought content is not paranoid and not delusional. She does not exhibit a depressed mood. She exhibits abnormal recent memory.  MMSE 21/30 08/2013    Filed Vitals:   10/22/13 1316  BP: 100/70  Pulse: 68  Temp: 97.7 F (36.5 C)  TempSrc: Tympanic  Resp: 18      Labs reviewed: Basic Metabolic Panel:  Recent Labs  45/40/98 1852 01/24/13  05/27/13  07/20/13 1921  08/26/13 08/31/13 09/11/13 10/17/13  NA 139 139  < > 137  < > 136  < >  --  136* 137 136*  K 4.5 4.6  < > 4.5  < > 3.9  < >  --  4.7 5.0 4.2  CL 98  --   --   --   --  97  --   --   --   --   --   CO2 29  --   --   --   --  30  --   --   --   --   --   GLUCOSE 255*  --   --   --   --  147*  --   --   --   --   --   BUN 47* 41*  < > 44*  < > 46*  < >  --  40* 40* 23*  CREATININE 0.94 1.1  < > 1.3*  < > 1.00  < >  --  1.0 1.1 0.9  CALCIUM 9.8  --   --   --   --  9.3  --   --   --   --   --   TSH 1.511 3.68  --  3.96  --   --   --  2.07  --   --   --   < > = values in this interval not displayed. Liver Function Tests:  Recent Labs  11/13/12 1852 07/20/13 1921 08/31/13   AST 21 19 20   ALT 19 12 13   ALKPHOS 74 62 82  BILITOT 0.4 0.3  --  PROT 7.5 7.0  --   ALBUMIN 3.6 2.8*  --    CBC:  Recent Labs  11/13/12 1852  07/20/13 1921 08/16/13 08/31/13  WBC 7.8  < > 9.3 9.0 8.5  NEUTROABS 5.8  --  6.5  --   --   HGB 13.6  < > 13.8 13.9 14.9  HCT 42.4  < > 39.7 40 44  MCV 98.1  --  97.1  --   --   PLT 196  < > 299 349 295  < > = values in this interval not displayed.  Past Procedures:  10/22/13 CXR pending.     Assessment/Plan Acute upper respiratory infections of unspecified site Nasal congestion: Atrovent nasal spray II each nostril tid for 2 weeks. Non -productive cough--Mucinex 600mg  bid for 5 days. Levaquin 500mg  daily for 7 days along with FloraStor. CXR to evaluate further.   Altered mental status W/o focal neurological deficit. Acute URI contributes to the problem. Also she is taking Furosemide 120mg /Spironolactone 25mg  daily--risk for dehydration-update CMP. She takes Fentanyl and hydrocodone for chronic pain and Cymbalta for anxiety/depression all have mind alternation properties. Will continue to monitor the patient.   A-fib Rate controlled. Propranolol was increased to 20mg  bid for better rate control in setting of HTN and essential tremor.                     Edema Chronic venous insufficiency and dermatitis. Better with Furosemide 80mg  am and 40mg  pm with  Spironolactone 25mg  bid since 06/27/13--Bun/creat 21/0.81 10/07/13 Chronic LLE is bigger than the right                                     HTN (hypertension) Controlled after Losartan was decreased to 25mg  daily, monitor blood pressure daily.                                        Type II or unspecified type diabetes mellitus with peripheral circulatory disorders, uncontrolled(250.72) CBG range 95-262. No change in tx.       Unspecified hypothyroidism Corrected with Levothyroxine  112.90mcg, last TSH 2.069 08/26/13                                  Ischemic heart disease Still had angina occasionally, f/u Cardiology, continue with Imdur 60mg  and Prn NTG(last dose 02/12/13)            Depression Better with Cymbalta 30mg  bid and newly added Namenda. Observe the patient.                                   Family/ Staff Communication: observe the patient.   Goals of Care: SNF  Labs/tests ordered:  CXR, CMP, CBC

## 2013-10-22 NOTE — Assessment & Plan Note (Signed)
CBG range 95-262. No change in tx.

## 2013-10-22 NOTE — Assessment & Plan Note (Signed)
Better with Cymbalta 30mg bid and newly added Namenda. Observe the patient.    

## 2013-10-22 NOTE — Assessment & Plan Note (Signed)
Nasal congestion: Atrovent nasal spray II each nostril tid for 2 weeks. Non -productive cough--Mucinex 600mg  bid for 5 days. Levaquin 500mg  daily for 7 days along with FloraStor. CXR to evaluate further.

## 2013-10-22 NOTE — Assessment & Plan Note (Signed)
Still had angina occasionally, f/u Cardiology, continue with Imdur 60mg and Prn NTG(last dose 02/12/13)                         

## 2013-10-23 LAB — CBC AND DIFFERENTIAL
Platelets: 245 10*3/uL (ref 150–399)
WBC: 6.9 10^3/mL

## 2013-10-23 LAB — BASIC METABOLIC PANEL
BUN: 46 mg/dL — AB (ref 4–21)
Creatinine: 1.2 mg/dL — AB (ref 0.5–1.1)
Glucose: 138 mg/dL
Potassium: 5.3 mmol/L (ref 3.4–5.3)

## 2013-10-23 LAB — HEPATIC FUNCTION PANEL: ALT: 16 U/L (ref 7–35)

## 2013-10-24 ENCOUNTER — Non-Acute Institutional Stay (SKILLED_NURSING_FACILITY): Payer: Medicare Other | Admitting: Nurse Practitioner

## 2013-10-24 DIAGNOSIS — I10 Essential (primary) hypertension: Secondary | ICD-10-CM

## 2013-10-24 DIAGNOSIS — G250 Essential tremor: Secondary | ICD-10-CM

## 2013-10-24 DIAGNOSIS — J189 Pneumonia, unspecified organism: Secondary | ICD-10-CM

## 2013-10-24 DIAGNOSIS — K5900 Constipation, unspecified: Secondary | ICD-10-CM

## 2013-10-24 DIAGNOSIS — M549 Dorsalgia, unspecified: Secondary | ICD-10-CM

## 2013-10-24 DIAGNOSIS — R609 Edema, unspecified: Secondary | ICD-10-CM

## 2013-10-24 DIAGNOSIS — J069 Acute upper respiratory infection, unspecified: Secondary | ICD-10-CM

## 2013-10-24 DIAGNOSIS — F03918 Unspecified dementia, unspecified severity, with other behavioral disturbance: Secondary | ICD-10-CM

## 2013-10-24 DIAGNOSIS — F0391 Unspecified dementia with behavioral disturbance: Secondary | ICD-10-CM

## 2013-10-24 DIAGNOSIS — I4891 Unspecified atrial fibrillation: Secondary | ICD-10-CM

## 2013-10-24 DIAGNOSIS — I259 Chronic ischemic heart disease, unspecified: Secondary | ICD-10-CM

## 2013-10-24 DIAGNOSIS — E039 Hypothyroidism, unspecified: Secondary | ICD-10-CM

## 2013-10-24 DIAGNOSIS — N289 Disorder of kidney and ureter, unspecified: Secondary | ICD-10-CM

## 2013-10-24 DIAGNOSIS — E1159 Type 2 diabetes mellitus with other circulatory complications: Secondary | ICD-10-CM

## 2013-10-25 ENCOUNTER — Encounter: Payer: Self-pay | Admitting: Nurse Practitioner

## 2013-10-25 DIAGNOSIS — N289 Disorder of kidney and ureter, unspecified: Secondary | ICD-10-CM | POA: Insufficient documentation

## 2013-10-25 DIAGNOSIS — J189 Pneumonia, unspecified organism: Secondary | ICD-10-CM | POA: Insufficient documentation

## 2013-10-25 NOTE — Assessment & Plan Note (Signed)
Corrected with Levothyroxine 112.5mcg, last TSH 2.069 08/26/13    

## 2013-10-25 NOTE — Assessment & Plan Note (Signed)
Still had angina occasionally, f/u Cardiology, continue with Imdur 60mg and Prn NTG(last dose 02/12/13)                         

## 2013-10-25 NOTE — Assessment & Plan Note (Signed)
Hemoconcentration-Hgb 15.2, AKD--Na 133, K 5.3, Bun 46, creatinine 1.25-will hold Furosemide and Spironolactone for 3 days-f/u BMP next Monday.

## 2013-10-25 NOTE — Progress Notes (Signed)
Patient ID: [name], female   DOB: 1926/04/03, 79 y.o.   MRN: [id number]   Code Status: DNR  Allergies  Allergen Reactions  . Penicillins Other (See Comments)    Per MAR  . Bee Venom Other (See Comments)    Per MAR  . Celebrex [Celecoxib] Other (See Comments)    Per MAR  . Lidocaine Hcl Other (See Comments)    Per MAR  . Phenobarbital Other (See Comments)    Per MAR  . Procaine Hcl Other (See Comments)    Per mar    Chief Complaint  Patient presents with  . Medical Managment of Chronic Issues    pnemonia  . Acute Visit    HPI: Patient is a 79 y.o. female seen in the SNF at [facility] today for evaluation of PNA, acute renal disease, and other chronic medical conditions.  Problem List Items Addressed This Visit   Type II or unspecified type diabetes mellitus with peripheral circulatory disorders, uncontrolled(250.72) (Chronic)     Closely monitor CBGs since the patient's oral intake is unpredictable since onset of PNA          Unspecified hypothyroidism (Chronic)     Corrected with Levothyroxine 112.26mcg, last TSH 2.069 08/26/13                                      Ischemic heart disease     Still had angina occasionally, f/u Cardiology, continue with Imdur 60mg  and Prn NTG(last dose 02/12/13)                Essential tremor     Mild head and hands tremor-not disabling. Takes Propranolol 10mg  bid                                         Back pain     Chronic, pain is controlled with Fentanyl 136mcg/hr and Norco 10/325 q4hr                                    HTN (hypertension)     Controlled after Losartan was decreased to 25mg  daily, monitor blood pressure daily.                                            Unspecified constipation     Frequent loose stools, takes colace 100 x2 qhs and Amitiza bid and  MiraLax prn.                             Edema     Chronic venous insufficiency and dermatitis. Better with Furosemide 80mg  am and 40mg  pm with  Spironolactone 25mg  bid since 06/27/13--Bun/creat 21/0.81 10/07/13 and 46/1.25 10/23/13.  Chronic LLE is bigger than the right                                         A-fib     Rate controlled. Propranolol was increased to 20mg  bid for better  rate control in setting of HTN and essential tremor.                         Dementia with behavioral disturbance     Her MMSE 21/3010/04/2013. Given her hx of cardiovascular issues--vascular dementia is more likely. Tolerated  Namenda and increased Cymbalta to 30mg  bid            Acute upper respiratory infections of unspecified site     Continue Atrovent nasal spray II each nostril tid and Mucinex 600mg  bid for total 2 weeks . Levaquin 500mg  daily for 7 days along with FloraStor. Adding DuoNeb q6hr for 1 week. Observe the patient.     HCAP (healthcare-associated pneumonia) - Primary     Congestive cough, listlessness, CXR 10/22/13 showed patchy interstitial changes left lower lung most consistent with pneumonia.     Acute renal disease     Hemoconcentration-Hgb 15.2, AKD--Na 133, K 5.3, Bun 46, creatinine 1.25-will hold Furosemide and Spironolactone for 3 days-f/u BMP next Monday.         Review of Systems:  Review of Systems  Constitutional: Positive for malaise/fatigue. Negative for fever, chills, weight loss and diaphoresis.  HENT: Positive for congestion and hearing loss. Negative for ear discharge and sore throat.   Eyes: Negative for pain, discharge and redness.  Respiratory: Positive for cough. Negative for sputum production, shortness of breath and wheezing.   Cardiovascular: Positive for leg swelling and PND. Negative for chest pain, palpitations, orthopnea and claudication.  Gastrointestinal: Negative for  heartburn, nausea, vomiting, abdominal pain, diarrhea, constipation and blood in stool.       Frequent loose stools.   Genitourinary: Positive for frequency. Negative for dysuria, urgency and flank pain.  Musculoskeletal: Positive for back pain, falls, joint pain and myalgias. Negative for neck pain.       C/o the right shoulder, upper arm, hip pain from fall last night 08/12/13. Able to ambulate with walker.   Skin: Positive for rash (chronic BLE venous dermatitis. Left medial forefoot diabetic foot ulcer). Negative for itching.       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 Intergluteal cleft erythema and superficial skin missing-worse-beefy red extends to thighs/abd/lower back-improving.  Left forefoot diabetic ulcer--chronic about a quarter size. Pressure ulcers at R+L buttocks-in mirror image-2x2cm and superficial.   Neurological: Positive for tremors. Negative for dizziness, tingling, sensory change, speech change, focal weakness, seizures, loss of consciousness, weakness and headaches.  Endo/Heme/Allergies: Negative for environmental allergies and polydipsia. Does not bruise/bleed easily.  Psychiatric/Behavioral: Positive for depression and memory loss. Negative for hallucinations. The patient is nervous/anxious and has insomnia.        Confusion.      Past Medical History  Diagnosis Date  . Diabetes mellitus   . Hypertension   . Heart murmur   . Benign familial tremor   . Rosacea   . Diverticulitis   . Heart failure     diastolic heart failure  . TIA (transient ischemic attack) 2010  . Arthritis   . Breast cancer     breast -rt  . CAD (coronary artery disease)     single vessel CAD per cath in 2006; scattered nonobstructive disease in the left system, left to right collaterals to the RCA which was occluded by flush shots; she has been managed medically   Past Surgical History  Procedure Laterality Date  . Mastectomy partial / lumpectomy  11/02/2000    Right, with SLN  . Mastectomy  partial / lumpectomy  05/15/2008    Left  . Breast lumpectomy  01/19/2010    right  . Mastectomy  03/31/2010    Right   Social History:   reports that she has never smoked. She does not have any smokeless tobacco history on file. She reports that she does not drink alcohol or use illicit drugs.  Medications: Patient's Medications  New Prescriptions   No medications on file  Previous Medications   ACETAMINOPHEN (MAPAP) 325 MG TABLET    Take 650 mg by mouth 2 (two) times daily.   ALOE-SODIUM CHLORIDE (AYR SALINE NASAL GEL NA)    Place 1 application into the nose 2 (two) times daily. Both nostrils   ASPIRIN 81 MG CHEWABLE TABLET    Chew 81 mg by mouth every morning.    DOCUSATE SODIUM (COLACE) 100 MG CAPSULE    Take 100 mg by mouth 2 (two) times daily.    DORZOLAMIDE-TIMOLOL (COSOPT) 22.3-6.8 MG/ML OPHTHALMIC SOLUTION    Place 1 drop into the left eye every 12 (twelve) hours.   DULOXETINE (CYMBALTA) 30 MG CAPSULE    Take 30 mg by mouth 2 (two) times daily.   FENTANYL (DURAGESIC - DOSED MCG/HR) 100 MCG/HR    Apply 1 patch every 3 hours. Change site   FUROSEMIDE (LASIX) 40 MG TABLET    Take 40-80 mg by mouth 2 (two) times daily. 80 mg in the am and 40 mg at lunch (2 pm)   HYDROCODONE-ACETAMINOPHEN (NORCO) 10-325 MG PER TABLET    Take 1/2 tablet by mouth every 4 hours for pain   HYDROXYZINE (ATARAX/VISTARIL) 25 MG TABLET    Take 25 mg by mouth at bedtime as needed. For itching   INSULIN ASPART (NOVOLOG) 100 UNIT/ML INJECTION    Inject 8 Units into the skin 2 (two) times daily. If CBG is greater than 150   INSULIN GLARGINE (LANTUS) 100 UNIT/ML INJECTION    Inject 24 Units into the skin at bedtime.    ISOSORBIDE MONONITRATE (IMDUR) 60 MG 24 HR TABLET    Take 60 mg by mouth every morning.    LEVOTHYROXINE (SYNTHROID, LEVOTHROID) 75 MCG TABLET    Take 75-112.5 mcg by mouth daily. Take 1.5 tablets (112.56mcg) on Saturday. Take 1 tablet (75 mcg) daily all other days of the week   LORATADINE (CLARITIN) 10  MG TABLET    Take 10 mg by mouth daily.    LORAZEPAM (ATIVAN) 0.5 MG TABLET    Take 1 mg by mouth every 12 (twelve) hours as needed. Severe agitation   LOSARTAN (COZAAR) 50 MG TABLET    Take 50 mg by mouth daily.   LUBIPROSTONE (AMITIZA) 24 MCG CAPSULE    Take 24 mcg by mouth 2 (two) times daily with a meal.     MEMANTINE (NAMENDA) 10 MG TABLET    Take 10 mg by mouth 2 (two) times daily.   MULTIPLE VITAMINS-MINERALS (CERTA-VITE-LUTEIN PO)    Take 1 tablet by mouth daily.    NITROGLYCERIN (NITROSTAT) 0.4 MG SL TABLET    Place 0.4 mg under the tongue every 5 (five) minutes as needed. For chest pain   NUTRITIONAL SUPPLEMENTS (RESOURCE ARGINAID PO)    Take 1 packet by mouth 2 (two) times daily.   OMEPRAZOLE (PRILOSEC) 20 MG CAPSULE    Take 20 mg by mouth 2 (two) times daily.   OXYMETAZOLINE (AFRIN) 0.05 % NASAL SPRAY    Place 2 sprays into the nose 2 (two) times daily as needed.  For congestion   POLYETHYLENE GLYCOL (MIRALAX / GLYCOLAX) PACKET    Take 17 g by mouth daily as needed.    PRAMOXINE-MINERAL OIL-ZINC (TUCKS) 1-12.5 % RECTAL OINTMENT    Place 1 application rectally as needed for hemorrhoids.   PROBIOTIC PRODUCT (ALIGN) 4 MG CAPS    Take 1 capsule by mouth daily.   PROPRANOLOL (INDERAL) 20 MG TABLET    Take 20 mg by mouth 2 (two) times daily.   SPIRONOLACTONE (ALDACTONE) 25 MG TABLET    Take 25 mg by mouth 2 (two) times daily.   VITAMIN D, ERGOCALCIFEROL, (DRISDOL) 50000 UNITS CAPS    Take 50,000 Units by mouth every 30 (thirty) days. On the first of the month  Modified Medications   No medications on file  Discontinued Medications   No medications on file     Physical Exam: Physical Exam  Constitutional: She appears well-developed.  HENT:  Head: Normocephalic and atraumatic.  Left Ear: External ear normal.  Nasal congestion.   Eyes: Conjunctivae and EOM are normal. Pupils are equal, round, and reactive to light.  Neck: Neck supple. No JVD present. No thyromegaly present.    Cardiovascular: Normal rate and regular rhythm.   Murmur heard. Pulmonary/Chest: Effort normal. She has rales.  Bibasilar rales. Central congestion.   Abdominal: Soft. There is no tenderness. There is no rebound.  Musculoskeletal: She exhibits edema.  The right shoulder/upper arm and hip pain  Lymphadenopathy:    She has no cervical adenopathy.  Neurological: She is alert. No cranial nerve deficit. Coordination normal.  Skin: Skin is warm and dry. Rash noted.  Erythematous and chronic swelling in BLE. Left forefoot diabetic ulcer-larger and deeper in size-wound bed is clean with tunneling(priviously protruding center is flattened and is lower than the skin surface.  Intergluteal cleft skin breakdown with superficial skin missing and erythematous extended to the R+L 1/3 of inner buttocks--new pressure ulcers R+L buttocks near the intergluteal cleft with mirror imaging about a quarter seize stage II presently.  Beefy red buttocks extends to abd thighs, lower back with satellite pattern and excoriation-improving.   Psychiatric: Her mood appears anxious. Her affect is not blunt, not labile and not inappropriate. Her speech is rapid and/or pressured (occasionally ). Her speech is not delayed and not slurred. She is hyperactive. She is not agitated, not aggressive, not slowed, not withdrawn, not actively hallucinating and not combative. Thought content is not paranoid and not delusional. She does not exhibit a depressed mood. She exhibits abnormal recent memory.  MMSE 21/30 08/2013    Filed Vitals:   10/24/13 1209  BP: 143/84  Pulse: 82  Temp: 97.2 F (36.2 C)  TempSrc: Tympanic  Resp: 20      Labs reviewed: Basic Metabolic Panel:  Recent Labs  16/10/96 1852 01/24/13  05/27/13  07/20/13 1921  08/26/13  09/11/13 10/17/13 10/23/13  NA 139 139  < > 137  < > 136  < >  --   < > 137 136* 133*  K 4.5 4.6  < > 4.5  < > 3.9  < >  --   < > 5.0 4.2 5.3  CL 98  --   --   --   --  97  --   --    --   --   --   --   CO2 29  --   --   --   --  30  --   --   --   --   --   --  GLUCOSE 255*  --   --   --   --  147*  --   --   --   --   --   --   BUN 47* 41*  < > 44*  < > 46*  < >  --   < > 40* 23* 46*  CREATININE 0.94 1.1  < > 1.3*  < > 1.00  < >  --   < > 1.1 0.9 1.2*  CALCIUM 9.8  --   --   --   --  9.3  --   --   --   --   --   --   TSH 1.511 3.68  --  3.96  --   --   --  2.07  --   --   --   --   < > = values in this interval not displayed. Liver Function Tests:  Recent Labs  11/13/12 1852 07/20/13 1921 08/31/13 10/23/13  AST 21 19 20  34  ALT 19 12 13 16   ALKPHOS 74 62 82 62  BILITOT 0.4 0.3  --   --   PROT 7.5 7.0  --   --   ALBUMIN 3.6 2.8*  --   --    CBC:  Recent Labs  11/13/12 1852  07/20/13 1921 08/16/13 08/31/13 10/23/13  WBC 7.8  < > 9.3 9.0 8.5 6.9  NEUTROABS 5.8  --  6.5  --   --   --   HGB 13.6  < > 13.8 13.9 14.9 15.2  HCT 42.4  < > 39.7 40 44 44  MCV 98.1  --  97.1  --   --   --   PLT 196  < > 299 349 295 245  < > = values in this interval not displayed.  Past Procedures:  10/22/13 CXR patchy interstitial changes left lower lung most consistent with pneumonia    Assessment/Plan HCAP (healthcare-associated pneumonia) Congestive cough, listlessness, CXR 10/22/13 showed patchy interstitial changes left lower lung most consistent with pneumonia.   Acute upper respiratory infections of unspecified site Continue Atrovent nasal spray II each nostril tid and Mucinex 600mg  bid for total 2 weeks . Levaquin 500mg  daily for 7 days along with FloraStor. Adding DuoNeb q6hr for 1 week. Observe the patient.   Acute renal disease Hemoconcentration-Hgb 15.2, AKD--Na 133, K 5.3, Bun 46, creatinine 1.25-will hold Furosemide and Spironolactone for 3 days-f/u BMP next Monday.    Dementia with behavioral disturbance Her MMSE 21/3010/04/2013. Given her hx of cardiovascular issues--vascular dementia is more likely. Tolerated  Namenda and increased Cymbalta to 30mg   bid          A-fib Rate controlled. Propranolol was increased to 20mg  bid for better rate control in setting of HTN and essential tremor.                       Edema Chronic venous insufficiency and dermatitis. Better with Furosemide 80mg  am and 40mg  pm with  Spironolactone 25mg  bid since 06/27/13--Bun/creat 21/0.81 10/07/13 and 46/1.25 10/23/13.  Chronic LLE is bigger than the right                                       Unspecified constipation Frequent loose stools, takes colace 100 x2 qhs and Amitiza bid and MiraLax prn.  HTN (hypertension) Controlled after Losartan was decreased to 25mg  daily, monitor blood pressure daily.                                          Back pain Chronic, pain is controlled with Fentanyl 175mcg/hr and Norco 10/325 q4hr                                  Essential tremor Mild head and hands tremor-not disabling. Takes Propranolol 10mg  bid                                       Ischemic heart disease Still had angina occasionally, f/u Cardiology, continue with Imdur 60mg  and Prn NTG(last dose 02/12/13)              Unspecified hypothyroidism Corrected with Levothyroxine 112.65mcg, last TSH 2.069 08/26/13                                    Type II or unspecified type diabetes mellitus with peripheral circulatory disorders, uncontrolled(250.72) Closely monitor CBGs since the patient's oral intake is unpredictable since onset of PNA          Family/ Staff Communication: observe the patient.   Goals of Care: SNF  Labs/tests ordered:  BMP next Monday.

## 2013-10-25 NOTE — Assessment & Plan Note (Signed)
Closely monitor CBGs since the patient's oral intake is unpredictable since onset of PNA         

## 2013-10-25 NOTE — Assessment & Plan Note (Signed)
Mild head and hands tremor-not disabling. Takes Propranolol 10mg bid                                              

## 2013-10-25 NOTE — Assessment & Plan Note (Signed)
Chronic venous insufficiency and dermatitis. Better with Furosemide 80mg am and 40mg pm with  Spironolactone 25mg bid since 06/27/13--Bun/creat 21/0.81 10/07/13 and 46/1.25 10/23/13.  Chronic LLE is bigger than the right                                        

## 2013-10-25 NOTE — Assessment & Plan Note (Signed)
Controlled after Losartan was decreased to 25mg daily, monitor blood pressure daily.                                                   

## 2013-10-25 NOTE — Assessment & Plan Note (Signed)
Congestive cough, listlessness, CXR 10/22/13 showed patchy interstitial changes left lower lung most consistent with pneumonia.

## 2013-10-25 NOTE — Assessment & Plan Note (Signed)
Her MMSE 21/3010/04/2013. Given her hx of cardiovascular issues--vascular dementia is more likely. Tolerated  Namenda and increased Cymbalta to 30mg bid             

## 2013-10-25 NOTE — Assessment & Plan Note (Signed)
Continue Atrovent nasal spray II each nostril tid and Mucinex 600mg  bid for total 2 weeks . Levaquin 500mg  daily for 7 days along with FloraStor. Adding DuoNeb q6hr for 1 week. Observe the patient.

## 2013-10-25 NOTE — Assessment & Plan Note (Signed)
Frequent loose stools, takes colace 100 x2 qhs and Amitiza 24mcg bid and MiraLax prn.                                    

## 2013-10-25 NOTE — Assessment & Plan Note (Signed)
Rate controlled. Propranolol was increased to 20mg bid for better rate control in setting of HTN and essential tremor.                                

## 2013-10-25 NOTE — Assessment & Plan Note (Signed)
Chronic, pain is controlled with Fentanyl 100mcg/hr and Norco 10/325 q4hr                                         

## 2013-10-28 LAB — BASIC METABOLIC PANEL
BUN: 35 mg/dL — AB (ref 4–21)
Creatinine: 1.2 mg/dL — AB (ref 0.5–1.1)
Glucose: 181 mg/dL
Potassium: 4.8 mmol/L (ref 3.4–5.3)
Sodium: 134 mmol/L — AB (ref 137–147)

## 2013-10-29 ENCOUNTER — Non-Acute Institutional Stay (SKILLED_NURSING_FACILITY): Payer: Medicare Other | Admitting: Nurse Practitioner

## 2013-10-29 ENCOUNTER — Encounter: Payer: Self-pay | Admitting: Nurse Practitioner

## 2013-10-29 DIAGNOSIS — I259 Chronic ischemic heart disease, unspecified: Secondary | ICD-10-CM

## 2013-10-29 DIAGNOSIS — F0391 Unspecified dementia with behavioral disturbance: Secondary | ICD-10-CM

## 2013-10-29 DIAGNOSIS — R609 Edema, unspecified: Secondary | ICD-10-CM

## 2013-10-29 DIAGNOSIS — I4891 Unspecified atrial fibrillation: Secondary | ICD-10-CM

## 2013-10-29 DIAGNOSIS — E039 Hypothyroidism, unspecified: Secondary | ICD-10-CM

## 2013-10-29 DIAGNOSIS — M549 Dorsalgia, unspecified: Secondary | ICD-10-CM

## 2013-10-29 DIAGNOSIS — E1159 Type 2 diabetes mellitus with other circulatory complications: Secondary | ICD-10-CM

## 2013-10-29 DIAGNOSIS — R4182 Altered mental status, unspecified: Secondary | ICD-10-CM

## 2013-10-29 DIAGNOSIS — I10 Essential (primary) hypertension: Secondary | ICD-10-CM

## 2013-10-29 DIAGNOSIS — N289 Disorder of kidney and ureter, unspecified: Secondary | ICD-10-CM

## 2013-10-29 DIAGNOSIS — F329 Major depressive disorder, single episode, unspecified: Secondary | ICD-10-CM

## 2013-10-29 DIAGNOSIS — K5900 Constipation, unspecified: Secondary | ICD-10-CM

## 2013-10-29 DIAGNOSIS — G250 Essential tremor: Secondary | ICD-10-CM

## 2013-10-29 DIAGNOSIS — J189 Pneumonia, unspecified organism: Secondary | ICD-10-CM

## 2013-10-29 NOTE — Assessment & Plan Note (Signed)
Mild head and hands tremor-not disabling. Takes Propranolol 10mg bid                                              

## 2013-10-29 NOTE — Assessment & Plan Note (Signed)
Better with Cymbalta 30mg bid and newly added Namenda. Observe the patient.    

## 2013-10-29 NOTE — Assessment & Plan Note (Signed)
Rate controlled. Propranolol was increased to 20mg bid for better rate control in setting of HTN and essential tremor.                                

## 2013-10-29 NOTE — Progress Notes (Signed)
Patient ID: [name], female   DOB: 1926/05/21, 79 y.o.   MRN: [id number]   Code Status: DNR  Allergies  Allergen Reactions  . Penicillins Other (See Comments)    Per MAR  . Bee Venom Other (See Comments)    Per MAR  . Celebrex [Celecoxib] Other (See Comments)    Per MAR  . Lidocaine Hcl Other (See Comments)    Per MAR  . Phenobarbital Other (See Comments)    Per MAR  . Procaine Hcl Other (See Comments)    Per mar    Chief Complaint  Patient presents with  . Medical Managment of Chronic Issues    AKD  . Acute Visit    HPI: Patient is a 79 y.o. female seen in the SNF at [facility] today for evaluation of PNA, acute renal disease, and other chronic medical conditions.  Problem List Items Addressed This Visit   Type II or unspecified type diabetes mellitus with peripheral circulatory disorders, uncontrolled(250.72) (Chronic)     Closely monitor CBGs since the patient's oral intake is unpredictable since onset of PNA            Unspecified hypothyroidism (Chronic)     Corrected with Levothyroxine 112.69mcg, last TSH 2.069 08/26/13                                        Ischemic heart disease     Still had angina occasionally, f/u Cardiology, continue with Imdur 60mg  and Prn NTG(last dose 02/12/13)                  Depression     Better with Cymbalta 30mg  bid and newly added Namenda. Observe the patient.                                     Essential tremor     Mild head and hands tremor-not disabling. Takes Propranolol 10mg  bid                                           Back pain     Chronic, pain is controlled with Fentanyl 122mcg/hr and Norco 10/325 q4hr                                      HTN (hypertension)     Controlled after Losartan was decreased to 25mg  daily, monitor blood pressure daily.                                               Unspecified constipation     Frequent loose stools, takes colace 100 x2 qhs and Amitiza bid and MiraLax prn.                               Edema     Chronic venous insufficiency and dermatitis. Better with Furosemide 80mg  am and 40mg  pm with  Spironolactone 25mg  bid since 06/27/13--Bun/creat 21/0.81  10/07/13 and 46/1.25 10/23/13.  Chronic LLE is bigger than the right                                           A-fib     Rate controlled. Propranolol was increased to 20mg  bid for better rate control in setting of HTN and essential tremor.                           Altered mental status     Has returned to her baseline mentation.     Dementia with behavioral disturbance     Her MMSE 21/3010/04/2013. Given her hx of cardiovascular issues--vascular dementia is more likely. Tolerated  Namenda and increased Cymbalta to 30mg  bid              HCAP (healthcare-associated pneumonia)     Improved Congestive cough, listlessness, CXR 10/22/13 showed patchy interstitial changes left lower lung most consistent with pneumonia. Will extend Levaquin 500mg  daily for total 10 days started 10/22/13.      Acute renal disease - Primary     Hemoconcentration-Hgb 15.2, AKD--Na 133, K 5.3, Bun 46, creatinine 1.25-held  Furosemide and Spironolactone for 3 days 10/24/13-persisted mild elevated creatinine 1.17 10/28/13. 10/29/13 continue to hold Furosemide/Spironolactone for 3 days--f/u BMP. Her oral intake is improving.          Review of Systems:  Review of Systems  Constitutional: Positive for malaise/fatigue. Negative for fever, chills, weight loss and diaphoresis.  HENT: Positive for congestion and hearing loss. Negative for ear discharge and sore throat.   Eyes: Negative for pain, discharge and redness.  Respiratory: Positive  for cough. Negative for sputum production, shortness of breath and wheezing.   Cardiovascular: Positive for leg swelling and PND. Negative for chest pain, palpitations, orthopnea and claudication.  Gastrointestinal: Negative for heartburn, nausea, vomiting, abdominal pain, diarrhea, constipation and blood in stool.       Frequent loose stools.   Genitourinary: Positive for frequency. Negative for dysuria, urgency and flank pain.  Musculoskeletal: Positive for back pain, falls, joint pain and myalgias. Negative for neck pain.       C/o the right shoulder, upper arm, hip pain from fall last night 08/12/13. Able to ambulate with walker.   Skin: Positive for rash (chronic BLE venous dermatitis. Left medial forefoot diabetic foot ulcer). Negative for itching.       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 Intergluteal cleft erythema and superficial skin missing-worse-beefy red extends to thighs/abd/lower back-improving.  Left forefoot diabetic ulcer--chronic about a quarter size. Pressure ulcers at R+L buttocks-in mirror image-2x2cm and superficial.   Neurological: Positive for tremors. Negative for dizziness, tingling, sensory change, speech change, focal weakness, seizures, loss of consciousness, weakness and headaches.  Endo/Heme/Allergies: Negative for environmental allergies and polydipsia. Does not bruise/bleed easily.  Psychiatric/Behavioral: Positive for depression and memory loss. Negative for hallucinations. The patient is nervous/anxious and has insomnia.        Confusion.      Past Medical History  Diagnosis Date  . Diabetes mellitus   . Hypertension   . Heart murmur   . Benign familial tremor   . Rosacea   . Diverticulitis   . Heart failure     diastolic heart failure  . TIA (transient ischemic attack) 2010  . Arthritis   . Breast cancer  breast -rt  . CAD (coronary artery disease)     single vessel CAD per cath in 2006; scattered nonobstructive disease in the left system, left to  right collaterals to the RCA which was occluded by flush shots; she has been managed medically   Past Surgical History  Procedure Laterality Date  . Mastectomy partial / lumpectomy  11/02/2000    Right, with SLN  . Mastectomy partial / lumpectomy  05/15/2008    Left  . Breast lumpectomy  01/19/2010    right  . Mastectomy  03/31/2010    Right   Social History:   reports that she has never smoked. She does not have any smokeless tobacco history on file. She reports that she does not drink alcohol or use illicit drugs.  Medications: Patient's Medications  New Prescriptions   No medications on file  Previous Medications   ACETAMINOPHEN (MAPAP) 325 MG TABLET    Take 650 mg by mouth 2 (two) times daily.   ALOE-SODIUM CHLORIDE (AYR SALINE NASAL GEL NA)    Place 1 application into the nose 2 (two) times daily. Both nostrils   ASPIRIN 81 MG CHEWABLE TABLET    Chew 81 mg by mouth every morning.    DOCUSATE SODIUM (COLACE) 100 MG CAPSULE    Take 100 mg by mouth 2 (two) times daily.    DORZOLAMIDE-TIMOLOL (COSOPT) 22.3-6.8 MG/ML OPHTHALMIC SOLUTION    Place 1 drop into the left eye every 12 (twelve) hours.   DULOXETINE (CYMBALTA) 30 MG CAPSULE    Take 30 mg by mouth 2 (two) times daily.   FENTANYL (DURAGESIC - DOSED MCG/HR) 100 MCG/HR    Apply 1 patch every 3 hours. Change site   FUROSEMIDE (LASIX) 40 MG TABLET    Take 40-80 mg by mouth 2 (two) times daily. 80 mg in the am and 40 mg at lunch (2 pm)   HYDROCODONE-ACETAMINOPHEN (NORCO) 10-325 MG PER TABLET    Take 1/2 tablet by mouth every 4 hours for pain   HYDROXYZINE (ATARAX/VISTARIL) 25 MG TABLET    Take 25 mg by mouth at bedtime as needed. For itching   INSULIN ASPART (NOVOLOG) 100 UNIT/ML INJECTION    Inject 8 Units into the skin 2 (two) times daily. If CBG is greater than 150   INSULIN GLARGINE (LANTUS) 100 UNIT/ML INJECTION    Inject 24 Units into the skin at bedtime.    ISOSORBIDE MONONITRATE (IMDUR) 60 MG 24 HR TABLET    Take 60 mg by mouth  every morning.    LEVOTHYROXINE (SYNTHROID, LEVOTHROID) 75 MCG TABLET    Take 75-112.5 mcg by mouth daily. Take 1.5 tablets (112.3mcg) on Saturday. Take 1 tablet (75 mcg) daily all other days of the week   LORATADINE (CLARITIN) 10 MG TABLET    Take 10 mg by mouth daily.    LORAZEPAM (ATIVAN) 0.5 MG TABLET    Take 1 mg by mouth every 12 (twelve) hours as needed. Severe agitation   LOSARTAN (COZAAR) 50 MG TABLET    Take 50 mg by mouth daily.   LUBIPROSTONE (AMITIZA) 24 MCG CAPSULE    Take 24 mcg by mouth 2 (two) times daily with a meal.     MEMANTINE (NAMENDA) 10 MG TABLET    Take 10 mg by mouth 2 (two) times daily.   MULTIPLE VITAMINS-MINERALS (CERTA-VITE-LUTEIN PO)    Take 1 tablet by mouth daily.    NITROGLYCERIN (NITROSTAT) 0.4 MG SL TABLET    Place 0.4 mg under the  tongue every 5 (five) minutes as needed. For chest pain   NUTRITIONAL SUPPLEMENTS (RESOURCE ARGINAID PO)    Take 1 packet by mouth 2 (two) times daily.   OMEPRAZOLE (PRILOSEC) 20 MG CAPSULE    Take 20 mg by mouth 2 (two) times daily.   OXYMETAZOLINE (AFRIN) 0.05 % NASAL SPRAY    Place 2 sprays into the nose 2 (two) times daily as needed. For congestion   POLYETHYLENE GLYCOL (MIRALAX / GLYCOLAX) PACKET    Take 17 g by mouth daily as needed.    PRAMOXINE-MINERAL OIL-ZINC (TUCKS) 1-12.5 % RECTAL OINTMENT    Place 1 application rectally as needed for hemorrhoids.   PROBIOTIC PRODUCT (ALIGN) 4 MG CAPS    Take 1 capsule by mouth daily.   PROPRANOLOL (INDERAL) 20 MG TABLET    Take 20 mg by mouth 2 (two) times daily.   SPIRONOLACTONE (ALDACTONE) 25 MG TABLET    Take 25 mg by mouth 2 (two) times daily.   VITAMIN D, ERGOCALCIFEROL, (DRISDOL) 50000 UNITS CAPS    Take 50,000 Units by mouth every 30 (thirty) days. On the first of the month  Modified Medications   No medications on file  Discontinued Medications   No medications on file     Physical Exam: Physical Exam  Constitutional: She appears well-developed.  HENT:  Head:  Normocephalic and atraumatic.  Left Ear: External ear normal.  Nasal congestion.   Eyes: Conjunctivae and EOM are normal. Pupils are equal, round, and reactive to light.  Neck: Neck supple. No JVD present. No thyromegaly present.  Cardiovascular: Normal rate and regular rhythm.   Murmur heard. Pulmonary/Chest: Effort normal. She has rales.  Bibasilar rales. Central congestion.   Abdominal: Soft. There is no tenderness. There is no rebound.  Musculoskeletal: She exhibits edema.  The right shoulder/upper arm and hip pain  Lymphadenopathy:    She has no cervical adenopathy.  Neurological: She is alert. No cranial nerve deficit. Coordination normal.  Skin: Skin is warm and dry. Rash noted.  Erythematous and chronic swelling in BLE. Left forefoot diabetic ulcer-larger and deeper in size-wound bed is clean with tunneling(priviously protruding center is flattened and is lower than the skin surface.  Intergluteal cleft skin breakdown with superficial skin missing and erythematous extended to the R+L 1/3 of inner buttocks--new pressure ulcers R+L buttocks near the intergluteal cleft with mirror imaging about a quarter seize stage II presently.  Beefy red buttocks extends to abd thighs, lower back with satellite pattern and excoriation-improving.   Psychiatric: Her mood appears anxious. Her affect is not blunt, not labile and not inappropriate. Her speech is rapid and/or pressured (occasionally ). Her speech is not delayed and not slurred. She is hyperactive. She is not agitated, not aggressive, not slowed, not withdrawn, not actively hallucinating and not combative. Thought content is not paranoid and not delusional. She does not exhibit a depressed mood. She exhibits abnormal recent memory.  MMSE 21/30 08/2013    Filed Vitals:   10/29/13 1344  BP: 129/89  Pulse: 63  Temp: 98.6 F (37 C)  TempSrc: Tympanic  Resp: 18      Labs reviewed: Basic Metabolic Panel:  Recent Labs  40/98/11 1852  01/24/13  05/27/13  07/20/13 1921  08/26/13  10/17/13 10/23/13 10/28/13  NA 139 139  < > 137  < > 136  < >  --   < > 136* 133* 134*  K 4.5 4.6  < > 4.5  < > 3.9  < >  --   < >  4.2 5.3 4.8  CL 98  --   --   --   --  97  --   --   --   --   --   --   CO2 29  --   --   --   --  30  --   --   --   --   --   --   GLUCOSE 255*  --   --   --   --  147*  --   --   --   --   --   --   BUN 47* 41*  < > 44*  < > 46*  < >  --   < > 23* 46* 35*  CREATININE 0.94 1.1  < > 1.3*  < > 1.00  < >  --   < > 0.9 1.2* 1.2*  CALCIUM 9.8  --   --   --   --  9.3  --   --   --   --   --   --   TSH 1.511 3.68  --  3.96  --   --   --  2.07  --   --   --   --   < > = values in this interval not displayed. Liver Function Tests:  Recent Labs  11/13/12 1852 07/20/13 1921 08/31/13 10/23/13  AST 21 19 20  34  ALT 19 12 13 16   ALKPHOS 74 62 82 62  BILITOT 0.4 0.3  --   --   PROT 7.5 7.0  --   --   ALBUMIN 3.6 2.8*  --   --    CBC:  Recent Labs  11/13/12 1852  07/20/13 1921 08/16/13 08/31/13 10/23/13  WBC 7.8  < > 9.3 9.0 8.5 6.9  NEUTROABS 5.8  --  6.5  --   --   --   HGB 13.6  < > 13.8 13.9 14.9 15.2  HCT 42.4  < > 39.7 40 44 44  MCV 98.1  --  97.1  --   --   --   PLT 196  < > 299 349 295 245  < > = values in this interval not displayed.  Past Procedures:  10/22/13 CXR patchy interstitial changes left lower lung most consistent with pneumonia    Assessment/Plan Acute renal disease Hemoconcentration-Hgb 15.2, AKD--Na 133, K 5.3, Bun 46, creatinine 1.25-held  Furosemide and Spironolactone for 3 days 10/24/13-persisted mild elevated creatinine 1.17 10/28/13. 10/29/13 continue to hold Furosemide/Spironolactone for 3 days--f/u BMP. Her oral intake is improving.     HCAP (healthcare-associated pneumonia) Improved Congestive cough, listlessness, CXR 10/22/13 showed patchy interstitial changes left lower lung most consistent with pneumonia. Will extend Levaquin 500mg  daily for total 10 days started  10/22/13.    Dementia with behavioral disturbance Her MMSE 21/3010/04/2013. Given her hx of cardiovascular issues--vascular dementia is more likely. Tolerated  Namenda and increased Cymbalta to 30mg  bid            Altered mental status Has returned to her baseline mentation.   A-fib Rate controlled. Propranolol was increased to 20mg  bid for better rate control in setting of HTN and essential tremor.                         Edema Chronic venous insufficiency and dermatitis. Better with Furosemide 80mg  am and 40mg  pm with  Spironolactone 25mg  bid since 06/27/13--Bun/creat 21/0.81 10/07/13 and  46/1.25 10/23/13.  Chronic LLE is bigger than the right                                         Unspecified constipation Frequent loose stools, takes colace 100 x2 qhs and Amitiza bid and MiraLax prn.                             HTN (hypertension) Controlled after Losartan was decreased to 25mg  daily, monitor blood pressure daily.                                            Back pain Chronic, pain is controlled with Fentanyl 191mcg/hr and Norco 10/325 q4hr                                    Essential tremor Mild head and hands tremor-not disabling. Takes Propranolol 10mg  bid                                         Depression Better with Cymbalta 30mg  bid and newly added Namenda. Observe the patient.                                   Ischemic heart disease Still had angina occasionally, f/u Cardiology, continue with Imdur 60mg  and Prn NTG(last dose 02/12/13)                Unspecified hypothyroidism Corrected with Levothyroxine 112.56mcg, last TSH 2.069 08/26/13                                      Type II  or unspecified type diabetes mellitus with peripheral circulatory disorders, uncontrolled(250.72) Closely monitor CBGs since the patient's oral intake is unpredictable since onset of PNA            Family/ Staff Communication: observe the patient.   Goals of Care: SNF  Labs/tests ordered:  BMP

## 2013-10-29 NOTE — Assessment & Plan Note (Signed)
Chronic venous insufficiency and dermatitis. Better with Furosemide 80mg  am and 40mg  pm with  Spironolactone 25mg  bid since 06/27/13--Bun/creat 21/0.81 10/07/13 and 46/1.25 10/23/13.  Chronic LLE is bigger than the right

## 2013-10-29 NOTE — Assessment & Plan Note (Signed)
Improved Congestive cough, listlessness, CXR 10/22/13 showed patchy interstitial changes left lower lung most consistent with pneumonia. Will extend Levaquin 500mg  daily for total 10 days started 10/22/13.

## 2013-10-29 NOTE — Assessment & Plan Note (Signed)
Chronic, pain is controlled with Fentanyl 100mcg/hr and Norco 10/325 q4hr                                         

## 2013-10-29 NOTE — Assessment & Plan Note (Signed)
Frequent loose stools, takes colace 100 x2 qhs and Amitiza 24mcg bid and MiraLax prn.                                    

## 2013-10-29 NOTE — Assessment & Plan Note (Signed)
Closely monitor CBGs since the patient's oral intake is unpredictable since onset of PNA

## 2013-10-29 NOTE — Assessment & Plan Note (Signed)
Hemoconcentration-Hgb 15.2, AKD--Na 133, K 5.3, Bun 46, creatinine 1.25-held  Furosemide and Spironolactone for 3 days 10/24/13-persisted mild elevated creatinine 1.17 10/28/13. 10/29/13 continue to hold Furosemide/Spironolactone for 3 days--f/u BMP. Her oral intake is improving.

## 2013-10-29 NOTE — Assessment & Plan Note (Signed)
Has returned to her baseline mentation.

## 2013-10-29 NOTE — Assessment & Plan Note (Signed)
Controlled after Losartan was decreased to 25mg daily, monitor blood pressure daily.                                                   

## 2013-10-29 NOTE — Assessment & Plan Note (Signed)
Still had angina occasionally, f/u Cardiology, continue with Imdur 60mg and Prn NTG(last dose 02/12/13)                         

## 2013-10-29 NOTE — Assessment & Plan Note (Signed)
Corrected with Levothyroxine 112.5mcg, last TSH 2.069 08/26/13    

## 2013-10-29 NOTE — Assessment & Plan Note (Signed)
Her MMSE 21/3010/04/2013. Given her hx of cardiovascular issues--vascular dementia is more likely. Tolerated  Namenda and increased Cymbalta to 30mg bid             

## 2013-11-04 LAB — BASIC METABOLIC PANEL
Glucose: 130 mg/dL
Potassium: 5.3 mmol/L (ref 3.4–5.3)

## 2013-11-05 ENCOUNTER — Non-Acute Institutional Stay (SKILLED_NURSING_FACILITY): Payer: Medicare Other | Admitting: Nurse Practitioner

## 2013-11-05 ENCOUNTER — Encounter: Payer: Self-pay | Admitting: Nurse Practitioner

## 2013-11-05 DIAGNOSIS — N289 Disorder of kidney and ureter, unspecified: Secondary | ICD-10-CM

## 2013-11-05 DIAGNOSIS — M549 Dorsalgia, unspecified: Secondary | ICD-10-CM

## 2013-11-05 DIAGNOSIS — J189 Pneumonia, unspecified organism: Secondary | ICD-10-CM

## 2013-11-05 DIAGNOSIS — E1159 Type 2 diabetes mellitus with other circulatory complications: Secondary | ICD-10-CM

## 2013-11-05 DIAGNOSIS — I4891 Unspecified atrial fibrillation: Secondary | ICD-10-CM

## 2013-11-05 DIAGNOSIS — G250 Essential tremor: Secondary | ICD-10-CM

## 2013-11-05 DIAGNOSIS — R5381 Other malaise: Secondary | ICD-10-CM

## 2013-11-05 DIAGNOSIS — F329 Major depressive disorder, single episode, unspecified: Secondary | ICD-10-CM

## 2013-11-05 DIAGNOSIS — I10 Essential (primary) hypertension: Secondary | ICD-10-CM

## 2013-11-05 DIAGNOSIS — K5900 Constipation, unspecified: Secondary | ICD-10-CM

## 2013-11-05 DIAGNOSIS — E039 Hypothyroidism, unspecified: Secondary | ICD-10-CM

## 2013-11-05 DIAGNOSIS — E871 Hypo-osmolality and hyponatremia: Secondary | ICD-10-CM

## 2013-11-05 NOTE — Assessment & Plan Note (Signed)
Controlled after Losartan was decreased to 25mg daily, monitor blood pressure daily.                                                   

## 2013-11-05 NOTE — Progress Notes (Signed)
Patient ID: [name], female   DOB: 08/04/1926, 79 y.o.   MRN: [id number]   Code Status: DNR  Allergies  Allergen Reactions  . Penicillins Other (See Comments)    Per MAR  . Bee Venom Other (See Comments)    Per MAR  . Celebrex [Celecoxib] Other (See Comments)    Per MAR  . Lidocaine Hcl Other (See Comments)    Per MAR  . Phenobarbital Other (See Comments)    Per MAR  . Procaine Hcl Other (See Comments)    Per mar    Chief Complaint  Patient presents with  . Medical Managment of Chronic Issues    blood sugar, hyponatremia, prerenal azotimia.   . Acute Visit    HPI: Patient is a 79 y.o. female seen in the SNF at [facility] today for evaluation of PNA, pre-renal azotemia, hyponatremia, blood sugar, and other chronic medical conditions.  Problem List Items Addressed This Visit   Unspecified hypothyroidism (Chronic)     Corrected with Levothyroxine 112.80mcg, last TSH 2.069 08/26/13                                          Depression     Better with Cymbalta 30mg  bid and newly added Namenda. Observe the patient.                                       Essential tremor     Mild head and hands tremor-not disabling. Takes Propranolol 10mg  bid                                             Back pain     Chronic, pain is controlled with Fentanyl 157mcg/hr and Norco 10/325 q4hr                                        HTN (hypertension)     Controlled after Losartan was decreased to 25mg  daily, monitor blood pressure daily.                                                Unspecified constipation     Frequent loose stools, takes colace 100 x2 qhs and Amitiza bid and MiraLax prn.                                 A-fib     Rate controlled. Propranolol was  increased to 20mg  bid for better rate control in setting of HTN and essential tremor.                             Other malaise and fatigue     Persists since the onset of PNA about 2 weeks ago.     HCAP (healthcare-associated pneumonia)     Fully treated with total 10 day course  of Avelox and clinically healed.     Acute renal disease - Primary     Persisted, Bun/creat 46/1.25 10/23/13,  35/1.17 10/28/13 and 51/1.28 11/04/13 after Furosemide and Spironolactone held x 3 days since 12/11/4. Will decrease Furosemide to 40mg  bid and Spironolactone to 25mg  daily, f/u BMP in one week. BLE edema 1+(much better than prior) and only dry rales appreciated bibasilar.     Hyponatremia     Mild serum Na 132 11/04/13--will reduce Furosemide to 40mg  bid and Spironolactone to 25mg  daily. F/u BMP in one week. Other contributing factors: insufficient oral intake, PNA, Cymbalta.        Review of Systems:  Review of Systems  Constitutional: Positive for malaise/fatigue. Negative for fever, chills, weight loss and diaphoresis.  HENT: Positive for congestion and hearing loss. Negative for ear discharge and sore throat.   Eyes: Negative for pain, discharge and redness.  Respiratory: Positive for cough. Negative for sputum production, shortness of breath and wheezing.   Cardiovascular: Positive for leg swelling and PND. Negative for chest pain, palpitations, orthopnea and claudication.  Gastrointestinal: Negative for heartburn, nausea, vomiting, abdominal pain, diarrhea, constipation and blood in stool.       Frequent loose stools.   Genitourinary: Positive for frequency. Negative for dysuria, urgency and flank pain.  Musculoskeletal: Positive for back pain, falls, joint pain and myalgias. Negative for neck pain.       C/o the right shoulder, upper arm, hip pain from fall last night 08/12/13. Able to ambulate with walker.   Skin: Positive for rash (chronic BLE venous dermatitis. Left  medial forefoot diabetic foot ulcer). Negative for itching.       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 Intergluteal cleft erythema and superficial skin missing-worse-beefy red extends to thighs/abd/lower back-improving.  Left forefoot diabetic ulcer--chronic about a quarter size. Pressure ulcers at R+L buttocks-in mirror image-2x2cm and superficial.   Neurological: Positive for tremors. Negative for dizziness, tingling, sensory change, speech change, focal weakness, seizures, loss of consciousness, weakness and headaches.  Endo/Heme/Allergies: Negative for environmental allergies and polydipsia. Does not bruise/bleed easily.  Psychiatric/Behavioral: Positive for depression and memory loss. Negative for hallucinations. The patient is nervous/anxious and has insomnia.        Confusion.      Past Medical History  Diagnosis Date  . Diabetes mellitus   . Hypertension   . Heart murmur   . Benign familial tremor   . Rosacea   . Diverticulitis   . Heart failure     diastolic heart failure  . TIA (transient ischemic attack) 2010  . Arthritis   . Breast cancer     breast -rt  . CAD (coronary artery disease)     single vessel CAD per cath in 2006; scattered nonobstructive disease in the left system, left to right collaterals to the RCA which was occluded by flush shots; she has been managed medically   Past Surgical History  Procedure Laterality Date  . Mastectomy partial / lumpectomy  11/02/2000    Right, with SLN  . Mastectomy partial / lumpectomy  05/15/2008    Left  . Breast lumpectomy  01/19/2010    right  . Mastectomy  03/31/2010    Right   Social History:   reports that she has never smoked. She does not have any smokeless tobacco history on file. She reports that she does not drink alcohol or use illicit drugs.  Medications: Patient's Medications  New Prescriptions   No medications on file  Previous Medications  ACETAMINOPHEN (MAPAP) 325 MG TABLET    Take 650 mg by mouth 2 (two)  times daily.   ALOE-SODIUM CHLORIDE (AYR SALINE NASAL GEL NA)    Place 1 application into the nose 2 (two) times daily. Both nostrils   ASPIRIN 81 MG CHEWABLE TABLET    Chew 81 mg by mouth every morning.    DOCUSATE SODIUM (COLACE) 100 MG CAPSULE    Take 100 mg by mouth 2 (two) times daily.    DORZOLAMIDE-TIMOLOL (COSOPT) 22.3-6.8 MG/ML OPHTHALMIC SOLUTION    Place 1 drop into the left eye every 12 (twelve) hours.   DULOXETINE (CYMBALTA) 30 MG CAPSULE    Take 30 mg by mouth 2 (two) times daily.   FENTANYL (DURAGESIC - DOSED MCG/HR) 100 MCG/HR    Apply 1 patch every 3 hours. Change site   FUROSEMIDE (LASIX) 40 MG TABLET    Take 40 mg by mouth 2 (two) times daily. 80 mg in the am and 40 mg at lunch (2 pm)   HYDROCODONE-ACETAMINOPHEN (NORCO) 10-325 MG PER TABLET    Take 1/2 tablet by mouth every 4 hours for pain   HYDROXYZINE (ATARAX/VISTARIL) 25 MG TABLET    Take 25 mg by mouth at bedtime as needed. For itching   INSULIN ASPART (NOVOLOG) 100 UNIT/ML INJECTION    Inject 8 Units into the skin 2 (two) times daily. If CBG is greater than 150   INSULIN GLARGINE (LANTUS) 100 UNIT/ML INJECTION    Inject 24 Units into the skin at bedtime.    ISOSORBIDE MONONITRATE (IMDUR) 60 MG 24 HR TABLET    Take 60 mg by mouth every morning.    LEVOTHYROXINE (SYNTHROID, LEVOTHROID) 75 MCG TABLET    Take 75-112.5 mcg by mouth daily. Take 1.5 tablets (112.47mcg) on Saturday. Take 1 tablet (75 mcg) daily all other days of the week   LORATADINE (CLARITIN) 10 MG TABLET    Take 10 mg by mouth daily.    LORAZEPAM (ATIVAN) 0.5 MG TABLET    Take 1 mg by mouth every 12 (twelve) hours as needed. Severe agitation   LOSARTAN (COZAAR) 50 MG TABLET    Take 50 mg by mouth daily.   LUBIPROSTONE (AMITIZA) 24 MCG CAPSULE    Take 24 mcg by mouth 2 (two) times daily with a meal.     MEMANTINE (NAMENDA) 10 MG TABLET    Take 10 mg by mouth 2 (two) times daily.   MULTIPLE VITAMINS-MINERALS (CERTA-VITE-LUTEIN PO)    Take 1 tablet by mouth daily.     NITROGLYCERIN (NITROSTAT) 0.4 MG SL TABLET    Place 0.4 mg under the tongue every 5 (five) minutes as needed. For chest pain   NUTRITIONAL SUPPLEMENTS (RESOURCE ARGINAID PO)    Take 1 packet by mouth 2 (two) times daily.   OMEPRAZOLE (PRILOSEC) 20 MG CAPSULE    Take 20 mg by mouth 2 (two) times daily.   OXYMETAZOLINE (AFRIN) 0.05 % NASAL SPRAY    Place 2 sprays into the nose 2 (two) times daily as needed. For congestion   POLYETHYLENE GLYCOL (MIRALAX / GLYCOLAX) PACKET    Take 17 g by mouth daily as needed.    PRAMOXINE-MINERAL OIL-ZINC (TUCKS) 1-12.5 % RECTAL OINTMENT    Place 1 application rectally as needed for hemorrhoids.   PROBIOTIC PRODUCT (ALIGN) 4 MG CAPS    Take 1 capsule by mouth daily.   PROPRANOLOL (INDERAL) 20 MG TABLET    Take 20 mg by mouth 2 (two) times daily.  SPIRONOLACTONE (ALDACTONE) 25 MG TABLET    Take 25 mg by mouth daily.    VITAMIN D, ERGOCALCIFEROL, (DRISDOL) 50000 UNITS CAPS    Take 50,000 Units by mouth every 30 (thirty) days. On the first of the month  Modified Medications   No medications on file  Discontinued Medications   No medications on file     Physical Exam: Physical Exam  Constitutional: She appears well-developed.  HENT:  Head: Normocephalic and atraumatic.  Left Ear: External ear normal.  Nasal congestion.   Eyes: Conjunctivae and EOM are normal. Pupils are equal, round, and reactive to light.  Neck: Neck supple. No JVD present. No thyromegaly present.  Cardiovascular: Normal rate and regular rhythm.   Murmur heard. Pulmonary/Chest: Effort normal. She has rales.  Bibasilar rales. Central congestion.   Abdominal: Soft. There is no tenderness. There is no rebound.  Musculoskeletal: She exhibits edema.  The right shoulder/upper arm and hip pain  Lymphadenopathy:    She has no cervical adenopathy.  Neurological: She is alert. No cranial nerve deficit. Coordination normal.  Skin: Skin is warm and dry. Rash noted.  Erythematous and chronic  swelling in BLE. Left forefoot diabetic ulcer-larger and deeper in size-wound bed is clean with tunneling(priviously protruding center is flattened and is lower than the skin surface.  Intergluteal cleft skin breakdown with superficial skin missing and erythematous extended to the R+L 1/3 of inner buttocks--new pressure ulcers R+L buttocks near the intergluteal cleft with mirror imaging about a quarter seize stage II presently.  Beefy red buttocks extends to abd thighs, lower back with satellite pattern and excoriation-improving.   Psychiatric: Her mood appears anxious. Her affect is not blunt, not labile and not inappropriate. Her speech is rapid and/or pressured (occasionally ). Her speech is not delayed and not slurred. She is hyperactive. She is not agitated, not aggressive, not slowed, not withdrawn, not actively hallucinating and not combative. Thought content is not paranoid and not delusional. She does not exhibit a depressed mood. She exhibits abnormal recent memory.  MMSE 21/30 08/2013    Filed Vitals:   11/05/13 1305  BP: 143/84  Pulse: 82  Temp: 97.7 F (36.5 C)  TempSrc: Tympanic  Resp: 20      Labs reviewed: Basic Metabolic Panel:  Recent Labs  16/10/96 1852 01/24/13  05/27/13  07/20/13 1921  08/26/13  10/23/13 10/28/13 11/04/13  NA 139 139  < > 137  < > 136  < >  --   < > 133* 134* 132*  K 4.5 4.6  < > 4.5  < > 3.9  < >  --   < > 5.3 4.8 5.3  CL 98  --   --   --   --  97  --   --   --   --   --   --   CO2 29  --   --   --   --  30  --   --   --   --   --   --   GLUCOSE 255*  --   --   --   --  147*  --   --   --   --   --   --   BUN 47* 41*  < > 44*  < > 46*  < >  --   < > 46* 35* 51*  CREATININE 0.94 1.1  < > 1.3*  < > 1.00  < >  --   < >  1.2* 1.2* 1.3*  CALCIUM 9.8  --   --   --   --  9.3  --   --   --   --   --   --   TSH 1.511 3.68  --  3.96  --   --   --  2.07  --   --   --   --   < > = values in this interval not displayed. Liver Function Tests:  Recent Labs   11/13/12 1852 07/20/13 1921 08/31/13 10/23/13  AST 21 19 20  34  ALT 19 12 13 16   ALKPHOS 74 62 82 62  BILITOT 0.4 0.3  --   --   PROT 7.5 7.0  --   --   ALBUMIN 3.6 2.8*  --   --    CBC:  Recent Labs  11/13/12 1852  07/20/13 1921 08/16/13 08/31/13 10/23/13  WBC 7.8  < > 9.3 9.0 8.5 6.9  NEUTROABS 5.8  --  6.5  --   --   --   HGB 13.6  < > 13.8 13.9 14.9 15.2  HCT 42.4  < > 39.7 40 44 44  MCV 98.1  --  97.1  --   --   --   PLT 196  < > 299 349 295 245  < > = values in this interval not displayed.  Past Procedures:  10/22/13 CXR patchy interstitial changes left lower lung most consistent with pneumonia   Assessment/Plan HCAP (healthcare-associated pneumonia) Fully treated with total 10 day course of Avelox and clinically healed.   Acute renal disease Persisted, Bun/creat 46/1.25 10/23/13,  35/1.17 10/28/13 and 51/1.28 11/04/13 after Furosemide and Spironolactone held x 3 days since 12/11/4. Will decrease Furosemide to 40mg  bid and Spironolactone to 25mg  daily, f/u BMP in one week. BLE edema 1+(much better than prior) and only dry rales appreciated bibasilar.   Other malaise and fatigue Persists since the onset of PNA about 2 weeks ago.   A-fib Rate controlled. Propranolol was increased to 20mg  bid for better rate control in setting of HTN and essential tremor.                           Unspecified constipation Frequent loose stools, takes colace 100 x2 qhs and Amitiza bid and MiraLax prn.                               HTN (hypertension) Controlled after Losartan was decreased to 25mg  daily, monitor blood pressure daily.                                              Back pain Chronic, pain is controlled with Fentanyl 156mcg/hr and Norco 10/325 q4hr                                      Essential tremor Mild head and hands tremor-not  disabling. Takes Propranolol 10mg  bid                                           Depression Better  with Cymbalta 30mg  bid and newly added Namenda. Observe the patient.                                     Unspecified hypothyroidism Corrected with Levothyroxine 112.55mcg, last TSH 2.069 08/26/13                                        Hyponatremia Mild serum Na 132 11/04/13--will reduce Furosemide to 40mg  bid and Spironolactone to 25mg  daily. F/u BMP in one week. Other contributing factors: insufficient oral intake, PNA, Cymbalta.     Family/ Staff Communication: observe the patient.   Goals of Care: SNF  Labs/tests ordered:  BMP one week. Hgb A1c in one month.

## 2013-11-05 NOTE — Assessment & Plan Note (Signed)
Rate controlled. Propranolol was increased to 20mg bid for better rate control in setting of HTN and essential tremor.                                

## 2013-11-05 NOTE — Assessment & Plan Note (Signed)
CBGs am 113-320, lunch 105-276, dinner 81-331. Update Hgb A1c

## 2013-11-05 NOTE — Assessment & Plan Note (Signed)
Persisted, Bun/creat 46/1.25 10/23/13,  35/1.17 10/28/13 and 51/1.28 11/04/13 after Furosemide and Spironolactone held x 3 days since 12/11/4. Will decrease Furosemide to 40mg  bid and Spironolactone to 25mg  daily, f/u BMP in one week. BLE edema 1+(much better than prior) and only dry rales appreciated bibasilar.

## 2013-11-05 NOTE — Assessment & Plan Note (Signed)
Mild serum Na 132 11/04/13--will reduce Furosemide to 40mg  bid and Spironolactone to 25mg  daily. F/u BMP in one week. Other contributing factors: insufficient oral intake, PNA, Cymbalta.

## 2013-11-05 NOTE — Assessment & Plan Note (Signed)
Fully treated with total 10 day course of Avelox and clinically healed.

## 2013-11-05 NOTE — Assessment & Plan Note (Signed)
Mild head and hands tremor-not disabling. Takes Propranolol 10mg bid                                              

## 2013-11-05 NOTE — Assessment & Plan Note (Signed)
Corrected with Levothyroxine 112.5mcg, last TSH 2.069 08/26/13    

## 2013-11-05 NOTE — Assessment & Plan Note (Signed)
Chronic, pain is controlled with Fentanyl 100mcg/hr and Norco 10/325 q4hr                                         

## 2013-11-05 NOTE — Assessment & Plan Note (Signed)
Frequent loose stools, takes colace 100 x2 qhs and Amitiza 24mcg bid and MiraLax prn.                                    

## 2013-11-05 NOTE — Assessment & Plan Note (Signed)
Persists since the onset of PNA about 2 weeks ago.

## 2013-11-05 NOTE — Assessment & Plan Note (Signed)
Better with Cymbalta 30mg bid and newly added Namenda. Observe the patient.    

## 2013-11-11 LAB — BASIC METABOLIC PANEL
BUN: 31 mg/dL — AB (ref 4–21)
Creatinine: 1 mg/dL (ref 0.5–1.1)
Glucose: 103 mg/dL
Potassium: 4.3 mmol/L (ref 3.4–5.3)
Sodium: 135 mmol/L — AB (ref 137–147)

## 2013-11-12 ENCOUNTER — Encounter: Payer: Self-pay | Admitting: Nurse Practitioner

## 2013-11-12 ENCOUNTER — Non-Acute Institutional Stay (SKILLED_NURSING_FACILITY): Payer: Medicare Other | Admitting: Nurse Practitioner

## 2013-11-12 DIAGNOSIS — M549 Dorsalgia, unspecified: Secondary | ICD-10-CM

## 2013-11-12 DIAGNOSIS — J189 Pneumonia, unspecified organism: Secondary | ICD-10-CM

## 2013-11-12 DIAGNOSIS — F03918 Unspecified dementia, unspecified severity, with other behavioral disturbance: Secondary | ICD-10-CM

## 2013-11-12 DIAGNOSIS — F329 Major depressive disorder, single episode, unspecified: Secondary | ICD-10-CM

## 2013-11-12 DIAGNOSIS — G250 Essential tremor: Secondary | ICD-10-CM

## 2013-11-12 DIAGNOSIS — I259 Chronic ischemic heart disease, unspecified: Secondary | ICD-10-CM

## 2013-11-12 DIAGNOSIS — M7989 Other specified soft tissue disorders: Secondary | ICD-10-CM

## 2013-11-12 DIAGNOSIS — K5900 Constipation, unspecified: Secondary | ICD-10-CM

## 2013-11-12 DIAGNOSIS — E1159 Type 2 diabetes mellitus with other circulatory complications: Secondary | ICD-10-CM

## 2013-11-12 DIAGNOSIS — F32A Depression, unspecified: Secondary | ICD-10-CM

## 2013-11-12 DIAGNOSIS — I10 Essential (primary) hypertension: Secondary | ICD-10-CM

## 2013-11-12 DIAGNOSIS — N289 Disorder of kidney and ureter, unspecified: Secondary | ICD-10-CM

## 2013-11-12 DIAGNOSIS — I4891 Unspecified atrial fibrillation: Secondary | ICD-10-CM

## 2013-11-12 DIAGNOSIS — F0391 Unspecified dementia with behavioral disturbance: Secondary | ICD-10-CM

## 2013-11-12 DIAGNOSIS — E039 Hypothyroidism, unspecified: Secondary | ICD-10-CM

## 2013-11-12 DIAGNOSIS — E871 Hypo-osmolality and hyponatremia: Secondary | ICD-10-CM

## 2013-11-12 NOTE — Assessment & Plan Note (Signed)
Resolved on  Furosemide to 40mg  bid and Spironolactone to 25mg  daily, serum Na 135 11/11/13

## 2013-11-12 NOTE — Assessment & Plan Note (Signed)
Frequent loose stools, takes colace 100 x2 qhs and Amitiza 24mcg bid and MiraLax prn.                                    

## 2013-11-12 NOTE — Assessment & Plan Note (Addendum)
CBG 50 ac dinner 11/10/13, 47 ac breakfast 11/11/13--will decrease Lantus to 16 units. Continue bolus Novolog 8 units bid. Monitor CBGs

## 2013-11-12 NOTE — Assessment & Plan Note (Signed)
Mild head and hands tremor-not disabling. Takes Propranolol 10mg bid                                              

## 2013-11-12 NOTE — Assessment & Plan Note (Signed)
Chronic venous insufficiency and dermatitis. Stable.                                             

## 2013-11-12 NOTE — Assessment & Plan Note (Signed)
Chronic, pain is controlled with Fentanyl 100mcg/hr and Norco 10/325 q4hr                                         

## 2013-11-12 NOTE — Progress Notes (Signed)
Patient ID: [name], female   DOB: 08-11-1926, 79 y.o.   MRN: [id number]   Code Status: DNR  Allergies  Allergen Reactions  . Penicillins Other (See Comments)    Per MAR  . Bee Venom Other (See Comments)    Per MAR  . Celebrex [Celecoxib] Other (See Comments)    Per MAR  . Lidocaine Hcl Other (See Comments)    Per MAR  . Phenobarbital Other (See Comments)    Per MAR  . Procaine Hcl Other (See Comments)    Per mar    Chief Complaint  Patient presents with  . Medical Managment of Chronic Issues    hypoglycemic episodes.   . Acute Visit    HPI: Patient is a 79 y.o. female seen in the SNF at [facility] today for evaluation of pre-renal azotemia, hyponatremia, blood sugar, and other chronic medical conditions.  Problem List Items Addressed This Visit   Type II or unspecified type diabetes mellitus with peripheral circulatory disorders, uncontrolled(250.72) - Primary (Chronic)     CBG 50 ac dinner 11/10/13, 47 ac breakfast 11/11/13--will decrease Lantus to 16 units. Continue bolus Novolog 8 units bid. Monitor CBGs      Unspecified hypothyroidism (Chronic)     Corrected with Levothyroxine 112.19mcg, last TSH 2.069 08/26/13                                            Ischemic heart disease     Still had angina occasionally, f/u Cardiology, continue with Imdur 60mg  and Prn NTG(last dose 02/12/13)                    Leg swelling     Chronic venous insufficiency and dermatitis. Stable.                                              Depression     Better with Cymbalta 30mg  bid and newly added Namenda. Observe the patient.                                         Essential tremor     Mild head and hands tremor-not disabling. Takes Propranolol 10mg  bid                                               Back pain       Chronic, pain is controlled with Fentanyl 19mcg/hr and Norco 10/325 q4hr                                          HTN (hypertension)     Controlled after Losartan was decreased to 25mg  daily, monitor blood pressure daily.  Unspecified constipation     Frequent loose stools, takes colace 100 x2 qhs and Amitiza bid and MiraLax prn.                                   A-fib     Rate controlled. Propranolol was increased to 20mg  bid for better rate control in setting of HTN and essential tremor.                               Dementia with behavioral disturbance     Her MMSE 21/3010/04/2013. Given her hx of cardiovascular issues--vascular dementia is more likely. Tolerated  Namenda and increased Cymbalta to 30mg  bid                HCAP (healthcare-associated pneumonia)     Resolved.       Acute renal disease     Improved. Bun/creat 31/0.98 11/11/13      Hyponatremia     Resolved on  Furosemide to 40mg  bid and Spironolactone to 25mg  daily, serum Na 135 11/11/13         Review of Systems:  Review of Systems  Constitutional: Negative for fever, chills, weight loss, malaise/fatigue and diaphoresis.  HENT: Positive for hearing loss. Negative for congestion, ear discharge and sore throat.   Eyes: Negative for pain, discharge and redness.  Respiratory: Negative for cough, sputum production, shortness of breath and wheezing.   Cardiovascular: Positive for leg swelling and PND. Negative for chest pain, palpitations, orthopnea and claudication.  Gastrointestinal: Negative for heartburn, nausea, vomiting, abdominal pain, diarrhea, constipation and blood in stool.       Frequent loose stools.   Genitourinary: Positive for frequency. Negative for dysuria, urgency and flank pain.   Musculoskeletal: Positive for back pain, falls and joint pain. Negative for myalgias and neck pain.       Able to ambulate with walker.   Skin: Positive for rash (chronic BLE venous dermatitis. Left medial forefoot diabetic foot ulcer). Negative for itching.       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 Intergluteal cleft erythema and superficial skin missing-worse-beefy red extends to thighs/abd/lower back-improving.  Left forefoot diabetic ulcer--chronic about a quarter size. Pressure ulcers at R+L buttocks-in mirror image-2x2cm and superficial.   Neurological: Positive for tremors. Negative for dizziness, tingling, sensory change, speech change, focal weakness, seizures, loss of consciousness, weakness and headaches.  Endo/Heme/Allergies: Negative for environmental allergies and polydipsia. Does not bruise/bleed easily.  Psychiatric/Behavioral: Positive for depression and memory loss. Negative for hallucinations. The patient is nervous/anxious and has insomnia.      Past Medical History  Diagnosis Date  . Diabetes mellitus   . Hypertension   . Heart murmur   . Benign familial tremor   . Rosacea   . Diverticulitis   . Heart failure     diastolic heart failure  . TIA (transient ischemic attack) 2010  . Arthritis   . Breast cancer     breast -rt  . CAD (coronary artery disease)     single vessel CAD per cath in 2006; scattered nonobstructive disease in the left system, left to right collaterals to the RCA which was occluded by flush shots; she has been managed medically   Past Surgical History  Procedure Laterality Date  . Mastectomy partial / lumpectomy  11/02/2000    Right, with SLN  . Mastectomy  partial / lumpectomy  05/15/2008    Left  . Breast lumpectomy  01/19/2010    right  . Mastectomy  03/31/2010    Right   Social History:   reports that she has never smoked. She does not have any smokeless tobacco history on file. She reports that she does not drink alcohol or use illicit  drugs.  Medications: Patient's Medications  New Prescriptions   No medications on file  Previous Medications   ACETAMINOPHEN (MAPAP) 325 MG TABLET    Take 650 mg by mouth 2 (two) times daily.   ALOE-SODIUM CHLORIDE (AYR SALINE NASAL GEL NA)    Place 1 application into the nose 2 (two) times daily. Both nostrils   ASPIRIN 81 MG CHEWABLE TABLET    Chew 81 mg by mouth every morning.    DOCUSATE SODIUM (COLACE) 100 MG CAPSULE    Take 100 mg by mouth 2 (two) times daily.    DORZOLAMIDE-TIMOLOL (COSOPT) 22.3-6.8 MG/ML OPHTHALMIC SOLUTION    Place 1 drop into the left eye every 12 (twelve) hours.   DULOXETINE (CYMBALTA) 30 MG CAPSULE    Take 30 mg by mouth 2 (two) times daily.   FENTANYL (DURAGESIC - DOSED MCG/HR) 100 MCG/HR    Apply 1 patch every 3 hours. Change site   FUROSEMIDE (LASIX) 40 MG TABLET    Take 40 mg by mouth 2 (two) times daily.    HYDROCODONE-ACETAMINOPHEN (NORCO) 10-325 MG PER TABLET    Take 1/2 tablet by mouth every 4 hours for pain   HYDROXYZINE (ATARAX/VISTARIL) 25 MG TABLET    Take 25 mg by mouth at bedtime as needed. For itching   INSULIN ASPART (NOVOLOG) 100 UNIT/ML INJECTION    Inject 8 Units into the skin 2 (two) times daily. If CBG is greater than 150   INSULIN GLARGINE (LANTUS) 100 UNIT/ML INJECTION    Inject 16 Units into the skin at bedtime.    ISOSORBIDE MONONITRATE (IMDUR) 60 MG 24 HR TABLET    Take 60 mg by mouth every morning.    LEVOTHYROXINE (SYNTHROID, LEVOTHROID) 75 MCG TABLET    Take 75-112.5 mcg by mouth daily. Take 1.5 tablets (112.73mcg) on Saturday. Take 1 tablet (75 mcg) daily all other days of the week   LORATADINE (CLARITIN) 10 MG TABLET    Take 10 mg by mouth daily.    LORAZEPAM (ATIVAN) 0.5 MG TABLET    Take 1 mg by mouth every 12 (twelve) hours as needed. Severe agitation   LOSARTAN (COZAAR) 50 MG TABLET    Take 50 mg by mouth daily.   LUBIPROSTONE (AMITIZA) 24 MCG CAPSULE    Take 24 mcg by mouth 2 (two) times daily with a meal.     MEMANTINE  (NAMENDA) 10 MG TABLET    Take 10 mg by mouth 2 (two) times daily.   MULTIPLE VITAMINS-MINERALS (CERTA-VITE-LUTEIN PO)    Take 1 tablet by mouth daily.    NITROGLYCERIN (NITROSTAT) 0.4 MG SL TABLET    Place 0.4 mg under the tongue every 5 (five) minutes as needed. For chest pain   NUTRITIONAL SUPPLEMENTS (RESOURCE ARGINAID PO)    Take 1 packet by mouth 2 (two) times daily.   OMEPRAZOLE (PRILOSEC) 20 MG CAPSULE    Take 20 mg by mouth 2 (two) times daily.   OXYMETAZOLINE (AFRIN) 0.05 % NASAL SPRAY    Place 2 sprays into the nose 2 (two) times daily as needed. For congestion   POLYETHYLENE GLYCOL (MIRALAX / GLYCOLAX) PACKET  Take 17 g by mouth daily as needed.    PRAMOXINE-MINERAL OIL-ZINC (TUCKS) 1-12.5 % RECTAL OINTMENT    Place 1 application rectally as needed for hemorrhoids.   PROBIOTIC PRODUCT (ALIGN) 4 MG CAPS    Take 1 capsule by mouth daily.   PROPRANOLOL (INDERAL) 20 MG TABLET    Take 20 mg by mouth 2 (two) times daily.   SPIRONOLACTONE (ALDACTONE) 25 MG TABLET    Take 25 mg by mouth daily.    VITAMIN D, ERGOCALCIFEROL, (DRISDOL) 50000 UNITS CAPS    Take 50,000 Units by mouth every 30 (thirty) days. On the first of the month  Modified Medications   No medications on file  Discontinued Medications   No medications on file     Physical Exam: Physical Exam  Constitutional: She appears well-developed.  HENT:  Head: Normocephalic and atraumatic.  Left Ear: External ear normal.  Eyes: Conjunctivae and EOM are normal. Pupils are equal, round, and reactive to light.  Neck: Neck supple. No JVD present. No thyromegaly present.  Cardiovascular: Normal rate and regular rhythm.   Murmur heard. Pulmonary/Chest: Effort normal. She has rales.  Bibasilar rales. Central congestion.   Abdominal: Soft. There is no tenderness. There is no rebound.  Musculoskeletal: She exhibits edema.  The right shoulder/upper arm and hip pain  Lymphadenopathy:    She has no cervical adenopathy.    Neurological: She is alert. No cranial nerve deficit. Coordination normal.  Skin: Skin is warm and dry. Rash noted.  Erythematous and chronic swelling in BLE. Left forefoot diabetic ulcer-larger and deeper in size-wound bed is clean with tunneling(priviously protruding center is flattened and is lower than the skin surface.  Intergluteal cleft skin breakdown with superficial skin missing and erythematous extended to the R+L 1/3 of inner buttocks--new pressure ulcers R+L buttocks near the intergluteal cleft with mirror imaging about a quarter seize stage II presently.  Beefy red buttocks extends to abd thighs, lower back with satellite pattern and excoriation-improving.   Psychiatric: Her mood appears anxious. Her affect is not blunt, not labile and not inappropriate. Her speech is rapid and/or pressured (occasionally ). Her speech is not delayed and not slurred. She is hyperactive. She is not agitated, not aggressive, not slowed, not withdrawn, not actively hallucinating and not combative. Thought content is not paranoid and not delusional. She does not exhibit a depressed mood. She exhibits abnormal recent memory.  MMSE 21/30 08/2013    Filed Vitals:   11/12/13 1712  BP: 160/80  Pulse: 68  Temp: 97.6 F (36.4 C)  TempSrc: Tympanic  Resp: 20      Labs reviewed: Basic Metabolic Panel:  Recent Labs  16/10/96 1852 01/24/13  05/27/13  07/20/13 1921  08/26/13  10/28/13 11/04/13 11/11/13  NA 139 139  < > 137  < > 136  < >  --   < > 134* 132* 135*  K 4.5 4.6  < > 4.5  < > 3.9  < >  --   < > 4.8 5.3 4.3  CL 98  --   --   --   --  97  --   --   --   --   --   --   CO2 29  --   --   --   --  30  --   --   --   --   --   --   GLUCOSE 255*  --   --   --   --  147*  --   --   --   --   --   --   BUN 47* 41*  < > 44*  < > 46*  < >  --   < > 35* 51* 31*  CREATININE 0.94 1.1  < > 1.3*  < > 1.00  < >  --   < > 1.2* 1.3* 1.0  CALCIUM 9.8  --   --   --   --  9.3  --   --   --   --   --   --   TSH  1.511 3.68  --  3.96  --   --   --  2.07  --   --   --   --   < > = values in this interval not displayed. Liver Function Tests:  Recent Labs  11/13/12 1852 07/20/13 1921 08/31/13 10/23/13  AST 21 19 20  34  ALT 19 12 13 16   ALKPHOS 74 62 82 62  BILITOT 0.4 0.3  --   --   PROT 7.5 7.0  --   --   ALBUMIN 3.6 2.8*  --   --    CBC:  Recent Labs  11/13/12 1852  07/20/13 1921 08/16/13 08/31/13 10/23/13  WBC 7.8  < > 9.3 9.0 8.5 6.9  NEUTROABS 5.8  --  6.5  --   --   --   HGB 13.6  < > 13.8 13.9 14.9 15.2  HCT 42.4  < > 39.7 40 44 44  MCV 98.1  --  97.1  --   --   --   PLT 196  < > 299 349 295 245  < > = values in this interval not displayed.  Past Procedures:  10/22/13 CXR patchy interstitial changes left lower lung most consistent with pneumonia   Assessment/Plan Type II or unspecified type diabetes mellitus with peripheral circulatory disorders, uncontrolled(250.72) CBG 50 ac dinner 11/10/13, 47 ac breakfast 11/11/13--will decrease Lantus to 16 units. Continue bolus Novolog 8 units bid. Monitor CBGs    Unspecified hypothyroidism Corrected with Levothyroxine 112.2mcg, last TSH 2.069 08/26/13                                          Ischemic heart disease Still had angina occasionally, f/u Cardiology, continue with Imdur 60mg  and Prn NTG(last dose 02/12/13)                  Leg swelling Chronic venous insufficiency and dermatitis. Stable.                                            Depression Better with Cymbalta 30mg  bid and newly added Namenda. Observe the patient.                                       Essential tremor Mild head and hands tremor-not disabling. Takes Propranolol 10mg  bid  Back pain Chronic, pain is controlled with Fentanyl 113mcg/hr and  Norco 10/325 q4hr                                        HTN (hypertension) Controlled after Losartan was decreased to 25mg  daily, monitor blood pressure daily.                                                Unspecified constipation Frequent loose stools, takes colace 100 x2 qhs and Amitiza bid and MiraLax prn.                                 A-fib Rate controlled. Propranolol was increased to 20mg  bid for better rate control in setting of HTN and essential tremor.                             Dementia with behavioral disturbance Her MMSE 21/3010/04/2013. Given her hx of cardiovascular issues--vascular dementia is more likely. Tolerated  Namenda and increased Cymbalta to 30mg  bid              HCAP (healthcare-associated pneumonia) Resolved.     Hyponatremia Resolved on  Furosemide to 40mg  bid and Spironolactone to 25mg  daily, serum Na 135 11/11/13    Acute renal disease Improved. Bun/creat 31/0.98 11/11/13      Family/ Staff Communication: observe the patient.   Goals of Care: SNF  Labs/tests ordered:  none

## 2013-11-12 NOTE — Assessment & Plan Note (Signed)
Rate controlled. Propranolol was increased to 20mg bid for better rate control in setting of HTN and essential tremor.                                

## 2013-11-12 NOTE — Assessment & Plan Note (Signed)
Resolved

## 2013-11-12 NOTE — Assessment & Plan Note (Signed)
Corrected with Levothyroxine 112.5mcg, last TSH 2.069 08/26/13    

## 2013-11-12 NOTE — Assessment & Plan Note (Signed)
Still had angina occasionally, f/u Cardiology, continue with Imdur 60mg and Prn NTG(last dose 02/12/13)                         

## 2013-11-12 NOTE — Assessment & Plan Note (Signed)
Controlled after Losartan was decreased to 25mg daily, monitor blood pressure daily.                                                   

## 2013-11-12 NOTE — Assessment & Plan Note (Signed)
Improved. Bun/creat 31/0.98 11/11/13

## 2013-11-12 NOTE — Assessment & Plan Note (Signed)
Her MMSE 21/3010/04/2013. Given her hx of cardiovascular issues--vascular dementia is more likely. Tolerated  Namenda and increased Cymbalta to 30mg bid             

## 2013-11-12 NOTE — Assessment & Plan Note (Signed)
Better with Cymbalta 30mg bid and newly added Namenda. Observe the patient.    

## 2013-11-21 LAB — CBC AND DIFFERENTIAL
HCT: 41 % (ref 36–46)
Hemoglobin: 13.7 g/dL (ref 12.0–16.0)
PLATELETS: 289 10*3/uL (ref 150–399)
WBC: 7.7 10*3/mL

## 2013-11-21 LAB — HEPATIC FUNCTION PANEL
ALT: 9 U/L (ref 7–35)
AST: 17 U/L (ref 13–35)
Alkaline Phosphatase: 67 U/L (ref 25–125)
BILIRUBIN, TOTAL: 0.4 mg/dL

## 2013-11-21 LAB — BASIC METABOLIC PANEL
BUN: 23 mg/dL — AB (ref 4–21)
CREATININE: 0.9 mg/dL (ref 0.5–1.1)
Glucose: 137 mg/dL
Potassium: 4.2 mmol/L (ref 3.4–5.3)
Sodium: 139 mmol/L (ref 137–147)

## 2013-11-22 ENCOUNTER — Non-Acute Institutional Stay (SKILLED_NURSING_FACILITY): Payer: Medicare Other | Admitting: Nurse Practitioner

## 2013-11-22 ENCOUNTER — Encounter: Payer: Self-pay | Admitting: Nurse Practitioner

## 2013-11-22 DIAGNOSIS — N289 Disorder of kidney and ureter, unspecified: Secondary | ICD-10-CM

## 2013-11-22 DIAGNOSIS — R609 Edema, unspecified: Secondary | ICD-10-CM

## 2013-11-22 DIAGNOSIS — E871 Hypo-osmolality and hyponatremia: Secondary | ICD-10-CM

## 2013-11-22 DIAGNOSIS — E039 Hypothyroidism, unspecified: Secondary | ICD-10-CM

## 2013-11-22 DIAGNOSIS — F3289 Other specified depressive episodes: Secondary | ICD-10-CM

## 2013-11-22 DIAGNOSIS — E1159 Type 2 diabetes mellitus with other circulatory complications: Secondary | ICD-10-CM

## 2013-11-22 DIAGNOSIS — M549 Dorsalgia, unspecified: Secondary | ICD-10-CM

## 2013-11-22 DIAGNOSIS — K5900 Constipation, unspecified: Secondary | ICD-10-CM

## 2013-11-22 DIAGNOSIS — I10 Essential (primary) hypertension: Secondary | ICD-10-CM

## 2013-11-22 DIAGNOSIS — F03918 Unspecified dementia, unspecified severity, with other behavioral disturbance: Secondary | ICD-10-CM

## 2013-11-22 DIAGNOSIS — G250 Essential tremor: Secondary | ICD-10-CM

## 2013-11-22 DIAGNOSIS — F32A Depression, unspecified: Secondary | ICD-10-CM

## 2013-11-22 DIAGNOSIS — I4891 Unspecified atrial fibrillation: Secondary | ICD-10-CM

## 2013-11-22 DIAGNOSIS — R4182 Altered mental status, unspecified: Secondary | ICD-10-CM

## 2013-11-22 DIAGNOSIS — J189 Pneumonia, unspecified organism: Secondary | ICD-10-CM

## 2013-11-22 DIAGNOSIS — F0391 Unspecified dementia with behavioral disturbance: Secondary | ICD-10-CM

## 2013-11-22 DIAGNOSIS — G252 Other specified forms of tremor: Secondary | ICD-10-CM

## 2013-11-22 DIAGNOSIS — I259 Chronic ischemic heart disease, unspecified: Secondary | ICD-10-CM

## 2013-11-22 DIAGNOSIS — F329 Major depressive disorder, single episode, unspecified: Secondary | ICD-10-CM

## 2013-11-22 NOTE — Progress Notes (Signed)
Patient ID: [name], female   DOB: 1926-02-14, 80 y.o.   MRN: [id number]   Code Status: DNR  Allergies  Allergen Reactions  . Penicillins Other (See Comments)    Per MAR  . Bee Venom Other (See Comments)    Per MAR  . Celebrex [Celecoxib] Other (See Comments)    Per MAR  . Lidocaine Hcl Other (See Comments)    Per MAR  . Phenobarbital Other (See Comments)    Per MAR  . Procaine Hcl Other (See Comments)    Per mar    Chief Complaint  Patient presents with  . Medical Managment of Chronic Issues    confusion.   . Acute Visit    HPI: Patient is a 80 y.o. female seen in the SNF at [facility] today for evaluation of acute onset of confusion-resolved today and other chronic medical conditions.  Problem List Items Addressed This Visit   Type II or unspecified type diabetes mellitus with peripheral circulatory disorders, uncontrolled(250.72) (Chronic)     No further hypoglycemic episodes since Lantus16 units. Continue bolus Novolog 8 units bid. Monitor CBGs        Unspecified hypothyroidism (Chronic)     Corrected with Levothyroxine 112.75mcg, last TSH 2.069 08/26/13                                              Ischemic heart disease     Still had angina occasionally, f/u Cardiology, continue with Imdur 60mg  and Prn NTG(last dose 02/12/13)                      Depression     Better with Cymbalta 30mg  bid and newly added Namenda. Observe the patient.                                           Essential tremor     Mild head and hands tremor-not disabling. Takes Propranolol 10mg  bid                                                 Back pain     Chronic, pain is controlled with Fentanyl 118mcg/hr and Norco 10/325 q4hr                                            HTN (hypertension)     Controlled  after Losartan was decreased to 25mg  daily, monitor blood pressure daily.                                                    Unspecified constipation     Frequent loose stools, takes colace 100 x2 qhs and Amitiza 75mcg bid and MiraLax prn.  Edema     Chronic venous insufficiency and dermatitis. Stable.                                                A-fib     Rate controlled. Propranolol was increased to 20mg  bid for better rate control in setting of HTN and essential tremor.                                 Altered mental status - Primary     Onset 11/21/13--didn't recognize her husband-resolved today, no focal neurological deficits noted, CBC, BMP, CXR, UA unremarkable. Will continue to monitor the patient.     Dementia with behavioral disturbance     Her MMSE 21/3010/04/2013. Given her hx of cardiovascular issues--vascular dementia is more likely. Tolerated  Namenda and increased Cymbalta to 30mg  bid                HCAP (healthcare-associated pneumonia)     Fully treated clinically and radio graphically resolved.     Acute renal disease     Resolved.     Hyponatremia     Resolved.        Review of Systems:  Review of Systems  Constitutional: Negative for fever, chills, weight loss, malaise/fatigue and diaphoresis.  HENT: Positive for hearing loss. Negative for congestion, ear discharge and sore throat.   Eyes: Negative for pain, discharge and redness.  Respiratory: Negative for cough, sputum production, shortness of breath and wheezing.   Cardiovascular: Positive for leg swelling and PND. Negative for chest pain, palpitations, orthopnea and claudication.  Gastrointestinal: Negative for heartburn, nausea, vomiting, abdominal pain, diarrhea, constipation and blood in stool.       Frequent  loose stools.   Genitourinary: Positive for frequency. Negative for dysuria, urgency and flank pain.  Musculoskeletal: Positive for back pain, falls and joint pain. Negative for myalgias and neck pain.       Able to ambulate with walker.   Skin: Positive for rash (chronic BLE venous dermatitis. Left medial forefoot diabetic foot ulcer). Negative for itching.       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 Intergluteal cleft erythema and superficial skin missing-worse-beefy red extends to thighs/abd/lower back-improving.  Left forefoot diabetic ulcer--chronic about a quarter size. Pressure ulcers at R+L buttocks-in mirror image-2x2cm and superficial.   Neurological: Positive for tremors. Negative for dizziness, tingling, sensory change, speech change, focal weakness, seizures, loss of consciousness, weakness and headaches.  Endo/Heme/Allergies: Negative for environmental allergies and polydipsia. Does not bruise/bleed easily.  Psychiatric/Behavioral: Positive for depression and memory loss. Negative for hallucinations. The patient is nervous/anxious and has insomnia.      Past Medical History  Diagnosis Date  . Diabetes mellitus   . Hypertension   . Heart murmur   . Benign familial tremor   . Rosacea   . Diverticulitis   . Heart failure     diastolic heart failure  . TIA (transient ischemic attack) 2010  . Arthritis   . Breast cancer     breast -rt  . CAD (coronary artery disease)     single vessel CAD per cath in 2006; scattered nonobstructive disease in the left system, left to right collaterals to the RCA which was occluded by flush shots; she has been managed medically  Past Surgical History  Procedure Laterality Date  . Mastectomy partial / lumpectomy  11/02/2000    Right, with SLN  . Mastectomy partial / lumpectomy  05/15/2008    Left  . Breast lumpectomy  01/19/2010    right  . Mastectomy  03/31/2010    Right   Social History:   reports that she has never smoked. She does not have  any smokeless tobacco history on file. She reports that she does not drink alcohol or use illicit drugs.  Medications: Patient's Medications  New Prescriptions   No medications on file  Previous Medications   ACETAMINOPHEN (MAPAP) 325 MG TABLET    Take 650 mg by mouth 2 (two) times daily.   ALOE-SODIUM CHLORIDE (AYR SALINE NASAL GEL NA)    Place 1 application into the nose 2 (two) times daily. Both nostrils   ASPIRIN 81 MG CHEWABLE TABLET    Chew 81 mg by mouth every morning.    DOCUSATE SODIUM (COLACE) 100 MG CAPSULE    Take 100 mg by mouth 2 (two) times daily.    DORZOLAMIDE-TIMOLOL (COSOPT) 22.3-6.8 MG/ML OPHTHALMIC SOLUTION    Place 1 drop into the left eye every 12 (twelve) hours.   DULOXETINE (CYMBALTA) 30 MG CAPSULE    Take 30 mg by mouth 2 (two) times daily.   FENTANYL (DURAGESIC - DOSED MCG/HR) 100 MCG/HR    Apply 1 patch every 3 hours. Change site   FUROSEMIDE (LASIX) 40 MG TABLET    Take 40 mg by mouth 2 (two) times daily.    HYDROCODONE-ACETAMINOPHEN (NORCO) 10-325 MG PER TABLET    Take 1/2 tablet by mouth every 4 hours for pain   HYDROXYZINE (ATARAX/VISTARIL) 25 MG TABLET    Take 25 mg by mouth at bedtime as needed. For itching   INSULIN ASPART (NOVOLOG) 100 UNIT/ML INJECTION    Inject 8 Units into the skin 2 (two) times daily. If CBG is greater than 150   INSULIN GLARGINE (LANTUS) 100 UNIT/ML INJECTION    Inject 16 Units into the skin at bedtime.    ISOSORBIDE MONONITRATE (IMDUR) 60 MG 24 HR TABLET    Take 60 mg by mouth every morning.    LEVOTHYROXINE (SYNTHROID, LEVOTHROID) 75 MCG TABLET    Take 75-112.5 mcg by mouth daily. Take 1.5 tablets (112.14mcg) on Saturday. Take 1 tablet (75 mcg) daily all other days of the week   LORATADINE (CLARITIN) 10 MG TABLET    Take 10 mg by mouth daily.    LORAZEPAM (ATIVAN) 0.5 MG TABLET    Take 1 mg by mouth every 12 (twelve) hours as needed. Severe agitation   LOSARTAN (COZAAR) 50 MG TABLET    Take 50 mg by mouth daily.   LUBIPROSTONE  (AMITIZA) 24 MCG CAPSULE    Take 24 mcg by mouth 2 (two) times daily with a meal.     MEMANTINE (NAMENDA) 10 MG TABLET    Take 10 mg by mouth 2 (two) times daily.   MULTIPLE VITAMINS-MINERALS (CERTA-VITE-LUTEIN PO)    Take 1 tablet by mouth daily.    NITROGLYCERIN (NITROSTAT) 0.4 MG SL TABLET    Place 0.4 mg under the tongue every 5 (five) minutes as needed. For chest pain   NUTRITIONAL SUPPLEMENTS (RESOURCE ARGINAID PO)    Take 1 packet by mouth 2 (two) times daily.   OMEPRAZOLE (PRILOSEC) 20 MG CAPSULE    Take 20 mg by mouth 2 (two) times daily.   OXYMETAZOLINE (AFRIN) 0.05 % NASAL SPRAY  Place 2 sprays into the nose 2 (two) times daily as needed. For congestion   POLYETHYLENE GLYCOL (MIRALAX / GLYCOLAX) PACKET    Take 17 g by mouth daily as needed.    PRAMOXINE-MINERAL OIL-ZINC (TUCKS) 1-12.5 % RECTAL OINTMENT    Place 1 application rectally as needed for hemorrhoids.   PROBIOTIC PRODUCT (ALIGN) 4 MG CAPS    Take 1 capsule by mouth daily.   PROPRANOLOL (INDERAL) 20 MG TABLET    Take 20 mg by mouth 2 (two) times daily.   SPIRONOLACTONE (ALDACTONE) 25 MG TABLET    Take 25 mg by mouth daily.    VITAMIN D, ERGOCALCIFEROL, (DRISDOL) 50000 UNITS CAPS    Take 50,000 Units by mouth every 30 (thirty) days. On the first of the month  Modified Medications   No medications on file  Discontinued Medications   No medications on file     Physical Exam: Physical Exam  Constitutional: She appears well-developed.  HENT:  Head: Normocephalic and atraumatic.  Left Ear: External ear normal.  Eyes: Conjunctivae and EOM are normal. Pupils are equal, round, and reactive to light.  Neck: Neck supple. No JVD present. No thyromegaly present.  Cardiovascular: Normal rate and regular rhythm.   Murmur heard. Pulmonary/Chest: Effort normal. She has rales.  Bibasilar rales. Central congestion.   Abdominal: Soft. There is no tenderness. There is no rebound.  Musculoskeletal: She exhibits edema.  The right  shoulder/upper arm and hip pain  Lymphadenopathy:    She has no cervical adenopathy.  Neurological: She is alert. No cranial nerve deficit. Coordination normal.  Skin: Skin is warm and dry. Rash noted.  Erythematous and chronic swelling in BLE. Left forefoot diabetic ulcer-larger and deeper in size-wound bed is clean with tunneling(priviously protruding center is flattened and is lower than the skin surface.  Intergluteal cleft skin breakdown with superficial skin missing and erythematous extended to the R+L 1/3 of inner buttocks--new pressure ulcers R+L buttocks near the intergluteal cleft with mirror imaging about a quarter seize stage II presently.  Beefy red buttocks extends to abd thighs, lower back with satellite pattern and excoriation-improving.   Psychiatric: Her mood appears anxious. Her affect is not blunt, not labile and not inappropriate. Her speech is rapid and/or pressured (occasionally ). Her speech is not delayed and not slurred. She is hyperactive. She is not agitated, not aggressive, not slowed, not withdrawn, not actively hallucinating and not combative. Thought content is not paranoid and not delusional. She does not exhibit a depressed mood. She exhibits abnormal recent memory.  MMSE 21/30 08/2013    Filed Vitals:   11/22/13 1659  BP: 164/94  Pulse: 64  Temp: 97.7 F (36.5 C)  TempSrc: Tympanic  Resp: 18      Labs reviewed: Basic Metabolic Panel:  Recent Labs  01/24/13  05/27/13  07/20/13 1921  08/26/13  11/04/13 11/11/13 11/21/13  NA 139  < > 137  < > 136  < >  --   < > 132* 135* 139  K 4.6  < > 4.5  < > 3.9  < >  --   < > 5.3 4.3 4.2  CL  --   --   --   --  97  --   --   --   --   --   --   CO2  --   --   --   --  30  --   --   --   --   --   --  GLUCOSE  --   --   --   --  147*  --   --   --   --   --   --   BUN 41*  < > 44*  < > 46*  < >  --   < > 51* 31* 23*  CREATININE 1.1  < > 1.3*  < > 1.00  < >  --   < > 1.3* 1.0 0.9  CALCIUM  --   --   --   --  9.3   --   --   --   --   --   --   TSH 3.68  --  3.96  --   --   --  2.07  --   --   --   --   < > = values in this interval not displayed. Liver Function Tests:  Recent Labs  07/20/13 1921 08/31/13 10/23/13 11/21/13  AST 19 20 34 17  ALT 12 13 16 9   ALKPHOS 62 82 62 67  BILITOT 0.3  --   --   --   PROT 7.0  --   --   --   ALBUMIN 2.8*  --   --   --    CBC:  Recent Labs  07/20/13 1921  08/31/13 10/23/13 11/21/13  WBC 9.3  < > 8.5 6.9 7.7  NEUTROABS 6.5  --   --   --   --   HGB 13.8  < > 14.9 15.2 13.7  HCT 39.7  < > 44 44 41  MCV 97.1  --   --   --   --   PLT 299  < > 295 245 289  < > = values in this interval not displayed.  Past Procedures:  10/22/13 CXR patchy interstitial changes left lower lung most consistent with pneumonia  11/21/13 CXR minimal cardiomegaly slightly increased without pulmonary vascular congestion or pleural effusion, no inflammatory consolidate or suspicious nodule, interval resolution of interstitial pneumonitis at the left lower lung.    Assessment/Plan Altered mental status Onset 11/21/13--didn't recognize her husband-resolved today, no focal neurological deficits noted, CBC, BMP, CXR, UA unremarkable. Will continue to monitor the patient.   Hyponatremia Resolved.   Acute renal disease Resolved.   HCAP (healthcare-associated pneumonia) Fully treated clinically and radio graphically resolved.   Dementia with behavioral disturbance Her MMSE 21/3010/04/2013. Given her hx of cardiovascular issues--vascular dementia is more likely. Tolerated  Namenda and increased Cymbalta to 30mg  bid              A-fib Rate controlled. Propranolol was increased to 20mg  bid for better rate control in setting of HTN and essential tremor.                               Edema Chronic venous insufficiency and dermatitis. Stable.                                              Unspecified  constipation Frequent loose stools, takes colace 100 x2 qhs and Amitiza 65mcg bid and MiraLax prn.                                   HTN (hypertension)  Controlled after Losartan was decreased to 25mg  daily, monitor blood pressure daily.                                                  Back pain Chronic, pain is controlled with Fentanyl 152mcg/hr and Norco 10/325 q4hr                                          Essential tremor Mild head and hands tremor-not disabling. Takes Propranolol 10mg  bid                                               Depression Better with Cymbalta 30mg  bid and newly added Namenda. Observe the patient.                                         Ischemic heart disease Still had angina occasionally, f/u Cardiology, continue with Imdur 60mg  and Prn NTG(last dose 02/12/13)                    Unspecified hypothyroidism Corrected with Levothyroxine 112.83mcg, last TSH 2.069 08/26/13                                            Type II or unspecified type diabetes mellitus with peripheral circulatory disorders, uncontrolled(250.72) No further hypoglycemic episodes since Lantus16 units. Continue bolus Novolog 8 units bid. Monitor CBGs        Family/ Staff Communication: observe the patient.   Goals of Care: SNF  Labs/tests ordered:  none

## 2013-11-22 NOTE — Assessment & Plan Note (Signed)
Corrected with Levothyroxine 112.5mcg, last TSH 2.069 08/26/13    

## 2013-11-22 NOTE — Assessment & Plan Note (Signed)
Her MMSE 21/3010/04/2013. Given her hx of cardiovascular issues--vascular dementia is more likely. Tolerated  Namenda and increased Cymbalta to 30mg  bid

## 2013-11-22 NOTE — Assessment & Plan Note (Signed)
Resolved

## 2013-11-22 NOTE — Assessment & Plan Note (Signed)
No further hypoglycemic episodes since Lantus16 units. Continue bolus Novolog 8 units bid. Monitor CBGs

## 2013-11-22 NOTE — Assessment & Plan Note (Signed)
Chronic, pain is controlled with Fentanyl 170mcg/hr and Norco 10/325 q4hr

## 2013-11-22 NOTE — Assessment & Plan Note (Signed)
Controlled after Losartan was decreased to 25mg daily, monitor blood pressure daily.                                                   

## 2013-11-22 NOTE — Assessment & Plan Note (Signed)
Rate controlled. Propranolol was increased to 20mg bid for better rate control in setting of HTN and essential tremor.                                

## 2013-11-22 NOTE — Assessment & Plan Note (Signed)
Chronic venous insufficiency and dermatitis. Stable.                                             

## 2013-11-22 NOTE — Assessment & Plan Note (Signed)
Onset 11/21/13--didn't recognize her husband-resolved today, no focal neurological deficits noted, CBC, BMP, CXR, UA unremarkable. Will continue to monitor the patient.

## 2013-11-22 NOTE — Assessment & Plan Note (Signed)
Mild head and hands tremor-not disabling. Takes Propranolol 10mg  bid

## 2013-11-22 NOTE — Assessment & Plan Note (Signed)
Fully treated clinically and radio graphically resolved.

## 2013-11-22 NOTE — Assessment & Plan Note (Signed)
Frequent loose stools, takes colace 100 x2 qhs and Amitiza 24mcg bid and MiraLax prn.                                    

## 2013-11-22 NOTE — Assessment & Plan Note (Signed)
Still had angina occasionally, f/u Cardiology, continue with Imdur 60mg and Prn NTG(last dose 02/12/13)                         

## 2013-11-22 NOTE — Assessment & Plan Note (Signed)
Better with Cymbalta 30mg bid and newly added Namenda. Observe the patient.    

## 2013-11-26 ENCOUNTER — Non-Acute Institutional Stay (SKILLED_NURSING_FACILITY): Payer: Medicare Other | Admitting: Nurse Practitioner

## 2013-11-26 ENCOUNTER — Encounter: Payer: Self-pay | Admitting: Nurse Practitioner

## 2013-11-26 DIAGNOSIS — M7989 Other specified soft tissue disorders: Secondary | ICD-10-CM

## 2013-11-26 DIAGNOSIS — I259 Chronic ischemic heart disease, unspecified: Secondary | ICD-10-CM

## 2013-11-26 DIAGNOSIS — I4891 Unspecified atrial fibrillation: Secondary | ICD-10-CM

## 2013-11-26 DIAGNOSIS — E1159 Type 2 diabetes mellitus with other circulatory complications: Secondary | ICD-10-CM

## 2013-11-26 DIAGNOSIS — F32A Depression, unspecified: Secondary | ICD-10-CM

## 2013-11-26 DIAGNOSIS — F0391 Unspecified dementia with behavioral disturbance: Secondary | ICD-10-CM

## 2013-11-26 DIAGNOSIS — G252 Other specified forms of tremor: Secondary | ICD-10-CM

## 2013-11-26 DIAGNOSIS — F3289 Other specified depressive episodes: Secondary | ICD-10-CM

## 2013-11-26 DIAGNOSIS — J189 Pneumonia, unspecified organism: Secondary | ICD-10-CM

## 2013-11-26 DIAGNOSIS — F03918 Unspecified dementia, unspecified severity, with other behavioral disturbance: Secondary | ICD-10-CM

## 2013-11-26 DIAGNOSIS — M549 Dorsalgia, unspecified: Secondary | ICD-10-CM

## 2013-11-26 DIAGNOSIS — E039 Hypothyroidism, unspecified: Secondary | ICD-10-CM

## 2013-11-26 DIAGNOSIS — R4182 Altered mental status, unspecified: Secondary | ICD-10-CM

## 2013-11-26 DIAGNOSIS — G250 Essential tremor: Secondary | ICD-10-CM

## 2013-11-26 DIAGNOSIS — F329 Major depressive disorder, single episode, unspecified: Secondary | ICD-10-CM

## 2013-11-26 DIAGNOSIS — I10 Essential (primary) hypertension: Secondary | ICD-10-CM

## 2013-11-26 DIAGNOSIS — K5900 Constipation, unspecified: Secondary | ICD-10-CM

## 2013-11-26 NOTE — Assessment & Plan Note (Signed)
Chronic, pain is controlled with Fentanyl 181mcg/hr and will change Norco 10/325 q4hr to prn since the patient stated she has no pain upon my visit. Simplify medications to eliminate mind alternating etiology.

## 2013-11-26 NOTE — Progress Notes (Signed)
Patient ID: [name], female   DOB: [birth year], 80 y.o.   MRN: [id number]   Code Status: DNR  Allergies  Allergen Reactions  . Penicillins Other (See Comments)    Per MAR  . Bee Venom Other (See Comments)    Per MAR  . Celebrex [Celecoxib] Other (See Comments)    Per MAR  . Lidocaine Hcl Other (See Comments)    Per MAR  . Phenobarbital Other (See Comments)    Per MAR  . Procaine Hcl Other (See Comments)    Per mar    Chief Complaint  Patient presents with  . Medical Managment of Chronic Issues    confusion.   . Acute Visit    HPI: Patient is a 80 y.o. female seen in the SNF at [facility] today for evaluation of cognitive decline/confusion and other chronic medical conditions.  Problem List Items Addressed This Visit   Type II or unspecified type diabetes mellitus with peripheral circulatory disorders, uncontrolled(250.72) - Primary (Chronic)     No further hypoglycemic episodes since Lantus16 units. Continue bolus Novolog 8 units bid. Monitor CBGs          Unspecified hypothyroidism (Chronic)     Corrected with Levothyroxine 112.21mcg, last TSH 2.069 08/26/13                                                Ischemic heart disease     Still had angina occasionally, f/u Cardiology, continue with Imdur 60mg  and Prn NTG(last dose 02/12/13)                        Leg swelling     Chronic venous insufficiency and dermatitis. Stable.                                                Depression     Better with Cymbalta 30mg  bid and newly added Namenda. Observe the patient.                                             Essential tremor     Mild head and hands tremor-not disabling. Takes Propranolol 10mg  bid                                                   Back pain     Chronic, pain  is controlled with Fentanyl 195mcg/hr and will change Norco 10/325 q4hr to prn since the patient stated she has no pain upon my visit. Simplify medications to eliminate mind alternating etiology.                                               Relevant Medications      HYDROcodone-acetaminophen (NORCO) 10-325 MG per tablet   HTN (hypertension)  Controlled after Losartan was decreased to 25mg  daily, monitor blood pressure daily.                                                      Unspecified constipation     Frequent loose stools, takes colace 100 x2 qhs and Amitiza 56mcg bid and MiraLax prn.                                       A-fib     Rate controlled. Propranolol was increased to 20mg  bid for better rate control in setting of HTN and essential tremor.                                   Altered mental status     Onset 11/21/13--didn't recognize her husband-resolved. But overall her cognition is declining. Obtain MMSE. No focal neurological deficits.      Dementia with behavioral disturbance     Her MMSE 21/3010/04/2013. Given her hx of cardiovascular issues--vascular dementia is more likely. Tolerated  Namenda and increased Cymbalta to 30mg  bid. Mood is better controlled. Gradual decline in her cognition noted. Repeat MMSE                  HCAP (healthcare-associated pneumonia)     Fully treated and clinically healed.        Review of Systems:  Review of Systems  Constitutional: Negative for fever, chills, weight loss, malaise/fatigue and diaphoresis.  HENT: Positive for hearing loss. Negative for congestion, ear discharge and sore throat.   Eyes: Negative for pain, discharge and redness.  Respiratory: Negative for cough, sputum production, shortness of breath and wheezing.   Cardiovascular: Positive for leg swelling  and PND. Negative for chest pain, palpitations, orthopnea and claudication.  Gastrointestinal: Negative for heartburn, nausea, vomiting, abdominal pain, diarrhea, constipation and blood in stool.       Frequent loose stools.   Genitourinary: Positive for frequency. Negative for dysuria, urgency and flank pain.  Musculoskeletal: Positive for back pain, falls and joint pain. Negative for myalgias and neck pain.       Able to ambulate with walker.   Skin: Positive for rash (chronic BLE venous dermatitis. Left medial forefoot diabetic foot ulcer). Negative for itching.       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 Intergluteal cleft erythema and superficial skin missing-worse-beefy red extends to thighs/abd/lower back-improving.  Left forefoot diabetic ulcer--chronic about a quarter size. Pressure ulcers at R+L buttocks-in mirror image-2x2cm and superficial.   Neurological: Positive for tremors. Negative for dizziness, tingling, sensory change, speech change, focal weakness, seizures, loss of consciousness, weakness and headaches.  Endo/Heme/Allergies: Negative for environmental allergies and polydipsia. Does not bruise/bleed easily.  Psychiatric/Behavioral: Positive for depression and memory loss. Negative for hallucinations. The patient is nervous/anxious and has insomnia.      Past Medical History  Diagnosis Date  . Diabetes mellitus   . Hypertension   . Heart murmur   . Benign familial tremor   . Rosacea   . Diverticulitis   . Heart failure     diastolic heart failure  . TIA (transient ischemic attack) 2010  . Arthritis   .  Breast cancer     breast -rt  . CAD (coronary artery disease)     single vessel CAD per cath in 2006; scattered nonobstructive disease in the left system, left to right collaterals to the RCA which was occluded by flush shots; she has been managed medically   Past Surgical History  Procedure Laterality Date  . Mastectomy partial / lumpectomy  11/02/2000    Right, with  SLN  . Mastectomy partial / lumpectomy  05/15/2008    Left  . Breast lumpectomy  01/19/2010    right  . Mastectomy  03/31/2010    Right   Social History:   reports that she has never smoked. She does not have any smokeless tobacco history on file. She reports that she does not drink alcohol or use illicit drugs.  Medications: Patient's Medications  New Prescriptions   No medications on file  Previous Medications   ACETAMINOPHEN (MAPAP) 325 MG TABLET    Take 650 mg by mouth 2 (two) times daily.   ALOE-SODIUM CHLORIDE (AYR SALINE NASAL GEL NA)    Place 1 application into the nose 2 (two) times daily. Both nostrils   ASPIRIN 81 MG CHEWABLE TABLET    Chew 81 mg by mouth every morning.    DOCUSATE SODIUM (COLACE) 100 MG CAPSULE    Take 100 mg by mouth 2 (two) times daily.    DORZOLAMIDE-TIMOLOL (COSOPT) 22.3-6.8 MG/ML OPHTHALMIC SOLUTION    Place 1 drop into the left eye every 12 (twelve) hours.   DULOXETINE (CYMBALTA) 30 MG CAPSULE    Take 30 mg by mouth 2 (two) times daily.   FENTANYL (DURAGESIC - DOSED MCG/HR) 100 MCG/HR    Apply 1 patch every 3 hours. Change site   FUROSEMIDE (LASIX) 40 MG TABLET    Take 40 mg by mouth 2 (two) times daily.    HYDROXYZINE (ATARAX/VISTARIL) 25 MG TABLET    Take 25 mg by mouth at bedtime as needed. For itching   INSULIN ASPART (NOVOLOG) 100 UNIT/ML INJECTION    Inject 8 Units into the skin 2 (two) times daily. If CBG is greater than 150   INSULIN GLARGINE (LANTUS) 100 UNIT/ML INJECTION    Inject 16 Units into the skin at bedtime.    ISOSORBIDE MONONITRATE (IMDUR) 60 MG 24 HR TABLET    Take 60 mg by mouth every morning.    LEVOTHYROXINE (SYNTHROID, LEVOTHROID) 75 MCG TABLET    Take 75-112.5 mcg by mouth daily. Take 1.5 tablets (112.65mcg) on Saturday. Take 1 tablet (75 mcg) daily all other days of the week   LORATADINE (CLARITIN) 10 MG TABLET    Take 10 mg by mouth daily.    LORAZEPAM (ATIVAN) 0.5 MG TABLET    Take 1 mg by mouth every 12 (twelve) hours as needed.  Severe agitation   LOSARTAN (COZAAR) 50 MG TABLET    Take 50 mg by mouth daily.   LUBIPROSTONE (AMITIZA) 24 MCG CAPSULE    Take 24 mcg by mouth 2 (two) times daily with a meal.     MEMANTINE (NAMENDA) 10 MG TABLET    Take 10 mg by mouth 2 (two) times daily.   MULTIPLE VITAMINS-MINERALS (CERTA-VITE-LUTEIN PO)    Take 1 tablet by mouth daily.    NITROGLYCERIN (NITROSTAT) 0.4 MG SL TABLET    Place 0.4 mg under the tongue every 5 (five) minutes as needed. For chest pain   NUTRITIONAL SUPPLEMENTS (RESOURCE ARGINAID PO)    Take 1 packet by mouth 2 (  two) times daily.   OMEPRAZOLE (PRILOSEC) 20 MG CAPSULE    Take 20 mg by mouth 2 (two) times daily.   OXYMETAZOLINE (AFRIN) 0.05 % NASAL SPRAY    Place 2 sprays into the nose 2 (two) times daily as needed. For congestion   POLYETHYLENE GLYCOL (MIRALAX / GLYCOLAX) PACKET    Take 17 g by mouth daily as needed.    PRAMOXINE-MINERAL OIL-ZINC (TUCKS) 1-12.5 % RECTAL OINTMENT    Place 1 application rectally as needed for hemorrhoids.   PROBIOTIC PRODUCT (ALIGN) 4 MG CAPS    Take 1 capsule by mouth daily.   PROPRANOLOL (INDERAL) 20 MG TABLET    Take 20 mg by mouth 2 (two) times daily.   SPIRONOLACTONE (ALDACTONE) 25 MG TABLET    Take 25 mg by mouth daily.    VITAMIN D, ERGOCALCIFEROL, (DRISDOL) 50000 UNITS CAPS    Take 50,000 Units by mouth every 30 (thirty) days. On the first of the month  Modified Medications   Modified Medication Previous Medication   HYDROCODONE-ACETAMINOPHEN (NORCO) 10-325 MG PER TABLET HYDROcodone-acetaminophen (NORCO) 10-325 MG per tablet      every 4 (four) hours as needed. Take 1/2 tablet by mouth every 4 hours for pain as needed.    Take 1/2 tablet by mouth every 4 hours for pain  Discontinued Medications   No medications on file     Physical Exam: Physical Exam  Constitutional: She appears well-developed.  HENT:  Head: Normocephalic and atraumatic.  Left Ear: External ear normal.  Eyes: Conjunctivae and EOM are normal. Pupils  are equal, round, and reactive to light.  Neck: Neck supple. No JVD present. No thyromegaly present.  Cardiovascular: Normal rate and regular rhythm.   Murmur heard. Pulmonary/Chest: Effort normal. She has rales.  Bibasilar rales.   Abdominal: Soft. There is no tenderness. There is no rebound.  Musculoskeletal: She exhibits edema.  Back pain is chronic which is well controlled.   Lymphadenopathy:    She has no cervical adenopathy.  Neurological: She is alert. No cranial nerve deficit. Coordination normal.  Skin: Skin is warm and dry. Rash noted.  Erythematous and chronic swelling in BLE. Left forefoot diabetic ulcer-larger and deeper in size-wound bed is clean with tunneling(priviously protruding center is flattened and is lower than the skin surface.  Intergluteal cleft skin breakdown with superficial skin missing and erythematous extended to the R+L 1/3 of inner buttocks--new pressure ulcers R+L buttocks near the intergluteal cleft with mirror imaging about a quarter seize stage II presently.  Beefy red buttocks extends to abd thighs, lower back with satellite pattern and excoriation-improving.   Psychiatric: Her mood appears anxious. Her affect is not blunt, not labile and not inappropriate. Her speech is rapid and/or pressured (occasionally ). Her speech is not delayed and not slurred. She is hyperactive. She is not agitated, not aggressive, not slowed, not withdrawn, not actively hallucinating and not combative. Thought content is not paranoid and not delusional. She does not exhibit a depressed mood. She exhibits abnormal recent memory.  MMSE 21/30 08/2013    Filed Vitals:   11/26/13 1720  BP: 126/82  Pulse: 88  Temp: 97.3 F (36.3 C)  TempSrc: Tympanic  Resp: 17      Labs reviewed: Basic Metabolic Panel:  Recent Labs  01/24/13  05/27/13  07/20/13 1921  08/26/13  11/04/13 11/11/13 11/21/13  NA 139  < > 137  < > 136  < >  --   < > 132* 135* 139  K 4.6  < > 4.5  < > 3.9  < >   --   < > 5.3 4.3 4.2  CL  --   --   --   --  97  --   --   --   --   --   --   CO2  --   --   --   --  30  --   --   --   --   --   --   GLUCOSE  --   --   --   --  147*  --   --   --   --   --   --   BUN 41*  < > 44*  < > 46*  < >  --   < > 51* 31* 23*  CREATININE 1.1  < > 1.3*  < > 1.00  < >  --   < > 1.3* 1.0 0.9  CALCIUM  --   --   --   --  9.3  --   --   --   --   --   --   TSH 3.68  --  3.96  --   --   --  2.07  --   --   --   --   < > = values in this interval not displayed. Liver Function Tests:  Recent Labs  07/20/13 1921 08/31/13 10/23/13 11/21/13  AST 19 20 34 17  ALT 12 13 16 9   ALKPHOS 62 82 62 67  BILITOT 0.3  --   --   --   PROT 7.0  --   --   --   ALBUMIN 2.8*  --   --   --    CBC:  Recent Labs  07/20/13 1921  08/31/13 10/23/13 11/21/13  WBC 9.3  < > 8.5 6.9 7.7  NEUTROABS 6.5  --   --   --   --   HGB 13.8  < > 14.9 15.2 13.7  HCT 39.7  < > 44 44 41  MCV 97.1  --   --   --   --   PLT 299  < > 295 245 289  < > = values in this interval not displayed.  Past Procedures:  10/22/13 CXR patchy interstitial changes left lower lung most consistent with pneumonia  11/21/13 CXR minimal cardiomegaly slightly increased without pulmonary vascular congestion or pleural effusion, no inflammatory consolidate or suspicious nodule, interval resolution of interstitial pneumonitis at the left lower lung.    Assessment/Plan Type II or unspecified type diabetes mellitus with peripheral circulatory disorders, uncontrolled(250.72) No further hypoglycemic episodes since Lantus16 units. Continue bolus Novolog 8 units bid. Monitor CBGs        Unspecified hypothyroidism Corrected with Levothyroxine 112.66mcg, last TSH 2.069 08/26/13                                              Ischemic heart disease Still had angina occasionally, f/u Cardiology, continue with Imdur 60mg  and Prn NTG(last dose  02/12/13)                      Leg swelling Chronic venous insufficiency and dermatitis. Stable.  Depression Better with Cymbalta 30mg  bid and newly added Namenda. Observe the patient.                                           Essential tremor Mild head and hands tremor-not disabling. Takes Propranolol 10mg  bid                                                 Back pain Chronic, pain is controlled with Fentanyl 168mcg/hr and will change Norco 10/325 q4hr to prn since the patient stated she has no pain upon my visit. Simplify medications to eliminate mind alternating etiology.                                             HTN (hypertension) Controlled after Losartan was decreased to 25mg  daily, monitor blood pressure daily.                                                    Unspecified constipation Frequent loose stools, takes colace 100 x2 qhs and Amitiza 71mcg bid and MiraLax prn.                                     A-fib Rate controlled. Propranolol was increased to 20mg  bid for better rate control in setting of HTN and essential tremor.                                 Altered mental status Onset 11/21/13--didn't recognize her husband-resolved. But overall her cognition is declining. Obtain MMSE. No focal neurological deficits.    Dementia with behavioral disturbance Her MMSE 21/3010/04/2013. Given her hx of cardiovascular issues--vascular dementia is more likely. Tolerated  Namenda and increased Cymbalta to 30mg  bid. Mood is better controlled. Gradual decline in her cognition noted. Repeat MMSE                HCAP (healthcare-associated  pneumonia) Fully treated and clinically healed.     Family/ Staff Communication: observe the patient.   Goals of Care: SNF  Labs/tests ordered:  none

## 2013-11-26 NOTE — Assessment & Plan Note (Signed)
Fully treated and clinically healed.  

## 2013-11-26 NOTE — Assessment & Plan Note (Signed)
Chronic venous insufficiency and dermatitis. Stable.

## 2013-11-26 NOTE — Assessment & Plan Note (Signed)
No further hypoglycemic episodes since Lantus16 units. Continue bolus Novolog 8 units bid. Monitor CBGs     

## 2013-11-26 NOTE — Assessment & Plan Note (Signed)
Her MMSE 21/3010/04/2013. Given her hx of cardiovascular issues--vascular dementia is more likely. Tolerated  Namenda and increased Cymbalta to 30mg  bid. Mood is better controlled. Gradual decline in her cognition noted. Repeat MMSE

## 2013-11-26 NOTE — Assessment & Plan Note (Signed)
Still had angina occasionally, f/u Cardiology, continue with Imdur 60mg and Prn NTG(last dose 02/12/13)                         

## 2013-11-26 NOTE — Assessment & Plan Note (Signed)
Mild head and hands tremor-not disabling. Takes Propranolol 10mg bid                                              

## 2013-11-26 NOTE — Assessment & Plan Note (Signed)
Frequent loose stools, takes colace 100 x2 qhs and Amitiza 82mcg bid and MiraLax prn.

## 2013-11-26 NOTE — Assessment & Plan Note (Signed)
Onset 11/21/13--didn't recognize her husband-resolved. But overall her cognition is declining. Obtain MMSE. No focal neurological deficits.

## 2013-11-26 NOTE — Assessment & Plan Note (Signed)
Rate controlled. Propranolol was increased to 20mg  bid for better rate control in setting of HTN and essential tremor.

## 2013-11-26 NOTE — Assessment & Plan Note (Signed)
Corrected with Levothyroxine 112.58mcg, last TSH 2.069 08/26/13

## 2013-11-26 NOTE — Assessment & Plan Note (Signed)
Controlled after Losartan was decreased to 25mg  daily, monitor blood pressure daily.

## 2013-11-26 NOTE — Assessment & Plan Note (Signed)
Better with Cymbalta 30mg  bid and newly added Namenda. Observe the patient.

## 2013-12-05 LAB — HEMOGLOBIN A1C: HEMOGLOBIN A1C: 7.5 % — AB (ref 4.0–6.0)

## 2013-12-10 ENCOUNTER — Encounter: Payer: Self-pay | Admitting: Nurse Practitioner

## 2013-12-10 ENCOUNTER — Non-Acute Institutional Stay (SKILLED_NURSING_FACILITY): Payer: Medicare Other | Admitting: Nurse Practitioner

## 2013-12-10 DIAGNOSIS — I259 Chronic ischemic heart disease, unspecified: Secondary | ICD-10-CM

## 2013-12-10 DIAGNOSIS — M549 Dorsalgia, unspecified: Secondary | ICD-10-CM

## 2013-12-10 DIAGNOSIS — F03918 Unspecified dementia, unspecified severity, with other behavioral disturbance: Secondary | ICD-10-CM

## 2013-12-10 DIAGNOSIS — I872 Venous insufficiency (chronic) (peripheral): Secondary | ICD-10-CM

## 2013-12-10 DIAGNOSIS — E039 Hypothyroidism, unspecified: Secondary | ICD-10-CM

## 2013-12-10 DIAGNOSIS — F32A Depression, unspecified: Secondary | ICD-10-CM

## 2013-12-10 DIAGNOSIS — I10 Essential (primary) hypertension: Secondary | ICD-10-CM

## 2013-12-10 DIAGNOSIS — I8310 Varicose veins of unspecified lower extremity with inflammation: Secondary | ICD-10-CM

## 2013-12-10 DIAGNOSIS — K5900 Constipation, unspecified: Secondary | ICD-10-CM

## 2013-12-10 DIAGNOSIS — E871 Hypo-osmolality and hyponatremia: Secondary | ICD-10-CM

## 2013-12-10 DIAGNOSIS — R4182 Altered mental status, unspecified: Secondary | ICD-10-CM

## 2013-12-10 DIAGNOSIS — N289 Disorder of kidney and ureter, unspecified: Secondary | ICD-10-CM

## 2013-12-10 DIAGNOSIS — Z9181 History of falling: Secondary | ICD-10-CM

## 2013-12-10 DIAGNOSIS — R296 Repeated falls: Secondary | ICD-10-CM

## 2013-12-10 DIAGNOSIS — F3289 Other specified depressive episodes: Secondary | ICD-10-CM

## 2013-12-10 DIAGNOSIS — R609 Edema, unspecified: Secondary | ICD-10-CM

## 2013-12-10 DIAGNOSIS — F0391 Unspecified dementia with behavioral disturbance: Secondary | ICD-10-CM

## 2013-12-10 DIAGNOSIS — F329 Major depressive disorder, single episode, unspecified: Secondary | ICD-10-CM

## 2013-12-10 DIAGNOSIS — G250 Essential tremor: Secondary | ICD-10-CM

## 2013-12-10 DIAGNOSIS — I4891 Unspecified atrial fibrillation: Secondary | ICD-10-CM

## 2013-12-10 DIAGNOSIS — G252 Other specified forms of tremor: Secondary | ICD-10-CM

## 2013-12-10 DIAGNOSIS — E1159 Type 2 diabetes mellitus with other circulatory complications: Secondary | ICD-10-CM

## 2013-12-10 NOTE — Assessment & Plan Note (Signed)
Resolved, Na 139 11/21/13

## 2013-12-10 NOTE — Assessment & Plan Note (Signed)
No further hypoglycemic episodes since Lantus16 units. Continue bolus Novolog 8 units bid. Better controlled of her blood sugar: Hgb A1c 8.2 08/28/13 down to 7.5 12/05/13             

## 2013-12-10 NOTE — Progress Notes (Signed)
Patient ID: [name], female   DOB: [birth year], 80 y.o.   MRN: [id number]   Code Status: DNR  Allergies  Allergen Reactions  . Penicillins Other (See Comments)    Per MAR  . Bee Venom Other (See Comments)    Per MAR  . Celebrex [Celecoxib] Other (See Comments)    Per MAR  . Lidocaine Hcl Other (See Comments)    Per MAR  . Phenobarbital Other (See Comments)    Per MAR  . Procaine Hcl Other (See Comments)    Per mar    Chief Complaint  Patient presents with  . Medical Managment of Chronic Issues    flare up BLE venous dermatitis.   . Acute Visit    HPI: Patient is a 80 y.o. female seen in the SNF at [facility] today for evaluation of worsened BLE venous dermatitis and other chronic medical conditions.  Problem List Items Addressed This Visit   A-fib - Primary     Rate controlled. Propranolol was increased to 20mg  bid for better rate control in setting of HTN and essential tremor.                                     Acute renal disease     Resolved. Bun/creat 23/0.81 11/21/13      Altered mental status     Onset 11/21/13--didn't recognize her husband-resolved today, no focal neurological deficits noted, CBC, BMP, CXR, UA unremarkable. The patient is more alert since she is off scheduled Norco. Her mentation is at her baseline episodic confusion and disorientation.       Back pain     Chronic, pain is controlled with Fentanyl 136mcg/hr and Norco 10/325 q4hr prn                                                 Dementia with behavioral disturbance     Her MMSE 21/3010/04/2013. Given her hx of cardiovascular issues--vascular dementia is more likely. Tolerated  Namenda and increased Cymbalta to 30mg  bid. Mood is better controlled. Gradual decline in her cognition noted.                    Depression     Better with Cymbalta 30mg  bid and newly added Namenda. Observe the patient.                                                Edema     Chronic venous insufficiency and dermatitis. Stable.                                                  Essential tremor     Mild head and hands tremor-not disabling. Takes Propranolol 10mg  bid  Falls frequently     Last fall was 12/10/13 1:45 am: while in her husband's room, walking in room without using walker, fell right on buttocks-no visible injuries, denied hitting head.     HTN (hypertension)     Controlled after Losartan was decreased to 25mg  daily, monitor blood pressure daily.                                                        Hyponatremia     Resolved, Na 139 11/21/13    Ischemic heart disease     Still had angina occasionally, f/u Cardiology, continue with Imdur 60mg  and Prn NTG(last dose 02/12/13)                          Type II or unspecified type diabetes mellitus with peripheral circulatory disorders, uncontrolled(250.72) (Chronic)     No further hypoglycemic episodes since Lantus16 units. Continue bolus Novolog 8 units bid. Better controlled of her blood sugar: Hgb A1c 8.2 08/28/13 down to 7.5 12/05/13            Unspecified constipation     Frequent loose stools, takes colace 100 x2 qhs and Amitiza 21mcg bid and MiraLax prn.                                       Unspecified hypothyroidism (Chronic)     Corrected with Levothyroxine 112.21mcg, last TSH 2.069 08/26/13                                                  Venous stasis dermatitis of both lower extremities     From buttocks extend to BLE ankles: open areas buttocks, BLE, left forefoot plantar diabetic foot ulcer: will  apply Sulfacetamide topical lotion to the open areas bid and Hydrocortisone 1% cream bid to the rest reddened areas       Review of Systems:  Review of Systems  Constitutional: Negative for fever, chills, weight loss, malaise/fatigue and diaphoresis.  HENT: Positive for hearing loss. Negative for congestion, ear discharge and sore throat.   Eyes: Negative for pain, discharge and redness.  Respiratory: Negative for cough, sputum production, shortness of breath and wheezing.   Cardiovascular: Positive for leg swelling and PND. Negative for chest pain, palpitations, orthopnea and claudication.  Gastrointestinal: Negative for heartburn, nausea, vomiting, abdominal pain, diarrhea, constipation and blood in stool.       Frequent loose stools.   Genitourinary: Positive for frequency. Negative for dysuria, urgency and flank pain.  Musculoskeletal: Positive for back pain, falls and joint pain. Negative for myalgias and neck pain.       Able to ambulate with walker.   Skin: Positive for rash (chronic BLE venous dermatitis. Left medial forefoot diabetic foot ulcer). Negative for itching.       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 Intergluteal cleft erythema and superficial skin missing-worse-beefy red extends to thighs/abd/lower back-relapsed.  Left forefoot diabetic ulcer--chronic about a quarter size. Pressure ulcers at R+L buttocks-in mirror image-2x2cm and superficial.   Neurological: Positive for tremors.  Negative for dizziness, tingling, sensory change, speech change, focal weakness, seizures, loss of consciousness, weakness and headaches.  Endo/Heme/Allergies: Negative for environmental allergies and polydipsia. Does not bruise/bleed easily.  Psychiatric/Behavioral: Positive for depression and memory loss. Negative for hallucinations. The patient is nervous/anxious and has insomnia.      Past Medical History  Diagnosis Date  . Diabetes mellitus   . Hypertension   . Heart murmur   . Benign  familial tremor   . Rosacea   . Diverticulitis   . Heart failure     diastolic heart failure  . TIA (transient ischemic attack) 2010  . Arthritis   . Breast cancer     breast -rt  . CAD (coronary artery disease)     single vessel CAD per cath in 2006; scattered nonobstructive disease in the left system, left to right collaterals to the RCA which was occluded by flush shots; she has been managed medically   Past Surgical History  Procedure Laterality Date  . Mastectomy partial / lumpectomy  11/02/2000    Right, with SLN  . Mastectomy partial / lumpectomy  05/15/2008    Left  . Breast lumpectomy  01/19/2010    right  . Mastectomy  03/31/2010    Right   Social History:   reports that she has never smoked. She does not have any smokeless tobacco history on file. She reports that she does not drink alcohol or use illicit drugs.  Medications: Patient's Medications  New Prescriptions   No medications on file  Previous Medications   ACETAMINOPHEN (MAPAP) 325 MG TABLET    Take 650 mg by mouth 2 (two) times daily.   ALOE-SODIUM CHLORIDE (AYR SALINE NASAL GEL NA)    Place 1 application into the nose 2 (two) times daily. Both nostrils   ASPIRIN 81 MG CHEWABLE TABLET    Chew 81 mg by mouth every morning.    DOCUSATE SODIUM (COLACE) 100 MG CAPSULE    Take 100 mg by mouth 2 (two) times daily.    DORZOLAMIDE-TIMOLOL (COSOPT) 22.3-6.8 MG/ML OPHTHALMIC SOLUTION    Place 1 drop into the left eye every 12 (twelve) hours.   DULOXETINE (CYMBALTA) 30 MG CAPSULE    Take 30 mg by mouth 2 (two) times daily.   FENTANYL (DURAGESIC - DOSED MCG/HR) 100 MCG/HR    Apply 1 patch every 3 hours. Change site   FUROSEMIDE (LASIX) 40 MG TABLET    Take 40 mg by mouth 2 (two) times daily.    HYDROCODONE-ACETAMINOPHEN (NORCO) 10-325 MG PER TABLET    every 4 (four) hours as needed. Take 1/2 tablet by mouth every 4 hours for pain as needed.   HYDROXYZINE (ATARAX/VISTARIL) 25 MG TABLET    Take 25 mg by mouth at bedtime as  needed. For itching   INSULIN ASPART (NOVOLOG) 100 UNIT/ML INJECTION    Inject 8 Units into the skin 2 (two) times daily. If CBG is greater than 150   INSULIN GLARGINE (LANTUS) 100 UNIT/ML INJECTION    Inject 16 Units into the skin at bedtime.    ISOSORBIDE MONONITRATE (IMDUR) 60 MG 24 HR TABLET    Take 60 mg by mouth every morning.    LEVOTHYROXINE (SYNTHROID, LEVOTHROID) 75 MCG TABLET    Take 75-112.5 mcg by mouth daily. Take 1.5 tablets (112.58mcg) on Saturday. Take 1 tablet (75 mcg) daily all other days of the week   LORATADINE (CLARITIN) 10 MG TABLET    Take 10 mg by mouth daily.  LORAZEPAM (ATIVAN) 0.5 MG TABLET    Take 1 mg by mouth every 12 (twelve) hours as needed. Severe agitation   LOSARTAN (COZAAR) 50 MG TABLET    Take 50 mg by mouth daily.   LUBIPROSTONE (AMITIZA) 24 MCG CAPSULE    Take 24 mcg by mouth 2 (two) times daily with a meal.     MEMANTINE (NAMENDA) 10 MG TABLET    Take 10 mg by mouth 2 (two) times daily.   MULTIPLE VITAMINS-MINERALS (CERTA-VITE-LUTEIN PO)    Take 1 tablet by mouth daily.    NITROGLYCERIN (NITROSTAT) 0.4 MG SL TABLET    Place 0.4 mg under the tongue every 5 (five) minutes as needed. For chest pain   NUTRITIONAL SUPPLEMENTS (RESOURCE ARGINAID PO)    Take 1 packet by mouth 2 (two) times daily.   OMEPRAZOLE (PRILOSEC) 20 MG CAPSULE    Take 20 mg by mouth 2 (two) times daily.   OXYMETAZOLINE (AFRIN) 0.05 % NASAL SPRAY    Place 2 sprays into the nose 2 (two) times daily as needed. For congestion   POLYETHYLENE GLYCOL (MIRALAX / GLYCOLAX) PACKET    Take 17 g by mouth daily as needed.    PRAMOXINE-MINERAL OIL-ZINC (TUCKS) 1-12.5 % RECTAL OINTMENT    Place 1 application rectally as needed for hemorrhoids.   PROBIOTIC PRODUCT (ALIGN) 4 MG CAPS    Take 1 capsule by mouth daily.   PROPRANOLOL (INDERAL) 20 MG TABLET    Take 20 mg by mouth 2 (two) times daily.   SPIRONOLACTONE (ALDACTONE) 25 MG TABLET    Take 25 mg by mouth daily.    VITAMIN D, ERGOCALCIFEROL, (DRISDOL)  50000 UNITS CAPS    Take 50,000 Units by mouth every 30 (thirty) days. On the first of the month  Modified Medications   No medications on file  Discontinued Medications   No medications on file     Physical Exam: Physical Exam  Constitutional: She appears well-developed.  HENT:  Head: Normocephalic and atraumatic.  Left Ear: External ear normal.  Eyes: Conjunctivae and EOM are normal. Pupils are equal, round, and reactive to light.  Neck: Neck supple. No JVD present. No thyromegaly present.  Cardiovascular: Normal rate and regular rhythm.   Murmur heard. Pulmonary/Chest: Effort normal. She has rales.  Bibasilar rales.   Abdominal: Soft. There is no tenderness. There is no rebound.  Musculoskeletal: She exhibits edema.  Back pain is chronic which is well controlled.   Lymphadenopathy:    She has no cervical adenopathy.  Neurological: She is alert. No cranial nerve deficit. Coordination normal.  Skin: Skin is warm and dry. Rash noted.  Erythematous and chronic swelling in BLE. Left forefoot diabetic ulcer-larger and deeper in size-wound bed is clean with tunneling(priviously protruding center is flattened and is lower than the skin surface.  Intergluteal cleft skin breakdown with superficial skin missing and erythematous extended to the R+L 1/3 of inner buttocks--new pressure ulcers R+L buttocks near the intergluteal cleft with mirror imaging about a quarter seize stage II presently.  Beefy red buttocks extends to abd thighs, lower back with satellite pattern and excoriation-improving.   Psychiatric: Her mood appears anxious. Her affect is not blunt, not labile and not inappropriate. Her speech is rapid and/or pressured (occasionally ). Her speech is not delayed and not slurred. She is hyperactive. She is not agitated, not aggressive, not slowed, not withdrawn, not actively hallucinating and not combative. Thought content is not paranoid and not delusional. She does not exhibit a  depressed  mood. She exhibits abnormal recent memory.  MMSE 21/30 08/2013    Filed Vitals:   12/10/13 1118  BP: 131/91  Pulse: 100  Temp: 97.6 F (36.4 C)  TempSrc: Tympanic  Resp: 20      Labs reviewed: Basic Metabolic Panel:  Recent Labs  01/24/13  05/27/13  07/20/13 1921  08/26/13  11/04/13 11/11/13 11/21/13  NA 139  < > 137  < > 136  < >  --   < > 132* 135* 139  K 4.6  < > 4.5  < > 3.9  < >  --   < > 5.3 4.3 4.2  CL  --   --   --   --  97  --   --   --   --   --   --   CO2  --   --   --   --  30  --   --   --   --   --   --   GLUCOSE  --   --   --   --  147*  --   --   --   --   --   --   BUN 41*  < > 44*  < > 46*  < >  --   < > 51* 31* 23*  CREATININE 1.1  < > 1.3*  < > 1.00  < >  --   < > 1.3* 1.0 0.9  CALCIUM  --   --   --   --  9.3  --   --   --   --   --   --   TSH 3.68  --  3.96  --   --   --  2.07  --   --   --   --   < > = values in this interval not displayed. Liver Function Tests:  Recent Labs  07/20/13 1921 08/31/13 10/23/13 11/21/13  AST 19 20 34 17  ALT 12 13 16 9   ALKPHOS 62 82 62 67  BILITOT 0.3  --   --   --   PROT 7.0  --   --   --   ALBUMIN 2.8*  --   --   --    CBC:  Recent Labs  07/20/13 1921  08/31/13 10/23/13 11/21/13  WBC 9.3  < > 8.5 6.9 7.7  NEUTROABS 6.5  --   --   --   --   HGB 13.8  < > 14.9 15.2 13.7  HCT 39.7  < > 44 44 41  MCV 97.1  --   --   --   --   PLT 299  < > 295 245 289  < > = values in this interval not displayed.  Past Procedures:  10/22/13 CXR patchy interstitial changes left lower lung most consistent with pneumonia  11/21/13 CXR minimal cardiomegaly slightly increased without pulmonary vascular congestion or pleural effusion, no inflammatory consolidate or suspicious nodule, interval resolution of interstitial pneumonitis at the left lower lung.    Assessment/Plan A-fib Rate controlled. Propranolol was increased to 20mg  bid for better rate control in setting of HTN and essential tremor.                                    Acute renal disease Resolved. Bun/creat 23/0.81 11/21/13    Altered mental  status Onset 11/21/13--didn't recognize her husband-resolved today, no focal neurological deficits noted, CBC, BMP, CXR, UA unremarkable. The patient is more alert since she is off scheduled Norco. Her mentation is at her baseline episodic confusion and disorientation.     Back pain Chronic, pain is controlled with Fentanyl 149mcg/hr and Norco 10/325 q4hr prn                                               Dementia with behavioral disturbance Her MMSE 21/3010/04/2013. Given her hx of cardiovascular issues--vascular dementia is more likely. Tolerated  Namenda and increased Cymbalta to 30mg  bid. Mood is better controlled. Gradual decline in her cognition noted.                  Depression Better with Cymbalta 30mg  bid and newly added Namenda. Observe the patient.                                             Edema Chronic venous insufficiency and dermatitis. Stable.                                                Essential tremor Mild head and hands tremor-not disabling. Takes Propranolol 10mg  bid                                                   Falls frequently Last fall was 12/10/13 1:45 am: while in her husband's room, walking in room without using walker, fell right on buttocks-no visible injuries, denied hitting head.   HTN (hypertension) Controlled after Losartan was decreased to 25mg  daily, monitor blood pressure daily.                                                      Hyponatremia Resolved, Na 139 11/21/13  Ischemic heart disease Still had angina occasionally, f/u Cardiology, continue with Imdur 60mg   and Prn NTG(last dose 02/12/13)                        Type II or unspecified type diabetes mellitus with peripheral circulatory disorders, uncontrolled(250.72) No further hypoglycemic episodes since Lantus16 units. Continue bolus Novolog 8 units bid. Better controlled of her blood sugar: Hgb A1c 8.2 08/28/13 down to 7.5 12/05/13          Unspecified constipation Frequent loose stools, takes colace 100 x2 qhs and Amitiza 13mcg bid and MiraLax prn.                                     Unspecified hypothyroidism Corrected with Levothyroxine 112.71mcg, last TSH 2.069 08/26/13  Venous stasis dermatitis of both lower extremities From buttocks extend to BLE ankles: open areas buttocks, BLE, left forefoot plantar diabetic foot ulcer: will apply Sulfacetamide topical lotion to the open areas bid and Hydrocortisone 1% cream bid to the rest reddened areas    Family/ Staff Communication: observe the patient.   Goals of Care: SNF  Labs/tests ordered:  none

## 2013-12-10 NOTE — Assessment & Plan Note (Signed)
Frequent loose stools, takes colace 100 x2 qhs and Amitiza 24mcg bid and MiraLax prn.                                    

## 2013-12-10 NOTE — Assessment & Plan Note (Signed)
Mild head and hands tremor-not disabling. Takes Propranolol 10mg bid                                              

## 2013-12-10 NOTE — Assessment & Plan Note (Signed)
Her MMSE 21/3010/04/2013. Given her hx of cardiovascular issues--vascular dementia is more likely. Tolerated  Namenda and increased Cymbalta to 30mg bid. Mood is better controlled. Gradual decline in her cognition noted.   

## 2013-12-10 NOTE — Assessment & Plan Note (Signed)
Chronic, pain is controlled with Fentanyl 147mcg/hr and Norco 10/325 q4hr prn

## 2013-12-10 NOTE — Assessment & Plan Note (Signed)
Resolved. Bun/creat 23/0.81 11/21/13

## 2013-12-10 NOTE — Assessment & Plan Note (Signed)
Rate controlled. Propranolol was increased to 20mg bid for better rate control in setting of HTN and essential tremor.                                

## 2013-12-10 NOTE — Assessment & Plan Note (Signed)
Corrected with Levothyroxine 112.5mcg, last TSH 2.069 08/26/13    

## 2013-12-10 NOTE — Assessment & Plan Note (Signed)
Controlled after Losartan was decreased to 25mg daily, monitor blood pressure daily.                                                   

## 2013-12-10 NOTE — Assessment & Plan Note (Signed)
From buttocks extend to BLE ankles: open areas buttocks, BLE, left forefoot plantar diabetic foot ulcer: will apply Sulfacetamide topical lotion to the open areas bid and Hydrocortisone 1% cream bid to the rest reddened areas

## 2013-12-10 NOTE — Assessment & Plan Note (Signed)
Chronic venous insufficiency and dermatitis. Stable.                                             

## 2013-12-10 NOTE — Assessment & Plan Note (Signed)
Last fall was 12/10/13 1:45 am: while in her husband's room, walking in room without using walker, fell right on buttocks-no visible injuries, denied hitting head.

## 2013-12-10 NOTE — Assessment & Plan Note (Signed)
Onset 11/21/13--didn't recognize her husband-resolved today, no focal neurological deficits noted, CBC, BMP, CXR, UA unremarkable. The patient is more alert since she is off scheduled Norco. Her mentation is at her baseline episodic confusion and disorientation.

## 2013-12-10 NOTE — Assessment & Plan Note (Signed)
Better with Cymbalta 30mg bid and newly added Namenda. Observe the patient.    

## 2013-12-10 NOTE — Assessment & Plan Note (Signed)
Still had angina occasionally, f/u Cardiology, continue with Imdur 60mg and Prn NTG(last dose 02/12/13)                         

## 2013-12-20 ENCOUNTER — Non-Acute Institutional Stay (SKILLED_NURSING_FACILITY): Payer: Medicare Other | Admitting: Nurse Practitioner

## 2013-12-20 DIAGNOSIS — N289 Disorder of kidney and ureter, unspecified: Secondary | ICD-10-CM

## 2013-12-20 DIAGNOSIS — E1159 Type 2 diabetes mellitus with other circulatory complications: Secondary | ICD-10-CM

## 2013-12-20 DIAGNOSIS — R609 Edema, unspecified: Secondary | ICD-10-CM

## 2013-12-20 DIAGNOSIS — G250 Essential tremor: Secondary | ICD-10-CM

## 2013-12-20 DIAGNOSIS — F3289 Other specified depressive episodes: Secondary | ICD-10-CM

## 2013-12-20 DIAGNOSIS — I10 Essential (primary) hypertension: Secondary | ICD-10-CM

## 2013-12-20 DIAGNOSIS — M549 Dorsalgia, unspecified: Secondary | ICD-10-CM

## 2013-12-20 DIAGNOSIS — I4891 Unspecified atrial fibrillation: Secondary | ICD-10-CM

## 2013-12-20 DIAGNOSIS — F03918 Unspecified dementia, unspecified severity, with other behavioral disturbance: Secondary | ICD-10-CM

## 2013-12-20 DIAGNOSIS — F32A Depression, unspecified: Secondary | ICD-10-CM

## 2013-12-20 DIAGNOSIS — K5900 Constipation, unspecified: Secondary | ICD-10-CM

## 2013-12-20 DIAGNOSIS — G252 Other specified forms of tremor: Secondary | ICD-10-CM

## 2013-12-20 DIAGNOSIS — F0391 Unspecified dementia with behavioral disturbance: Secondary | ICD-10-CM

## 2013-12-20 DIAGNOSIS — F329 Major depressive disorder, single episode, unspecified: Secondary | ICD-10-CM

## 2013-12-20 DIAGNOSIS — E039 Hypothyroidism, unspecified: Secondary | ICD-10-CM

## 2013-12-21 ENCOUNTER — Encounter: Payer: Self-pay | Admitting: Nurse Practitioner

## 2013-12-21 NOTE — Assessment & Plan Note (Signed)
Better with Cymbalta 30mg bid and newly added Namenda. Observe the patient.    

## 2013-12-21 NOTE — Assessment & Plan Note (Signed)
Rate controlled. Propranolol was increased to 20mg bid for better rate control in setting of HTN and essential tremor.                                

## 2013-12-21 NOTE — Assessment & Plan Note (Signed)
Corrected with Levothyroxine 112.5mcg, last TSH 2.069 08/26/13    

## 2013-12-21 NOTE — Assessment & Plan Note (Addendum)
Chronic venous insufficiency and dermatitis. Worse. Increase Furosemide from 40mg  bid to 80mg  in am and 40mg  in pm for 5 days. Observe the patient.

## 2013-12-21 NOTE — Assessment & Plan Note (Signed)
Stable, takes colace 100 II qhs and Amitiza 24mcg bid and MiraLax prn.   

## 2013-12-21 NOTE — Progress Notes (Signed)
Patient ID: [name], female   DOB: [birth year], 80 y.o.   MRN: [id number]   Code Status: DNR  Allergies  Allergen Reactions  . Penicillins Other (See Comments)    Per MAR  . Bee Venom Other (See Comments)    Per MAR  . Celebrex [Celecoxib] Other (See Comments)    Per MAR  . Lidocaine Hcl Other (See Comments)    Per MAR  . Phenobarbital Other (See Comments)    Per MAR  . Procaine Hcl Other (See Comments)    Per mar    Chief Complaint  Patient presents with  . Medical Managment of Chronic Issues    BLE edema  . Acute Visit    HPI: Patient is a 80 y.o. female seen in the SNF at [facility] today for evaluation of worsened BLE edeam and other chronic medical conditions.  Problem List Items Addressed This Visit   A-fib - Primary     Rate controlled. Propranolol was increased to 20mg  bid for better rate control in setting of HTN and essential tremor.                                       Acute renal disease     Resolved. Bun/creat 23/0.81 11/21/13        Back pain     Chronic, pain is controlled with Fentanyl 145mcg/hr and able to change Norco 10/325 q4hr prn                                                   Dementia with behavioral disturbance     Her MMSE 21/3010/04/2013. Given her hx of cardiovascular issues--vascular dementia is more likely. Tolerated  Namenda and increased Cymbalta to 30mg  bid. Mood is better controlled. Gradual decline in her cognition noted.                      Depression     Better with Cymbalta 30mg  bid and newly added Namenda. Observe the patient.                                                 Edema     Chronic venous insufficiency and dermatitis. Worse. Increase Furosemide from 40mg  bid to 80mg  in am and 40mg  in pm for 5 days. Observe the patient.                                                     Essential tremor     Mild head and hands tremor-not disabling. Takes Propranolol 10mg  bid                                                       HTN (hypertension)     Controlled after Losartan was  decreased to 25mg  daily, monitor blood pressure daily.                                                          Type II or unspecified type diabetes mellitus with peripheral circulatory disorders, uncontrolled(250.72) (Chronic)     No further hypoglycemic episodes since Lantus16 units. Continue bolus Novolog 8 units bid. Better controlled of her blood sugar: Hgb A1c 8.2 08/28/13 down to 7.5 12/05/13              Unspecified constipation     Stable, takes colace 100 II qhs and Amitiza 23mcg bid and MiraLax prn.                                         Unspecified hypothyroidism (Chronic)     Corrected with Levothyroxine 112.9mcg, last TSH 2.069 08/26/13                                                       Review of Systems:  Review of Systems  Constitutional: Negative for fever, chills, weight loss, malaise/fatigue and diaphoresis.  HENT: Positive for hearing loss. Negative for congestion, ear discharge and sore throat.   Eyes: Negative for pain, discharge and redness.  Respiratory: Negative for cough, sputum production, shortness of breath and wheezing.   Cardiovascular: Positive for leg swelling and PND. Negative for chest pain, palpitations, orthopnea and claudication.  Gastrointestinal: Negative for heartburn, nausea, vomiting, abdominal pain, diarrhea, constipation and blood in stool.  Genitourinary: Positive for frequency. Negative for dysuria, urgency and flank pain.  Musculoskeletal: Positive for  back pain, falls and joint pain. Negative for myalgias and neck pain.       Able to ambulate with walker.   Skin: Positive for rash (chronic BLE venous dermatitis. Left medial forefoot diabetic foot ulcer). Negative for itching.       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 Intergluteal cleft erythema and superficial skin missing-worse-beefy red extends to thighs/abd/lower back-relapsed.  Left forefoot diabetic ulcer--chronic about a quarter size. Pressure ulcers at R+L buttocks-in mirror image-2x2cm and superficial.   Neurological: Positive for tremors. Negative for dizziness, tingling, sensory change, speech change, focal weakness, seizures, loss of consciousness, weakness and headaches.  Endo/Heme/Allergies: Negative for environmental allergies and polydipsia. Does not bruise/bleed easily.  Psychiatric/Behavioral: Positive for depression and memory loss. Negative for hallucinations. The patient is nervous/anxious and has insomnia.      Past Medical History  Diagnosis Date  . Diabetes mellitus   . Hypertension   . Heart murmur   . Benign familial tremor   . Rosacea   . Diverticulitis   . Heart failure     diastolic heart failure  . TIA (transient ischemic attack) 2010  . Arthritis   . Breast cancer     breast -rt  . CAD (coronary artery disease)     single vessel CAD per cath in 2006; scattered nonobstructive disease in the left system, left to right collaterals to the RCA which was occluded by flush shots; she has been managed  medically   Past Surgical History  Procedure Laterality Date  . Mastectomy partial / lumpectomy  11/02/2000    Right, with SLN  . Mastectomy partial / lumpectomy  05/15/2008    Left  . Breast lumpectomy  01/19/2010    right  . Mastectomy  03/31/2010    Right   Social History:   reports that she has never smoked. She does not have any smokeless tobacco history on file. She reports that she does not drink alcohol or use illicit drugs.  Medications: Patient's  Medications  New Prescriptions   No medications on file  Previous Medications   ACETAMINOPHEN (MAPAP) 325 MG TABLET    Take 650 mg by mouth 2 (two) times daily.   ALOE-SODIUM CHLORIDE (AYR SALINE NASAL GEL NA)    Place 1 application into the nose 2 (two) times daily. Both nostrils   ASPIRIN 81 MG CHEWABLE TABLET    Chew 81 mg by mouth every morning.    DOCUSATE SODIUM (COLACE) 100 MG CAPSULE    Take 100 mg by mouth 2 (two) times daily.    DORZOLAMIDE-TIMOLOL (COSOPT) 22.3-6.8 MG/ML OPHTHALMIC SOLUTION    Place 1 drop into the left eye every 12 (twelve) hours.   DULOXETINE (CYMBALTA) 30 MG CAPSULE    Take 30 mg by mouth 2 (two) times daily.   FENTANYL (DURAGESIC - DOSED MCG/HR) 100 MCG/HR    Apply 1 patch every 3 hours. Change site   FUROSEMIDE (LASIX) 40 MG TABLET    Take 40 mg by mouth 2 (two) times daily.    HYDROCODONE-ACETAMINOPHEN (NORCO) 10-325 MG PER TABLET    every 4 (four) hours as needed. Take 1/2 tablet by mouth every 4 hours for pain as needed.   HYDROXYZINE (ATARAX/VISTARIL) 25 MG TABLET    Take 25 mg by mouth at bedtime as needed. For itching   INSULIN ASPART (NOVOLOG) 100 UNIT/ML INJECTION    Inject 8 Units into the skin 2 (two) times daily. If CBG is greater than 150   INSULIN GLARGINE (LANTUS) 100 UNIT/ML INJECTION    Inject 16 Units into the skin at bedtime.    ISOSORBIDE MONONITRATE (IMDUR) 60 MG 24 HR TABLET    Take 60 mg by mouth every morning.    LEVOTHYROXINE (SYNTHROID, LEVOTHROID) 75 MCG TABLET    Take 75-112.5 mcg by mouth daily. Take 1.5 tablets (112.42mcg) on Saturday. Take 1 tablet (75 mcg) daily all other days of the week   LORATADINE (CLARITIN) 10 MG TABLET    Take 10 mg by mouth daily.    LORAZEPAM (ATIVAN) 0.5 MG TABLET    Take 1 mg by mouth every 12 (twelve) hours as needed. Severe agitation   LOSARTAN (COZAAR) 50 MG TABLET    Take 50 mg by mouth daily.   LUBIPROSTONE (AMITIZA) 24 MCG CAPSULE    Take 24 mcg by mouth 2 (two) times daily with a meal.     MEMANTINE  (NAMENDA) 10 MG TABLET    Take 10 mg by mouth 2 (two) times daily.   MULTIPLE VITAMINS-MINERALS (CERTA-VITE-LUTEIN PO)    Take 1 tablet by mouth daily.    NITROGLYCERIN (NITROSTAT) 0.4 MG SL TABLET    Place 0.4 mg under the tongue every 5 (five) minutes as needed. For chest pain   NUTRITIONAL SUPPLEMENTS (RESOURCE ARGINAID PO)    Take 1 packet by mouth 2 (two) times daily.   OMEPRAZOLE (PRILOSEC) 20 MG CAPSULE    Take 20 mg by mouth 2 (two)  times daily.   OXYMETAZOLINE (AFRIN) 0.05 % NASAL SPRAY    Place 2 sprays into the nose 2 (two) times daily as needed. For congestion   POLYETHYLENE GLYCOL (MIRALAX / GLYCOLAX) PACKET    Take 17 g by mouth daily as needed.    PRAMOXINE-MINERAL OIL-ZINC (TUCKS) 1-12.5 % RECTAL OINTMENT    Place 1 application rectally as needed for hemorrhoids.   PROBIOTIC PRODUCT (ALIGN) 4 MG CAPS    Take 1 capsule by mouth daily.   PROPRANOLOL (INDERAL) 20 MG TABLET    Take 20 mg by mouth 2 (two) times daily.   SPIRONOLACTONE (ALDACTONE) 25 MG TABLET    Take 25 mg by mouth daily.    VITAMIN D, ERGOCALCIFEROL, (DRISDOL) 50000 UNITS CAPS    Take 50,000 Units by mouth every 30 (thirty) days. On the first of the month  Modified Medications   No medications on file  Discontinued Medications   No medications on file     Physical Exam: Physical Exam  Constitutional: She appears well-developed.  HENT:  Head: Normocephalic and atraumatic.  Left Ear: External ear normal.  Eyes: Conjunctivae and EOM are normal. Pupils are equal, round, and reactive to light.  Neck: Neck supple. No JVD present. No thyromegaly present.  Cardiovascular: Normal rate and regular rhythm.   Murmur heard. Pulmonary/Chest: Effort normal. She has rales.  Bibasilar rales.   Abdominal: Soft. There is no tenderness. There is no rebound.  Musculoskeletal: She exhibits edema.  Back pain is chronic which is well controlled. BLE worsened since Lasix was reduced when her oral intake was poor.    Lymphadenopathy:    She has no cervical adenopathy.  Neurological: She is alert. No cranial nerve deficit. Coordination normal.  Skin: Skin is warm and dry. Rash noted.  Erythematous and chronic swelling in BLE. Left forefoot diabetic ulcer-larger and deeper in size-wound bed is clean with tunneling(priviously protruding center is flattened and is lower than the skin surface.  Intergluteal cleft skin breakdown with superficial skin missing and erythematous extended to the R+L 1/3 of inner buttocks--new pressure ulcers R+L buttocks near the intergluteal cleft with mirror imaging about a quarter seize stage II presently.  Beefy red buttocks extends to abd thighs, lower back with satellite pattern and excoriation-improving.   Psychiatric: Her mood appears anxious. Her affect is not blunt, not labile and not inappropriate. Her speech is rapid and/or pressured (occasionally ). Her speech is not delayed and not slurred. She is hyperactive. She is not agitated, not aggressive, not slowed, not withdrawn, not actively hallucinating and not combative. Thought content is not paranoid and not delusional. She does not exhibit a depressed mood. She exhibits abnormal recent memory.  MMSE 21/30 08/2013    Filed Vitals:   12/20/13 1639  BP: 112/70  Pulse: 68  Temp: 98.2 F (36.8 C)  TempSrc: Tympanic  Resp: 18      Labs reviewed: Basic Metabolic Panel:  Recent Labs  01/24/13  05/27/13  07/20/13 1921  08/26/13  11/04/13 11/11/13 11/21/13  NA 139  < > 137  < > 136  < >  --   < > 132* 135* 139  K 4.6  < > 4.5  < > 3.9  < >  --   < > 5.3 4.3 4.2  CL  --   --   --   --  97  --   --   --   --   --   --   CO2  --   --   --   --  30  --   --   --   --   --   --   GLUCOSE  --   --   --   --  147*  --   --   --   --   --   --   BUN 41*  < > 44*  < > 46*  < >  --   < > 51* 31* 23*  CREATININE 1.1  < > 1.3*  < > 1.00  < >  --   < > 1.3* 1.0 0.9  CALCIUM  --   --   --   --  9.3  --   --   --   --   --   --    TSH 3.68  --  3.96  --   --   --  2.07  --   --   --   --   < > = values in this interval not displayed. Liver Function Tests:  Recent Labs  07/20/13 1921 08/31/13 10/23/13 11/21/13  AST 19 20 34 17  ALT 12 13 16 9   ALKPHOS 62 82 62 67  BILITOT 0.3  --   --   --   PROT 7.0  --   --   --   ALBUMIN 2.8*  --   --   --    CBC:  Recent Labs  07/20/13 1921  08/31/13 10/23/13 11/21/13  WBC 9.3  < > 8.5 6.9 7.7  NEUTROABS 6.5  --   --   --   --   HGB 13.8  < > 14.9 15.2 13.7  HCT 39.7  < > 44 44 41  MCV 97.1  --   --   --   --   PLT 299  < > 295 245 289  < > = values in this interval not displayed.  Past Procedures:  10/22/13 CXR patchy interstitial changes left lower lung most consistent with pneumonia  11/21/13 CXR minimal cardiomegaly slightly increased without pulmonary vascular congestion or pleural effusion, no inflammatory consolidate or suspicious nodule, interval resolution of interstitial pneumonitis at the left lower lung.    Assessment/Plan A-fib Rate controlled. Propranolol was increased to 20mg  bid for better rate control in setting of HTN and essential tremor.                                     Acute renal disease Resolved. Bun/creat 23/0.81 11/21/13      Back pain Chronic, pain is controlled with Fentanyl 135mcg/hr and able to change Norco 10/325 q4hr prn                                                 Dementia with behavioral disturbance Her MMSE 21/3010/04/2013. Given her hx of cardiovascular issues--vascular dementia is more likely. Tolerated  Namenda and increased Cymbalta to 30mg  bid. Mood is better controlled. Gradual decline in her cognition noted.                    Depression Better with Cymbalta 30mg  bid and newly added Namenda. Observe the patient.  Edema Chronic  venous insufficiency and dermatitis. Worse. Increase Furosemide from 40mg  bid to 80mg  in am and 40mg  in pm for 5 days. Observe the patient.                                                  Essential tremor Mild head and hands tremor-not disabling. Takes Propranolol 10mg  bid                                                     HTN (hypertension) Controlled after Losartan was decreased to 25mg  daily, monitor blood pressure daily.                                                        Type II or unspecified type diabetes mellitus with peripheral circulatory disorders, uncontrolled(250.72) No further hypoglycemic episodes since Lantus16 units. Continue bolus Novolog 8 units bid. Better controlled of her blood sugar: Hgb A1c 8.2 08/28/13 down to 7.5 12/05/13            Unspecified constipation Stable, takes colace 100 II qhs and Amitiza 57mcg bid and MiraLax prn.                                       Unspecified hypothyroidism Corrected with Levothyroxine 112.52mcg, last TSH 2.069 08/26/13                                                    Family/ Staff Communication: observe the patient.   Goals of Care: SNF  Labs/tests ordered:  none

## 2013-12-21 NOTE — Assessment & Plan Note (Signed)
Her MMSE 21/3010/04/2013. Given her hx of cardiovascular issues--vascular dementia is more likely. Tolerated  Namenda and increased Cymbalta to 30mg bid. Mood is better controlled. Gradual decline in her cognition noted.   

## 2013-12-21 NOTE — Assessment & Plan Note (Signed)
No further hypoglycemic episodes since Lantus16 units. Continue bolus Novolog 8 units bid. Better controlled of her blood sugar: Hgb A1c 8.2 08/28/13 down to 7.5 12/05/13             

## 2013-12-21 NOTE — Assessment & Plan Note (Signed)
Chronic, pain is controlled with Fentanyl 137mcg/hr and able to change Norco 10/325 q4hr prn

## 2013-12-21 NOTE — Assessment & Plan Note (Signed)
Controlled after Losartan was decreased to 25mg daily, monitor blood pressure daily.                                                   

## 2013-12-21 NOTE — Assessment & Plan Note (Signed)
Mild head and hands tremor-not disabling. Takes Propranolol 10mg bid                                              

## 2013-12-21 NOTE — Assessment & Plan Note (Signed)
Resolved. Bun/creat 23/0.81 11/21/13   

## 2013-12-23 LAB — BASIC METABOLIC PANEL
BUN: 30 mg/dL — AB (ref 4–21)
Creatinine: 1 mg/dL (ref 0.5–1.1)
Glucose: 156 mg/dL
POTASSIUM: 3.5 mmol/L (ref 3.4–5.3)
Sodium: 139 mmol/L (ref 137–147)

## 2013-12-23 LAB — CBC AND DIFFERENTIAL
HCT: 40 % (ref 36–46)
HEMOGLOBIN: 13.3 g/dL (ref 12.0–16.0)
Platelets: 268 10*3/uL (ref 150–399)
WBC: 6.7 10*3/mL

## 2013-12-24 ENCOUNTER — Non-Acute Institutional Stay (SKILLED_NURSING_FACILITY): Payer: Medicare Other | Admitting: Nurse Practitioner

## 2013-12-24 ENCOUNTER — Encounter: Payer: Self-pay | Admitting: Nurse Practitioner

## 2013-12-24 DIAGNOSIS — E039 Hypothyroidism, unspecified: Secondary | ICD-10-CM

## 2013-12-24 DIAGNOSIS — I8310 Varicose veins of unspecified lower extremity with inflammation: Secondary | ICD-10-CM

## 2013-12-24 DIAGNOSIS — I259 Chronic ischemic heart disease, unspecified: Secondary | ICD-10-CM

## 2013-12-24 DIAGNOSIS — S0083XA Contusion of other part of head, initial encounter: Secondary | ICD-10-CM

## 2013-12-24 DIAGNOSIS — K5900 Constipation, unspecified: Secondary | ICD-10-CM

## 2013-12-24 DIAGNOSIS — R296 Repeated falls: Secondary | ICD-10-CM

## 2013-12-24 DIAGNOSIS — F32A Depression, unspecified: Secondary | ICD-10-CM

## 2013-12-24 DIAGNOSIS — I480 Paroxysmal atrial fibrillation: Secondary | ICD-10-CM

## 2013-12-24 DIAGNOSIS — S1093XA Contusion of unspecified part of neck, initial encounter: Secondary | ICD-10-CM

## 2013-12-24 DIAGNOSIS — R609 Edema, unspecified: Secondary | ICD-10-CM

## 2013-12-24 DIAGNOSIS — I4891 Unspecified atrial fibrillation: Secondary | ICD-10-CM

## 2013-12-24 DIAGNOSIS — F0391 Unspecified dementia with behavioral disturbance: Secondary | ICD-10-CM

## 2013-12-24 DIAGNOSIS — M549 Dorsalgia, unspecified: Secondary | ICD-10-CM

## 2013-12-24 DIAGNOSIS — F03918 Unspecified dementia, unspecified severity, with other behavioral disturbance: Secondary | ICD-10-CM

## 2013-12-24 DIAGNOSIS — G250 Essential tremor: Secondary | ICD-10-CM

## 2013-12-24 DIAGNOSIS — S0003XA Contusion of scalp, initial encounter: Secondary | ICD-10-CM

## 2013-12-24 DIAGNOSIS — F3289 Other specified depressive episodes: Secondary | ICD-10-CM

## 2013-12-24 DIAGNOSIS — I872 Venous insufficiency (chronic) (peripheral): Secondary | ICD-10-CM

## 2013-12-24 DIAGNOSIS — Z9181 History of falling: Secondary | ICD-10-CM

## 2013-12-24 DIAGNOSIS — G252 Other specified forms of tremor: Secondary | ICD-10-CM

## 2013-12-24 DIAGNOSIS — F329 Major depressive disorder, single episode, unspecified: Secondary | ICD-10-CM

## 2013-12-24 DIAGNOSIS — I10 Essential (primary) hypertension: Secondary | ICD-10-CM

## 2013-12-24 DIAGNOSIS — E1159 Type 2 diabetes mellitus with other circulatory complications: Secondary | ICD-10-CM

## 2013-12-24 NOTE — Assessment & Plan Note (Signed)
Her MMSE 21/3010/04/2013. Given her hx of cardiovascular issues--vascular dementia is more likely. Tolerated  Namenda and increased Cymbalta to 30mg bid. Mood is better controlled. Gradual decline in her cognition noted.   

## 2013-12-24 NOTE — Assessment & Plan Note (Signed)
Still had angina occasionally, f/u Cardiology, continue with Imdur 60mg  and Prn NTG(last dose 02/12/13)

## 2013-12-24 NOTE — Assessment & Plan Note (Signed)
Chronic venous insufficiency and dermatitis. Worse. Increase Furosemide from 40mg bid to 80mg in am and 40mg in pm for 5 days. Observe the patient.                                                 

## 2013-12-24 NOTE — Assessment & Plan Note (Signed)
Chronic-coccyalgia in nature, pain is controlled with Fentanyl 136mcg/hr and able to change Norco 10/325 q4hr prn

## 2013-12-24 NOTE — Assessment & Plan Note (Signed)
Rate controlled. Propranolol was increased to 20mg bid for better rate control in setting of HTN and essential tremor.                                

## 2013-12-24 NOTE — Assessment & Plan Note (Signed)
Left frontal near hairline, a quarter size, it should healed.

## 2013-12-24 NOTE — Assessment & Plan Note (Signed)
[place] 3:45 am 12/23/13, hit head, sustained left forehead hematoma, no focal neurological deficit, also skin tears to the right knee, UA 12/24/13 50,000c/ml mulitple bacterial morphotypes-denied dysuria, resolved N/V, no CVA or lower back tenderness. CBC and CMP unremarkable. Will continue to monitor the patient and intensive supervision for safety.

## 2013-12-24 NOTE — Assessment & Plan Note (Signed)
Corrected with Levothyroxine 112.5mcg, last TSH 2.069 08/26/13    

## 2013-12-24 NOTE — Assessment & Plan Note (Signed)
Stable, takes colace 100 II qhs and Amitiza 24mcg bid and MiraLax prn.   

## 2013-12-24 NOTE — Assessment & Plan Note (Signed)
Better with Cymbalta 30mg bid and newly added Namenda. Observe the patient.    

## 2013-12-24 NOTE — Assessment & Plan Note (Signed)
No further hypoglycemic episodes since Lantus16 units. Continue bolus Novolog 8 units bid. Better controlled of her blood sugar: Hgb A1c 8.2 08/28/13 down to 7.5 12/05/13

## 2013-12-24 NOTE — Progress Notes (Signed)
Patient ID: [name], female   DOB: Apr 12, 1926, 80 y.o.   MRN: [id number]   Code Status: DNR  Allergies  Allergen Reactions  . Penicillins Other (See Comments)    Per MAR  . Bee Venom Other (See Comments)    Per MAR  . Celebrex [Celecoxib] Other (See Comments)    Per MAR  . Lidocaine Hcl Other (See Comments)    Per MAR  . Phenobarbital Other (See Comments)    Per MAR  . Procaine Hcl Other (See Comments)    Per mar    Chief Complaint  Patient presents with  . Medical Managment of Chronic Issues    s/p fall  . Acute Visit    HPI: Patient is a 80 y.o. female seen in the SNF at [facility] today for evaluation of s/p fall, worsened BLE edeam and other chronic medical conditions.  Problem List Items Addressed This Visit   A-fib     Rate controlled. Propranolol was increased to 20mg  bid for better rate control in setting of HTN and essential tremor.                                         Back pain     Chronic-coccyalgia in nature, pain is controlled with Fentanyl 139mcg/hr and able to change Norco 10/325 q4hr prn                                                     Dementia with behavioral disturbance     Her MMSE 21/3010/04/2013. Given her hx of cardiovascular issues--vascular dementia is more likely. Tolerated  Namenda and increased Cymbalta to 30mg  bid. Mood is better controlled. Gradual decline in her cognition noted.                        Depression     Better with Cymbalta 30mg  bid and newly added Namenda. Observe the patient.                                                   Edema     Chronic venous insufficiency and dermatitis. Worse. Increase Furosemide from 40mg  bid to 80mg  in am and 40mg  in pm for 5 days. Observe the patient.                                                       Essential tremor     Mild head and hands tremor-not disabling. Takes Propranolol 10mg  bid                                                         Falls frequently - Primary     Fell 3:45 am 12/23/13, hit head,  sustained left forehead hematoma, no focal neurological deficit, also skin tears to the right knee, UA 12/24/13 50,000c/ml mulitple bacterial morphotypes-denied dysuria, resolved N/V, no CVA or lower back tenderness. CBC and CMP unremarkable. Will continue to monitor the patient and intensive supervision for safety.     Hematoma of frontal scalp     Left frontal near hairline, a quarter size, it should healed.     HTN (hypertension)     Controlled after Losartan was decreased to 25mg  daily, monitor blood pressure daily.                                                            Ischemic heart disease     Still had angina occasionally, f/u Cardiology, continue with Imdur 60mg  and Prn NTG(last dose 02/12/13)                            PAF (paroxysmal atrial fibrillation)     Rate controlled. Propranolol was increased to 20mg  bid for better rate control in setting of HTN and essential tremor.                                         Type II or unspecified type diabetes mellitus with peripheral circulatory disorders, uncontrolled(250.72) (Chronic)     No further hypoglycemic episodes since Lantus16 units. Continue bolus Novolog 8 units bid. Better controlled of her blood sugar: Hgb A1c 8.2 08/28/13 down to 7.5 12/05/13                Unspecified constipation     Stable, takes colace 100 II qhs and Amitiza 58mcg bid and MiraLax prn.                                            Unspecified hypothyroidism (Chronic)     Corrected with Levothyroxine 112.59mcg, last TSH 2.069 08/26/13                                                      Venous stasis dermatitis of both lower extremities     From buttocks extend to BLE ankles: open areas buttocks, BLE, left forefoot plantar diabetic foot ulcer: continue to apply Sulfacetamide topical lotion to the open areas bid and Hydrocortisone 1% cream bid to the rest reddened areas         Review of Systems:  Review of Systems  Constitutional: Negative for fever, chills, weight loss, malaise/fatigue and diaphoresis.  HENT: Positive for hearing loss. Negative for congestion, ear discharge and sore throat.   Eyes: Negative for pain, discharge and redness.  Respiratory: Negative for cough, sputum production, shortness of breath and wheezing.   Cardiovascular: Positive for leg swelling and PND. Negative for chest pain, palpitations, orthopnea and claudication.  Gastrointestinal: Negative for heartburn, nausea, vomiting, abdominal pain, diarrhea, constipation and blood in stool.  Genitourinary: Positive for frequency. Negative  for dysuria, urgency and flank pain.  Musculoskeletal: Positive for back pain, falls and joint pain. Negative for myalgias and neck pain.       Able to ambulate with walker.   Skin: Positive for rash (chronic BLE venous dermatitis. Left medial forefoot diabetic foot ulcer). Negative for itching.       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 Intergluteal cleft erythema and superficial skin missing-worse-beefy red extends to thighs/abd/lower back-relapsed.  Left forefoot diabetic ulcer--chronic about a quarter size. Pressure ulcers at R+L buttocks-in mirror image-2x2cm and superficial. Left forehead hematoma and left knee bruise.   Neurological: Positive for tremors. Negative for dizziness, tingling, sensory change, speech change, focal weakness, seizures, loss of  consciousness, weakness and headaches.  Endo/Heme/Allergies: Negative for environmental allergies and polydipsia. Does not bruise/bleed easily.  Psychiatric/Behavioral: Positive for depression and memory loss. Negative for hallucinations. The patient is nervous/anxious and has insomnia.      Past Medical History  Diagnosis Date  . Diabetes mellitus   . Hypertension   . Heart murmur   . Benign familial tremor   . Rosacea   . Diverticulitis   . Heart failure     diastolic heart failure  . TIA (transient ischemic attack) 2010  . Arthritis   . Breast cancer     breast -rt  . CAD (coronary artery disease)     single vessel CAD per cath in 2006; scattered nonobstructive disease in the left system, left to right collaterals to the RCA which was occluded by flush shots; she has been managed medically   Past Surgical History  Procedure Laterality Date  . Mastectomy partial / lumpectomy  11/02/2000    Right, with SLN  . Mastectomy partial / lumpectomy  05/15/2008    Left  . Breast lumpectomy  01/19/2010    right  . Mastectomy  03/31/2010    Right   Social History:   reports that she has never smoked. She does not have any smokeless tobacco history on file. She reports that she does not drink alcohol or use illicit drugs.  Medications: Patient's Medications  New Prescriptions   No medications on file  Previous Medications   ACETAMINOPHEN (MAPAP) 325 MG TABLET    Take 650 mg by mouth 2 (two) times daily.   ALOE-SODIUM CHLORIDE (AYR SALINE NASAL GEL NA)    Place 1 application into the nose 2 (two) times daily. Both nostrils   ASPIRIN 81 MG CHEWABLE TABLET    Chew 81 mg by mouth every morning.    DOCUSATE SODIUM (COLACE) 100 MG CAPSULE    Take 100 mg by mouth 2 (two) times daily.    DORZOLAMIDE-TIMOLOL (COSOPT) 22.3-6.8 MG/ML OPHTHALMIC SOLUTION    Place 1 drop into the left eye every 12 (twelve) hours.   DULOXETINE (CYMBALTA) 30 MG CAPSULE    Take 30 mg by mouth 2 (two) times daily.    FENTANYL (DURAGESIC - DOSED MCG/HR) 100 MCG/HR    Apply 1 patch every 3 hours. Change site   FUROSEMIDE (LASIX) 40 MG TABLET    Take 40 mg by mouth 2 (two) times daily.    HYDROCODONE-ACETAMINOPHEN (NORCO) 10-325 MG PER TABLET    every 4 (four) hours as needed. Take 1/2 tablet by mouth every 4 hours for pain as needed.   HYDROXYZINE (ATARAX/VISTARIL) 25 MG TABLET    Take 25 mg by mouth at bedtime as needed. For itching   INSULIN ASPART (NOVOLOG) 100 UNIT/ML INJECTION    Inject 8 Units into  the skin 2 (two) times daily. If CBG is greater than 150   INSULIN GLARGINE (LANTUS) 100 UNIT/ML INJECTION    Inject 16 Units into the skin at bedtime.    ISOSORBIDE MONONITRATE (IMDUR) 60 MG 24 HR TABLET    Take 60 mg by mouth every morning.    LEVOTHYROXINE (SYNTHROID, LEVOTHROID) 75 MCG TABLET    Take 75-112.5 mcg by mouth daily. Take 1.5 tablets (112.18mcg) on Saturday. Take 1 tablet (75 mcg) daily all other days of the week   LORATADINE (CLARITIN) 10 MG TABLET    Take 10 mg by mouth daily.    LORAZEPAM (ATIVAN) 0.5 MG TABLET    Take 1 mg by mouth every 12 (twelve) hours as needed. Severe agitation   LOSARTAN (COZAAR) 50 MG TABLET    Take 50 mg by mouth daily.   LUBIPROSTONE (AMITIZA) 24 MCG CAPSULE    Take 24 mcg by mouth 2 (two) times daily with a meal.     MEMANTINE (NAMENDA) 10 MG TABLET    Take 10 mg by mouth 2 (two) times daily.   MULTIPLE VITAMINS-MINERALS (CERTA-VITE-LUTEIN PO)    Take 1 tablet by mouth daily.    NITROGLYCERIN (NITROSTAT) 0.4 MG SL TABLET    Place 0.4 mg under the tongue every 5 (five) minutes as needed. For chest pain   NUTRITIONAL SUPPLEMENTS (RESOURCE ARGINAID PO)    Take 1 packet by mouth 2 (two) times daily.   OMEPRAZOLE (PRILOSEC) 20 MG CAPSULE    Take 20 mg by mouth 2 (two) times daily.   OXYMETAZOLINE (AFRIN) 0.05 % NASAL SPRAY    Place 2 sprays into the nose 2 (two) times daily as needed. For congestion   POLYETHYLENE GLYCOL (MIRALAX / GLYCOLAX) PACKET    Take 17 g by mouth  daily as needed.    PRAMOXINE-MINERAL OIL-ZINC (TUCKS) 1-12.5 % RECTAL OINTMENT    Place 1 application rectally as needed for hemorrhoids.   PROBIOTIC PRODUCT (ALIGN) 4 MG CAPS    Take 1 capsule by mouth daily.   PROPRANOLOL (INDERAL) 20 MG TABLET    Take 20 mg by mouth 2 (two) times daily.   SPIRONOLACTONE (ALDACTONE) 25 MG TABLET    Take 25 mg by mouth daily.    VITAMIN D, ERGOCALCIFEROL, (DRISDOL) 50000 UNITS CAPS    Take 50,000 Units by mouth every 30 (thirty) days. On the first of the month  Modified Medications   No medications on file  Discontinued Medications   No medications on file     Physical Exam: Physical Exam  Constitutional: She appears well-developed.  HENT:  Head: Normocephalic and atraumatic.  Left Ear: External ear normal.  Eyes: Conjunctivae and EOM are normal. Pupils are equal, round, and reactive to light.  Neck: Neck supple. No JVD present. No thyromegaly present.  Cardiovascular: Normal rate and regular rhythm.   Murmur heard. Pulmonary/Chest: Effort normal. She has rales.  Bibasilar rales.   Abdominal: Soft. There is no tenderness. There is no rebound.  Musculoskeletal: She exhibits edema.  Back pain is chronic which is well controlled. BLE worsened since Lasix was reduced when her oral intake was poor.   Lymphadenopathy:    She has no cervical adenopathy.  Neurological: She is alert. No cranial nerve deficit. Coordination normal.  Skin: Skin is warm and dry. Rash noted.  Erythematous and chronic swelling in BLE. Left forefoot diabetic ulcer-larger and deeper in size-wound bed is clean with tunneling(priviously protruding center is flattened and is lower than the  skin surface.  Intergluteal cleft skin breakdown with superficial skin missing and erythematous extended to the R+L 1/3 of inner buttocks--new pressure ulcers R+L buttocks near the intergluteal cleft with mirror imaging about a quarter seize stage II presently.  Beefy red buttocks extends to abd.  thighs, lower back with satellite pattern and excoriation-improving. Left forehead hematoma and left knee bruise.   Psychiatric: Her mood appears anxious. Her affect is not blunt, not labile and not inappropriate. Her speech is rapid and/or pressured (occasionally ). Her speech is not delayed and not slurred. She is hyperactive. She is not agitated, not aggressive, not slowed, not withdrawn, not actively hallucinating and not combative. Thought content is not paranoid and not delusional. She does not exhibit a depressed mood. She exhibits abnormal recent memory.  MMSE 21/30 08/2013    Filed Vitals:   12/24/13 1203  BP: 130/80  Pulse: 98  Temp: 98.5 F (36.9 C)  TempSrc: Tympanic  Resp: 18      Labs reviewed: Basic Metabolic Panel:  Recent Labs  01/24/13  05/27/13  07/20/13 1921  08/26/13  11/11/13 11/21/13 12/23/13  NA 139  < > 137  < > 136  < >  --   < > 135* 139 139  K 4.6  < > 4.5  < > 3.9  < >  --   < > 4.3 4.2 3.5  CL  --   --   --   --  97  --   --   --   --   --   --   CO2  --   --   --   --  30  --   --   --   --   --   --   GLUCOSE  --   --   --   --  147*  --   --   --   --   --   --   BUN 41*  < > 44*  < > 46*  < >  --   < > 31* 23* 30*  CREATININE 1.1  < > 1.3*  < > 1.00  < >  --   < > 1.0 0.9 1.0  CALCIUM  --   --   --   --  9.3  --   --   --   --   --   --   TSH 3.68  --  3.96  --   --   --  2.07  --   --   --   --   < > = values in this interval not displayed. Liver Function Tests:  Recent Labs  07/20/13 1921 08/31/13 10/23/13 11/21/13  AST 19 20 34 17  ALT 12 13 16 9   ALKPHOS 62 82 62 67  BILITOT 0.3  --   --   --   PROT 7.0  --   --   --   ALBUMIN 2.8*  --   --   --    CBC:  Recent Labs  07/20/13 1921  10/23/13 11/21/13 12/23/13  WBC 9.3  < > 6.9 7.7 6.7  NEUTROABS 6.5  --   --   --   --   HGB 13.8  < > 15.2 13.7 13.3  HCT 39.7  < > 44 41 40  MCV 97.1  --   --   --   --   PLT 299  < > 245 289 268  < > =  values in this interval not  displayed.  Past Procedures:  10/22/13 CXR patchy interstitial changes left lower lung most consistent with pneumonia  11/21/13 CXR minimal cardiomegaly slightly increased without pulmonary vascular congestion or pleural effusion, no inflammatory consolidate or suspicious nodule, interval resolution of interstitial pneumonitis at the left lower lung.    Assessment/Plan Falls frequently Fell 3:45 am 12/23/13, hit head, sustained left forehead hematoma, no focal neurological deficit, also skin tears to the right knee, UA 12/24/13 50,000c/ml mulitple bacterial morphotypes-denied dysuria, resolved N/V, no CVA or lower back tenderness. CBC and CMP unremarkable. Will continue to monitor the patient and intensive supervision for safety.   Venous stasis dermatitis of both lower extremities From buttocks extend to BLE ankles: open areas buttocks, BLE, left forefoot plantar diabetic foot ulcer: continue to apply Sulfacetamide topical lotion to the open areas bid and Hydrocortisone 1% cream bid to the rest reddened areas    Unspecified hypothyroidism Corrected with Levothyroxine 112.5mcg, last TSH 2.069 08/26/13                                                    Unspecified constipation Stable, takes colace 100 II qhs and Amitiza 61mcg bid and MiraLax prn.                                         Type II or unspecified type diabetes mellitus with peripheral circulatory disorders, uncontrolled(250.72) No further hypoglycemic episodes since Lantus16 units. Continue bolus Novolog 8 units bid. Better controlled of her blood sugar: Hgb A1c 8.2 08/28/13 down to 7.5 12/05/13              PAF (paroxysmal atrial fibrillation) Rate controlled. Propranolol was increased to 20mg  bid for better rate control in setting of HTN and essential tremor.                                        HTN (hypertension) Controlled after Losartan was decreased to 25mg  daily, monitor blood pressure daily.                                                          A-fib Rate controlled. Propranolol was increased to 20mg  bid for better rate control in setting of HTN and essential tremor.                                       Back pain Chronic-coccyalgia in nature, pain is controlled with Fentanyl 167mcg/hr and able to change Norco 10/325 q4hr prn                                                   Dementia with behavioral disturbance Her MMSE 21/3010/04/2013. Given her hx of cardiovascular issues--vascular  dementia is more likely. Tolerated  Namenda and increased Cymbalta to 30mg  bid. Mood is better controlled. Gradual decline in her cognition noted.                      Depression Better with Cymbalta 30mg  bid and newly added Namenda. Observe the patient.                                                 Edema Chronic venous insufficiency and dermatitis. Worse. Increase Furosemide from 40mg  bid to 80mg  in am and 40mg  in pm for 5 days. Observe the patient.                                                    Essential tremor Mild head and hands tremor-not disabling. Takes Propranolol 10mg  bid                                                       Hematoma of frontal scalp Left frontal near hairline, a quarter size, it should healed.   Ischemic heart disease Still had angina occasionally, f/u Cardiology, continue with Imdur 60mg  and Prn NTG(last dose 02/12/13)                            Family/ Staff  Communication: observe the patient.   Goals of Care: SNF  Labs/tests ordered:  none

## 2013-12-24 NOTE — Assessment & Plan Note (Signed)
From buttocks extend to BLE ankles: open areas buttocks, BLE, left forefoot plantar diabetic foot ulcer: continue to apply Sulfacetamide topical lotion to the open areas bid and Hydrocortisone 1% cream bid to the rest reddened areas

## 2013-12-24 NOTE — Assessment & Plan Note (Signed)
Controlled after Losartan was decreased to 25mg daily, monitor blood pressure daily.                                                   

## 2013-12-24 NOTE — Assessment & Plan Note (Signed)
Mild head and hands tremor-not disabling. Takes Propranolol 10mg bid                                              

## 2013-12-29 IMAGING — CR DG CHEST 1V PORT
1 series · 1 of 1 positions shown · non-contrast
Comparison: 11/13/2012

CLINICAL DATA: Syncope and diarrhea.

PORTABLE CHEST - 1 VIEW

[AP]
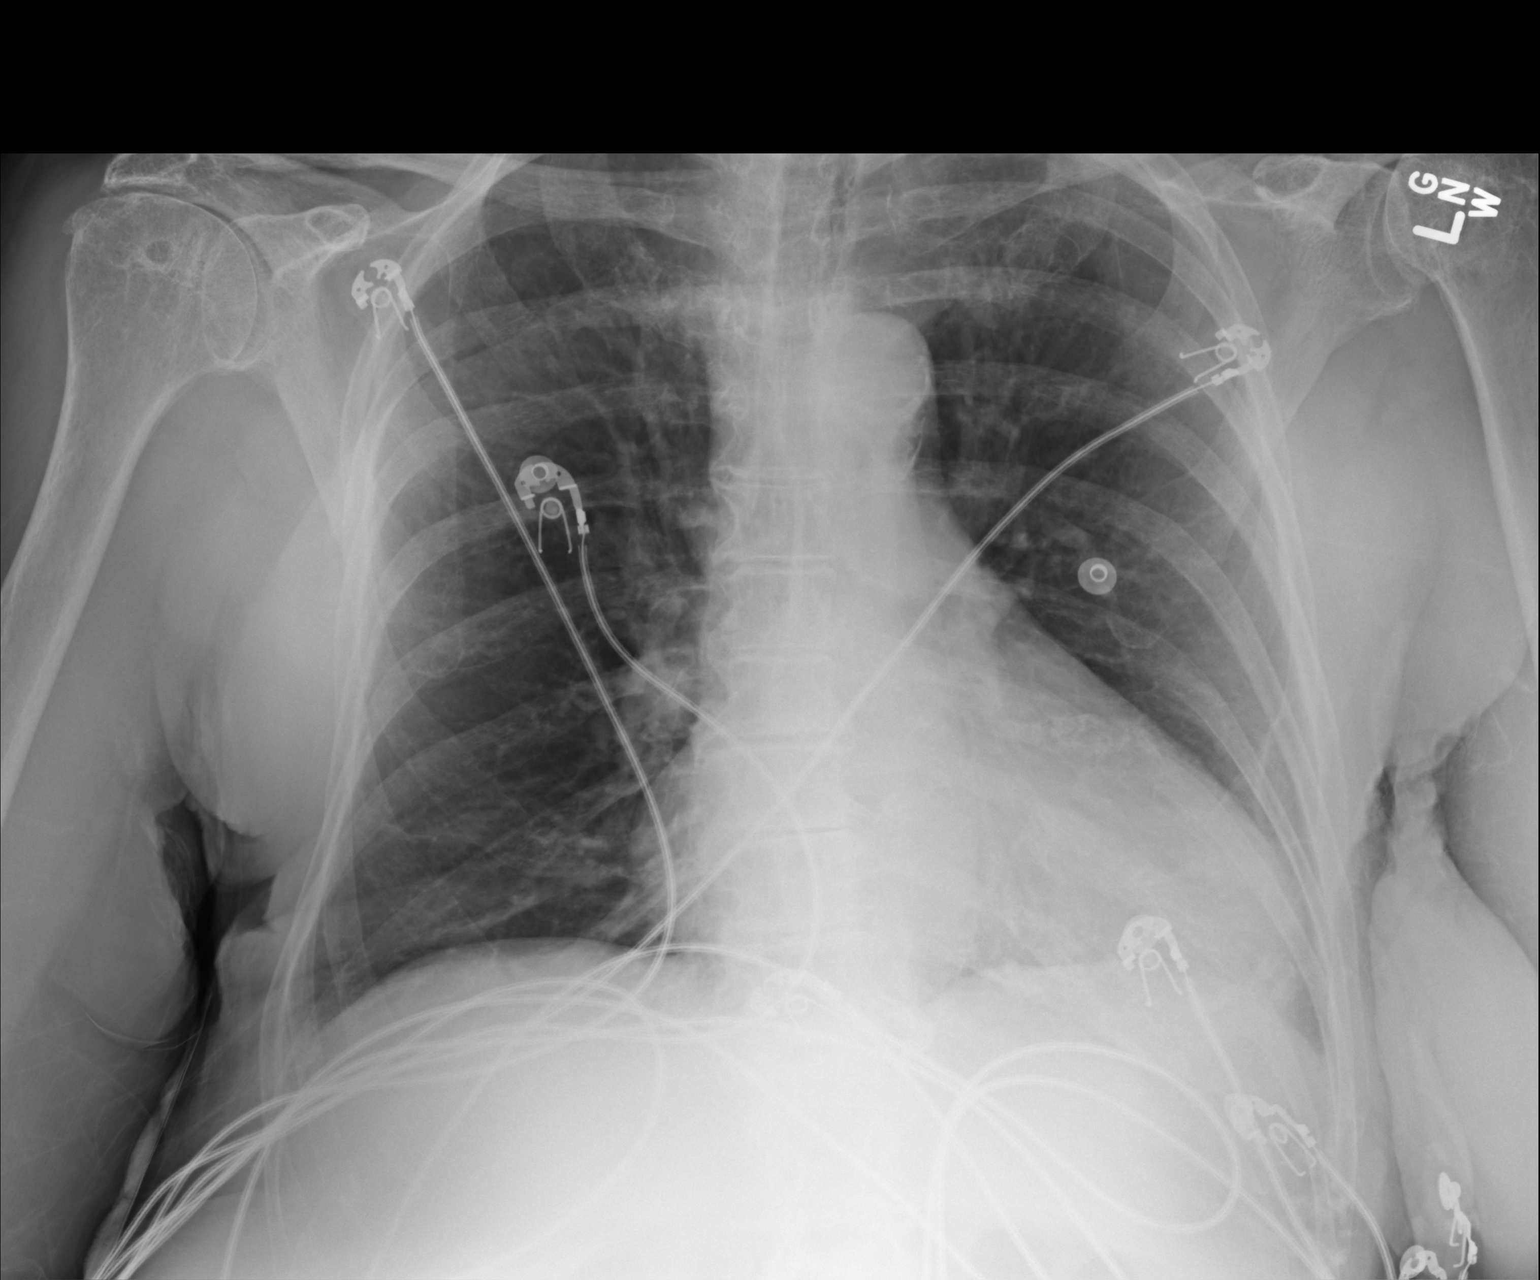

[1 of 1 positions shown; findings below may reference images not displayed]

FINDINGS: Cardiac enlargement with normal pulmonary vascularity.
Linear atelectasis or fibrosis in the lung bases.  No focal
consolidation or airspace disease.  No blunting of costophrenic
angles.  No pneumothorax.  Mediastinal contours appear intact.
Calcification of the aorta.  Degenerative changes in the spine and
shoulders.  No significant change since previous study.
IMPRESSION: Cardiac enlargement.  Linear atelectasis or fibrosis in the lung
bases.  No evidence of active disease.

## 2014-01-14 ENCOUNTER — Non-Acute Institutional Stay (SKILLED_NURSING_FACILITY): Payer: Medicare Other | Admitting: Nurse Practitioner

## 2014-01-14 ENCOUNTER — Encounter: Payer: Self-pay | Admitting: Nurse Practitioner

## 2014-01-14 DIAGNOSIS — L8992 Pressure ulcer of unspecified site, stage 2: Secondary | ICD-10-CM

## 2014-01-14 DIAGNOSIS — K5900 Constipation, unspecified: Secondary | ICD-10-CM

## 2014-01-14 DIAGNOSIS — I480 Paroxysmal atrial fibrillation: Secondary | ICD-10-CM

## 2014-01-14 DIAGNOSIS — F0391 Unspecified dementia with behavioral disturbance: Secondary | ICD-10-CM

## 2014-01-14 DIAGNOSIS — I10 Essential (primary) hypertension: Secondary | ICD-10-CM

## 2014-01-14 DIAGNOSIS — F3289 Other specified depressive episodes: Secondary | ICD-10-CM

## 2014-01-14 DIAGNOSIS — E039 Hypothyroidism, unspecified: Secondary | ICD-10-CM

## 2014-01-14 DIAGNOSIS — E1159 Type 2 diabetes mellitus with other circulatory complications: Secondary | ICD-10-CM

## 2014-01-14 DIAGNOSIS — I872 Venous insufficiency (chronic) (peripheral): Secondary | ICD-10-CM

## 2014-01-14 DIAGNOSIS — G250 Essential tremor: Secondary | ICD-10-CM

## 2014-01-14 DIAGNOSIS — R609 Edema, unspecified: Secondary | ICD-10-CM

## 2014-01-14 DIAGNOSIS — L8990 Pressure ulcer of unspecified site, unspecified stage: Secondary | ICD-10-CM

## 2014-01-14 DIAGNOSIS — L97509 Non-pressure chronic ulcer of other part of unspecified foot with unspecified severity: Secondary | ICD-10-CM

## 2014-01-14 DIAGNOSIS — G252 Other specified forms of tremor: Secondary | ICD-10-CM

## 2014-01-14 DIAGNOSIS — E1169 Type 2 diabetes mellitus with other specified complication: Secondary | ICD-10-CM

## 2014-01-14 DIAGNOSIS — E11621 Type 2 diabetes mellitus with foot ulcer: Secondary | ICD-10-CM

## 2014-01-14 DIAGNOSIS — F03918 Unspecified dementia, unspecified severity, with other behavioral disturbance: Secondary | ICD-10-CM

## 2014-01-14 DIAGNOSIS — F329 Major depressive disorder, single episode, unspecified: Secondary | ICD-10-CM

## 2014-01-14 DIAGNOSIS — I8310 Varicose veins of unspecified lower extremity with inflammation: Secondary | ICD-10-CM

## 2014-01-14 DIAGNOSIS — E871 Hypo-osmolality and hyponatremia: Secondary | ICD-10-CM

## 2014-01-14 DIAGNOSIS — F32A Depression, unspecified: Secondary | ICD-10-CM

## 2014-01-14 DIAGNOSIS — I259 Chronic ischemic heart disease, unspecified: Secondary | ICD-10-CM

## 2014-01-14 DIAGNOSIS — M549 Dorsalgia, unspecified: Secondary | ICD-10-CM

## 2014-01-14 DIAGNOSIS — I4891 Unspecified atrial fibrillation: Secondary | ICD-10-CM

## 2014-01-14 NOTE — Assessment & Plan Note (Signed)
Chronic venous insufficiency and dermatitis. Stable. Chronic. 2+. Continue Furosemide.

## 2014-01-14 NOTE — Assessment & Plan Note (Signed)
Still had angina occasionally, f/u Cardiology, continue with Imdur 60mg and Prn NTG(last dose 02/12/13)                         

## 2014-01-14 NOTE — Assessment & Plan Note (Signed)
Rate controlled. Propranolol was increased to 20mg bid for better rate control in setting of HTN and essential tremor.                                

## 2014-01-14 NOTE — Assessment & Plan Note (Signed)
Left forefoot-stable--non healing.

## 2014-01-14 NOTE — Assessment & Plan Note (Signed)
Stable, takes colace 100 II qhs and Amitiza 24mcg bid and MiraLax prn.   

## 2014-01-14 NOTE — Assessment & Plan Note (Signed)
Better with Cymbalta 30mg bid and newly added Namenda. Observe the patient.    

## 2014-01-14 NOTE — Assessment & Plan Note (Addendum)
R+L buttocks-healing nicely-easily bleed when diaper is peeled off-will have barrier cream bid to affected areas until healed. May use silver nitrate and pressure dressing prn to stop bleed.

## 2014-01-14 NOTE — Assessment & Plan Note (Signed)
Controlled after Losartan was decreased to 25mg daily, monitor blood pressure daily.                                                   

## 2014-01-14 NOTE — Progress Notes (Signed)
Patient ID: [name], female   DOB: [birth year], 80 y.o.   MRN: [id number]   Code Status: DNR  Allergies  Allergen Reactions  . Penicillins Other (See Comments)    Per MAR  . Bee Venom Other (See Comments)    Per MAR  . Celebrex [Celecoxib] Other (See Comments)    Per MAR  . Lidocaine Hcl Other (See Comments)    Per MAR  . Phenobarbital Other (See Comments)    Per MAR  . Procaine Hcl Other (See Comments)    Per mar    Chief Complaint  Patient presents with  . Medical Managment of Chronic Issues    buttocks pressure ulcers.   . Acute Visit    HPI: Patient is a 80 y.o. female seen in the SNF at [facility] today for evaluation of pressure ulcer R+L buttocks, BLE edeam and other chronic medical conditions.  Problem List Items Addressed This Visit   Venous stasis dermatitis of both lower extremities - Primary     Chronic venous insufficiency and dermatitis. Compression wrap. No s/s of infection except chronic erythema and swelling.     Unspecified hypothyroidism (Chronic)     Corrected with Levothyroxine 112.104mcg, last TSH 2.069 08/26/13      Unspecified constipation     Stable, takes colace 100 II qhs and Amitiza 7mcg bid and MiraLax prn.    Ulcer of foot due to diabetes mellitus     Left forefoot-stable--non healing.     Type II or unspecified type diabetes mellitus with peripheral circulatory disorders, uncontrolled(250.72) (Chronic)     No further hypoglycemic episodes since Lantus16 units. Continue bolus Novolog 8 units bid. Better controlled of her blood sugar: Hgb A1c 8.2 08/28/13 down to 7.5 12/05/13     Pressure ulcer stage II     R+L buttocks-healing nicely-easily bleed when diaper is peeled off-will have barrier cream bid to affected areas until healed. May use silver nitrate and pressure dressing prn to stop bleed.     PAF (paroxysmal atrial fibrillation)     Rate controlled. Propranolol was increased to 20mg  bid for better rate control in setting of  HTN and essential tremor.       Ischemic heart disease     Still had angina occasionally, f/u Cardiology, continue with Imdur 60mg  and Prn NTG(last dose 02/12/13)       Hyponatremia     Resolved, Na 139 11/21/13     HTN (hypertension)     Controlled after Losartan was decreased to 25mg  daily, monitor blood pressure daily.     Essential tremor     Mild head and hands tremor-not disabling. Takes Propranolol 10mg  bid     Edema     Chronic venous insufficiency and dermatitis. Stable. Chronic. 2+. Continue Furosemide.       Depression     Better with Cymbalta 30mg  bid and newly added Namenda. Observe the patient.       Dementia with behavioral disturbance     Her MMSE 21/3010/04/2013. Given her hx of cardiovascular issues--vascular dementia is more likely. Tolerated  Namenda and increased Cymbalta to 30mg  bid. Mood is better controlled. Gradual decline in her cognition noted.     Back pain     Chronic-coccyalgia in nature, pain is controlled with Fentanyl 11mcg/hr and able to change Norco 10/325 q4hr prn      A-fib     Rate controlled. Propranolol was increased to 20mg  bid for better rate control in setting of HTN  and essential tremor.          Review of Systems:  Review of Systems  Constitutional: Negative for fever, chills, weight loss, malaise/fatigue and diaphoresis.  HENT: Positive for hearing loss. Negative for congestion, ear discharge and sore throat.   Eyes: Negative for pain, discharge and redness.  Respiratory: Negative for cough, sputum production, shortness of breath and wheezing.   Cardiovascular: Positive for leg swelling and PND. Negative for chest pain, palpitations, orthopnea and claudication.  Gastrointestinal: Negative for heartburn, nausea, vomiting, abdominal pain, diarrhea, constipation and blood in stool.  Genitourinary: Positive for frequency. Negative for dysuria, urgency and flank pain.  Musculoskeletal: Positive for back pain, falls and joint  pain. Negative for myalgias and neck pain.       Able to ambulate with walker.   Skin: Positive for rash (chronic BLE venous dermatitis. Left medial forefoot diabetic foot ulcer). Negative for itching.       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 Intergluteal cleft erythema and superficial skin missing-worse-beefy red extends to thighs/abd/lower back-healing nicely.  Left forefoot diabetic ulcer--chronic about a quarter size. Pressure ulcers at R+L buttocks-in mirror image-2x2cm and superficial-healing nicely-bleeding when peeled diaper off  Neurological: Positive for tremors. Negative for dizziness, tingling, sensory change, speech change, focal weakness, seizures, loss of consciousness, weakness and headaches.  Endo/Heme/Allergies: Negative for environmental allergies and polydipsia. Does not bruise/bleed easily.  Psychiatric/Behavioral: Positive for depression and memory loss. Negative for hallucinations. The patient is nervous/anxious and has insomnia.      Past Medical History  Diagnosis Date  . Diabetes mellitus   . Hypertension   . Heart murmur   . Benign familial tremor   . Rosacea   . Diverticulitis   . Heart failure     diastolic heart failure  . TIA (transient ischemic attack) 2010  . Arthritis   . Breast cancer     breast -rt  . CAD (coronary artery disease)     single vessel CAD per cath in 2006; scattered nonobstructive disease in the left system, left to right collaterals to the RCA which was occluded by flush shots; she has been managed medically   Past Surgical History  Procedure Laterality Date  . Mastectomy partial / lumpectomy  11/02/2000    Right, with SLN  . Mastectomy partial / lumpectomy  05/15/2008    Left  . Breast lumpectomy  01/19/2010    right  . Mastectomy  03/31/2010    Right   Social History:   reports that she has never smoked. She does not have any smokeless tobacco history on file. She reports that she does not drink alcohol or use illicit  drugs.  Medications: Patient's Medications  New Prescriptions   No medications on file  Previous Medications   ACETAMINOPHEN (MAPAP) 325 MG TABLET    Take 650 mg by mouth 2 (two) times daily.   ALOE-SODIUM CHLORIDE (AYR SALINE NASAL GEL NA)    Place 1 application into the nose 2 (two) times daily. Both nostrils   ASPIRIN 81 MG CHEWABLE TABLET    Chew 81 mg by mouth every morning.    DOCUSATE SODIUM (COLACE) 100 MG CAPSULE    Take 100 mg by mouth 2 (two) times daily.    DORZOLAMIDE-TIMOLOL (COSOPT) 22.3-6.8 MG/ML OPHTHALMIC SOLUTION    Place 1 drop into the left eye every 12 (twelve) hours.   DULOXETINE (CYMBALTA) 30 MG CAPSULE    Take 30 mg by mouth 2 (two) times daily.   FENTANYL ([place] -  DOSED MCG/HR) 100 MCG/HR    Apply 1 patch every 3 hours. Change site   FUROSEMIDE (LASIX) 40 MG TABLET    Take 40 mg by mouth 2 (two) times daily.    HYDROCODONE-ACETAMINOPHEN (NORCO) 10-325 MG PER TABLET    every 4 (four) hours as needed. Take 1/2 tablet by mouth every 4 hours for pain as needed.   HYDROXYZINE (ATARAX/VISTARIL) 25 MG TABLET    Take 25 mg by mouth at bedtime as needed. For itching   INSULIN ASPART (NOVOLOG) 100 UNIT/ML INJECTION    Inject 8 Units into the skin 2 (two) times daily. If CBG is greater than 150   INSULIN GLARGINE (LANTUS) 100 UNIT/ML INJECTION    Inject 16 Units into the skin at bedtime.    ISOSORBIDE MONONITRATE (IMDUR) 60 MG 24 HR TABLET    Take 60 mg by mouth every morning.    LEVOTHYROXINE (SYNTHROID, LEVOTHROID) 75 MCG TABLET    Take 75-112.5 mcg by mouth daily. Take 1.5 tablets (112.60mcg) on Saturday. Take 1 tablet (75 mcg) daily all other days of the week   LORATADINE (CLARITIN) 10 MG TABLET    Take 10 mg by mouth daily.    LORAZEPAM (ATIVAN) 0.5 MG TABLET    Take 1 mg by mouth every 12 (twelve) hours as needed. Severe agitation   LOSARTAN (COZAAR) 50 MG TABLET    Take 50 mg by mouth daily.   LUBIPROSTONE (AMITIZA) 24 MCG CAPSULE    Take 24 mcg by mouth 2 (two) times  daily with a meal.     MEMANTINE (NAMENDA) 10 MG TABLET    Take 10 mg by mouth 2 (two) times daily.   MULTIPLE VITAMINS-MINERALS (CERTA-VITE-LUTEIN PO)    Take 1 tablet by mouth daily.    NITROGLYCERIN (NITROSTAT) 0.4 MG SL TABLET    Place 0.4 mg under the tongue every 5 (five) minutes as needed. For chest pain   NUTRITIONAL SUPPLEMENTS (RESOURCE ARGINAID PO)    Take 1 packet by mouth 2 (two) times daily.   OMEPRAZOLE (PRILOSEC) 20 MG CAPSULE    Take 20 mg by mouth 2 (two) times daily.   OXYMETAZOLINE (AFRIN) 0.05 % NASAL SPRAY    Place 2 sprays into the nose 2 (two) times daily as needed. For congestion   POLYETHYLENE GLYCOL (MIRALAX / GLYCOLAX) PACKET    Take 17 g by mouth daily as needed.    PRAMOXINE-MINERAL OIL-ZINC (TUCKS) 1-12.5 % RECTAL OINTMENT    Place 1 application rectally as needed for hemorrhoids.   PROBIOTIC PRODUCT (ALIGN) 4 MG CAPS    Take 1 capsule by mouth daily.   PROPRANOLOL (INDERAL) 20 MG TABLET    Take 20 mg by mouth 2 (two) times daily.   SPIRONOLACTONE (ALDACTONE) 25 MG TABLET    Take 25 mg by mouth daily.    VITAMIN D, ERGOCALCIFEROL, (DRISDOL) 50000 UNITS CAPS    Take 50,000 Units by mouth every 30 (thirty) days. On the first of the month  Modified Medications   No medications on file  Discontinued Medications   No medications on file     Physical Exam: Physical Exam  Constitutional: She appears well-developed.  HENT:  Head: Normocephalic and atraumatic.  Left Ear: External ear normal.  Eyes: Conjunctivae and EOM are normal. Pupils are equal, round, and reactive to light.  Neck: Neck supple. No JVD present. No thyromegaly present.  Cardiovascular: Normal rate and regular rhythm.   Murmur heard. Pulmonary/Chest: Effort normal. She has rales.  Bibasilar rales.   Abdominal: Soft. There is no tenderness. There is no rebound.  Musculoskeletal: She exhibits edema.  Back pain is chronic which is well controlled. BLE worsened since Lasix was reduced when her oral  intake was poor.   Lymphadenopathy:    She has no cervical adenopathy.  Neurological: She is alert. No cranial nerve deficit. Coordination normal.  Skin: Skin is warm and dry. Rash noted.  Erythematous and chronic swelling in BLE. Left forefoot diabetic ulcer-larger and deeper in size-wound bed is clean with tunneling(priviously protruding center is flattened and is lower than the skin surface.  Intergluteal cleft skin breakdown with superficial skin missing and erythematous extended to the R+L 1/3 of inner buttocks--new pressure ulcers R+L buttocks near the intergluteal cleft with mirror imaging about a quarter seize stage II presently-.bleeding when peeled diaper off.   Beefy red buttocks extends to abd. thighs, lower back with satellite pattern and excoriation-improving. Left forehead hematoma and left knee bruise.   Psychiatric: Her mood appears anxious. Her affect is not blunt, not labile and not inappropriate. Her speech is rapid and/or pressured (occasionally ). Her speech is not delayed and not slurred. She is hyperactive. She is not agitated, not aggressive, not slowed, not withdrawn, not actively hallucinating and not combative. Thought content is not paranoid and not delusional. She does not exhibit a depressed mood. She exhibits abnormal recent memory.  MMSE 21/30 08/2013    Filed Vitals:   01/14/14 1541  BP: 140/86  Pulse: 86  Temp: 97.2 F (36.2 C)  TempSrc: Tympanic  Resp: 22      Labs reviewed: Basic Metabolic Panel:  Recent Labs  01/24/13  05/27/13  07/20/13 1921  08/26/13  11/11/13 11/21/13 12/23/13  NA 139  < > 137  < > 136  < >  --   < > 135* 139 139  K 4.6  < > 4.5  < > 3.9  < >  --   < > 4.3 4.2 3.5  CL  --   --   --   --  97  --   --   --   --   --   --   CO2  --   --   --   --  30  --   --   --   --   --   --   GLUCOSE  --   --   --   --  147*  --   --   --   --   --   --   BUN 41*  < > 44*  < > 46*  < >  --   < > 31* 23* 30*  CREATININE 1.1  < > 1.3*  < >  1.00  < >  --   < > 1.0 0.9 1.0  CALCIUM  --   --   --   --  9.3  --   --   --   --   --   --   TSH 3.68  --  3.96  --   --   --  2.07  --   --   --   --   < > = values in this interval not displayed. Liver Function Tests:  Recent Labs  07/20/13 1921 08/31/13 10/23/13 11/21/13  AST 19 20 34 17  ALT 12 13 16 9   ALKPHOS 62 82 62 67  BILITOT 0.3  --   --   --  PROT 7.0  --   --   --   ALBUMIN 2.8*  --   --   --    CBC:  Recent Labs  07/20/13 1921  10/23/13 11/21/13 12/23/13  WBC 9.3  < > 6.9 7.7 6.7  NEUTROABS 6.5  --   --   --   --   HGB 13.8  < > 15.2 13.7 13.3  HCT 39.7  < > 44 41 40  MCV 97.1  --   --   --   --   PLT 299  < > 245 289 268  < > = values in this interval not displayed.  Past Procedures:  10/22/13 CXR patchy interstitial changes left lower lung most consistent with pneumonia  11/21/13 CXR minimal cardiomegaly slightly increased without pulmonary vascular congestion or pleural effusion, no inflammatory consolidate or suspicious nodule, interval resolution of interstitial pneumonitis at the left lower lung.    Assessment/Plan Venous stasis dermatitis of both lower extremities Chronic venous insufficiency and dermatitis. Compression wrap. No s/s of infection except chronic erythema and swelling.   Unspecified hypothyroidism Corrected with Levothyroxine 112.6mcg, last TSH 2.069 08/26/13    Unspecified constipation Stable, takes colace 100 II qhs and Amitiza 62mcg bid and MiraLax prn.  Ulcer of foot due to diabetes mellitus Left forefoot-stable--non healing.   Type II or unspecified type diabetes mellitus with peripheral circulatory disorders, uncontrolled(250.72) No further hypoglycemic episodes since Lantus16 units. Continue bolus Novolog 8 units bid. Better controlled of her blood sugar: Hgb A1c 8.2 08/28/13 down to 7.5 12/05/13   PAF (paroxysmal atrial fibrillation) Rate controlled. Propranolol was increased to 20mg  bid for better rate control in  setting of HTN and essential tremor.     Ischemic heart disease Still had angina occasionally, f/u Cardiology, continue with Imdur 60mg  and Prn NTG(last dose 02/12/13)     Hyponatremia Resolved, Na 139 11/21/13   HTN (hypertension) Controlled after Losartan was decreased to 25mg  daily, monitor blood pressure daily.   Essential tremor Mild head and hands tremor-not disabling. Takes Propranolol 10mg  bid   Edema Chronic venous insufficiency and dermatitis. Stable. Chronic. 2+. Continue Furosemide.     Depression Better with Cymbalta 30mg  bid and newly added Namenda. Observe the patient.     Dementia with behavioral disturbance Her MMSE 21/3010/04/2013. Given her hx of cardiovascular issues--vascular dementia is more likely. Tolerated  Namenda and increased Cymbalta to 30mg  bid. Mood is better controlled. Gradual decline in her cognition noted.   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 Propranolol was increased to 20mg  bid for better rate control in setting of HTN and essential tremor.     Pressure ulcer stage II R+L buttocks-healing nicely-easily bleed when diaper is peeled off-will have barrier cream bid to affected areas until healed. May use silver nitrate and pressure dressing prn to stop bleed.     Family/ Staff Communication: observe the patient.   Goals of Care: SNF  Labs/tests ordered:  none

## 2014-01-14 NOTE — Assessment & Plan Note (Signed)
Her MMSE 21/3010/04/2013. Given her hx of cardiovascular issues--vascular dementia is more likely. Tolerated  Namenda and increased Cymbalta to 30mg  bid. Mood is better controlled. Gradual decline in her cognition noted.

## 2014-01-14 NOTE — Assessment & Plan Note (Signed)
No further hypoglycemic episodes since Lantus16 units. Continue bolus Novolog 8 units bid. Better controlled of her blood sugar: Hgb A1c 8.2 08/28/13 down to 7.5 12/05/13             

## 2014-01-14 NOTE — Assessment & Plan Note (Signed)
Corrected with Levothyroxine 112.5mcg, last TSH 2.069 08/26/13    

## 2014-01-14 NOTE — Assessment & Plan Note (Signed)
Resolved, Na 139 11/21/13 

## 2014-01-14 NOTE — Assessment & Plan Note (Signed)
Chronic-coccyalgia in nature, pain is controlled with Fentanyl 100mcg/hr and able to change Norco 10/325 q4hr prn                                                  

## 2014-01-14 NOTE — Assessment & Plan Note (Signed)
Chronic venous insufficiency and dermatitis. Compression wrap. No s/s of infection except chronic erythema and swelling.

## 2014-01-14 NOTE — Assessment & Plan Note (Signed)
Mild head and hands tremor-not disabling. Takes Propranolol 10mg bid                                              

## 2014-01-29 ENCOUNTER — Encounter (HOSPITAL_COMMUNITY): Payer: Self-pay | Admitting: Emergency Medicine

## 2014-01-29 ENCOUNTER — Emergency Department (HOSPITAL_COMMUNITY): Payer: Medicare Other

## 2014-01-29 ENCOUNTER — Emergency Department (HOSPITAL_COMMUNITY)
Admission: EM | Admit: 2014-01-29 | Discharge: 2014-01-29 | Disposition: A | Discharge status: Home / Self Care | Payer: Medicare Other | Attending: Emergency Medicine | Admitting: Emergency Medicine

## 2014-01-29 DIAGNOSIS — Z853 Personal history of malignant neoplasm of breast: Secondary | ICD-10-CM | POA: Insufficient documentation

## 2014-01-29 DIAGNOSIS — I5030 Unspecified diastolic (congestive) heart failure: Secondary | ICD-10-CM | POA: Insufficient documentation

## 2014-01-29 DIAGNOSIS — Y9210 Unspecified residential institution as the place of occurrence of the external cause: Secondary | ICD-10-CM | POA: Insufficient documentation

## 2014-01-29 DIAGNOSIS — Z872 Personal history of diseases of the skin and subcutaneous tissue: Secondary | ICD-10-CM | POA: Insufficient documentation

## 2014-01-29 DIAGNOSIS — Z8673 Personal history of transient ischemic attack (TIA), and cerebral infarction without residual deficits: Secondary | ICD-10-CM | POA: Insufficient documentation

## 2014-01-29 DIAGNOSIS — I2510 Atherosclerotic heart disease of native coronary artery without angina pectoris: Secondary | ICD-10-CM | POA: Insufficient documentation

## 2014-01-29 DIAGNOSIS — Z880 Allergy status to penicillin: Secondary | ICD-10-CM | POA: Insufficient documentation

## 2014-01-29 DIAGNOSIS — M129 Arthropathy, unspecified: Secondary | ICD-10-CM | POA: Insufficient documentation

## 2014-01-29 DIAGNOSIS — Z9181 History of falling: Secondary | ICD-10-CM | POA: Insufficient documentation

## 2014-01-29 DIAGNOSIS — Z794 Long term (current) use of insulin: Secondary | ICD-10-CM | POA: Insufficient documentation

## 2014-01-29 DIAGNOSIS — S0990XA Unspecified injury of head, initial encounter: Secondary | ICD-10-CM | POA: Insufficient documentation

## 2014-01-29 DIAGNOSIS — R011 Cardiac murmur, unspecified: Secondary | ICD-10-CM | POA: Insufficient documentation

## 2014-01-29 DIAGNOSIS — I10 Essential (primary) hypertension: Secondary | ICD-10-CM | POA: Insufficient documentation

## 2014-01-29 DIAGNOSIS — Z79899 Other long term (current) drug therapy: Secondary | ICD-10-CM | POA: Insufficient documentation

## 2014-01-29 DIAGNOSIS — F0390 Unspecified dementia without behavioral disturbance: Secondary | ICD-10-CM | POA: Insufficient documentation

## 2014-01-29 DIAGNOSIS — I4891 Unspecified atrial fibrillation: Secondary | ICD-10-CM | POA: Insufficient documentation

## 2014-01-29 DIAGNOSIS — W06XXXA Fall from bed, initial encounter: Secondary | ICD-10-CM | POA: Insufficient documentation

## 2014-01-29 DIAGNOSIS — E119 Type 2 diabetes mellitus without complications: Secondary | ICD-10-CM | POA: Insufficient documentation

## 2014-01-29 DIAGNOSIS — Y9389 Activity, other specified: Secondary | ICD-10-CM | POA: Insufficient documentation

## 2014-01-29 NOTE — ED Provider Notes (Signed)
CSN: [id number]     Arrival date & time 01/29/14  0509 History   First MD Initiated Contact with Patient 01/29/14 0533     Chief Complaint  Patient presents with  . Fall     (Consider location/radiation/quality/duration/timing/severity/associated sxs/prior Treatment) HPI 80 year old nursing home patient who is DO NOT RESUSCITATE has a history of generalized weakness dementia with agitation frequent falls chronic atrial fibrillation chronic edema with skin ulcers was given Norco for uncertain reason tonight she became more confused than usual and fell at the back of her head causing a small bruise to the back of her head as the patient was sent to the ED. Apparently this was a witnessed fall with no loss of consciousness no change in the patient's behavior she is acting at her baseline now she moves all 4 extremities she has no complaints she denies headache neck pain back pain chest pain shortness breath abdominal pain or pain to her arms or legs. She is moving all 4 extremities well to command. There is no treatment prior to arrival. Past Medical History  Diagnosis Date  . Diabetes mellitus   . Hypertension   . Heart murmur   . Benign familial tremor   . Rosacea   . Diverticulitis   . Heart failure     diastolic heart failure  . TIA (transient ischemic attack) 2010  . Arthritis   . Breast cancer     breast -rt  . CAD (coronary artery disease)     single vessel CAD per cath in 2006; scattered nonobstructive disease in the left system, left to right collaterals to the RCA which was occluded by flush shots; she has been managed medically   Past Surgical History  Procedure Laterality Date  . Mastectomy partial / lumpectomy  11/02/2000    Right, with SLN  . Mastectomy partial / lumpectomy  05/15/2008    Left  . Breast lumpectomy  01/19/2010    right  . Mastectomy  03/31/2010    Right   No family history on file. History  Substance Use Topics  . Smoking status: Never Smoker   .  Smokeless tobacco: Not on file  . Alcohol Use: No   OB History   Grav Para Term Preterm Abortions TAB SAB Ect Mult Living                 Review of Systems  10 Systems reviewed and are negative for acute change except as noted in the HPI.  Allergies  Penicillins; Bee venom; Celebrex; Lidocaine hcl; Phenobarbital; and Procaine hcl  Home Medications   Current Outpatient Rx  Name  Route  Sig  Dispense  Refill  . alum & mag hydroxide-simeth (MAALOX/MYLANTA) 200-200-20 MG/5ML suspension   Oral   Take 30 mLs by mouth every 6 (six) hours as needed for indigestion or heartburn.         Skipper Cliche SALINE NASAL NO-DRIP GEL   Nasal   Place into the nose 2 (two) times daily as needed (nose bleeds).         . docusate sodium (COLACE) 100 MG capsule   Oral   Take 200 mg by mouth at bedtime.          . dorzolamide-timolol (COSOPT) 22.3-6.8 MG/ML ophthalmic solution   Left Eye   Place 1 drop into the left eye every 12 (twelve) hours.         . DULoxetine (CYMBALTA) 30 MG capsule   Oral   Take 30  mg by mouth 2 (two) times daily.         . fentaNYL (DURAGESIC - DOSED MCG/HR) 100 MCG/HR      Apply 1 patch every 3 hours. Change site   10 patch   0   . furosemide (LASIX) 40 MG tablet   Oral   Take 40 mg by mouth 2 (two) times daily.          [name] glucagon (GLUCAGEN) 1 MG SOLR injection   Intravenous   Inject 1 mg into the vein once as needed for low blood sugar.         [name] glucosamine-chondroitin 500-400 MG tablet   Oral   Take 1 tablet by mouth 3 (three) times daily.         [name] guaifenesin (ROBITUSSIN) 100 MG/5ML syrup   Oral   Take 300 mg by mouth 3 (three) times daily as needed for cough.         [name] HYDROcodone-acetaminophen (NORCO) 10-325 MG per tablet      every 4 (four) hours as needed. Take 1/2 tablet by mouth every 4 hours for pain as needed.         . hydrOXYzine (ATARAX/VISTARIL) 25 MG tablet   Oral   Take 25 mg by mouth at bedtime as needed. For  itching         . insulin aspart (NOVOLOG) 100 UNIT/ML injection   Subcutaneous   Inject 8-12 Units into the skin 3 (three) times daily with meals. 8 units If CBG is greater than 150 at lunch and 12 units if CBG is greater than 150 at breakfast and dinner         . insulin glargine (LANTUS) 100 UNIT/ML injection   Subcutaneous   Inject 16 Units into the skin at bedtime.          . isosorbide mononitrate (IMDUR) 60 MG 24 hr tablet   Oral   Take 60 mg by mouth every morning.          [name] levothyroxine (SYNTHROID, LEVOTHROID) 75 MCG tablet   Oral   Take 75-112.5 mcg by mouth daily. Take 1 and 1/2 tablets on Saturdays then take 1 tablet all the other days         . loratadine (CLARITIN) 10 MG tablet   Oral   Take 10 mg by mouth daily.          [name] LORazepam (ATIVAN) 0.5 MG tablet   Oral   Take 1 mg by mouth every 12 (twelve) hours as needed. Severe agitation         . losartan (COZAAR) 25 MG tablet   Oral   Take 25 mg by mouth daily.         [name] lubiprostone (AMITIZA) 24 MCG capsule   Oral   Take 24 mcg by mouth 2 (two) times daily with a meal.           . memantine (NAMENDA) 10 MG tablet   Oral   Take 10 mg by mouth 2 (two) times daily.         . methylcellulose (ARTIFICIAL TEARS) 1 % ophthalmic solution   Both Eyes   Place 1 drop into both eyes 4 (four) times daily as needed (for dryness).         . Multiple Vitamins-Minerals (CERTAVITE SENIOR/ANTIOXIDANT PO)   Oral   Take 1 tablet by mouth daily.         . nitroGLYCERIN (NITROSTAT) 0.4 MG  SL tablet   Sublingual   Place 0.4 mg under the tongue every 5 (five) minutes as needed. For chest pain         . Nutritional Supplements (RESOURCE 2.0 PO)   Oral   Take 120 mLs by mouth 3 (three) times daily.         [name] omeprazole (PRILOSEC) 20 MG capsule   Oral   Take 20 mg by mouth 2 (two) times daily.         . Probiotic Product (ALIGN) 4 MG CAPS   Oral   Take 1 capsule by mouth daily.          . propranolol (INDERAL) 20 MG tablet   Oral   Take 20 mg by mouth 2 (two) times daily.         [name] spironolactone (ALDACTONE) 25 MG tablet   Oral   Take 25 mg by mouth daily.          . Vitamin D, Ergocalciferol, (DRISDOL) 50000 UNITS CAPS   Oral   Take 50,000 Units by mouth every 30 (thirty) days. On the first of the month          BP 141/99  Pulse 77  Temp(Src) 98.1 F (36.7 C) (Oral)  Resp 20  Ht 5\' 4"  (1.626 m)  Wt 175 lb (79.379 kg)  BMI 30.02 kg/m2  SpO2 89% Physical Exam  Nursing note and vitals reviewed. Constitutional:  Awake, alert, nontoxic appearance with baseline speech for patient.  HENT:  Mouth/Throat: No oropharyngeal exudate.  Approximately 2 cm diameter occipital scalp hematoma with superficial abrasion  Eyes: EOM are normal. Pupils are equal, round, and reactive to light. Right eye exhibits no discharge. Left eye exhibits no discharge.  Neck: Neck supple.  Cervical spine nontender  Cardiovascular: Normal rate.   No murmur heard. Irregular rhythm  Pulmonary/Chest: Effort normal and breath sounds normal. No stridor. No respiratory distress. She has no wheezes. She has no rales. She exhibits no tenderness.  Abdominal: Soft. Bowel sounds are normal. She exhibits no mass. There is no tenderness. There is no rebound.  Musculoskeletal: She exhibits edema. She exhibits no tenderness.  Baseline ROM, moves extremities with no obvious new focal weakness. Both arms and legs nontender with nontender hips. She has dressings on her lower legs where she has chronic edema with some stasis dermatitis changes noted proximal to the dressings.  Lymphadenopathy:    She has no cervical adenopathy.  Neurological: She is alert.  Awake, alert, cooperative and aware of situation; motor strength 4/5 bilaterally; sensation normal to light touch bilaterally; peripheral visual fields full to confrontation; no facial asymmetry; tongue midline; major cranial nerves appear intact; no  pronator drift, normal finger to nose bilaterally  Skin: No rash noted.  Psychiatric: She has a normal mood and affect.    ED Course  Procedures (including critical care time) Patient / Family / Caregiver informed of clinical course, understand medical decision-making process, and agree with plan. Labs Review Labs Reviewed - No data to display Imaging Review No results found.   EKG Interpretation None      MDM   Final diagnoses:  Minor head injury  Dementia    I doubt any other EMC precluding discharge at this time including, but not necessarily limited to the following:ICH, CSI.    Babette Relic, MD 01/31/14 1322

## 2014-01-29 NOTE — ED Notes (Signed)
Pt from NH([facility]).  Pt was sent here for eval of multiple falls tonight.  Facility sts that she has gotten out of bed multiple times.  Spoke with Rn at facility regarding pt.  Sts that she received Norco last night for pain and after taking that she was confused and unsteady.  No obvious injuries noted  Pt has no c/o at this time.

## 2014-01-29 NOTE — ED Notes (Signed)
Dr Stevie Kern at bedside for exam.

## 2014-01-29 NOTE — Discharge Instructions (Signed)

## 2014-01-29 NOTE — ED Notes (Signed)
Pt does have 1inch abrasion to back of head noted. No laceration noted.

## 2014-01-29 NOTE — ED Notes (Signed)
Pt attempting to get out of bed. Pt reoriented to place and is resting now.

## 2014-02-03 ENCOUNTER — Ambulatory Visit (INDEPENDENT_AMBULATORY_CARE_PROVIDER_SITE_OTHER): Payer: Medicare Other | Admitting: Cardiology

## 2014-02-03 ENCOUNTER — Encounter: Payer: Self-pay | Admitting: Cardiology

## 2014-02-03 VITALS — BP 115/64 | HR 78 | Ht 64.0 in

## 2014-02-03 DIAGNOSIS — I480 Paroxysmal atrial fibrillation: Secondary | ICD-10-CM

## 2014-02-03 DIAGNOSIS — W19XXXA Unspecified fall, initial encounter: Secondary | ICD-10-CM

## 2014-02-03 DIAGNOSIS — Y92129 Unspecified place in nursing home as the place of occurrence of the external cause: Secondary | ICD-10-CM

## 2014-02-03 DIAGNOSIS — I4891 Unspecified atrial fibrillation: Secondary | ICD-10-CM

## 2014-02-03 DIAGNOSIS — I259 Chronic ischemic heart disease, unspecified: Secondary | ICD-10-CM

## 2014-02-03 DIAGNOSIS — E1159 Type 2 diabetes mellitus with other circulatory complications: Secondary | ICD-10-CM

## 2014-02-03 NOTE — Assessment & Plan Note (Signed)
Denies hypoglycemic symptoms

## 2014-02-03 NOTE — Progress Notes (Signed)
[name] Date of Birth:  [birth year] [address] [place], [name]  [address] [phone]         Fax   [phone]  History of Present Illness: Ms. Hinderliter is seen back today for a six-month followup office visit. She is a former patient of Dr. Janyth Pupa.  Her PCP is Dr. Jeanmarie Hubert at [facility]. She goes by the first name "[name]". She has a history of chronic pain, GERD, hypothyroidism, HTN, HLD, CKD, anxiety, OA, breast cancer and single vessel CAD per cath back in 2006. She had a nuclear stress test back in April of 2011 showing normal EF. Her perfusion imaging was abnormal but correlates to her known occluded RCA. She has been managed medically. Last echo was about 3 years ago with results as noted below. . Review of her records from [facility] show that she has had issues with pressure ulcers and general aging concerns. The patient now lives in the health care skilled nursing facility section [facility] [place]. Her husband lives in the assisted living section. They have lunch and supper together.  Since last visit she had a fall a month ago at the nursing home after she tripped.  She did not have syncope.  This occurred on 01/29/2014.  She suffered a minor abrasion to her scalp.  She went to the emergency room where a CT of the head showed atrophy but no acute damage from the fall. The patient previously had been on Coumadin but now is on just a baby aspirin for anticoagulation. She still has frequent nosebleeds. She has painful arthritis and is on fentanyl patch  Since we last saw her [facility] after having an episode of syncope at home.  She was found to be in new atrial flutter fibrillation.  She is not a candidate for anticoagulation.  She declined hospitalization.  Her beta blocker was increased at that emergency room visit up to propranolol 20 mg twice a day.  He returns now for followup.  She states that she has been feeling  well and has had no further episodes of dizziness or syncope. Her last echocardiogram was 08/14/12 and showed an ejection fraction of 60-65% with grade 2 diastolic dysfunction and no significant valve abnormalities.  Since last visit she has been having no new cardiac concerns.  She continues to have problems with lower extremity venous stasis ulcerations which require wrapping dressing every several days by nursing staff in the skilled nursing facility.   Current Outpatient Prescriptions  Medication Sig Dispense Refill  . alum & mag hydroxide-simeth (MAALOX/MYLANTA) 200-200-20 MG/5ML suspension Take 30 mLs by mouth every 6 (six) hours as needed for indigestion or heartburn.      Skipper Cliche SALINE NASAL NO-DRIP GEL Place into the nose 2 (two) times daily as needed (nose bleeds).      . docusate sodium (COLACE) 100 MG capsule Take 200 mg by mouth at bedtime.       . dorzolamide-timolol (COSOPT) 22.3-6.8 MG/ML ophthalmic solution Place 1 drop into the left eye every 12 (twelve) hours.      . DULoxetine (CYMBALTA) 30 MG capsule Take 30 mg by mouth 2 (two) times daily.      . fentaNYL (DURAGESIC - DOSED MCG/HR) 100 MCG/HR Apply 1 patch every 3 hours. Change site  10 patch  0  . furosemide (LASIX) 40 MG tablet Take 40 mg by mouth 2 (two) times daily.       [name]  glucagon (GLUCAGEN) 1 MG SOLR injection Inject 1 mg into the vein once as needed for low blood sugar.      [name] glucosamine-chondroitin 500-400 MG tablet Take 1 tablet by mouth 3 (three) times daily.      [name] guaifenesin (ROBITUSSIN) 100 MG/5ML syrup Take 300 mg by mouth 3 (three) times daily as needed for cough.      [name] HYDROcodone-acetaminophen (NORCO) 10-325 MG per tablet every 4 (four) hours as needed. Take 1/2 tablet by mouth every 4 hours for pain as needed.      . hydrOXYzine (ATARAX/VISTARIL) 25 MG tablet Take 25 mg by mouth at bedtime as needed. For itching      . insulin aspart (NOVOLOG) 100 UNIT/ML injection Inject 8-12 Units into the skin 3 (three)  times daily with meals. 8 units If CBG is greater than 150 at lunch and 12 units if CBG is greater than 150 at breakfast and dinner      . insulin glargine (LANTUS) 100 UNIT/ML injection Inject 16 Units into the skin at bedtime.       . isosorbide mononitrate (IMDUR) 60 MG 24 hr tablet Take 60 mg by mouth every morning.       [name] levothyroxine (SYNTHROID, LEVOTHROID) 75 MCG tablet Take 75-112.5 mcg by mouth daily. Take 1 and 1/2 tablets on Saturdays then take 1 tablet all the other days      . loratadine (CLARITIN) 10 MG tablet Take 10 mg by mouth daily.       [name] LORazepam (ATIVAN) 0.5 MG tablet Take 1 mg by mouth every 12 (twelve) hours as needed. Severe agitation      . losartan (COZAAR) 25 MG tablet Take 25 mg by mouth daily.      [name] lubiprostone (AMITIZA) 24 MCG capsule Take 24 mcg by mouth 2 (two) times daily with a meal.        . memantine (NAMENDA) 10 MG tablet Take 10 mg by mouth 2 (two) times daily.      . methylcellulose (ARTIFICIAL TEARS) 1 % ophthalmic solution Place 1 drop into both eyes 4 (four) times daily as needed (for dryness).      . Multiple Vitamins-Minerals (CERTAVITE SENIOR/ANTIOXIDANT PO) Take 1 tablet by mouth daily.      . nitroGLYCERIN (NITROSTAT) 0.4 MG SL tablet Place 0.4 mg under the tongue every 5 (five) minutes as needed. For chest pain      . Nutritional Supplements (RESOURCE 2.0 PO) Take 120 mLs by mouth 3 (three) times daily.      [name] omeprazole (PRILOSEC) 20 MG capsule Take 20 mg by mouth 2 (two) times daily.      . Probiotic Product (ALIGN) 4 MG CAPS Take 1 capsule by mouth daily.      . propranolol (INDERAL) 20 MG tablet Take 20 mg by mouth 2 (two) times daily.      [name] spironolactone (ALDACTONE) 25 MG tablet Take 25 mg by mouth daily.       . Vitamin D, Ergocalciferol, (DRISDOL) 50000 UNITS CAPS Take 50,000 Units by mouth every 30 (thirty) days. On the first of the month       No current facility-administered medications for this visit.    Allergies  Allergen  Reactions  . Penicillins Other (See Comments)    Per MAR  . Bee Venom Other (See Comments)    Per MAR  . Celebrex [Celecoxib] Other (See Comments)    Per MAR  . Lidocaine Hcl Other (See Comments)  Per MAR  . Phenobarbital Other (See Comments)    Per MAR  . Procaine Hcl Other (See Comments)    Per mar    Patient Active Problem List   Diagnosis Date Noted  . Venous stasis dermatitis of both lower extremities 12/10/2013  . Hyponatremia 11/05/2013  . HCAP (healthcare-associated pneumonia) 10/25/2013  . Candidiasis of skin 09/10/2013  . Dementia with behavioral disturbance 08/23/2013  . Right hip pain 08/13/2013  . PAF (paroxysmal atrial fibrillation) 07/26/2013  . A-fib 07/23/2013  . Edema 06/27/2013  . Wound of left leg 06/27/2013  . Ulcer of foot due to diabetes mellitus 06/27/2013  . Pressure ulcer stage II 06/18/2013  . Falls frequently 04/23/2013  . DNR (do not resuscitate) 04/23/2013  . Toe osteomyelitis, left 04/02/2013  . Type II or unspecified type diabetes mellitus with peripheral circulatory disorders, uncontrolled(250.72) 02/26/2013  . Unspecified hypothyroidism 02/26/2013  . Depression 02/26/2013  . Essential tremor 02/26/2013  . Back pain 02/26/2013  . HTN (hypertension) 02/26/2013  . Unspecified constipation 02/26/2013  . Ischemic heart disease 10/02/2012  . Leg swelling 10/02/2012  . Dyspepsia 10/02/2012  . Diabetic foot ulcer 07/15/2011  . 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 Never Smoker   Smokeless tobacco  . Not on file    History  Alcohol Use No    No family history on file.  Review of Systems: Constitutional: no fever chills diaphoresis or fatigue or change in weight.  Head and neck: no hearing loss, no epistaxis, no photophobia or visual disturbance. Respiratory: No cough, shortness of breath or wheezing. Cardiovascular: No chest pain peripheral edema, palpitations. Gastrointestinal: No  abdominal distention, no abdominal pain, no change in bowel habits hematochezia or melena. Genitourinary: No dysuria, no frequency, no urgency, no nocturia. Musculoskeletal:No arthralgias, no back pain, no gait disturbance or myalgias. Neurological: No dizziness, no headaches, no numbness, no seizures, no syncope, no weakness, no tremors. Hematologic: No lymphadenopathy, no easy bruising. Psychiatric: No confusion, no hallucinations, no sleep disturbance.    Physical Exam: Filed Vitals:   02/03/14 0932  BP: 115/64  Pulse: 78   the general appearance reveals alert elderly woman who walks with a cane.  She is in no distress.The head and neck exam reveals pupils equal and reactive.  Extraocular movements are full.  There is no scleral icterus.  The mouth and pharynx are normal.  The neck is supple.  The carotids reveal no bruits.  The jugular venous pressure is normal.  The  thyroid is not enlarged.  There is no lymphadenopathy.  The chest is clear to percussion and auscultation.  There are no rales or rhonchi.  Expansion of the chest is symmetrical.  The precordium is quiet.  The rhythm is regular with occasional PVCs.  The first heart sound is normal.  The second heart sound is physiologically split.  There is no murmur gallop rub or click.  There is no abnormal lift or heave.  The abdomen is soft and nontender.  The bowel sounds are normal.  The liver and spleen are not enlarged.  There are no abdominal masses.  There are no abdominal bruits.  Extremities reveal bilateral lower extremity edema worse on the left.  Both lower legs are covered with an Ace wrap. Strength is normal and symmetrical in all extremities.  There is no lateralizing weakness.  There are no sensory deficits.  The skin is warm and dry.  There is no rash.  EKG today shows atrial fibrillation with controlled ventricular response.  Atrial fibrillation is new since 07/26/13   Assessment / Plan: Continue same medication.  Recheck in  one year for followup office visit and EKG

## 2014-02-03 NOTE — Patient Instructions (Signed)
Your physician recommends that you continue on your current medications as directed. Please refer to the Current Medication list given to you today.  Your physician wants you to follow-up in: 1 year. You will receive a reminder letter in the mail two months in advance. If you don't receive a letter, please call our office to schedule the follow-up appointment.  

## 2014-02-03 NOTE — Assessment & Plan Note (Signed)
Denies any recurrent chest pain or angina

## 2014-02-03 NOTE — Assessment & Plan Note (Addendum)
The patient has a history of paroxysmal atrial fibrillation.  On her last visit she was in sinus rhythm.  Today she is back in atrial fibrillation.  She herself is unaware of her heart rhythm.  She has not had any TIA symptoms.  She is not a candidate for long-term anticoagulation because she is a fall risk and she has multiple comorbidities

## 2014-02-04 ENCOUNTER — Non-Acute Institutional Stay (SKILLED_NURSING_FACILITY): Payer: Medicare Other | Admitting: Nurse Practitioner

## 2014-02-04 ENCOUNTER — Encounter: Payer: Self-pay | Admitting: Nurse Practitioner

## 2014-02-04 DIAGNOSIS — N390 Urinary tract infection, site not specified: Secondary | ICD-10-CM

## 2014-02-04 DIAGNOSIS — G252 Other specified forms of tremor: Secondary | ICD-10-CM

## 2014-02-04 DIAGNOSIS — I4891 Unspecified atrial fibrillation: Secondary | ICD-10-CM

## 2014-02-04 DIAGNOSIS — K5900 Constipation, unspecified: Secondary | ICD-10-CM

## 2014-02-04 DIAGNOSIS — L8992 Pressure ulcer of unspecified site, stage 2: Secondary | ICD-10-CM

## 2014-02-04 DIAGNOSIS — L8990 Pressure ulcer of unspecified site, unspecified stage: Secondary | ICD-10-CM

## 2014-02-04 DIAGNOSIS — S91009A Unspecified open wound, unspecified ankle, initial encounter: Secondary | ICD-10-CM

## 2014-02-04 DIAGNOSIS — S81009A Unspecified open wound, unspecified knee, initial encounter: Secondary | ICD-10-CM

## 2014-02-04 DIAGNOSIS — I8310 Varicose veins of unspecified lower extremity with inflammation: Secondary | ICD-10-CM

## 2014-02-04 DIAGNOSIS — I10 Essential (primary) hypertension: Secondary | ICD-10-CM

## 2014-02-04 DIAGNOSIS — R296 Repeated falls: Secondary | ICD-10-CM

## 2014-02-04 DIAGNOSIS — F32A Depression, unspecified: Secondary | ICD-10-CM

## 2014-02-04 DIAGNOSIS — S81809A Unspecified open wound, unspecified lower leg, initial encounter: Secondary | ICD-10-CM

## 2014-02-04 DIAGNOSIS — E1159 Type 2 diabetes mellitus with other circulatory complications: Secondary | ICD-10-CM

## 2014-02-04 DIAGNOSIS — G250 Essential tremor: Secondary | ICD-10-CM

## 2014-02-04 DIAGNOSIS — F3289 Other specified depressive episodes: Secondary | ICD-10-CM

## 2014-02-04 DIAGNOSIS — E039 Hypothyroidism, unspecified: Secondary | ICD-10-CM

## 2014-02-04 DIAGNOSIS — F329 Major depressive disorder, single episode, unspecified: Secondary | ICD-10-CM

## 2014-02-04 DIAGNOSIS — F0391 Unspecified dementia with behavioral disturbance: Secondary | ICD-10-CM

## 2014-02-04 DIAGNOSIS — M549 Dorsalgia, unspecified: Secondary | ICD-10-CM

## 2014-02-04 DIAGNOSIS — F03918 Unspecified dementia, unspecified severity, with other behavioral disturbance: Secondary | ICD-10-CM

## 2014-02-04 DIAGNOSIS — R609 Edema, unspecified: Secondary | ICD-10-CM

## 2014-02-04 DIAGNOSIS — Z9181 History of falling: Secondary | ICD-10-CM

## 2014-02-04 DIAGNOSIS — S81802A Unspecified open wound, left lower leg, initial encounter: Secondary | ICD-10-CM

## 2014-02-04 DIAGNOSIS — I872 Venous insufficiency (chronic) (peripheral): Secondary | ICD-10-CM

## 2014-02-04 NOTE — Progress Notes (Signed)
Patient ID: [name], female   DOB: 1926-01-24, 80 y.o.   MRN: [id number]   Code Status: DNR  Allergies  Allergen Reactions  . Penicillins Other (See Comments)    Per MAR  . Bee Venom Other (See Comments)    Per MAR  . Celebrex [Celecoxib] Other (See Comments)    Per MAR  . Lidocaine Hcl Other (See Comments)    Per MAR  . Phenobarbital Other (See Comments)    Per MAR  . Procaine Hcl Other (See Comments)    Per mar    Chief Complaint  Patient presents with  . Medical Managment of Chronic Issues  . Acute Visit    ER for s/p fall and head trauma    HPI: Patient is a 80 y.o. female seen in the SNF at [facility] today for evaluation of UTI 01/2014, s/p ED evaluation for fall x2 01/29/14, Occipital laceration and left leg skin tear, pressure ulcer R+L buttocks, BLE edeam and other chronic medical conditions.   ED 01/29/14 for minor head injury and dementia: head CT w/o contrast: no acute findings.  Problem List Items Addressed This Visit   A-fib     Rate controlled. Propranolol was increased to 20mg  bid for better rate control in setting of HTN and essential tremor. Saw Dr. Mare Ferrari 02/03/14 doing well, EKG A-fib with controlled VR. 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 Given her hx of cardiovascular issues--vascular dementia is more likely. Tolerated  Namenda and increased Cymbalta to 30mg  bid. Mood is better controlled. Gradual decline in her cognition noted.      Depression     Better with Cymbalta 30mg  bid and newly added Namenda. Observe the patient.     Edema     Chronic venous insufficiency and dermatitis. Stable. Chronic. 2+. Continue Furosemide.        Essential tremor     Mild head titubation and hands tremor-not disabling. Takes Propranolol 10mg  bid      Falls  frequently - Primary     Fell x2 01/29/14 at 1:30 am and 3:10 am. She was found to strike head with a 3cm laceration occiput and have altered mentation. S/p ED evaluation. UTI contributed to her generalized weakness, altered mentation in setting of baseline dementia, and falling    HTN (hypertension)     Controlled, continue Losartan 25mg  daily, Atenolol, monitor blood pressure daily. Dr. Mare Ferrari 02/03/14 continue same cardiac meds.      Pressure ulcer stage II     R+L buttocks-healing nicely-easily bleed when diaper is peeled off-will have barrier cream bid to affected areas until healed. May use silver nitrate and pressure dressing prn to stop bleed.     Type II or unspecified type diabetes mellitus with peripheral circulatory disorders, uncontrolled(250.72) (Chronic)     No further hypoglycemic episodes since Lantus16 units. Continue bolus Novolog 8 units bid. Better controlled of her blood sugar: Hgb A1c 8.2 08/28/13 down to 7.5 12/05/13. CBG am 63-223, noon 140-266, pm 117-295     Unspecified constipation     Stable, takes colace 100 II qhs and Amitiza 33mcg bid and MiraLax prn.     Unspecified hypothyroidism (Chronic)     Corrected with Levothyroxine 112.23mcg, last TSH 2.069 08/26/13  Urinary tract infection, site not specified     [place] [address] x2 01/29/14, altered mentation, s/p ED evaluation, 02/03/14 Cardiology f/u. Identified UTI 01/31/14 per urine culture showed E-Coli>100,000c/ml, Cipro 500mg  po bid x 7 days started 01/31/14--her mentation has returned to her baseline today.     Venous stasis dermatitis of both lower extremities     Chronic venous insufficiency and dermatitis. Compression wrap. No s/s of infection except chronic erythema and swelling.      Wound of left leg     New abrasion obtained from fall 01/29/14-not infected. Baseline chronic venous dermatitis persisted.        Review of Systems:  Review of Systems  Constitutional: Negative for fever, chills, weight  loss, malaise/fatigue and diaphoresis.  HENT: Positive for hearing loss. Negative for congestion, ear discharge and sore throat.   Eyes: Negative for pain, discharge and redness.  Respiratory: Negative for cough, sputum production, shortness of breath and wheezing.   Cardiovascular: Positive for leg swelling and PND. Negative for chest pain, palpitations, orthopnea and claudication.  Gastrointestinal: Negative for heartburn, nausea, vomiting, abdominal pain, diarrhea, constipation and blood in stool.  Genitourinary: Positive for frequency. Negative for dysuria, urgency and flank pain.  Musculoskeletal: Positive for back pain, falls and joint pain. Negative for myalgias and neck pain.       Able to ambulate with walker. Last fall 01/29/14  Skin: Positive for rash (chronic BLE venous dermatitis. Left medial forefoot diabetic foot ulcer). Negative for itching.       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 Intergluteal cleft erythema and superficial skin missing-worse-beefy red extends to thighs/abd/lower back-healing nicely.  Left forefoot diabetic ulcer--chronic about a quarter size. Pressure ulcers at R+L buttocks-in mirror image-2x2cm and superficial-healing nicely. New skin tear LLE and laceration occiput sustained from fall 01/29/14  Neurological: Positive for tremors. Negative for dizziness, tingling, sensory change, speech change, focal weakness, seizures, loss of consciousness, weakness and headaches.  Endo/Heme/Allergies: Negative for environmental allergies and polydipsia. Does not bruise/bleed easily.  Psychiatric/Behavioral: Positive for depression and memory loss. Negative for hallucinations. The patient is nervous/anxious and has insomnia.      Past Medical History  Diagnosis Date  . Diabetes mellitus   . Hypertension   . Heart murmur   . Benign familial tremor   . Rosacea   . Diverticulitis   . Heart failure     diastolic heart failure  . TIA (transient ischemic attack) 2010  .  Arthritis   . Breast cancer     breast -rt  . CAD (coronary artery disease)     single vessel CAD per cath in 2006; scattered nonobstructive disease in the left system, left to right collaterals to the RCA which was occluded by flush shots; she has been managed medically   Past Surgical History  Procedure Laterality Date  . Mastectomy partial / lumpectomy  11/02/2000    Right, with SLN  . Mastectomy partial / lumpectomy  05/15/2008    Left  . Breast lumpectomy  01/19/2010    right  . Mastectomy  03/31/2010    Right   Social History:   reports that she has never smoked. She does not have any smokeless tobacco history on file. She reports that she does not drink alcohol or use illicit drugs.  Medications: Patient's Medications  New Prescriptions   No medications on file  Previous Medications   ALUM & MAG HYDROXIDE-SIMETH (MAALOX/MYLANTA) 200-200-20 MG/5ML SUSPENSION    Take 30 mLs by mouth every 6 (six) hours  as needed for indigestion or heartburn.   AYR SALINE NASAL NO-DRIP GEL    Place into the nose 2 (two) times daily as needed (nose bleeds).   DOCUSATE SODIUM (COLACE) 100 MG CAPSULE    Take 200 mg by mouth at bedtime.    DORZOLAMIDE-TIMOLOL (COSOPT) 22.3-6.8 MG/ML OPHTHALMIC SOLUTION    Place 1 drop into the left eye every 12 (twelve) hours.   DULOXETINE (CYMBALTA) 30 MG CAPSULE    Take 30 mg by mouth 2 (two) times daily.   FENTANYL (DURAGESIC - DOSED MCG/HR) 100 MCG/HR    Apply 1 patch every 3 hours. Change site   FUROSEMIDE (LASIX) 40 MG TABLET    Take 40 mg by mouth 2 (two) times daily.    GLUCAGON (GLUCAGEN) 1 MG SOLR INJECTION    Inject 1 mg into the vein once as needed for low blood sugar.   GLUCOSAMINE-CHONDROITIN 500-400 MG TABLET    Take 1 tablet by mouth 3 (three) times daily.   GUAIFENESIN (ROBITUSSIN) 100 MG/5ML SYRUP    Take 300 mg by mouth 3 (three) times daily as needed for cough.   HYDROCODONE-ACETAMINOPHEN (NORCO) 10-325 MG PER TABLET    every 4 (four) hours as  needed. Take 1/2 tablet by mouth every 4 hours for pain as needed.   HYDROXYZINE (ATARAX/VISTARIL) 25 MG TABLET    Take 25 mg by mouth at bedtime as needed. For itching   INSULIN ASPART (NOVOLOG) 100 UNIT/ML INJECTION    Inject 8-12 Units into the skin 3 (three) times daily with meals. 8 units If CBG is greater than 150 at lunch and 12 units if CBG is greater than 150 at breakfast and dinner   INSULIN GLARGINE (LANTUS) 100 UNIT/ML INJECTION    Inject 16 Units into the skin at bedtime.    ISOSORBIDE MONONITRATE (IMDUR) 60 MG 24 HR TABLET    Take 60 mg by mouth every morning.    LEVOTHYROXINE (SYNTHROID, LEVOTHROID) 75 MCG TABLET    Take 75-112.5 mcg by mouth daily. Take 1 and 1/2 tablets on Saturdays then take 1 tablet all the other days   LORATADINE (CLARITIN) 10 MG TABLET    Take 10 mg by mouth daily.    LORAZEPAM (ATIVAN) 0.5 MG TABLET    Take 1 mg by mouth every 12 (twelve) hours as needed. Severe agitation   LOSARTAN (COZAAR) 25 MG TABLET    Take 25 mg by mouth daily.   LUBIPROSTONE (AMITIZA) 24 MCG CAPSULE    Take 24 mcg by mouth 2 (two) times daily with a meal.     MEMANTINE (NAMENDA) 10 MG TABLET    Take 10 mg by mouth 2 (two) times daily.   METHYLCELLULOSE (ARTIFICIAL TEARS) 1 % OPHTHALMIC SOLUTION    Place 1 drop into both eyes 4 (four) times daily as needed (for dryness).   MULTIPLE VITAMINS-MINERALS (CERTAVITE SENIOR/ANTIOXIDANT PO)    Take 1 tablet by mouth daily.   NITROGLYCERIN (NITROSTAT) 0.4 MG SL TABLET    Place 0.4 mg under the tongue every 5 (five) minutes as needed. For chest pain   NUTRITIONAL SUPPLEMENTS (RESOURCE 2.0 PO)    Take 120 mLs by mouth 3 (three) times daily.   OMEPRAZOLE (PRILOSEC) 20 MG CAPSULE    Take 20 mg by mouth 2 (two) times daily.   PROBIOTIC PRODUCT (ALIGN) 4 MG CAPS    Take 1 capsule by mouth daily.   PROPRANOLOL (INDERAL) 20 MG TABLET    Take 20 mg by mouth  2 (two) times daily.   SPIRONOLACTONE (ALDACTONE) 25 MG TABLET    Take 25 mg by mouth daily.     VITAMIN D, ERGOCALCIFEROL, (DRISDOL) 50000 UNITS CAPS    Take 50,000 Units by mouth every 30 (thirty) days. On the first of the month  Modified Medications   No medications on file  Discontinued Medications   No medications on file     Physical Exam: Physical Exam  Constitutional: She appears well-developed.  HENT:  Head: Normocephalic and atraumatic.  Left Ear: External ear normal.  Eyes: Conjunctivae and EOM are normal. Pupils are equal, round, and reactive to light.  Neck: Neck supple. No JVD present. No thyromegaly present.  Cardiovascular: Normal rate and regular rhythm.   Murmur heard. Pulmonary/Chest: Effort normal. She has rales.  Bibasilar rales.   Abdominal: Soft. There is no tenderness. There is no rebound.  Musculoskeletal: She exhibits edema.  Back pain is chronic which is well controlled. BLE worsened since Lasix was reduced when her oral intake was poor.   Lymphadenopathy:    She has no cervical adenopathy.  Neurological: She is alert. No cranial nerve deficit. Coordination normal.  Skin: Skin is warm and dry. Rash noted.  Erythematous and chronic swelling in BLE. Left forefoot diabetic ulcer-larger and deeper in size-wound bed is clean with tunneling(priviously protruding center is flattened and is lower than the skin surface.  Intergluteal cleft skin breakdown with superficial skin missing and erythematous extended to the R+L 1/3 of inner buttocks--new pressure ulcers R+L buttocks near the intergluteal cleft with mirror imaging about a quarter seize stage II presently-.bleeding when peeled diaper off.   Beefy red buttocks extends to abd. thighs, lower back with satellite pattern and excoriation-improving. Left forehead hematoma and left knee bru  Psychiatric: Her mood appears anxious. Her affect is not blunt, not labile and not inappropriate. Her speech is rapid and/or pressured (occasionally ). Her speech is not delayed and not slurred. She is hyperactive. She is not  agitated, not aggressive, not slowed, not withdrawn, not actively hallucinating and not combative. Thought content is not paranoid and not delusional. She does not exhibit a depressed mood. She exhibits abnormal recent memory.  MMSE 21/30 08/2013    Filed Vitals:   02/04/14 1520  BP: 100/62  Pulse: 68  Temp: 98.2 F (36.8 C)  TempSrc: Tympanic  Resp: 16      Labs reviewed: Basic Metabolic Panel:  Recent Labs  05/27/13  07/20/13 1921  08/26/13  11/11/13 11/21/13 12/23/13  NA 137  < > 136  < >  --   < > 135* 139 139  K 4.5  < > 3.9  < >  --   < > 4.3 4.2 3.5  CL  --   --  97  --   --   --   --   --   --   CO2  --   --  30  --   --   --   --   --   --   GLUCOSE  --   --  147*  --   --   --   --   --   --   BUN 44*  < > 46*  < >  --   < > 31* 23* 30*  CREATININE 1.3*  < > 1.00  < >  --   < > 1.0 0.9 1.0  CALCIUM  --   --  9.3  --   --   --   --   --   --  TSH 3.96  --   --   --  2.07  --   --   --   --   < > = values in this interval not displayed. Liver Function Tests:  Recent Labs  07/20/13 1921 08/31/13 10/23/13 11/21/13  AST 19 20 34 17  ALT 12 13 16 9   ALKPHOS 62 82 62 67  BILITOT 0.3  --   --   --   PROT 7.0  --   --   --   ALBUMIN 2.8*  --   --   --    CBC:  Recent Labs  07/20/13 1921  10/23/13 11/21/13 12/23/13  WBC 9.3  < > 6.9 7.7 6.7  NEUTROABS 6.5  --   --   --   --   HGB 13.8  < > 15.2 13.7 13.3  HCT 39.7  < > 44 41 40  MCV 97.1  --   --   --   --   PLT 299  < > 245 289 268  < > = values in this interval not displayed.  Past Procedures:  10/22/13 CXR patchy interstitial changes left lower lung most consistent with pneumonia  11/21/13 CXR minimal cardiomegaly slightly increased without pulmonary vascular congestion or pleural effusion, no inflammatory consolidate or suspicious nodule, interval resolution of interstitial pneumonitis at the left lower lung.   01/29/14 for minor head injury and dementia: head CT w/o contrast: no acute findings.    Assessment/Plan Falls frequently Fell x2 01/29/14 at 1:30 am and 3:10 am. She was found to strike head with a 3cm laceration occiput and have altered mentation. S/p ED evaluation. UTI contributed to her generalized weakness, altered mentation in setting of baseline dementia, and falling  A-fib Rate controlled. Propranolol was increased to 20mg  bid for better rate control in setting of HTN and essential tremor. Saw Dr. Mare Ferrari 02/03/14 doing well, EKG A-fib with controlled VR. 1year office visit EKG  Urinary tract infection, site not specified [place] x2 01/29/14, altered mentation, s/p ED evaluation, 02/03/14 Cardiology f/u. Identified UTI 01/31/14 per urine culture showed E-Coli>100,000c/ml, Cipro 500mg  po bid x 7 days started 01/31/14--her mentation has returned to her baseline today.   Wound of left leg New abrasion obtained from fall 01/29/14-not infected. Baseline chronic venous dermatitis persisted.   Venous stasis dermatitis of both lower extremities Chronic venous insufficiency and dermatitis. Compression wrap. No s/s of infection except chronic erythema and swelling.    Unspecified hypothyroidism Corrected with Levothyroxine 112.94mcg, last TSH 2.069 08/26/13     Unspecified constipation Stable, takes colace 100 II qhs and Amitiza 62mcg bid and MiraLax prn.   Type II or unspecified type diabetes mellitus with peripheral circulatory disorders, uncontrolled(250.72) No further hypoglycemic episodes since Lantus16 units. Continue bolus Novolog 8 units bid. Better controlled of her blood sugar: Hgb A1c 8.2 08/28/13 down to 7.5 12/05/13. CBG am 63-223, noon 140-266, pm 117-295   Pressure ulcer stage II R+L buttocks-healing nicely-easily bleed when diaper is peeled off-will have barrier cream bid to affected areas until healed. May use silver nitrate and pressure dressing prn to stop bleed.   HTN (hypertension) Controlled, continue Losartan 25mg  daily, Atenolol, monitor blood pressure  daily. Dr. Mare Ferrari 02/03/14 continue same cardiac meds.    Essential tremor Mild head titubation and hands tremor-not disabling. Takes Propranolol 10mg  bid    Edema Chronic venous insufficiency and dermatitis. Stable. Chronic. 2+. Continue Furosemide.      Depression Better with Cymbalta 30mg  bid and newly  added Namenda. Observe the patient.   Dementia with behavioral disturbance Her MMSE 21/3010/04/2013. Given her hx of cardiovascular issues--vascular dementia is more likely. Tolerated  Namenda and increased Cymbalta to 30mg  bid. Mood is better controlled. Gradual decline in her cognition noted.    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   Goals of Care: SNF  Labs/tests ordered:  none

## 2014-02-09 DIAGNOSIS — N390 Urinary tract infection, site not specified: Secondary | ICD-10-CM | POA: Insufficient documentation

## 2014-02-09 NOTE — Assessment & Plan Note (Addendum)
Fell x2 01/29/14 at 1:30 am and 3:10 am. She was found to strike head with a 3cm laceration occiput and have altered mentation. S/p ED evaluation. UTI contributed to her generalized weakness, altered mentation in setting of baseline dementia, and falling

## 2014-02-09 NOTE — Assessment & Plan Note (Signed)
No further hypoglycemic episodes since Lantus16 units. Continue bolus Novolog 8 units bid. Better controlled of her blood sugar: Hgb A1c 8.2 08/28/13 down to 7.5 12/05/13. CBG am 63-223, noon 140-266, pm 117-295

## 2014-02-09 NOTE — Assessment & Plan Note (Signed)
Her MMSE 21/3010/04/2013. Given her hx of cardiovascular issues--vascular dementia is more likely. Tolerated  Namenda and increased Cymbalta to 30mg  bid. Mood is better controlled. Gradual decline in her cognition noted.

## 2014-02-09 NOTE — Assessment & Plan Note (Signed)
Corrected with Levothyroxine 112.20mcg, last TSH 2.069 08/26/13

## 2014-02-09 NOTE — Assessment & Plan Note (Signed)
Mild head titubation and hands tremor-not disabling. Takes Propranolol 10mg bid  

## 2014-02-09 NOTE — Assessment & Plan Note (Signed)
Chronic-coccyalgia in nature, pain is controlled with Fentanyl 172mcg/hr and able to discontinue Norco 10/325 q4hr prn

## 2014-02-09 NOTE — Assessment & Plan Note (Signed)
Chronic venous insufficiency and dermatitis. Compression wrap. No s/s of infection except chronic erythema and swelling.  

## 2014-02-09 NOTE — Assessment & Plan Note (Signed)
Stable, takes colace 100 II qhs and Amitiza 24mcg bid and MiraLax prn.   

## 2014-02-09 NOTE — Assessment & Plan Note (Signed)
Controlled, continue Losartan 25mg daily, Atenolol, monitor blood pressure daily. Dr. Brackbill 02/03/14 continue same cardiac meds.    

## 2014-02-09 NOTE — Assessment & Plan Note (Signed)
Rate controlled. Propranolol was increased to 20mg bid for better rate control in setting of HTN and essential tremor. Saw Dr. Brackbill 02/03/14 doing well, EKG A-fib with controlled VR. 1year office visit EKG  

## 2014-02-09 NOTE — Assessment & Plan Note (Signed)
New abrasion obtained from fall 01/29/14-not infected. Baseline chronic venous dermatitis persisted.

## 2014-02-09 NOTE — Assessment & Plan Note (Signed)
Better with Cymbalta 30mg bid and newly added Namenda. Observe the patient.    

## 2014-02-09 NOTE — Assessment & Plan Note (Signed)
R+L buttocks-healing nicely-easily bleed when diaper is peeled off-will have barrier cream bid to affected areas until healed. May use silver nitrate and pressure dressing prn to stop bleed.

## 2014-02-09 NOTE — Assessment & Plan Note (Signed)
Chronic venous insufficiency and dermatitis. Stable. Chronic. 2+. Continue Furosemide.

## 2014-02-09 NOTE — Assessment & Plan Note (Signed)
[place] x2 01/29/14, altered mentation, s/p ED evaluation, 02/03/14 Cardiology f/u. Identified UTI 01/31/14 per urine culture showed E-Coli>100,000c/ml, Cipro 500mg  po bid x 7 days started 01/31/14--her mentation has returned to her baseline today.

## 2014-03-04 ENCOUNTER — Encounter: Payer: Self-pay | Admitting: Nurse Practitioner

## 2014-03-04 ENCOUNTER — Non-Acute Institutional Stay (SKILLED_NURSING_FACILITY): Payer: Medicare Other | Admitting: Nurse Practitioner

## 2014-03-04 DIAGNOSIS — E1169 Type 2 diabetes mellitus with other specified complication: Secondary | ICD-10-CM

## 2014-03-04 DIAGNOSIS — F3289 Other specified depressive episodes: Secondary | ICD-10-CM

## 2014-03-04 DIAGNOSIS — I872 Venous insufficiency (chronic) (peripheral): Secondary | ICD-10-CM

## 2014-03-04 DIAGNOSIS — K5900 Constipation, unspecified: Secondary | ICD-10-CM

## 2014-03-04 DIAGNOSIS — G252 Other specified forms of tremor: Secondary | ICD-10-CM

## 2014-03-04 DIAGNOSIS — F0391 Unspecified dementia with behavioral disturbance: Secondary | ICD-10-CM

## 2014-03-04 DIAGNOSIS — I10 Essential (primary) hypertension: Secondary | ICD-10-CM

## 2014-03-04 DIAGNOSIS — E11621 Type 2 diabetes mellitus with foot ulcer: Secondary | ICD-10-CM

## 2014-03-04 DIAGNOSIS — I4891 Unspecified atrial fibrillation: Secondary | ICD-10-CM

## 2014-03-04 DIAGNOSIS — M549 Dorsalgia, unspecified: Secondary | ICD-10-CM

## 2014-03-04 DIAGNOSIS — G250 Essential tremor: Secondary | ICD-10-CM

## 2014-03-04 DIAGNOSIS — I259 Chronic ischemic heart disease, unspecified: Secondary | ICD-10-CM

## 2014-03-04 DIAGNOSIS — E039 Hypothyroidism, unspecified: Secondary | ICD-10-CM

## 2014-03-04 DIAGNOSIS — F32A Depression, unspecified: Secondary | ICD-10-CM

## 2014-03-04 DIAGNOSIS — B372 Candidiasis of skin and nail: Secondary | ICD-10-CM

## 2014-03-04 DIAGNOSIS — I8310 Varicose veins of unspecified lower extremity with inflammation: Secondary | ICD-10-CM

## 2014-03-04 DIAGNOSIS — R609 Edema, unspecified: Secondary | ICD-10-CM

## 2014-03-04 DIAGNOSIS — L97509 Non-pressure chronic ulcer of other part of unspecified foot with unspecified severity: Secondary | ICD-10-CM

## 2014-03-04 DIAGNOSIS — F329 Major depressive disorder, single episode, unspecified: Secondary | ICD-10-CM

## 2014-03-04 DIAGNOSIS — F03918 Unspecified dementia, unspecified severity, with other behavioral disturbance: Secondary | ICD-10-CM

## 2014-03-04 DIAGNOSIS — E1159 Type 2 diabetes mellitus with other circulatory complications: Secondary | ICD-10-CM

## 2014-03-04 NOTE — Assessment & Plan Note (Signed)
Better with Cymbalta 30mg bid and newly added Namenda. Observe the patient.    

## 2014-03-04 NOTE — Assessment & Plan Note (Signed)
Left forefoot-stable--non healing.

## 2014-03-04 NOTE — Assessment & Plan Note (Signed)
Corrected with Levothyroxine 112.67mcg, last TSH 2.069 08/26/13. Update TSH

## 2014-03-04 NOTE — Assessment & Plan Note (Addendum)
No further hypoglycemic episodes since Lantus16 units. Continue bolus Novolog 8 units bid and 12 units with lunch. CBGs am 69-145, noon 152-242, pm 110-234. Update Hgb A1c

## 2014-03-04 NOTE — Assessment & Plan Note (Signed)
Chronic-coccyalgia in nature, pain is controlled with Fentanyl 100mcg/hr   

## 2014-03-04 NOTE — Assessment & Plan Note (Signed)
Stable, takes colace 100 II qhs and Amitiza 24mcg bid and MiraLax prn.   

## 2014-03-04 NOTE — Assessment & Plan Note (Addendum)
Chronic venous insufficiency and dermatitis. Stable. Chronic. 2+. Continue Furosemide and Spironolactone. Update CMP

## 2014-03-04 NOTE — Assessment & Plan Note (Signed)
Her MMSE 21/3010/04/2013. Given her hx of cardiovascular issues--vascular dementia is more likely. Tolerated  Namenda and increased Cymbalta to 30mg bid. Mood is better controlled. Gradual decline in her cognition noted.   

## 2014-03-04 NOTE — Assessment & Plan Note (Signed)
Chronic venous insufficiency and dermatitis. Compression wrap. No s/s of infection except chronic erythema and swelling.  

## 2014-03-04 NOTE — Assessment & Plan Note (Signed)
Mild head titubation and hands tremor-not disabling. Takes Propranolol 10mg bid  

## 2014-03-04 NOTE — Assessment & Plan Note (Addendum)
Still had angina occasionally, f/u Cardiology, continue with Imdur 60mg  and Prn NTG(last dose 02/12/13) and ASA 81mg . Update CBC

## 2014-03-04 NOTE — Assessment & Plan Note (Signed)
Much improved

## 2014-03-04 NOTE — Assessment & Plan Note (Signed)
Controlled, continue Losartan 25mg daily, Atenolol, monitor blood pressure daily. Dr. Brackbill 02/03/14 continue same cardiac meds.    

## 2014-03-04 NOTE — Progress Notes (Signed)
Patient ID: [name], female   DOB: [birth year], 80 y.o.   MRN: [id number]   Code Status: DNR  Allergies  Allergen Reactions  . Penicillins Other (See Comments)    Per MAR  . Bee Venom Other (See Comments)    Per MAR  . Celebrex [Celecoxib] Other (See Comments)    Per MAR  . Lidocaine Hcl Other (See Comments)    Per MAR  . Phenobarbital Other (See Comments)    Per MAR  . Procaine Hcl Other (See Comments)    Per mar    Chief Complaint  Patient presents with  . Medical Management of Chronic Issues    HPI: Patient is a 80 y.o. female seen in the SNF at [facility] today for evaluation of chronic medical conditions.   ED 01/29/14 for minor head injury and dementia: head CT w/o contrast: no acute findings.  Problem List Items Addressed This Visit   A-fib - Primary     Rate controlled. Propranolol was increased to 20mg  bid for better rate control in setting of HTN and essential tremor. Saw Dr. Mare Ferrari 02/03/14 doing well, EKG A-fib with controlled VR. 1year office visit EKG     Relevant Medications      aspirin EC 81 MG tablet   Back pain     Chronic-coccyalgia in nature, pain is controlled with Fentanyl 160mcg/hr     Relevant Medications      aspirin EC 81 MG tablet   Candidiasis of skin     Much improved.     Dementia with behavioral disturbance     Her MMSE 21/3010/04/2013. Given her hx of cardiovascular issues--vascular dementia is more likely. Tolerated  Namenda and increased Cymbalta to 30mg  bid. Mood is better controlled. Gradual decline in her cognition noted.      Relevant Medications      Memantine HCl ER (NAMENDA XR) 28 MG CP24   Depression     Better with Cymbalta 30mg  bid and newly added Namenda. Observe the patient.     Diabetic foot ulcer     Left forefoot-stable--non healing.     Relevant Medications      aspirin EC 81 MG tablet   Edema     Chronic venous insufficiency and dermatitis. Stable. Chronic. 2+. Continue Furosemide and Spironolactone.  Update CMP     Essential tremor     Mild head titubation and hands tremor-not disabling. Takes Propranolol 10mg  bid      HTN (hypertension)     Controlled, continue Losartan 25mg  daily, Atenolol, monitor blood pressure daily. Dr. Mare Ferrari 02/03/14 continue same cardiac meds.       Relevant Medications      aspirin EC 81 MG tablet   Ischemic heart disease     Still had angina occasionally, f/u Cardiology, continue with Imdur 60mg  and Prn NTG(last dose 02/12/13) and ASA 81mg . Update CBC     Relevant Medications      aspirin EC 81 MG tablet   Type II or unspecified type diabetes mellitus with peripheral circulatory disorders, uncontrolled(250.72) (Chronic)     No further hypoglycemic episodes since Lantus16 units. Continue bolus Novolog 8 units bid and 12 units with lunch. CBGs am 69-145, noon 152-242, pm 110-234. Update Hgb A1c     Relevant Medications      aspirin EC 81 MG tablet   Unspecified constipation     Stable, takes colace 100 II qhs and Amitiza 66mcg bid and MiraLax prn.      Unspecified  hypothyroidism (Chronic)     Corrected with Levothyroxine 112.81mcg, last TSH 2.069 08/26/13. Update TSH      Venous stasis dermatitis of both lower extremities     Chronic venous insufficiency and dermatitis. Compression wrap. No s/s of infection except chronic erythema and swelling.          Review of Systems:  Review of Systems  Constitutional: Negative for fever, chills, weight loss, malaise/fatigue and diaphoresis.  HENT: Positive for hearing loss. Negative for congestion, ear discharge and sore throat.   Eyes: Negative for pain, discharge and redness.  Respiratory: Negative for cough, sputum production, shortness of breath and wheezing.   Cardiovascular: Positive for leg swelling and PND. Negative for chest pain, palpitations, orthopnea and claudication.  Gastrointestinal: Negative for heartburn, nausea, vomiting, abdominal pain, diarrhea, constipation and blood in  stool.  Genitourinary: Positive for frequency. Negative for dysuria, urgency and flank pain.  Musculoskeletal: Positive for back pain, falls and joint pain. Negative for myalgias and neck pain.       Able to ambulate with walker. Last fall 01/29/14  Skin: Positive for rash (chronic BLE venous dermatitis. Left medial forefoot diabetic foot ulcer). Negative for itching.       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 Intergluteal cleft erythema and beefy red. Left forefoot diabetic ulcer--chronic about a quarter size.  Neurological: Positive for tremors. Negative for dizziness, tingling, sensory change, speech change, focal weakness, seizures, loss of consciousness, weakness and headaches.  Endo/Heme/Allergies: Negative for environmental allergies and polydipsia. Does not bruise/bleed easily.  Psychiatric/Behavioral: Positive for depression and memory loss. Negative for hallucinations. The patient is nervous/anxious and has insomnia.      Past Medical History  Diagnosis Date  . Diabetes mellitus   . Hypertension   . Heart murmur   . Benign familial tremor   . Rosacea   . Diverticulitis   . Heart failure     diastolic heart failure  . TIA (transient ischemic attack) 2010  . Arthritis   . Breast cancer     breast -rt  . CAD (coronary artery disease)     single vessel CAD per cath in 2006; scattered nonobstructive disease in the left system, left to right collaterals to the RCA which was occluded by flush shots; she has been managed medically   Past Surgical History  Procedure Laterality Date  . Mastectomy partial / lumpectomy  11/02/2000    Right, with SLN  . Mastectomy partial / lumpectomy  05/15/2008    Left  . Breast lumpectomy  01/19/2010    right  . Mastectomy  03/31/2010    Right   Social History:   reports that she has never smoked. She does not have any smokeless tobacco history on file. She reports that she does not drink alcohol or use illicit drugs.  Medications: Patient's  Medications  New Prescriptions   No medications on file  Previous Medications   ALUM & MAG HYDROXIDE-SIMETH (MAALOX/MYLANTA) 200-200-20 MG/5ML SUSPENSION    Take 30 mLs by mouth every 6 (six) hours as needed for indigestion or heartburn.   ASPIRIN EC 81 MG TABLET    Take 81 mg by mouth daily.   AYR SALINE NASAL NO-DRIP GEL    Place into the nose 2 (two) times daily as needed (nose bleeds).   DOCUSATE SODIUM (COLACE) 100 MG CAPSULE    Take 200 mg by mouth at bedtime.    DORZOLAMIDE-TIMOLOL (COSOPT) 22.3-6.8 MG/ML OPHTHALMIC SOLUTION    Place 1 drop into the left  eye every 12 (twelve) hours.   DULOXETINE (CYMBALTA) 30 MG CAPSULE    Take 30 mg by mouth 2 (two) times daily.   FENTANYL (DURAGESIC - DOSED MCG/HR) 100 MCG/HR    Apply 1 patch every 3 hours. Change site   FUROSEMIDE (LASIX) 40 MG TABLET    Take 40 mg by mouth 2 (two) times daily.    GLUCAGON (GLUCAGEN) 1 MG SOLR INJECTION    Inject 1 mg into the vein once as needed for low blood sugar.   GLUCOSAMINE-CHONDROITIN 500-400 MG TABLET    Take 1 tablet by mouth 3 (three) times daily.   GUAIFENESIN (ROBITUSSIN) 100 MG/5ML SYRUP    Take 300 mg by mouth 3 (three) times daily as needed for cough.   HYDROXYZINE (ATARAX/VISTARIL) 25 MG TABLET    Take 25 mg by mouth at bedtime as needed. For itching   INSULIN ASPART (NOVOLOG) 100 UNIT/ML INJECTION    Inject 8-12 Units into the skin 3 (three) times daily with meals. 8 units If CBG is greater than 150 at lunch and 12 units if CBG is greater than 150 at breakfast and dinner   INSULIN GLARGINE (LANTUS) 100 UNIT/ML INJECTION    Inject 16 Units into the skin at bedtime.    ISOSORBIDE MONONITRATE (IMDUR) 60 MG 24 HR TABLET    Take 60 mg by mouth every morning.    LEVOTHYROXINE (SYNTHROID, LEVOTHROID) 75 MCG TABLET    Take 75-112.5 mcg by mouth daily. Take 1 and 1/2 tablets on Saturdays then take 1 tablet all the other days   LORATADINE (CLARITIN) 10 MG TABLET    Take 10 mg by mouth daily.    LORAZEPAM  (ATIVAN) 0.5 MG TABLET    Take 1 mg by mouth every 12 (twelve) hours as needed. Severe agitation   LOSARTAN (COZAAR) 25 MG TABLET    Take 25 mg by mouth daily.   LUBIPROSTONE (AMITIZA) 24 MCG CAPSULE    Take 24 mcg by mouth 2 (two) times daily with a meal.     MEMANTINE HCL ER (NAMENDA XR) 28 MG CP24    Take by mouth.   METHYLCELLULOSE (ARTIFICIAL TEARS) 1 % OPHTHALMIC SOLUTION    Place 1 drop into both eyes 4 (four) times daily as needed (for dryness).   MULTIPLE VITAMINS-MINERALS (CERTAVITE SENIOR/ANTIOXIDANT PO)    Take 1 tablet by mouth daily.   NITROGLYCERIN (NITROSTAT) 0.4 MG SL TABLET    Place 0.4 mg under the tongue every 5 (five) minutes as needed. For chest pain   NUTRITIONAL SUPPLEMENTS (RESOURCE 2.0 PO)    Take 120 mLs by mouth 3 (three) times daily.   OMEPRAZOLE (PRILOSEC) 20 MG CAPSULE    Take 20 mg by mouth 2 (two) times daily.   PROBIOTIC PRODUCT (ALIGN) 4 MG CAPS    Take 1 capsule by mouth daily.   PROPRANOLOL (INDERAL) 20 MG TABLET    Take 20 mg by mouth 2 (two) times daily.   SPIRONOLACTONE (ALDACTONE) 25 MG TABLET    Take 25 mg by mouth daily.    VITAMIN D, ERGOCALCIFEROL, (DRISDOL) 50000 UNITS CAPS    Take 50,000 Units by mouth every 30 (thirty) days. On the first of the month  Modified Medications   No medications on file  Discontinued Medications   HYDROCODONE-ACETAMINOPHEN (NORCO) 10-325 MG PER TABLET    every 4 (four) hours as needed. Take 1/2 tablet by mouth every 4 hours for pain as needed.   MEMANTINE (NAMENDA) 10 MG TABLET  Take 10 mg by mouth 2 (two) times daily.     Physical Exam: Physical Exam  Constitutional: She appears well-developed.  HENT:  Head: Normocephalic and atraumatic.  Left Ear: External ear normal.  Eyes: Conjunctivae and EOM are normal. Pupils are equal, round, and reactive to light.  Neck: Neck supple. No JVD present. No thyromegaly present.  Cardiovascular: Normal rate and regular rhythm.   Murmur heard. Pulmonary/Chest: Effort normal.  She has rales.  Bibasilar rales.   Abdominal: Soft. There is no tenderness. There is no rebound.  Musculoskeletal: She exhibits edema.  Back pain is chronic which is well controlled. BLE chronic edema.   Lymphadenopathy:    She has no cervical adenopathy.  Neurological: She is alert. No cranial nerve deficit. Coordination normal.  Skin: Skin is warm and dry. Rash noted.  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 Intergluteal cleft erythema and beefy red. Left forefoot diabetic ulcer--chronic about a quarter size.   Psychiatric: Her mood appears anxious. Her affect is not blunt, not labile and not inappropriate. Her speech is rapid and/or pressured (occasionally ). Her speech is not delayed and not slurred. She is hyperactive. She is not agitated, not aggressive, not slowed, not withdrawn, not actively hallucinating and not combative. Thought content is not paranoid and not delusional. She does not exhibit a depressed mood. She exhibits abnormal recent memory.  MMSE 21/30 08/2013    Filed Vitals:   03/04/14 1222  BP: 150/90  Pulse: 89  Temp: 98 F (36.7 C)  TempSrc: Tympanic  Resp: 20      Labs reviewed: Basic Metabolic Panel:  Recent Labs  05/27/13  07/20/13 1921  08/26/13  11/11/13 11/21/13 12/23/13  NA 137  < > 136  < >  --   < > 135* 139 139  K 4.5  < > 3.9  < >  --   < > 4.3 4.2 3.5  CL  --   --  97  --   --   --   --   --   --   CO2  --   --  30  --   --   --   --   --   --   GLUCOSE  --   --  147*  --   --   --   --   --   --   BUN 44*  < > 46*  < >  --   < > 31* 23* 30*  CREATININE 1.3*  < > 1.00  < >  --   < > 1.0 0.9 1.0  CALCIUM  --   --  9.3  --   --   --   --   --   --   TSH 3.96  --   --   --  2.07  --   --   --   --   < > = values in this interval not displayed. Liver Function Tests:  Recent Labs  07/20/13 1921 08/31/13 10/23/13 11/21/13  AST 19 20 34 17  ALT 12 13 16 9   ALKPHOS 62 82 62 67  BILITOT 0.3  --   --   --   PROT 7.0  --   --   --   ALBUMIN 2.8*   --   --   --    CBC:  Recent Labs  07/20/13 1921  10/23/13 11/21/13 12/23/13  WBC 9.3  < > 6.9 7.7 6.7  NEUTROABS 6.5  --   --   --   --  HGB 13.8  < > 15.2 13.7 13.3  HCT 39.7  < > 44 41 40  MCV 97.1  --   --   --   --   PLT 299  < > 245 289 268  < > = values in this interval not displayed.  Past Procedures:  10/22/13 CXR patchy interstitial changes left lower lung most consistent with pneumonia  11/21/13 CXR minimal cardiomegaly slightly increased without pulmonary vascular congestion or pleural effusion, no inflammatory consolidate or suspicious nodule, interval resolution of interstitial pneumonitis at the left lower lung.   01/29/14 for minor head injury and dementia: head CT w/o contrast: no acute findings.   Assessment/Plan A-fib Rate controlled. Propranolol was increased to 20mg  bid for better rate control in setting of HTN and essential tremor. Saw Dr. Mare Ferrari 02/03/14 doing well, EKG A-fib with controlled VR. 1year office visit EKG   Back pain Chronic-coccyalgia in nature, pain is controlled with Fentanyl 170mcg/hr   Candidiasis of skin Much improved.   Dementia with behavioral disturbance Her MMSE 21/3010/04/2013. Given her hx of cardiovascular issues--vascular dementia is more likely. Tolerated  Namenda and increased Cymbalta to 30mg  bid. Mood is better controlled. Gradual decline in her cognition noted.    Depression Better with Cymbalta 30mg  bid and newly added Namenda. Observe the patient.   Diabetic foot ulcer Left forefoot-stable--non healing.   Edema Chronic venous insufficiency and dermatitis. Stable. Chronic. 2+. Continue Furosemide and Spironolactone. Update CMP   Essential tremor Mild head titubation and hands tremor-not disabling. Takes Propranolol 10mg  bid    HTN (hypertension) Controlled, continue Losartan 25mg  daily, Atenolol, monitor blood pressure daily. Dr. Mare Ferrari 02/03/14 continue same cardiac meds.     Ischemic heart  disease Still had angina occasionally, f/u Cardiology, continue with Imdur 60mg  and Prn NTG(last dose 02/12/13) and ASA 81mg . Update CBC   Type II or unspecified type diabetes mellitus with peripheral circulatory disorders, uncontrolled(250.72) No further hypoglycemic episodes since Lantus16 units. Continue bolus Novolog 8 units bid and 12 units with lunch. CBGs am 69-145, noon 152-242, pm 110-234. Update Hgb A1c   Unspecified hypothyroidism Corrected with Levothyroxine 112.52mcg, last TSH 2.069 08/26/13. Update TSH    Unspecified constipation Stable, takes colace 100 II qhs and Amitiza 57mcg bid and MiraLax prn.    Venous stasis dermatitis of both lower extremities Chronic venous insufficiency and dermatitis. Compression wrap. No s/s of infection except chronic erythema and swelling.       Family/ Staff Communication: observe the patient.   Goals of Care: SNF  Labs/tests ordered: CBC, CMP, TSH, Hgb A1c.

## 2014-03-04 NOTE — Assessment & Plan Note (Signed)
Rate controlled. Propranolol was increased to 20mg bid for better rate control in setting of HTN and essential tremor. Saw Dr. Brackbill 02/03/14 doing well, EKG A-fib with controlled VR. 1year office visit EKG  

## 2014-03-06 LAB — CBC AND DIFFERENTIAL
HCT: 41 % (ref 36–46)
Hemoglobin: 13.4 g/dL (ref 12.0–16.0)
Platelets: 214 10*3/uL (ref 150–399)
WBC: 5.8 10^3/mL

## 2014-03-06 LAB — BASIC METABOLIC PANEL
BUN: 33 mg/dL — AB (ref 4–21)
CREATININE: 0.9 mg/dL (ref 0.5–1.1)
GLUCOSE: 163 mg/dL
POTASSIUM: 4.6 mmol/L (ref 3.4–5.3)
Sodium: 138 mmol/L (ref 137–147)

## 2014-03-06 LAB — HEPATIC FUNCTION PANEL
ALT: 8 U/L (ref 7–35)
AST: 20 U/L (ref 13–35)
Alkaline Phosphatase: 85 U/L (ref 25–125)
Bilirubin, Total: 0.3 mg/dL

## 2014-03-06 LAB — TSH: TSH: 4.31 u[IU]/mL (ref 0.41–5.90)

## 2014-03-06 LAB — HEMOGLOBIN A1C: Hgb A1c MFr Bld: 7.8 % — AB (ref 4.0–6.0)

## 2014-03-07 ENCOUNTER — Non-Acute Institutional Stay (SKILLED_NURSING_FACILITY): Payer: Medicare Other | Admitting: Nurse Practitioner

## 2014-03-07 ENCOUNTER — Encounter: Payer: Self-pay | Admitting: Nurse Practitioner

## 2014-03-07 DIAGNOSIS — K5900 Constipation, unspecified: Secondary | ICD-10-CM

## 2014-03-07 DIAGNOSIS — F32A Depression, unspecified: Secondary | ICD-10-CM

## 2014-03-07 DIAGNOSIS — I8310 Varicose veins of unspecified lower extremity with inflammation: Secondary | ICD-10-CM

## 2014-03-07 DIAGNOSIS — G252 Other specified forms of tremor: Secondary | ICD-10-CM

## 2014-03-07 DIAGNOSIS — F03918 Unspecified dementia, unspecified severity, with other behavioral disturbance: Secondary | ICD-10-CM

## 2014-03-07 DIAGNOSIS — F3289 Other specified depressive episodes: Secondary | ICD-10-CM

## 2014-03-07 DIAGNOSIS — I480 Paroxysmal atrial fibrillation: Secondary | ICD-10-CM

## 2014-03-07 DIAGNOSIS — L97509 Non-pressure chronic ulcer of other part of unspecified foot with unspecified severity: Secondary | ICD-10-CM

## 2014-03-07 DIAGNOSIS — E11621 Type 2 diabetes mellitus with foot ulcer: Secondary | ICD-10-CM

## 2014-03-07 DIAGNOSIS — G250 Essential tremor: Secondary | ICD-10-CM

## 2014-03-07 DIAGNOSIS — I872 Venous insufficiency (chronic) (peripheral): Secondary | ICD-10-CM

## 2014-03-07 DIAGNOSIS — I10 Essential (primary) hypertension: Secondary | ICD-10-CM

## 2014-03-07 DIAGNOSIS — E039 Hypothyroidism, unspecified: Secondary | ICD-10-CM

## 2014-03-07 DIAGNOSIS — I4891 Unspecified atrial fibrillation: Secondary | ICD-10-CM

## 2014-03-07 DIAGNOSIS — F0391 Unspecified dementia with behavioral disturbance: Secondary | ICD-10-CM

## 2014-03-07 DIAGNOSIS — E1169 Type 2 diabetes mellitus with other specified complication: Secondary | ICD-10-CM

## 2014-03-07 DIAGNOSIS — I259 Chronic ischemic heart disease, unspecified: Secondary | ICD-10-CM

## 2014-03-07 DIAGNOSIS — R296 Repeated falls: Secondary | ICD-10-CM

## 2014-03-07 DIAGNOSIS — R609 Edema, unspecified: Secondary | ICD-10-CM

## 2014-03-07 DIAGNOSIS — Z9181 History of falling: Secondary | ICD-10-CM

## 2014-03-07 DIAGNOSIS — M549 Dorsalgia, unspecified: Secondary | ICD-10-CM

## 2014-03-07 DIAGNOSIS — F329 Major depressive disorder, single episode, unspecified: Secondary | ICD-10-CM

## 2014-03-07 DIAGNOSIS — E1159 Type 2 diabetes mellitus with other circulatory complications: Secondary | ICD-10-CM

## 2014-03-07 NOTE — Assessment & Plan Note (Signed)
Chronic venous insufficiency and dermatitis. Stable. Chronic. 2+. Continue Furosemide and Spironolactone. Bun/creat 33/0.90 03/06/14

## 2014-03-07 NOTE — Assessment & Plan Note (Signed)
Rate controlled. Propranolol was increased to 20mg bid for better rate control in setting of HTN and essential tremor. Saw Dr. Brackbill 02/03/14 doing well, EKG A-fib with controlled VR. 1year office visit EKG  

## 2014-03-07 NOTE — Assessment & Plan Note (Signed)
Her MMSE 21/3010/04/2013. Given her hx of cardiovascular issues--vascular dementia is more likely. Tolerated  Namenda and increased Cymbalta to 30mg bid. Mood is better controlled. Gradual decline in her cognition noted. Lack of safety awareness contributed to her frequent falling.    

## 2014-03-07 NOTE — Progress Notes (Signed)
Patient ID: ERINN MENDOSA, female   DOB: 12-04-1925, 80 y.o.   MRN: [id number]   Code Status: DNR  Allergies  Allergen Reactions  . Penicillins Other (See Comments)    Per MAR  . Bee Venom Other (See Comments)    Per MAR  . Celebrex [Celecoxib] Other (See Comments)    Per MAR  . Lidocaine Hcl Other (See Comments)    Per MAR  . Phenobarbital Other (See Comments)    Per MAR  . Procaine Hcl Other (See Comments)    Per mar    Chief Complaint  Patient presents with  . Medical Management of Chronic Issues  . Acute Visit    s/p fall.     HPI: Patient is a 80 y.o. female seen in the SNF at [facility] today for evaluation of s/p fall 03/05/14 and chronic medical conditions.   ED 01/29/14 for minor head injury and dementia: head CT w/o contrast: no acute findings.  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 Given her hx of cardiovascular issues--vascular dementia is more likely. Tolerated  Namenda and increased Cymbalta to 30mg  bid. Mood is better controlled. Gradual decline in her cognition noted. Lack of safety awareness contributed to her frequent falling.       Depression     Better with Cymbalta 30mg  bid and newly added Namenda. Observe the patient.      Edema     Chronic venous insufficiency and dermatitis. Stable. Chronic. 2+. Continue Furosemide and Spironolactone. Bun/creat 33/0.90 03/06/14    Essential tremor     Mild head titubation and hands tremor-not disabling. Takes Propranolol 10mg  bid      Falls frequently - Primary     Last fall 03/05/14 lost balance and bumped head on the hand rail with resultant of a knot @ back of her head. No focal neurological deficit.     HTN (hypertension)     Controlled, continue Losartan 25mg  daily, Atenolol, monitor blood pressure daily. Dr. Mare Ferrari 02/03/14 continue same  cardiac meds.       Ischemic heart disease     Still had angina occasionally, f/u Cardiology, continue with Imdur 60mg  and Prn NTG(last dose 02/12/13) and ASA 81mg .       PAF (paroxysmal atrial fibrillation)     Rate controlled. Propranolol was increased to 20mg  bid for better rate control in setting of HTN and essential tremor. Saw Dr. Mare Ferrari 02/03/14 doing well, EKG A-fib with controlled VR. 1year office visit EKG      Type II or unspecified type diabetes mellitus with peripheral circulatory disorders, uncontrolled(250.72) (Chronic)     No further hypoglycemic episodes since Lantus16 units. Continue bolus Novolog 8 units bid and 12 units with lunch. CBGs am 69-145, noon 152-242, pm 110-234. Hgb A1c 7.8 03/06/14     Ulcer of foot due to diabetes mellitus     Left forefoot-stable--non healing.      Unspecified constipation     Stable, takes colace 100 II qhs and Amitiza 84mcg bid and MiraLax prn.      Unspecified hypothyroidism (Chronic)     Corrected with Levothyroxine 112.17mcg, last TSH 2.069 08/26/13 and 4.306 03/06/14    Venous stasis dermatitis of both lower extremities     Chronic venous insufficiency and dermatitis. Compression wrap. No s/s of infection  except chronic erythema and swelling.        Review of Systems:  Review of Systems  Constitutional: Negative for fever, chills, weight loss, malaise/fatigue and diaphoresis.  HENT: Positive for hearing loss. Negative for congestion, ear discharge and sore throat.   Eyes: Negative for pain, discharge and redness.  Respiratory: Negative for cough, sputum production, shortness of breath and wheezing.   Cardiovascular: Positive for leg swelling and PND. Negative for chest pain, palpitations, orthopnea and claudication.  Gastrointestinal: Negative for heartburn, nausea, vomiting, abdominal pain, diarrhea, constipation and blood in stool.  Genitourinary: Positive for frequency. Negative for dysuria, urgency and flank pain.    Musculoskeletal: Positive for back pain, falls and joint pain. Negative for myalgias and neck pain.       Able to ambulate with walker. Last fall 03/05/14  Skin: Positive for rash (chronic BLE venous dermatitis. Left medial forefoot diabetic foot ulcer). Negative for itching.       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 Intergluteal cleft erythema and beefy red. Left forefoot diabetic ulcer--chronic about a quarter size.  Neurological: Positive for tremors. Negative for dizziness, tingling, sensory change, speech change, focal weakness, seizures, loss of consciousness, weakness and headaches.  Endo/Heme/Allergies: Negative for environmental allergies and polydipsia. Does not bruise/bleed easily.  Psychiatric/Behavioral: Positive for depression and memory loss. Negative for hallucinations. The patient is nervous/anxious and has insomnia.      Past Medical History  Diagnosis Date  . Diabetes mellitus   . Hypertension   . Heart murmur   . Benign familial tremor   . Rosacea   . Diverticulitis   . Heart failure     diastolic heart failure  . TIA (transient ischemic attack) 2010  . Arthritis   . Breast cancer     breast -rt  . CAD (coronary artery disease)     single vessel CAD per cath in 2006; scattered nonobstructive disease in the left system, left to right collaterals to the RCA which was occluded by flush shots; she has been managed medically   Past Surgical History  Procedure Laterality Date  . Mastectomy partial / lumpectomy  11/02/2000    Right, with SLN  . Mastectomy partial / lumpectomy  05/15/2008    Left  . Breast lumpectomy  01/19/2010    right  . Mastectomy  03/31/2010    Right   Social History:   reports that she has never smoked. She does not have any smokeless tobacco history on file. She reports that she does not drink alcohol or use illicit drugs.  Medications: Patient's Medications  New Prescriptions   No medications on file  Previous Medications   ALUM & MAG  HYDROXIDE-SIMETH (MAALOX/MYLANTA) 200-200-20 MG/5ML SUSPENSION    Take 30 mLs by mouth every 6 (six) hours as needed for indigestion or heartburn.   ASPIRIN EC 81 MG TABLET    Take 81 mg by mouth daily.   AYR SALINE NASAL NO-DRIP GEL    Place into the nose 2 (two) times daily as needed (nose bleeds).   DOCUSATE SODIUM (COLACE) 100 MG CAPSULE    Take 200 mg by mouth at bedtime.    DORZOLAMIDE-TIMOLOL (COSOPT) 22.3-6.8 MG/ML OPHTHALMIC SOLUTION    Place 1 drop into the left eye every 12 (twelve) hours.   DULOXETINE (CYMBALTA) 30 MG CAPSULE    Take 30 mg by mouth 2 (two) times daily.   FENTANYL (DURAGESIC - DOSED MCG/HR) 100 MCG/HR    Apply 1 patch every 3 hours. Change site  FUROSEMIDE (LASIX) 40 MG TABLET    Take 40 mg by mouth 2 (two) times daily.    GLUCAGON (GLUCAGEN) 1 MG SOLR INJECTION    Inject 1 mg into the vein once as needed for low blood sugar.   GLUCOSAMINE-CHONDROITIN 500-400 MG TABLET    Take 1 tablet by mouth 3 (three) times daily.   GUAIFENESIN (ROBITUSSIN) 100 MG/5ML SYRUP    Take 300 mg by mouth 3 (three) times daily as needed for cough.   HYDROXYZINE (ATARAX/VISTARIL) 25 MG TABLET    Take 25 mg by mouth at bedtime as needed. For itching   INSULIN ASPART (NOVOLOG) 100 UNIT/ML INJECTION    Inject 8-12 Units into the skin 3 (three) times daily with meals. 8 units If CBG is greater than 150 at lunch and 12 units if CBG is greater than 150 at breakfast and dinner   INSULIN GLARGINE (LANTUS) 100 UNIT/ML INJECTION    Inject 16 Units into the skin at bedtime.    ISOSORBIDE MONONITRATE (IMDUR) 60 MG 24 HR TABLET    Take 60 mg by mouth every morning.    LEVOTHYROXINE (SYNTHROID, LEVOTHROID) 75 MCG TABLET    Take 75-112.5 mcg by mouth daily. Take 1 and 1/2 tablets on Saturdays then take 1 tablet all the other days   LORATADINE (CLARITIN) 10 MG TABLET    Take 10 mg by mouth daily.    LORAZEPAM (ATIVAN) 0.5 MG TABLET    Take 1 mg by mouth every 12 (twelve) hours as needed. Severe agitation    LOSARTAN (COZAAR) 25 MG TABLET    Take 25 mg by mouth daily.   LUBIPROSTONE (AMITIZA) 24 MCG CAPSULE    Take 24 mcg by mouth 2 (two) times daily with a meal.     MEMANTINE HCL ER (NAMENDA XR) 28 MG CP24    Take by mouth.   METHYLCELLULOSE (ARTIFICIAL TEARS) 1 % OPHTHALMIC SOLUTION    Place 1 drop into both eyes 4 (four) times daily as needed (for dryness).   MULTIPLE VITAMINS-MINERALS (CERTAVITE SENIOR/ANTIOXIDANT PO)    Take 1 tablet by mouth daily.   NITROGLYCERIN (NITROSTAT) 0.4 MG SL TABLET    Place 0.4 mg under the tongue every 5 (five) minutes as needed. For chest pain   NUTRITIONAL SUPPLEMENTS (RESOURCE 2.0 PO)    Take 120 mLs by mouth 3 (three) times daily.   OMEPRAZOLE (PRILOSEC) 20 MG CAPSULE    Take 20 mg by mouth 2 (two) times daily.   PROBIOTIC PRODUCT (ALIGN) 4 MG CAPS    Take 1 capsule by mouth daily.   PROPRANOLOL (INDERAL) 20 MG TABLET    Take 20 mg by mouth 2 (two) times daily.   SPIRONOLACTONE (ALDACTONE) 25 MG TABLET    Take 25 mg by mouth daily.    VITAMIN D, ERGOCALCIFEROL, (DRISDOL) 50000 UNITS CAPS    Take 50,000 Units by mouth every 30 (thirty) days. On the first of the month  Modified Medications   No medications on file  Discontinued Medications   No medications on file     Physical Exam: Physical Exam  Constitutional: She appears well-developed.  HENT:  Head: Normocephalic and atraumatic.  Left Ear: External ear normal.  Eyes: Conjunctivae and EOM are normal. Pupils are equal, round, and reactive to light.  Neck: Neck supple. No JVD present. No thyromegaly present.  Cardiovascular: Normal rate and regular rhythm.   Murmur heard. Pulmonary/Chest: Effort normal. She has rales.  Bibasilar rales.   Abdominal: Soft. There  is no tenderness. There is no rebound.  Musculoskeletal: She exhibits edema.  Back pain is chronic which is well controlled. BLE chronic edema.   Lymphadenopathy:    She has no cervical adenopathy.  Neurological: She is alert. No cranial  nerve deficit. Coordination normal.  Skin: Skin is warm and dry. Rash noted.  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 Intergluteal cleft erythema and beefy red. Left forefoot diabetic ulcer--chronic about a quarter size. Occipital hematoma. Right mastectomy   Psychiatric: Her mood appears anxious. Her affect is not blunt, not labile and not inappropriate. Her speech is rapid and/or pressured (occasionally ). Her speech is not delayed and not slurred. She is hyperactive. She is not agitated, not aggressive, not slowed, not withdrawn, not actively hallucinating and not combative. Thought content is not paranoid and not delusional. She does not exhibit a depressed mood. She exhibits abnormal recent memory.  MMSE 21/30 08/2013    Filed Vitals:   03/07/14 1348  BP: 140/80  Pulse: 76  Temp: 97 F (36.1 C)  TempSrc: Tympanic  Resp: 18      Labs reviewed: Basic Metabolic Panel:  Recent Labs  05/27/13  07/20/13 1921  08/26/13  11/21/13 12/23/13 03/06/14  NA 137  < > 136  < >  --   < > 139 139 138  K 4.5  < > 3.9  < >  --   < > 4.2 3.5 4.6  CL  --   --  97  --   --   --   --   --   --   CO2  --   --  30  --   --   --   --   --   --   GLUCOSE  --   --  147*  --   --   --   --   --   --   BUN 44*  < > 46*  < >  --   < > 23* 30* 33*  CREATININE 1.3*  < > 1.00  < >  --   < > 0.9 1.0 0.9  CALCIUM  --   --  9.3  --   --   --   --   --   --   TSH 3.96  --   --   --  2.07  --   --   --  4.31  < > = values in this interval not displayed. Liver Function Tests:  Recent Labs  07/20/13 1921  10/23/13 11/21/13 03/06/14  AST 19  < > 34 17 20  ALT 12  < > 16 9 8   ALKPHOS 62  < > 62 67 85  BILITOT 0.3  --   --   --   --   PROT 7.0  --   --   --   --   ALBUMIN 2.8*  --   --   --   --   < > = values in this interval not displayed. CBC:  Recent Labs  07/20/13 1921  11/21/13 12/23/13 03/06/14  WBC 9.3  < > 7.7 6.7 5.8  NEUTROABS 6.5  --   --   --   --   HGB 13.8  < > 13.7 13.3 13.4  HCT 39.7  < >  41 40 41  MCV 97.1  --   --   --   --   PLT 299  < > 289 268 214  < > =  values in this interval not displayed.  Past Procedures:  10/22/13 CXR patchy interstitial changes left lower lung most consistent with pneumonia  11/21/13 CXR minimal cardiomegaly slightly increased without pulmonary vascular congestion or pleural effusion, no inflammatory consolidate or suspicious nodule, interval resolution of interstitial pneumonitis at the left lower lung.   01/29/14 for minor head injury and dementia: head CT w/o contrast: no acute findings.   Assessment/Plan Falls frequently Last fall 03/05/14 lost balance and bumped head on the hand rail with resultant of a knot @ back of her head. No focal neurological deficit.   Edema Chronic venous insufficiency and dermatitis. Stable. Chronic. 2+. Continue Furosemide and Spironolactone. Bun/creat 33/0.90 03/06/14  HTN (hypertension) Controlled, continue Losartan 25mg  daily, Atenolol, monitor blood pressure daily. Dr. Mare Ferrari 02/03/14 continue same cardiac meds.     Venous stasis dermatitis of both lower extremities Chronic venous insufficiency and dermatitis. Compression wrap. No s/s of infection except chronic erythema and swelling.   Unspecified hypothyroidism Corrected with Levothyroxine 112.59mcg, last TSH 2.069 08/26/13 and 4.306 03/06/14  Unspecified constipation Stable, takes colace 100 II qhs and Amitiza 2mcg bid and MiraLax prn.    Ulcer of foot due to diabetes mellitus Left forefoot-stable--non healing.    Type II or unspecified type diabetes mellitus with peripheral circulatory disorders, uncontrolled(250.72) No further hypoglycemic episodes since Lantus16 units. Continue bolus Novolog 8 units bid and 12 units with lunch. CBGs am 69-145, noon 152-242, pm 110-234. Hgb A1c 7.8 03/06/14   PAF (paroxysmal atrial fibrillation) Rate controlled. Propranolol was increased to 20mg  bid for better rate control in setting of HTN and essential  tremor. Saw Dr. Mare Ferrari 02/03/14 doing well, EKG A-fib with controlled VR. 1year office visit EKG    Ischemic heart disease Still had angina occasionally, f/u Cardiology, continue with Imdur 60mg  and Prn NTG(last dose 02/12/13) and ASA 81mg .     Essential tremor Mild head titubation and hands tremor-not disabling. Takes Propranolol 10mg  bid    Depression Better with Cymbalta 30mg  bid and newly added Namenda. Observe the patient.    Dementia with behavioral disturbance Her MMSE 21/3010/04/2013. Given her hx of cardiovascular issues--vascular dementia is more likely. Tolerated  Namenda and increased Cymbalta to 30mg  bid. Mood is better controlled. Gradual decline in her cognition noted. Lack of safety awareness contributed to her frequent falling.     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   Goals of Care: SNF  Labs/tests ordered: none

## 2014-03-07 NOTE — Assessment & Plan Note (Signed)
Mild head titubation and hands tremor-not disabling. Takes Propranolol 10mg bid  

## 2014-03-07 NOTE — Assessment & Plan Note (Signed)
Corrected with Levothyroxine 112.5mcg, last TSH 2.069 08/26/13 and 4.306 03/06/14   

## 2014-03-07 NOTE — Assessment & Plan Note (Signed)
Better with Cymbalta 30mg bid and newly added Namenda. Observe the patient.    

## 2014-03-07 NOTE — Assessment & Plan Note (Signed)
Still had angina occasionally, f/u Cardiology, continue with Imdur 60mg and Prn NTG(last dose 02/12/13) and ASA 81mg.     

## 2014-03-07 NOTE — Assessment & Plan Note (Signed)
Stable, takes colace 100 II qhs and Amitiza 24mcg bid and MiraLax prn.   

## 2014-03-07 NOTE — Assessment & Plan Note (Signed)
Last fall 03/05/14 lost balance and bumped head on the hand rail with resultant of a knot @ back of her head. No focal neurological deficit.

## 2014-03-07 NOTE — Assessment & Plan Note (Signed)
Left forefoot-stable--non healing.  

## 2014-03-07 NOTE — Assessment & Plan Note (Signed)
Chronic venous insufficiency and dermatitis. Compression wrap. No s/s of infection except chronic erythema and swelling.

## 2014-03-07 NOTE — Assessment & Plan Note (Signed)
No further hypoglycemic episodes since Lantus16 units. Continue bolus Novolog 8 units bid and 12 units with lunch. CBGs am 69-145, noon 152-242, pm 110-234. Hgb A1c 7.8 03/06/14   

## 2014-03-07 NOTE — Assessment & Plan Note (Signed)
Controlled, continue Losartan 25mg daily, Atenolol, monitor blood pressure daily. Dr. Brackbill 02/03/14 continue same cardiac meds.    

## 2014-03-07 NOTE — Assessment & Plan Note (Signed)
Chronic-coccyalgia in nature, pain is controlled with Fentanyl 100mcg/hr   

## 2014-03-21 ENCOUNTER — Encounter: Payer: Self-pay | Admitting: Nurse Practitioner

## 2014-03-21 ENCOUNTER — Non-Acute Institutional Stay (SKILLED_NURSING_FACILITY): Payer: Medicare Other

## 2014-03-21 DIAGNOSIS — E039 Hypothyroidism, unspecified: Secondary | ICD-10-CM

## 2014-03-21 DIAGNOSIS — I4891 Unspecified atrial fibrillation: Secondary | ICD-10-CM

## 2014-03-21 DIAGNOSIS — I10 Essential (primary) hypertension: Secondary | ICD-10-CM

## 2014-03-21 DIAGNOSIS — L8990 Pressure ulcer of unspecified site, unspecified stage: Secondary | ICD-10-CM

## 2014-03-21 DIAGNOSIS — F03918 Unspecified dementia, unspecified severity, with other behavioral disturbance: Secondary | ICD-10-CM

## 2014-03-21 DIAGNOSIS — I480 Paroxysmal atrial fibrillation: Secondary | ICD-10-CM

## 2014-03-21 DIAGNOSIS — F0391 Unspecified dementia with behavioral disturbance: Secondary | ICD-10-CM

## 2014-03-21 DIAGNOSIS — E1169 Type 2 diabetes mellitus with other specified complication: Secondary | ICD-10-CM

## 2014-03-21 DIAGNOSIS — L8992 Pressure ulcer of unspecified site, stage 2: Secondary | ICD-10-CM

## 2014-03-21 DIAGNOSIS — M549 Dorsalgia, unspecified: Secondary | ICD-10-CM

## 2014-03-21 DIAGNOSIS — I8310 Varicose veins of unspecified lower extremity with inflammation: Secondary | ICD-10-CM

## 2014-03-21 DIAGNOSIS — R609 Edema, unspecified: Secondary | ICD-10-CM

## 2014-03-21 DIAGNOSIS — E11621 Type 2 diabetes mellitus with foot ulcer: Secondary | ICD-10-CM

## 2014-03-21 DIAGNOSIS — E1159 Type 2 diabetes mellitus with other circulatory complications: Secondary | ICD-10-CM

## 2014-03-21 DIAGNOSIS — G252 Other specified forms of tremor: Secondary | ICD-10-CM

## 2014-03-21 DIAGNOSIS — F32A Depression, unspecified: Secondary | ICD-10-CM

## 2014-03-21 DIAGNOSIS — I872 Venous insufficiency (chronic) (peripheral): Secondary | ICD-10-CM

## 2014-03-21 DIAGNOSIS — L97509 Non-pressure chronic ulcer of other part of unspecified foot with unspecified severity: Secondary | ICD-10-CM

## 2014-03-21 DIAGNOSIS — K5900 Constipation, unspecified: Secondary | ICD-10-CM

## 2014-03-21 DIAGNOSIS — F3289 Other specified depressive episodes: Secondary | ICD-10-CM

## 2014-03-21 DIAGNOSIS — F329 Major depressive disorder, single episode, unspecified: Secondary | ICD-10-CM

## 2014-03-21 DIAGNOSIS — G250 Essential tremor: Secondary | ICD-10-CM

## 2014-03-21 NOTE — Assessment & Plan Note (Signed)
No further hypoglycemic episodes since Lantus16 units. Continue bolus Novolog 8 units bid and 12 units with lunch. CBGs am 69-145, noon 152-242, pm 110-234. Hgb A1c 7.8 03/06/14

## 2014-03-21 NOTE — Progress Notes (Signed)
Patient ID: [name], female   DOB: [birth year], 80 y.o.   MRN: [id number]   Code Status: DNR  Allergies  Allergen Reactions  . Penicillins Other (See Comments)    Per MAR  . Bee Venom Other (See Comments)    Per MAR  . Celebrex [Celecoxib] Other (See Comments)    Per MAR  . Lidocaine Hcl Other (See Comments)    Per MAR  . Phenobarbital Other (See Comments)    Per MAR  . Procaine Hcl Other (See Comments)    Per mar    No chief complaint on file.   HPI: Patient is a 80 y.o. female seen in the SNF at [facility] today for evaluation of chronic medical conditions.     ED 01/29/14 for minor head injury and dementia: head CT w/o contrast: no acute findings.  Problem List Items Addressed This Visit   None      Review of Systems:  Review of Systems  Constitutional: Negative for fever, chills, weight loss, malaise/fatigue and diaphoresis.  HENT: Positive for hearing loss. Negative for congestion, ear discharge and sore throat.   Eyes: Negative for pain, discharge and redness.  Respiratory: Negative for cough, sputum production, shortness of breath and wheezing.   Cardiovascular: Positive for leg swelling and PND. Negative for chest pain, palpitations, orthopnea and claudication.       1+  Gastrointestinal: Negative for heartburn, nausea, vomiting, abdominal pain, diarrhea, constipation and blood in stool.  Genitourinary: Positive for frequency. Negative for dysuria, urgency and flank pain.  Musculoskeletal: Positive for back pain, falls and joint pain. Negative for myalgias and neck pain.       Able to ambulate with walker. Last fall 03/05/14  Skin: Positive for rash (chronic BLE venous dermatitis. Left medial forefoot diabetic foot ulcer). Negative for itching.       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 Intergluteal cleft erythema and beefy red. Left forefoot diabetic ulcer--chronic about a quarter size.  Neurological: Positive for tremors. Negative for dizziness, tingling,  sensory change, speech change, focal weakness, seizures, loss of consciousness, weakness and headaches.  Endo/Heme/Allergies: Negative for environmental allergies and polydipsia. Does not bruise/bleed easily.  Psychiatric/Behavioral: Positive for depression and memory loss. Negative for hallucinations. The patient is nervous/anxious and has insomnia.      Past Medical History  Diagnosis Date  . Diabetes mellitus   . Hypertension   . Heart murmur   . Benign familial tremor   . Rosacea   . Diverticulitis   . Heart failure     diastolic heart failure  . TIA (transient ischemic attack) 2010  . Arthritis   . Breast cancer     breast -rt  . CAD (coronary artery disease)     single vessel CAD per cath in 2006; scattered nonobstructive disease in the left system, left to right collaterals to the RCA which was occluded by flush shots; she has been managed medically   Past Surgical History  Procedure Laterality Date  . Mastectomy partial / lumpectomy  11/02/2000    Right, with SLN  . Mastectomy partial / lumpectomy  05/15/2008    Left  . Breast lumpectomy  01/19/2010    right  . Mastectomy  03/31/2010    Right   Social History:   reports that she has never smoked. She does not have any smokeless tobacco history on file. She reports that she does not drink alcohol or use illicit drugs.  Medications: Patient's Medications  New Prescriptions  No medications on file  Previous Medications   ALUM & MAG HYDROXIDE-SIMETH (MAALOX/MYLANTA) 200-200-20 MG/5ML SUSPENSION    Take 30 mLs by mouth every 6 (six) hours as needed for indigestion or heartburn.   ASPIRIN EC 81 MG TABLET    Take 81 mg by mouth daily.   AYR SALINE NASAL NO-DRIP GEL    Place into the nose 2 (two) times daily as needed (nose bleeds).   DOCUSATE SODIUM (COLACE) 100 MG CAPSULE    Take 200 mg by mouth at bedtime.    DORZOLAMIDE-TIMOLOL (COSOPT) 22.3-6.8 MG/ML OPHTHALMIC SOLUTION    Place 1 drop into the left eye every 12  (twelve) hours.   DULOXETINE (CYMBALTA) 30 MG CAPSULE    Take 30 mg by mouth 2 (two) times daily.   FENTANYL (DURAGESIC - DOSED MCG/HR) 100 MCG/HR    Apply 1 patch every 3 hours. Change site   FUROSEMIDE (LASIX) 40 MG TABLET    Take 40 mg by mouth 2 (two) times daily.    GLUCAGON (GLUCAGEN) 1 MG SOLR INJECTION    Inject 1 mg into the vein once as needed for low blood sugar.   GLUCOSAMINE-CHONDROITIN 500-400 MG TABLET    Take 1 tablet by mouth 3 (three) times daily.   GUAIFENESIN (ROBITUSSIN) 100 MG/5ML SYRUP    Take 300 mg by mouth 3 (three) times daily as needed for cough.   HYDROXYZINE (ATARAX/VISTARIL) 25 MG TABLET    Take 25 mg by mouth at bedtime as needed. For itching   INSULIN ASPART (NOVOLOG) 100 UNIT/ML INJECTION    Inject 8-12 Units into the skin 3 (three) times daily with meals. 8 units If CBG is greater than 150 at lunch and 12 units if CBG is greater than 150 at breakfast and dinner   INSULIN GLARGINE (LANTUS) 100 UNIT/ML INJECTION    Inject 16 Units into the skin at bedtime.    ISOSORBIDE MONONITRATE (IMDUR) 60 MG 24 HR TABLET    Take 60 mg by mouth every morning.    LEVOTHYROXINE (SYNTHROID, LEVOTHROID) 75 MCG TABLET    Take 75-112.5 mcg by mouth daily. Take 1 and 1/2 tablets on Saturdays then take 1 tablet all the other days   LORATADINE (CLARITIN) 10 MG TABLET    Take 10 mg by mouth daily.    LORAZEPAM (ATIVAN) 0.5 MG TABLET    Take 1 mg by mouth every 12 (twelve) hours as needed. Severe agitation   LOSARTAN (COZAAR) 25 MG TABLET    Take 25 mg by mouth daily.   LUBIPROSTONE (AMITIZA) 24 MCG CAPSULE    Take 24 mcg by mouth 2 (two) times daily with a meal.     MEMANTINE HCL ER (NAMENDA XR) 28 MG CP24    Take by mouth.   METHYLCELLULOSE (ARTIFICIAL TEARS) 1 % OPHTHALMIC SOLUTION    Place 1 drop into both eyes 4 (four) times daily as needed (for dryness).   MULTIPLE VITAMINS-MINERALS (CERTAVITE SENIOR/ANTIOXIDANT PO)    Take 1 tablet by mouth daily.   NITROGLYCERIN (NITROSTAT) 0.4 MG  SL TABLET    Place 0.4 mg under the tongue every 5 (five) minutes as needed. For chest pain   NUTRITIONAL SUPPLEMENTS (RESOURCE 2.0 PO)    Take 120 mLs by mouth 3 (three) times daily.   OMEPRAZOLE (PRILOSEC) 20 MG CAPSULE    Take 20 mg by mouth 2 (two) times daily.   PROBIOTIC PRODUCT (ALIGN) 4 MG CAPS    Take 1 capsule by mouth daily.  PROPRANOLOL (INDERAL) 20 MG TABLET    Take 20 mg by mouth 2 (two) times daily.   SPIRONOLACTONE (ALDACTONE) 25 MG TABLET    Take 25 mg by mouth daily.    VITAMIN D, ERGOCALCIFEROL, (DRISDOL) 50000 UNITS CAPS    Take 50,000 Units by mouth every 30 (thirty) days. On the first of the month  Modified Medications   No medications on file  Discontinued Medications   No medications on file     Physical Exam: Physical Exam  Constitutional: She appears well-developed.  HENT:  Head: Normocephalic and atraumatic.  Left Ear: External ear normal.  Eyes: Conjunctivae and EOM are normal. Pupils are equal, round, and reactive to light.  Neck: Neck supple. No JVD present. No thyromegaly present.  Cardiovascular: Normal rate and regular rhythm.   Murmur heard. Pulmonary/Chest: Effort normal. She has rales.  Bibasilar rales.   Abdominal: Soft. There is no tenderness. There is no rebound.  Musculoskeletal: She exhibits edema.  Back pain is chronic which is well controlled. BLE chronic edema 1+  Lymphadenopathy:    She has no cervical adenopathy.  Neurological: She is alert. No cranial nerve deficit. Coordination normal.  Skin: Skin is warm and dry. Rash noted.  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 Intergluteal cleft erythema and beefy red. Left forefoot diabetic ulcer--chronic about a quarter size. Occipital hematoma. Right mastectomy   Psychiatric: Her mood appears anxious. Her affect is not blunt, not labile and not inappropriate. Her speech is rapid and/or pressured (occasionally ). Her speech is not delayed and not slurred. She is hyperactive. She is not agitated,  not aggressive, not slowed, not withdrawn, not actively hallucinating and not combative. Thought content is not paranoid and not delusional. She does not exhibit a depressed mood. She exhibits abnormal recent memory.  MMSE 21/30 08/2013    There were no vitals filed for this visit.    Labs reviewed: Basic Metabolic Panel:  Recent Labs  05/27/13  07/20/13 1921  08/26/13  11/21/13 12/23/13 03/06/14  NA 137  < > 136  < >  --   < > 139 139 138  K 4.5  < > 3.9  < >  --   < > 4.2 3.5 4.6  CL  --   --  97  --   --   --   --   --   --   CO2  --   --  30  --   --   --   --   --   --   GLUCOSE  --   --  147*  --   --   --   --   --   --   BUN 44*  < > 46*  < >  --   < > 23* 30* 33*  CREATININE 1.3*  < > 1.00  < >  --   < > 0.9 1.0 0.9  CALCIUM  --   --  9.3  --   --   --   --   --   --   TSH 3.96  --   --   --  2.07  --   --   --  4.31  < > = values in this interval not displayed. Liver Function Tests:  Recent Labs  07/20/13 1921  10/23/13 11/21/13 03/06/14  AST 19  < > 34 17 20  ALT 12  < > 16 9 8   ALKPHOS 62  < > 62 67  85  BILITOT 0.3  --   --   --   --   PROT 7.0  --   --   --   --   ALBUMIN 2.8*  --   --   --   --   < > = values in this interval not displayed. CBC:  Recent Labs  07/20/13 1921  11/21/13 12/23/13 03/06/14  WBC 9.3  < > 7.7 6.7 5.8  NEUTROABS 6.5  --   --   --   --   HGB 13.8  < > 13.7 13.3 13.4  HCT 39.7  < > 41 40 41  MCV 97.1  --   --   --   --   PLT 299  < > 289 268 214  < > = values in this interval not displayed.  Past Procedures:  10/22/13 CXR patchy interstitial changes left lower lung most consistent with pneumonia  11/21/13 CXR minimal cardiomegaly slightly increased without pulmonary vascular congestion or pleural effusion, no inflammatory consolidate or suspicious nodule, interval resolution of interstitial pneumonitis at the left lower lung.   01/29/14 for minor head injury and dementia: head CT w/o contrast: no acute findings.    Assessment/Plan No problem-specific assessment & plan notes found for this encounter.   Family/ Staff Communication: observe the patient.   Goals of Care: SNF  Labs/tests ordered: none     This encounter was created in error - please disregard.

## 2014-03-21 NOTE — Assessment & Plan Note (Signed)
Much improved. Superficial excoriated buttocks instead of true stage II pressure ulcer as prior. Continue barrier cream and pressure reduction.

## 2014-03-21 NOTE — Assessment & Plan Note (Signed)
Stable, takes colace 100 II qhs and Amitiza 24mcg bid and MiraLax prn.   

## 2014-03-21 NOTE — Assessment & Plan Note (Signed)
Chronic venous insufficiency and dermatitis. Stable. Chronic. 1+. Continue Furosemide and Spironolactone. Bun/creat 33/0.90 03/06/14    

## 2014-03-21 NOTE — Assessment & Plan Note (Signed)
Chronic-coccyalgia in nature, pain is controlled with Fentanyl 100mcg/hr   

## 2014-03-21 NOTE — Assessment & Plan Note (Signed)
Mild head titubation and hands tremor-not disabling. Takes Propranolol 10mg bid  

## 2014-03-21 NOTE — Assessment & Plan Note (Signed)
Rate controlled. Propranolol was increased to 20mg bid for better rate control in setting of HTN and essential tremor. Saw Dr. Brackbill 02/03/14 doing well, EKG A-fib with controlled VR. 1year office visit EKG  

## 2014-03-21 NOTE — Assessment & Plan Note (Signed)
Corrected with Levothyroxine 112.5mcg, last TSH 2.069 08/26/13 and 4.306 03/06/14   

## 2014-03-21 NOTE — Assessment & Plan Note (Signed)
Her MMSE 21/3010/04/2013. Given her hx of cardiovascular issues--vascular dementia is more likely. Tolerated  Namenda and increased Cymbalta to 30mg bid. Mood is better controlled. Gradual decline in her cognition noted. Lack of safety awareness contributed to her frequent falling.    

## 2014-03-21 NOTE — Progress Notes (Signed)
Patient ID: [name], female   DOB: 10/05/1926, 80 y.o.   MRN: [id number]   Code Status: DNR  Allergies  Allergen Reactions  . Penicillins Other (See Comments)    Per MAR  . Bee Venom Other (See Comments)    Per MAR  . Celebrex [Celecoxib] Other (See Comments)    Per MAR  . Lidocaine Hcl Other (See Comments)    Per MAR  . Phenobarbital Other (See Comments)    Per MAR  . Procaine Hcl Other (See Comments)    Per mar    Chief Complaint  Patient presents with  . Medical Management of Chronic Issues    HPI: Patient is a 80 y.o. female seen in the SNF at [facility] today for evaluation of chronic medical conditions.     ED 01/29/14 for minor head injury and dementia: head CT w/o contrast: no acute findings.  Problem List Items Addressed This Visit   Back pain     Chronic-coccyalgia in nature, pain is controlled with Fentanyl 14mcg/hr       Dementia with behavioral disturbance     Her MMSE 21/3010/04/2013. Given her hx of cardiovascular issues--vascular dementia is more likely. Tolerated  Namenda and increased Cymbalta to 30mg  bid. Mood is better controlled. Gradual decline in her cognition noted. Lack of safety awareness contributed to her frequent falling.       Depression     Better with Cymbalta 30mg  bid and newly added Namenda. Observe the patient.       Edema     Chronic venous insufficiency and dermatitis. Stable. Chronic. 1++. Continue Furosemide and Spironolactone. Bun/creat 33/0.90 03/06/14     Essential tremor     Mild head titubation and hands tremor-not disabling. Takes Propranolol 10mg  bid      HTN (hypertension)     Controlled, continue Losartan 25mg  daily, Atenolol, monitor blood pressure daily. Dr. Mare Ferrari 02/03/14 continue same cardiac meds.       PAF (paroxysmal atrial fibrillation)     Rate controlled. Propranolol was increased to 20mg  bid for better rate control in setting of HTN and essential tremor. Saw Dr. Mare Ferrari 02/03/14 doing well,  EKG A-fib with controlled VR. 1year office visit EKG      Pressure ulcer stage II     Much improved. Superficial excoriated buttocks instead of true stage II pressure ulcer as prior. Continue barrier cream and pressure reduction.     Type II or unspecified type diabetes mellitus with peripheral circulatory disorders, uncontrolled(250.72) (Chronic)     No further hypoglycemic episodes since Lantus16 units. Continue bolus Novolog 8 units bid and 12 units with lunch. CBGs am 69-145, noon 152-242, pm 110-234. Hgb A1c 7.8 03/06/14      Ulcer of foot due to diabetes mellitus     Left forefoot-stable--non healing and not infected.      Unspecified constipation     Stable, takes colace 100 II qhs and Amitiza 46mcg bid and MiraLax prn.      Unspecified hypothyroidism (Chronic)     Corrected with Levothyroxine 112.76mcg, last TSH 2.069 08/26/13 and 4.306 03/06/14     Venous stasis dermatitis of both lower extremities - Primary     No new open wounds except left forefoot diabetic foot ulcer. Continue compression wrap.        Review of Systems:  Review of Systems  Constitutional: Negative for fever, chills, weight loss, malaise/fatigue and diaphoresis.  HENT: Positive for hearing loss. Negative for congestion, ear discharge and  sore throat.   Eyes: Negative for pain, discharge and redness.  Respiratory: Negative for cough, sputum production, shortness of breath and wheezing.   Cardiovascular: Positive for leg swelling and PND. Negative for chest pain, palpitations, orthopnea and claudication.       1+  Gastrointestinal: Negative for heartburn, nausea, vomiting, abdominal pain, diarrhea, constipation and blood in stool.  Genitourinary: Positive for frequency. Negative for dysuria, urgency and flank pain.  Musculoskeletal: Positive for back pain, falls and joint pain. Negative for myalgias and neck pain.       Able to ambulate with walker. Last fall 03/05/14  Skin: Positive for rash (chronic  BLE venous dermatitis. Left medial forefoot diabetic foot ulcer). Negative for itching.       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 Intergluteal cleft erythema and beefy red. Left forefoot diabetic ulcer--chronic about a quarter size.  Neurological: Positive for tremors. Negative for dizziness, tingling, sensory change, speech change, focal weakness, seizures, loss of consciousness, weakness and headaches.  Endo/Heme/Allergies: Negative for environmental allergies and polydipsia. Does not bruise/bleed easily.  Psychiatric/Behavioral: Positive for depression and memory loss. Negative for hallucinations. The patient is nervous/anxious and has insomnia.      Past Medical History  Diagnosis Date  . Diabetes mellitus   . Hypertension   . Heart murmur   . Benign familial tremor   . Rosacea   . Diverticulitis   . Heart failure     diastolic heart failure  . TIA (transient ischemic attack) 2010  . Arthritis   . Breast cancer     breast -rt  . CAD (coronary artery disease)     single vessel CAD per cath in 2006; scattered nonobstructive disease in the left system, left to right collaterals to the RCA which was occluded by flush shots; she has been managed medically   Past Surgical History  Procedure Laterality Date  . Mastectomy partial / lumpectomy  11/02/2000    Right, with SLN  . Mastectomy partial / lumpectomy  05/15/2008    Left  . Breast lumpectomy  01/19/2010    right  . Mastectomy  03/31/2010    Right   Social History:   reports that she has never smoked. She does not have any smokeless tobacco history on file. She reports that she does not drink alcohol or use illicit drugs.  Medications: Patient's Medications  New Prescriptions   No medications on file  Previous Medications   ALUM & MAG HYDROXIDE-SIMETH (MAALOX/MYLANTA) 200-200-20 MG/5ML SUSPENSION    Take 30 mLs by mouth every 6 (six) hours as needed for indigestion or heartburn.   ASPIRIN EC 81 MG TABLET    Take 81 mg by mouth  daily.   AYR SALINE NASAL NO-DRIP GEL    Place into the nose 2 (two) times daily as needed (nose bleeds).   DOCUSATE SODIUM (COLACE) 100 MG CAPSULE    Take 200 mg by mouth at bedtime.    DORZOLAMIDE-TIMOLOL (COSOPT) 22.3-6.8 MG/ML OPHTHALMIC SOLUTION    Place 1 drop into the left eye every 12 (twelve) hours.   DULOXETINE (CYMBALTA) 30 MG CAPSULE    Take 30 mg by mouth 2 (two) times daily.   FENTANYL (DURAGESIC - DOSED MCG/HR) 100 MCG/HR    Apply 1 patch every 3 hours. Change site   FUROSEMIDE (LASIX) 40 MG TABLET    Take 40 mg by mouth 2 (two) times daily.    GLUCAGON (GLUCAGEN) 1 MG SOLR INJECTION    Inject 1 mg into the  vein once as needed for low blood sugar.   GLUCOSAMINE-CHONDROITIN 500-400 MG TABLET    Take 1 tablet by mouth 3 (three) times daily.   GUAIFENESIN (ROBITUSSIN) 100 MG/5ML SYRUP    Take 300 mg by mouth 3 (three) times daily as needed for cough.   HYDROXYZINE (ATARAX/VISTARIL) 25 MG TABLET    Take 25 mg by mouth at bedtime as needed. For itching   INSULIN ASPART (NOVOLOG) 100 UNIT/ML INJECTION    Inject 8-12 Units into the skin 3 (three) times daily with meals. 8 units If CBG is greater than 150 at lunch and 12 units if CBG is greater than 150 at breakfast and dinner   INSULIN GLARGINE (LANTUS) 100 UNIT/ML INJECTION    Inject 16 Units into the skin at bedtime.    ISOSORBIDE MONONITRATE (IMDUR) 60 MG 24 HR TABLET    Take 60 mg by mouth every morning.    LEVOTHYROXINE (SYNTHROID, LEVOTHROID) 75 MCG TABLET    Take 75-112.5 mcg by mouth daily. Take 1 and 1/2 tablets on Saturdays then take 1 tablet all the other days   LORATADINE (CLARITIN) 10 MG TABLET    Take 10 mg by mouth daily.    LORAZEPAM (ATIVAN) 0.5 MG TABLET    Take 1 mg by mouth every 12 (twelve) hours as needed. Severe agitation   LOSARTAN (COZAAR) 25 MG TABLET    Take 25 mg by mouth daily.   LUBIPROSTONE (AMITIZA) 24 MCG CAPSULE    Take 24 mcg by mouth 2 (two) times daily with a meal.     MEMANTINE HCL ER (NAMENDA XR) 28  MG CP24    Take by mouth.   METHYLCELLULOSE (ARTIFICIAL TEARS) 1 % OPHTHALMIC SOLUTION    Place 1 drop into both eyes 4 (four) times daily as needed (for dryness).   MULTIPLE VITAMINS-MINERALS (CERTAVITE SENIOR/ANTIOXIDANT PO)    Take 1 tablet by mouth daily.   NITROGLYCERIN (NITROSTAT) 0.4 MG SL TABLET    Place 0.4 mg under the tongue every 5 (five) minutes as needed. For chest pain   NUTRITIONAL SUPPLEMENTS (RESOURCE 2.0 PO)    Take 120 mLs by mouth 3 (three) times daily.   OMEPRAZOLE (PRILOSEC) 20 MG CAPSULE    Take 20 mg by mouth 2 (two) times daily.   PROBIOTIC PRODUCT (ALIGN) 4 MG CAPS    Take 1 capsule by mouth daily.   PROPRANOLOL (INDERAL) 20 MG TABLET    Take 20 mg by mouth 2 (two) times daily.   SPIRONOLACTONE (ALDACTONE) 25 MG TABLET    Take 25 mg by mouth daily.    VITAMIN D, ERGOCALCIFEROL, (DRISDOL) 50000 UNITS CAPS    Take 50,000 Units by mouth every 30 (thirty) days. On the first of the month  Modified Medications   No medications on file  Discontinued Medications   No medications on file     Physical Exam: Physical Exam  Constitutional: She appears well-developed.  HENT:  Head: Normocephalic and atraumatic.  Left Ear: External ear normal.  Eyes: Conjunctivae and EOM are normal. Pupils are equal, round, and reactive to light.  Neck: Neck supple. No JVD present. No thyromegaly present.  Cardiovascular: Normal rate and regular rhythm.   Murmur heard. Pulmonary/Chest: Effort normal. She has rales.  Bibasilar rales.   Abdominal: Soft. There is no tenderness. There is no rebound.  Musculoskeletal: She exhibits edema.  Back pain is chronic which is well controlled. BLE chronic edema 1+  Lymphadenopathy:    She has no cervical  adenopathy.  Neurological: She is alert. No cranial nerve deficit. Coordination normal.  Skin: Skin is warm and dry. Rash noted.  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 Intergluteal cleft erythema and beefy red. Left forefoot diabetic ulcer--chronic  about a quarter size. Occipital hematoma. Right mastectomy   Psychiatric: Her mood appears anxious. Her affect is not blunt, not labile and not inappropriate. Her speech is rapid and/or pressured (occasionally ). Her speech is not delayed and not slurred. She is hyperactive. She is not agitated, not aggressive, not slowed, not withdrawn, not actively hallucinating and not combative. Thought content is not paranoid and not delusional. She does not exhibit a depressed mood. She exhibits abnormal recent memory.  MMSE 21/30 08/2013    Filed Vitals:   03/21/14 0929  BP: 125/75  Pulse: 78  Temp: 97.4 F (36.3 C)  TempSrc: Tympanic  Resp: 20  Weight: 180 lb (81.647 kg)  SpO2: 94%      Labs reviewed: Basic Metabolic Panel:  Recent Labs  05/27/13  07/20/13 1921  08/26/13  11/21/13 12/23/13 03/06/14  NA 137  < > 136  < >  --   < > 139 139 138  K 4.5  < > 3.9  < >  --   < > 4.2 3.5 4.6  CL  --   --  97  --   --   --   --   --   --   CO2  --   --  30  --   --   --   --   --   --   GLUCOSE  --   --  147*  --   --   --   --   --   --   BUN 44*  < > 46*  < >  --   < > 23* 30* 33*  CREATININE 1.3*  < > 1.00  < >  --   < > 0.9 1.0 0.9  CALCIUM  --   --  9.3  --   --   --   --   --   --   TSH 3.96  --   --   --  2.07  --   --   --  4.31  < > = values in this interval not displayed. Liver Function Tests:  Recent Labs  07/20/13 1921  10/23/13 11/21/13 03/06/14  AST 19  < > 34 17 20  ALT 12  < > 16 9 8   ALKPHOS 62  < > 62 67 85  BILITOT 0.3  --   --   --   --   PROT 7.0  --   --   --   --   ALBUMIN 2.8*  --   --   --   --   < > = values in this interval not displayed. CBC:  Recent Labs  07/20/13 1921  11/21/13 12/23/13 03/06/14  WBC 9.3  < > 7.7 6.7 5.8  NEUTROABS 6.5  --   --   --   --   HGB 13.8  < > 13.7 13.3 13.4  HCT 39.7  < > 41 40 41  MCV 97.1  --   --   --   --   PLT 299  < > 289 268 214  < > = values in this interval not displayed.  Past Procedures:  10/22/13 CXR  patchy interstitial changes left lower lung most consistent with pneumonia  11/21/13  CXR minimal cardiomegaly slightly increased without pulmonary vascular congestion or pleural effusion, no inflammatory consolidate or suspicious nodule, interval resolution of interstitial pneumonitis at the left lower lung.   01/29/14 for minor head injury and dementia: head CT w/o contrast: no acute findings.   Assessment/Plan Venous stasis dermatitis of both lower extremities No new open wounds except left forefoot diabetic foot ulcer. Continue compression wrap.   Unspecified hypothyroidism Corrected with Levothyroxine 112.26mcg, last TSH 2.069 08/26/13 and 4.306 03/06/14   Unspecified constipation Stable, takes colace 100 II qhs and Amitiza 51mcg bid and MiraLax prn.    Ulcer of foot due to diabetes mellitus Left forefoot-stable--non healing and not infected.    Type II or unspecified type diabetes mellitus with peripheral circulatory disorders, uncontrolled(250.72) No further hypoglycemic episodes since Lantus16 units. Continue bolus Novolog 8 units bid and 12 units with lunch. CBGs am 69-145, noon 152-242, pm 110-234. Hgb A1c 7.8 03/06/14    Pressure ulcer stage II Much improved. Superficial excoriated buttocks instead of true stage II pressure ulcer as prior. Continue barrier cream and pressure reduction.   PAF (paroxysmal atrial fibrillation) Rate controlled. Propranolol was increased to 20mg  bid for better rate control in setting of HTN and essential tremor. Saw Dr. Mare Ferrari 02/03/14 doing well, EKG A-fib with controlled VR. 1year office visit EKG    HTN (hypertension) Controlled, continue Losartan 25mg  daily, Atenolol, monitor blood pressure daily. Dr. Mare Ferrari 02/03/14 continue same cardiac meds.     Essential tremor Mild head titubation and hands tremor-not disabling. Takes Propranolol 10mg  bid    Depression Better with Cymbalta 30mg  bid and newly added Namenda. Observe the  patient.     Dementia with behavioral disturbance Her MMSE 21/3010/04/2013. Given her hx of cardiovascular issues--vascular dementia is more likely. Tolerated  Namenda and increased Cymbalta to 30mg  bid. Mood is better controlled. Gradual decline in her cognition noted. Lack of safety awareness contributed to her frequent falling.     Back pain Chronic-coccyalgia in nature, pain is controlled with Fentanyl 181mcg/hr     Edema Chronic venous insufficiency and dermatitis. Stable. Chronic. 1++. Continue Furosemide and Spironolactone. Bun/creat 33/0.90 03/06/14     Family/ Staff Communication: observe the patient.   Goals of Care: SNF  Labs/tests ordered: none

## 2014-03-21 NOTE — Assessment & Plan Note (Signed)
Better with Cymbalta 30mg  bid and newly added Namenda. Observe the patient.

## 2014-03-21 NOTE — Progress Notes (Signed)
Patient ID: [name], female   DOB: [birth year], 80 y.o.   MRN: [id number]  This encounter was created in error - please disregard.

## 2014-03-21 NOTE — Assessment & Plan Note (Signed)
Left forefoot-stable--non healing and not infected.

## 2014-03-21 NOTE — Assessment & Plan Note (Signed)
Controlled, continue Losartan 25mg daily, Atenolol, monitor blood pressure daily. Dr. Brackbill 02/03/14 continue same cardiac meds.    

## 2014-03-21 NOTE — Assessment & Plan Note (Signed)
No new open wounds except left forefoot diabetic foot ulcer. Continue compression wrap.

## 2014-03-28 ENCOUNTER — Encounter: Payer: Self-pay | Admitting: *Deleted

## 2014-03-31 ENCOUNTER — Encounter: Payer: Self-pay | Admitting: *Deleted

## 2014-04-18 ENCOUNTER — Encounter: Payer: Self-pay | Admitting: Nurse Practitioner

## 2014-04-18 ENCOUNTER — Non-Acute Institutional Stay (SKILLED_NURSING_FACILITY): Payer: Medicare Other | Admitting: Nurse Practitioner

## 2014-04-18 DIAGNOSIS — I259 Chronic ischemic heart disease, unspecified: Secondary | ICD-10-CM

## 2014-04-18 DIAGNOSIS — G250 Essential tremor: Secondary | ICD-10-CM

## 2014-04-18 DIAGNOSIS — K5900 Constipation, unspecified: Secondary | ICD-10-CM

## 2014-04-18 DIAGNOSIS — F32A Depression, unspecified: Secondary | ICD-10-CM

## 2014-04-18 DIAGNOSIS — K3189 Other diseases of stomach and duodenum: Secondary | ICD-10-CM

## 2014-04-18 DIAGNOSIS — F0391 Unspecified dementia with behavioral disturbance: Secondary | ICD-10-CM

## 2014-04-18 DIAGNOSIS — R5381 Other malaise: Secondary | ICD-10-CM

## 2014-04-18 DIAGNOSIS — M79605 Pain in left leg: Secondary | ICD-10-CM | POA: Insufficient documentation

## 2014-04-18 DIAGNOSIS — F329 Major depressive disorder, single episode, unspecified: Secondary | ICD-10-CM

## 2014-04-18 DIAGNOSIS — M549 Dorsalgia, unspecified: Secondary | ICD-10-CM

## 2014-04-18 DIAGNOSIS — G252 Other specified forms of tremor: Secondary | ICD-10-CM

## 2014-04-18 DIAGNOSIS — E1159 Type 2 diabetes mellitus with other circulatory complications: Secondary | ICD-10-CM

## 2014-04-18 DIAGNOSIS — I8310 Varicose veins of unspecified lower extremity with inflammation: Secondary | ICD-10-CM

## 2014-04-18 DIAGNOSIS — I10 Essential (primary) hypertension: Secondary | ICD-10-CM

## 2014-04-18 DIAGNOSIS — E039 Hypothyroidism, unspecified: Secondary | ICD-10-CM

## 2014-04-18 DIAGNOSIS — R5383 Other fatigue: Principal | ICD-10-CM

## 2014-04-18 DIAGNOSIS — I872 Venous insufficiency (chronic) (peripheral): Secondary | ICD-10-CM

## 2014-04-18 DIAGNOSIS — M79604 Pain in right leg: Secondary | ICD-10-CM

## 2014-04-18 DIAGNOSIS — F03918 Unspecified dementia, unspecified severity, with other behavioral disturbance: Secondary | ICD-10-CM

## 2014-04-18 DIAGNOSIS — R609 Edema, unspecified: Secondary | ICD-10-CM

## 2014-04-18 DIAGNOSIS — M79609 Pain in unspecified limb: Secondary | ICD-10-CM

## 2014-04-18 DIAGNOSIS — F3289 Other specified depressive episodes: Secondary | ICD-10-CM

## 2014-04-18 DIAGNOSIS — R1013 Epigastric pain: Secondary | ICD-10-CM

## 2014-04-18 NOTE — Assessment & Plan Note (Signed)
Stable, takes colace 100 II qhs and Amitiza 66mcg bid and MiraLax prn.

## 2014-04-18 NOTE — Assessment & Plan Note (Signed)
Stable, takes Omeprazole 20mg bid.  

## 2014-04-18 NOTE — Assessment & Plan Note (Signed)
Her MMSE 21/3010/04/2013. Given her hx of cardiovascular issues--vascular dementia is more likely. Tolerated  Namenda and increased Cymbalta to 30mg bid. Mood is better controlled. Gradual decline in her cognition noted. Lack of safety awareness contributed to her frequent falling.    

## 2014-04-18 NOTE — Assessment & Plan Note (Signed)
Still had angina occasionally, f/u Cardiology, continue with Imdur 60mg  and Prn NTG(last dose 02/12/13) and ASA 81mg .

## 2014-04-18 NOTE — Assessment & Plan Note (Signed)
Chronic-coccyalgia in nature, pain is controlled with Fentanyl 100mcg/hr   

## 2014-04-18 NOTE — Assessment & Plan Note (Signed)
Mild head titubation and hands tremor-not disabling. Takes Propranolol 10mg bid  

## 2014-04-18 NOTE — Assessment & Plan Note (Signed)
No new open wounds except left forefoot diabetic foot ulcer. Continue compression wrap. Chronic lip dermatosclerosis and inflammation appearance.

## 2014-04-18 NOTE — Assessment & Plan Note (Signed)
Irritable, lethargic, c/o pain in her left lower leg as well as the right. Able to straight leg raise w/o difficulty. Will obtain CBC, CMP, UA C/S to evaluate further.

## 2014-04-18 NOTE — Assessment & Plan Note (Signed)
Still able to bear weight with pain. May X-ray if no better. Pain management: Fentanyl for back pain should be cover her right leg/ankle pain.

## 2014-04-18 NOTE — Assessment & Plan Note (Signed)
Better with Cymbalta 30mg  bid and Namenda. Observe the patient.

## 2014-04-18 NOTE — Assessment & Plan Note (Signed)
No further hypoglycemic episodes since Lantus16 units. Continue bolus Novolog 8 units bid and 12 units with lunch. Hgb A1c 7.8 03/06/14

## 2014-04-18 NOTE — Assessment & Plan Note (Signed)
Chronic venous insufficiency and dermatitis. Stable. Chronic. 1++. Continue Furosemide and Spironolactone. Bun/creat 33/0.90 03/06/14

## 2014-04-18 NOTE — Progress Notes (Signed)
Patient ID: [name], female   DOB: [birth year], 80 y.o.   MRN: [id number]   Code Status: DNR  Allergies  Allergen Reactions  . Penicillins Other (See Comments)    Per MAR  . Bee Venom Other (See Comments)    Per MAR  . Celebrex [Celecoxib] Other (See Comments)    Per MAR  . Lidocaine Hcl Other (See Comments)    Per MAR  . Phenobarbital Other (See Comments)    Per MAR  . Procaine Hcl Other (See Comments)    Per mar    Chief Complaint  Patient presents with  . Medical Management of Chronic Issues  . Acute Visit    left leg/ankle pain, lethargy.     HPI: Patient is a 80 y.o. female seen in the SNF at [facility] today for evaluation of chronic medical conditions.     ED 01/29/14 for minor head injury and dementia: head CT w/o contrast: no acute findings.  Problem List Items Addressed This Visit   Venous stasis dermatitis of both lower extremities     No new open wounds except left forefoot diabetic foot ulcer. Continue compression wrap. Chronic lip dermatosclerosis and inflammation appearance.      Unspecified hypothyroidism (Chronic)     Corrected with Levothyroxine 112.76mcg, last TSH 2.069 08/26/13 and 4.306 03/06/14      Unspecified constipation     Stable, takes colace 100 II qhs and Amitiza 52mcg bid and MiraLax prn.      Type II or unspecified type diabetes mellitus with peripheral circulatory disorders, uncontrolled(250.72) (Chronic)     No further hypoglycemic episodes since Lantus16 units. Continue bolus Novolog 8 units bid and 12 units with lunch. Hgb A1c 7.8 03/06/14     Other malaise and fatigue     Irritable, lethargic, c/o pain in her left lower leg as well as the right. Able to straight leg raise w/o difficulty. Will obtain CBC, CMP, UA C/S to evaluate further.     Left leg pain - Primary     Still able to bear weight with pain. May X-ray if no better. Pain management: Fentanyl for back pain should be cover her right leg/ankle pain.     Ischemic  heart disease     Still had angina occasionally, f/u Cardiology, continue with Imdur 60mg  and Prn NTG(last dose 02/12/13) and ASA 81mg .        HTN (hypertension)     Controlled, continue Losartan 25mg  daily, Atenolol, monitor blood pressure daily. Dr. Mare Ferrari 02/03/14 continue same cardiac meds.       Essential tremor     Mild head titubation and hands tremor-not disabling. Takes Propranolol 10mg  bid      Edema     Chronic venous insufficiency and dermatitis. Stable. Chronic. 1++. Continue Furosemide and Spironolactone. Bun/creat 33/0.90 03/06/14      Dyspepsia     Stable, takes Omeprazole 20mg  bid.     Depression     Better with Cymbalta 30mg  bid and Namenda. Observe the patient.        Dementia with behavioral disturbance     Her MMSE 21/3010/04/2013. Given her hx of cardiovascular issues--vascular dementia is more likely. Tolerated  Namenda and increased Cymbalta to 30mg  bid. Mood is better controlled. Gradual decline in her cognition noted. Lack of safety awareness contributed to her frequent falling.        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  HENT: Positive for hearing loss. Negative for congestion, ear discharge and sore throat.   Eyes: Negative for pain, discharge and redness.  Respiratory: Negative for cough, sputum production, shortness of breath and wheezing.   Cardiovascular: Positive for leg swelling and PND. Negative for chest pain, palpitations, orthopnea and claudication.       1+  Gastrointestinal: Negative for heartburn, nausea, vomiting, abdominal pain, diarrhea, constipation and blood in stool.  Genitourinary: Positive for frequency. Negative for dysuria, urgency and flank pain.  Musculoskeletal: Positive for back pain, falls and joint pain. Negative for myalgias  and neck pain.       Able to ambulate with walker. Last fall 03/05/14  Skin: Positive for rash (chronic BLE venous dermatitis. Left medial forefoot diabetic foot ulcer). Negative for itching.       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 Intergluteal cleft erythema and beefy red. Left forefoot diabetic ulcer--chronic about a quarter size.  Neurological: Positive for tremors. Negative for dizziness, tingling, sensory change, speech change, focal weakness, seizures, loss of consciousness, weakness and headaches.  Endo/Heme/Allergies: Negative for environmental allergies and polydipsia. Does not bruise/bleed easily.  Psychiatric/Behavioral: Positive for depression and memory loss. Negative for hallucinations. The patient is nervous/anxious and has insomnia.      Past Medical History  Diagnosis Date  . Diabetes mellitus   . Hypertension   . Heart murmur   . Benign familial tremor   . Rosacea   . Diverticulitis   . Heart failure     diastolic heart failure  . TIA (transient ischemic attack) 2010  . Arthritis   . Breast cancer     breast -rt  . CAD (coronary artery disease)     single vessel CAD per cath in 2006; scattered nonobstructive disease in the left system, left to right collaterals to the RCA which was occluded by flush shots; she has been managed medically   Past Surgical History  Procedure Laterality Date  . Mastectomy partial / lumpectomy  11/02/2000    Right, with SLN  . Mastectomy partial / lumpectomy  05/15/2008    Left  . Breast lumpectomy  01/19/2010    right  . Mastectomy  03/31/2010    Right   Social History:   reports that she has never smoked. She does not have any smokeless tobacco history on file. She reports that she does not drink alcohol or use illicit drugs.  Medications: Patient's Medications  New Prescriptions   No medications on file  Previous Medications   ALUM & MAG HYDROXIDE-SIMETH (MAALOX/MYLANTA) 200-200-20 MG/5ML SUSPENSION    Take 30 mLs by mouth every  6 (six) hours as needed for indigestion or heartburn.   ASPIRIN EC 81 MG TABLET    Take 81 mg by mouth daily.   AYR SALINE NASAL NO-DRIP GEL    Place into the nose 2 (two) times daily as needed (nose bleeds).   DOCUSATE SODIUM (COLACE) 100 MG CAPSULE    Take 200 mg by mouth at bedtime.    DORZOLAMIDE-TIMOLOL (COSOPT) 22.3-6.8 MG/ML OPHTHALMIC SOLUTION    Place 1 drop into the left eye every 12 (twelve) hours.   DULOXETINE (CYMBALTA) 30 MG CAPSULE    Take 30 mg by mouth 2 (two) times daily.   FENTANYL (DURAGESIC - DOSED MCG/HR) 100 MCG/HR    Apply 1 patch every 3 hours. Change site   FUROSEMIDE (LASIX) 40 MG  TABLET    Take 40 mg by mouth 2 (two) times daily.    GLUCAGON (GLUCAGEN) 1 MG SOLR INJECTION    Inject 1 mg into the vein once as needed for low blood sugar.   GLUCOSAMINE-CHONDROITIN 500-400 MG TABLET    Take 1 tablet by mouth 3 (three) times daily.   GUAIFENESIN (ROBITUSSIN) 100 MG/5ML SYRUP    Take 300 mg by mouth 3 (three) times daily as needed for cough.   HYDROXYZINE (ATARAX/VISTARIL) 25 MG TABLET    Take 25 mg by mouth at bedtime as needed. For itching   INSULIN ASPART (NOVOLOG) 100 UNIT/ML INJECTION    Inject 8-12 Units into the skin 3 (three) times daily with meals. 8 units If CBG is greater than 150 at lunch and 12 units if CBG is greater than 150 at breakfast and dinner   INSULIN GLARGINE (LANTUS) 100 UNIT/ML INJECTION    Inject 16 Units into the skin at bedtime.    ISOSORBIDE MONONITRATE (IMDUR) 60 MG 24 HR TABLET    Take 60 mg by mouth every morning.    LEVOTHYROXINE (SYNTHROID, LEVOTHROID) 75 MCG TABLET    Take 75-112.5 mcg by mouth daily. Take 1 and 1/2 tablets on Saturdays then take 1 tablet all the other days   LORATADINE (CLARITIN) 10 MG TABLET    Take 10 mg by mouth daily.    LORAZEPAM (ATIVAN) 0.5 MG TABLET    Take 1 mg by mouth every 12 (twelve) hours as needed. Severe agitation   LOSARTAN (COZAAR) 25 MG TABLET    Take 25 mg by mouth daily.   LUBIPROSTONE (AMITIZA) 24 MCG  CAPSULE    Take 24 mcg by mouth 2 (two) times daily with a meal.     MEMANTINE HCL ER (NAMENDA XR) 28 MG CP24    Take by mouth.   METHYLCELLULOSE (ARTIFICIAL TEARS) 1 % OPHTHALMIC SOLUTION    Place 1 drop into both eyes 4 (four) times daily as needed (for dryness).   MULTIPLE VITAMINS-MINERALS (CERTAVITE SENIOR/ANTIOXIDANT PO)    Take 1 tablet by mouth daily.   NITROGLYCERIN (NITROSTAT) 0.4 MG SL TABLET    Place 0.4 mg under the tongue every 5 (five) minutes as needed. For chest pain   NUTRITIONAL SUPPLEMENTS (RESOURCE 2.0 PO)    Take 120 mLs by mouth 3 (three) times daily.   OMEPRAZOLE (PRILOSEC) 20 MG CAPSULE    Take 20 mg by mouth 2 (two) times daily.   PROBIOTIC PRODUCT (ALIGN) 4 MG CAPS    Take 1 capsule by mouth daily.   PROPRANOLOL (INDERAL) 20 MG TABLET    Take 20 mg by mouth 2 (two) times daily.   SPIRONOLACTONE (ALDACTONE) 25 MG TABLET    Take 25 mg by mouth daily.    VITAMIN D, ERGOCALCIFEROL, (DRISDOL) 50000 UNITS CAPS    Take 50,000 Units by mouth every 30 (thirty) days. On the first of the month  Modified Medications   No medications on file  Discontinued Medications   No medications on file     Physical Exam: Physical Exam  Constitutional: She appears well-developed.  HENT:  Head: Normocephalic and atraumatic.  Left Ear: External ear normal.  Eyes: Conjunctivae and EOM are normal. Pupils are equal, round, and reactive to light.  Neck: Neck supple. No JVD present. No thyromegaly present.  Cardiovascular: Normal rate and regular rhythm.   Murmur heard. Pulmonary/Chest: Effort normal. She has rales.  Bibasilar rales.   Abdominal: Soft. There is no tenderness. There  is no rebound.  Musculoskeletal: She exhibits edema.  Back pain is chronic which is well controlled. BLE chronic edema 1+  Lymphadenopathy:    She has no cervical adenopathy.  Neurological: She is alert. No cranial nerve deficit. Coordination normal.  Skin: Skin is warm and dry. Rash noted.  Chronic venous  dermatitis in BLE. Intergluteal cleft erythema and beefy red. Left forefoot diabetic ulcer--chronic about a quarter size. Occipital hematoma. Right mastectomy   Psychiatric: Her mood appears anxious. Her affect is not blunt, not labile and not inappropriate. Her speech is rapid and/or pressured (occasionally ). Her speech is not delayed and not slurred. She is hyperactive. She is not agitated, not aggressive, not slowed, not withdrawn, not actively hallucinating and not combative. Thought content is not paranoid and not delusional. She does not exhibit a depressed mood. She exhibits abnormal recent memory.  MMSE 21/30 08/2013    Filed Vitals:   04/18/14 1307  BP: 115/63  Pulse: 68  Temp: 99 F (37.2 C)  TempSrc: Tympanic  Resp: 20      Labs reviewed: Basic Metabolic Panel:  Recent Labs  05/27/13  07/20/13 1921  08/26/13  11/21/13 12/23/13 03/06/14  NA 137  < > 136  < >  --   < > 139 139 138  K 4.5  < > 3.9  < >  --   < > 4.2 3.5 4.6  CL  --   --  97  --   --   --   --   --   --   CO2  --   --  30  --   --   --   --   --   --   GLUCOSE  --   --  147*  --   --   --   --   --   --   BUN 44*  < > 46*  < >  --   < > 23* 30* 33*  CREATININE 1.3*  < > 1.00  < >  --   < > 0.9 1.0 0.9  CALCIUM  --   --  9.3  --   --   --   --   --   --   TSH 3.96  --   --   --  2.07  --   --   --  4.31  < > = values in this interval not displayed. Liver Function Tests:  Recent Labs  07/20/13 1921  10/23/13 11/21/13 03/06/14  AST 19  < > 34 17 20  ALT 12  < > 16 9 8   ALKPHOS 62  < > 62 67 85  BILITOT 0.3  --   --   --   --   PROT 7.0  --   --   --   --   ALBUMIN 2.8*  --   --   --   --   < > = values in this interval not displayed. CBC:  Recent Labs  07/20/13 1921  11/21/13 12/23/13 03/06/14  WBC 9.3  < > 7.7 6.7 5.8  NEUTROABS 6.5  --   --   --   --   HGB 13.8  < > 13.7 13.3 13.4  HCT 39.7  < > 41 40 41  MCV 97.1  --   --   --   --   PLT 299  < > 289 268 214  < > = values in this  interval not displayed.  Past Procedures:  10/22/13 CXR patchy interstitial changes left lower lung most consistent with pneumonia  11/21/13 CXR minimal cardiomegaly slightly increased without pulmonary vascular congestion or pleural effusion, no inflammatory consolidate or suspicious nodule, interval resolution of interstitial pneumonitis at the left lower lung.   01/29/14 for minor head injury and dementia: head CT w/o contrast: no acute findings.   Assessment/Plan Type II or unspecified type diabetes mellitus with peripheral circulatory disorders, uncontrolled(250.72) No further hypoglycemic episodes since Lantus16 units. Continue bolus Novolog 8 units bid and 12 units with lunch. 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      HTN (hypertension) Controlled, continue Losartan 25mg  daily, Atenolol, monitor blood pressure daily. Dr. Mare Ferrari 02/03/14 continue same cardiac meds.     Dementia with behavioral disturbance Her MMSE 21/3010/04/2013. Given her hx of cardiovascular issues--vascular dementia is more likely. Tolerated  Namenda and increased Cymbalta to 30mg  bid. Mood is better controlled. Gradual decline in her cognition noted. Lack of safety awareness contributed to her frequent falling.      Edema Chronic venous insufficiency and dermatitis. Stable. Chronic. 1++. Continue Furosemide and Spironolactone. Bun/creat 33/0.90 03/06/14    Dyspepsia Stable, takes Omeprazole 20mg  bid.   Unspecified constipation Stable, takes colace 100 II qhs and Amitiza 80mcg bid and MiraLax prn.    Essential tremor Mild head titubation and hands tremor-not disabling. Takes  Propranolol 10mg  bid    Depression Better with Cymbalta 30mg  bid and Namenda. Observe the patient.      Left leg pain Still able to bear weight with pain. May X-ray if no better. Pain management: Fentanyl for back pain should be cover her right leg/ankle pain.   Venous stasis dermatitis of both lower extremities No new open wounds except left forefoot diabetic foot ulcer. Continue compression wrap. Chronic lip dermatosclerosis and inflammation appearance.    Other malaise and fatigue Irritable, lethargic, c/o pain in her left lower leg as well as the right. Able to straight leg raise w/o difficulty. Will obtain CBC, CMP, UA C/S to evaluate further.     Family/ Staff Communication: observe the patient.   Goals of Care: SNF  Labs/tests ordered: CBC, CMP, UA C/S, X-ray L knee/tibia/fibula/ankle-3 views.

## 2014-04-18 NOTE — Assessment & Plan Note (Signed)
Corrected with Levothyroxine 112.63mcg, last TSH 2.069 08/26/13 and 4.306 03/06/14

## 2014-04-18 NOTE — Assessment & Plan Note (Signed)
Controlled, continue Losartan 25mg daily, Atenolol, monitor blood pressure daily. Dr. Brackbill 02/03/14 continue same cardiac meds.    

## 2014-04-20 LAB — CBC AND DIFFERENTIAL
HCT: 42 % (ref 36–46)
HEMOGLOBIN: 14.3 g/dL (ref 12.0–16.0)
PLATELETS: 260 10*3/uL (ref 150–399)
WBC: 6.4 10*3/mL

## 2014-04-20 LAB — BASIC METABOLIC PANEL
BUN: 30 mg/dL — AB (ref 4–21)
Creatinine: 0.8 mg/dL (ref 0.5–1.1)
Glucose: 94 mg/dL
Potassium: 4.9 mmol/L (ref 3.4–5.3)
Sodium: 136 mmol/L — AB (ref 137–147)

## 2014-04-20 LAB — HEPATIC FUNCTION PANEL
ALT: 12 U/L (ref 7–35)
AST: 19 U/L (ref 13–35)
Alkaline Phosphatase: 79 U/L (ref 25–125)
Bilirubin, Total: 0.4 mg/dL

## 2014-04-21 ENCOUNTER — Other Ambulatory Visit: Payer: Self-pay | Admitting: *Deleted

## 2014-04-21 MED ORDER — FENTANYL 100 MCG/HR TD PT72: MEDICATED_PATCH | TRANSDERMAL | Status: DC

## 2014-04-21 NOTE — Telephone Encounter (Signed)
[facility] 

## 2014-04-22 ENCOUNTER — Non-Acute Institutional Stay (SKILLED_NURSING_FACILITY): Payer: Medicare Other | Admitting: Nurse Practitioner

## 2014-04-22 ENCOUNTER — Encounter: Payer: Self-pay | Admitting: Nurse Practitioner

## 2014-04-22 DIAGNOSIS — R609 Edema, unspecified: Secondary | ICD-10-CM

## 2014-04-22 DIAGNOSIS — M79609 Pain in unspecified limb: Secondary | ICD-10-CM

## 2014-04-22 DIAGNOSIS — I4891 Unspecified atrial fibrillation: Secondary | ICD-10-CM

## 2014-04-22 DIAGNOSIS — K5900 Constipation, unspecified: Secondary | ICD-10-CM

## 2014-04-22 DIAGNOSIS — E039 Hypothyroidism, unspecified: Secondary | ICD-10-CM

## 2014-04-22 DIAGNOSIS — F0391 Unspecified dementia with behavioral disturbance: Secondary | ICD-10-CM

## 2014-04-22 DIAGNOSIS — F32A Depression, unspecified: Secondary | ICD-10-CM

## 2014-04-22 DIAGNOSIS — F03918 Unspecified dementia, unspecified severity, with other behavioral disturbance: Secondary | ICD-10-CM

## 2014-04-22 DIAGNOSIS — G250 Essential tremor: Secondary | ICD-10-CM

## 2014-04-22 DIAGNOSIS — M79605 Pain in left leg: Secondary | ICD-10-CM

## 2014-04-22 DIAGNOSIS — E1159 Type 2 diabetes mellitus with other circulatory complications: Secondary | ICD-10-CM

## 2014-04-22 DIAGNOSIS — G252 Other specified forms of tremor: Secondary | ICD-10-CM

## 2014-04-22 DIAGNOSIS — I10 Essential (primary) hypertension: Secondary | ICD-10-CM

## 2014-04-22 DIAGNOSIS — K3189 Other diseases of stomach and duodenum: Secondary | ICD-10-CM

## 2014-04-22 DIAGNOSIS — F3289 Other specified depressive episodes: Secondary | ICD-10-CM

## 2014-04-22 DIAGNOSIS — F329 Major depressive disorder, single episode, unspecified: Secondary | ICD-10-CM

## 2014-04-22 DIAGNOSIS — I259 Chronic ischemic heart disease, unspecified: Secondary | ICD-10-CM

## 2014-04-22 DIAGNOSIS — R1013 Epigastric pain: Secondary | ICD-10-CM

## 2014-04-22 DIAGNOSIS — N390 Urinary tract infection, site not specified: Secondary | ICD-10-CM

## 2014-04-22 DIAGNOSIS — M549 Dorsalgia, unspecified: Secondary | ICD-10-CM

## 2014-04-22 NOTE — Assessment & Plan Note (Signed)
Better with Cymbalta 30mg  bid and Namenda. Observe the patient.

## 2014-04-22 NOTE — Assessment & Plan Note (Signed)
Chronic-coccyalgia in nature, pain is controlled with Fentanyl 100mcg/hr   

## 2014-04-22 NOTE — Assessment & Plan Note (Signed)
Mild head titubation and hands tremor-not disabling. Takes Propranolol 10mg bid  

## 2014-04-22 NOTE — Assessment & Plan Note (Signed)
Her MMSE 21/3010/04/2013. Given her hx of cardiovascular issues--vascular dementia is more likely. Tolerated  Namenda and increased Cymbalta to 30mg bid. Mood is better controlled. Gradual decline in her cognition noted. Lack of safety awareness contributed to her frequent falling.    

## 2014-04-22 NOTE — Progress Notes (Signed)
Patient ID: [name], female   DOB: 1926-06-12, 80 y.o.   MRN: [id number]   Code Status: DNR  Allergies  Allergen Reactions  . Penicillins Other (See Comments)    Per MAR  . Bee Venom Other (See Comments)    Per MAR  . Celebrex [Celecoxib] Other (See Comments)    Per MAR  . Lidocaine Hcl Other (See Comments)    Per MAR  . Phenobarbital Other (See Comments)    Per MAR  . Procaine Hcl Other (See Comments)    Per mar    Chief Complaint  Patient presents with  . Medical Management of Chronic Issues  . Acute Visit    UTI, LLE pain.     HPI: Patient is a 80 y.o. female seen in the SNF at [facility] today for evaluation of UTI, LLE pain, and chronic medical conditions.     ED 01/29/14 for minor head injury and dementia: head CT w/o contrast: no acute findings.  Problem List Items Addressed This Visit   Urinary tract infection, site not specified - Primary     Urine culture 04/19/14 showed Gram Negative Rods >100,000c/ml, will treat with ABT per culture report-Cipro 500mg  bid x 7 days.     Unspecified hypothyroidism (Chronic)     Corrected with Levothyroxine 112.50mcg, last TSH 2.069 08/26/13 and 4.306 03/06/14      Unspecified constipation     Stable, takes colace 100 II qhs and Amitiza 39mcg bid and MiraLax prn.      Type II or unspecified type diabetes mellitus with peripheral circulatory disorders, uncontrolled(250.72) (Chronic)     No further hypoglycemic episodes since Lantus16 units. Continue bolus Novolog 8 units bid and 12 units with lunch. Hgb A1c 7.8 03/06/14      Left leg pain     Still able to bear weight with pain. 04/20/14 Xray L knee/ankle/tibia/fibula: no acute fracture or dislocation. Pain management: Fentanyl for back pain should be cover her right leg/ankle pain. W/c for mobility until LLE pain is better. Otherwise Ortho consult.     Ischemic heart disease     angina occasionally, f/u Cardiology, continue with Imdur 60mg  and Prn NTG(last dose 02/12/13)  and ASA 81mg .         HTN (hypertension)     Controlled, continue Losartan 25mg  daily, Atenolol, monitor blood pressure daily. Dr. Mare Ferrari 02/03/14 continue same cardiac meds.       Essential tremor     Mild head titubation and hands tremor-not disabling. Takes Propranolol 10mg  bid       Edema     Chronic venous insufficiency and dermatitis. Stable. Chronic. 1+. Continue Furosemide and Spironolactone. Bun/creat 33/0.90 03/06/14       Dyspepsia     Stable, takes Omeprazole 20mg  bid.      Depression     Better with Cymbalta 30mg  bid and Namenda. Observe the patient.        Dementia with behavioral disturbance     Her MMSE 21/3010/04/2013. Given her hx of cardiovascular issues--vascular dementia is more likely. Tolerated  Namenda and increased Cymbalta to 30mg  bid. Mood is better controlled. Gradual decline in her cognition noted. Lack of safety awareness contributed to her frequent falling.         Back pain     Chronic-coccyalgia in nature, pain is controlled with Fentanyl 140mcg/hr       A-fib     Rate controlled. Propranolol was increased to 20mg  bid for better rate control in  setting of HTN and essential tremor. Saw Dr. Mare Ferrari 02/03/14 doing well, EKG A-fib with controlled VR. 1year office visit EKG          Review of Systems:  Review of Systems  Constitutional: Negative for fever, chills, weight loss, malaise/fatigue and diaphoresis.  HENT: Positive for hearing loss. Negative for congestion, ear discharge and sore throat.   Eyes: Negative for pain, discharge and redness.  Respiratory: Negative for cough, sputum production, shortness of breath and wheezing.   Cardiovascular: Positive for leg swelling and PND. Negative for chest pain, palpitations, orthopnea and claudication.       1+  Gastrointestinal: Negative for heartburn, nausea, vomiting, abdominal pain, diarrhea, constipation and blood in stool.  Genitourinary: Positive for frequency. Negative  for dysuria, urgency and flank pain.  Musculoskeletal: Positive for back pain, falls and joint pain. Negative for myalgias and neck pain.       Able to ambulate with walker. Last fall 03/05/14  Skin: Positive for rash (chronic BLE venous dermatitis. Left medial forefoot diabetic foot ulcer). Negative for itching.       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 Intergluteal cleft erythema and beefy red. Left forefoot diabetic ulcer--chronic about a quarter size.  Neurological: Positive for tremors. Negative for dizziness, tingling, sensory change, speech change, focal weakness, seizures, loss of consciousness, weakness and headaches.  Endo/Heme/Allergies: Negative for environmental allergies and polydipsia. Does not bruise/bleed easily.  Psychiatric/Behavioral: Positive for depression and memory loss. Negative for hallucinations. The patient is nervous/anxious and has insomnia.      Past Medical History  Diagnosis Date  . Diabetes mellitus   . Hypertension   . Heart murmur   . Benign familial tremor   . Rosacea   . Diverticulitis   . Heart failure     diastolic heart failure  . TIA (transient ischemic attack) 2010  . Arthritis   . Breast cancer     breast -rt  . CAD (coronary artery disease)     single vessel CAD per cath in 2006; scattered nonobstructive disease in the left system, left to right collaterals to the RCA which was occluded by flush shots; she has been managed medically   Past Surgical History  Procedure Laterality Date  . Mastectomy partial / lumpectomy  11/02/2000    Right, with SLN  . Mastectomy partial / lumpectomy  05/15/2008    Left  . Breast lumpectomy  01/19/2010    right  . Mastectomy  03/31/2010    Right   Social History:   reports that she has never smoked. She does not have any smokeless tobacco history on file. She reports that she does not drink alcohol or use illicit drugs.  Medications: Patient's Medications  New Prescriptions   No medications on file    Previous Medications   ALUM & MAG HYDROXIDE-SIMETH (MAALOX/MYLANTA) 200-200-20 MG/5ML SUSPENSION    Take 30 mLs by mouth every 6 (six) hours as needed for indigestion or heartburn.   ASPIRIN EC 81 MG TABLET    Take 81 mg by mouth daily.   AYR SALINE NASAL NO-DRIP GEL    Place into the nose 2 (two) times daily as needed (nose bleeds).   DOCUSATE SODIUM (COLACE) 100 MG CAPSULE    Take 200 mg by mouth at bedtime.    DORZOLAMIDE-TIMOLOL (COSOPT) 22.3-6.8 MG/ML OPHTHALMIC SOLUTION    Place 1 drop into the left eye every 12 (twelve) hours.   DULOXETINE (CYMBALTA) 30 MG CAPSULE    Take 30 mg by  mouth 2 (two) times daily.   FENTANYL (DURAGESIC - DOSED MCG/HR) 100 MCG/HR    Apply one patch topically every 3 days for pain   FUROSEMIDE (LASIX) 40 MG TABLET    Take 40 mg by mouth 2 (two) times daily.    GLUCAGON (GLUCAGEN) 1 MG SOLR INJECTION    Inject 1 mg into the vein once as needed for low blood sugar.   GLUCOSAMINE-CHONDROITIN 500-400 MG TABLET    Take 1 tablet by mouth 3 (three) times daily.   GUAIFENESIN (ROBITUSSIN) 100 MG/5ML SYRUP    Take 300 mg by mouth 3 (three) times daily as needed for cough.   HYDROXYZINE (ATARAX/VISTARIL) 25 MG TABLET    Take 25 mg by mouth at bedtime as needed. For itching   INSULIN ASPART (NOVOLOG) 100 UNIT/ML INJECTION    Inject 8-12 Units into the skin 3 (three) times daily with meals. 8 units If CBG is greater than 150 at lunch and 12 units if CBG is greater than 150 at breakfast and dinner   INSULIN GLARGINE (LANTUS) 100 UNIT/ML INJECTION    Inject 16 Units into the skin at bedtime.    ISOSORBIDE MONONITRATE (IMDUR) 60 MG 24 HR TABLET    Take 60 mg by mouth every morning.    LEVOTHYROXINE (SYNTHROID, LEVOTHROID) 75 MCG TABLET    Take 75-112.5 mcg by mouth daily. Take 1 and 1/2 tablets on Saturdays then take 1 tablet all the other days   LORATADINE (CLARITIN) 10 MG TABLET    Take 10 mg by mouth daily.    LORAZEPAM (ATIVAN) 0.5 MG TABLET    Take 1 mg by mouth every 12  (twelve) hours as needed. Severe agitation   LOSARTAN (COZAAR) 25 MG TABLET    Take 25 mg by mouth daily.   LUBIPROSTONE (AMITIZA) 24 MCG CAPSULE    Take 24 mcg by mouth 2 (two) times daily with a meal.     MEMANTINE HCL ER (NAMENDA XR) 28 MG CP24    Take by mouth.   METHYLCELLULOSE (ARTIFICIAL TEARS) 1 % OPHTHALMIC SOLUTION    Place 1 drop into both eyes 4 (four) times daily as needed (for dryness).   MULTIPLE VITAMINS-MINERALS (CERTAVITE SENIOR/ANTIOXIDANT PO)    Take 1 tablet by mouth daily.   NITROGLYCERIN (NITROSTAT) 0.4 MG SL TABLET    Place 0.4 mg under the tongue every 5 (five) minutes as needed. For chest pain   NUTRITIONAL SUPPLEMENTS (RESOURCE 2.0 PO)    Take 120 mLs by mouth 3 (three) times daily.   OMEPRAZOLE (PRILOSEC) 20 MG CAPSULE    Take 20 mg by mouth 2 (two) times daily.   PROBIOTIC PRODUCT (ALIGN) 4 MG CAPS    Take 1 capsule by mouth daily.   PROPRANOLOL (INDERAL) 20 MG TABLET    Take 20 mg by mouth 2 (two) times daily.   SPIRONOLACTONE (ALDACTONE) 25 MG TABLET    Take 25 mg by mouth daily.    VITAMIN D, ERGOCALCIFEROL, (DRISDOL) 50000 UNITS CAPS    Take 50,000 Units by mouth every 30 (thirty) days. On the first of the month  Modified Medications   No medications on file  Discontinued Medications   No medications on file     Physical Exam: Physical Exam  Constitutional: She appears well-developed.  HENT:  Head: Normocephalic and atraumatic.  Left Ear: External ear normal.  Eyes: Conjunctivae and EOM are normal. Pupils are equal, round, and reactive to light.  Neck: Neck supple. No JVD present.  No thyromegaly present.  Cardiovascular: Normal rate and regular rhythm.   Murmur heard. Pulmonary/Chest: Effort normal. She has rales.  Bibasilar rales.   Abdominal: Soft. There is no tenderness. There is no rebound.  Musculoskeletal: She exhibits edema.  Back pain is chronic which is well controlled. BLE chronic edema 1+  Lymphadenopathy:    She has no cervical  adenopathy.  Neurological: She is alert. No cranial nerve deficit. Coordination normal.  Skin: Skin is warm and dry. Rash noted.  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 Intergluteal cleft erythema and beefy red. Left forefoot diabetic ulcer--chronic about a quarter size. Occipital hematoma. Right mastectomy   Psychiatric: Her mood appears anxious. Her affect is not blunt, not labile and not inappropriate. Her speech is rapid and/or pressured (occasionally ). Her speech is not delayed and not slurred. She is hyperactive. She is not agitated, not aggressive, not slowed, not withdrawn, not actively hallucinating and not combative. Thought content is not paranoid and not delusional. She does not exhibit a depressed mood. She exhibits abnormal recent memory.  MMSE 21/30 08/2013    Filed Vitals:   04/22/14 1409  BP: 130/64  Pulse: 73  Temp: 98 F (36.7 C)  TempSrc: Tympanic  Resp: 18  SpO2: 96%      Labs reviewed: Basic Metabolic Panel:  Recent Labs  05/27/13  07/20/13 1921  08/26/13  12/23/13 03/06/14 04/20/14  NA 137  < > 136  < >  --   < > 139 138 136*  K 4.5  < > 3.9  < >  --   < > 3.5 4.6 4.9  CL  --   --  97  --   --   --   --   --   --   CO2  --   --  30  --   --   --   --   --   --   GLUCOSE  --   --  147*  --   --   --   --   --   --   BUN 44*  < > 46*  < >  --   < > 30* 33* 30*  CREATININE 1.3*  < > 1.00  < >  --   < > 1.0 0.9 0.8  CALCIUM  --   --  9.3  --   --   --   --   --   --   TSH 3.96  --   --   --  2.07  --   --  4.31  --   < > = values in this interval not displayed. Liver Function Tests:  Recent Labs  07/20/13 1921  11/21/13 03/06/14 04/20/14  AST 19  < > 17 20 19   ALT 12  < > 9 8 12   ALKPHOS 62  < > 67 85 79  BILITOT 0.3  --   --   --   --   PROT 7.0  --   --   --   --   ALBUMIN 2.8*  --   --   --   --   < > = values in this interval not displayed. CBC:  Recent Labs  07/20/13 1921  12/23/13 03/06/14 04/20/14  WBC 9.3  < > 6.7 5.8 6.4  NEUTROABS 6.5   --   --   --   --   HGB 13.8  < > 13.3 13.4 14.3  HCT 39.7  < >  40 41 42  MCV 97.1  --   --   --   --   PLT 299  < > 268 214 260  < > = values in this interval not displayed.  Past Procedures:  10/22/13 CXR patchy interstitial changes left lower lung most consistent with pneumonia  11/21/13 CXR minimal cardiomegaly slightly increased without pulmonary vascular congestion or pleural effusion, no inflammatory consolidate or suspicious nodule, interval resolution of interstitial pneumonitis at the left lower lung.   01/29/14 for minor head injury and dementia: head CT w/o contrast: no acute findings.   04/18/14 X-ray L knee/ankle/tibia/fibula: no acute fracture or dislocation  Assessment/Plan Left leg pain Still able to bear weight with pain. 04/20/14 Xray L knee/ankle/tibia/fibula: no acute fracture or dislocation. Pain management: Fentanyl for back pain should be cover her right leg/ankle pain. W/c for mobility until LLE pain is better. Otherwise Ortho consult.   Urinary tract infection, site not specified Urine culture 04/19/14 showed Gram Negative Rods >100,000c/ml, will treat with ABT per culture report-Cipro 500mg  bid x 7 days.   Unspecified hypothyroidism Corrected with Levothyroxine 112.25mcg, last TSH 2.069 08/26/13 and 4.306 03/06/14    Type II or unspecified type diabetes mellitus with peripheral circulatory disorders, uncontrolled(250.72) No further hypoglycemic episodes since Lantus16 units. Continue bolus Novolog 8 units bid and 12 units with lunch. Hgb A1c 7.8 03/06/14    Ischemic heart disease angina occasionally, f/u Cardiology, continue with Imdur 60mg  and Prn NTG(last dose 02/12/13) and ASA 81mg .       HTN (hypertension) Controlled, continue Losartan 25mg  daily, Atenolol, monitor blood pressure daily. Dr. Mare Ferrari 02/03/14 continue same cardiac meds.     Edema Chronic venous insufficiency and dermatitis. Stable. Chronic. 1+. Continue Furosemide and Spironolactone.  Bun/creat 33/0.90 03/06/14     Dementia with behavioral disturbance Her MMSE 21/3010/04/2013. Given her hx of cardiovascular issues--vascular dementia is more likely. Tolerated  Namenda and increased Cymbalta to 30mg  bid. Mood is better controlled. Gradual decline in her cognition noted. Lack of safety awareness contributed to her frequent falling.       Essential tremor Mild head titubation and hands tremor-not disabling. Takes Propranolol 10mg  bid     Depression Better with Cymbalta 30mg  bid and Namenda. Observe the patient.      A-fib Rate controlled. Propranolol was increased to 20mg  bid for better rate control in setting of HTN and essential tremor. Saw Dr. Mare Ferrari 02/03/14 doing well, EKG A-fib with controlled VR. 1year office visit EKG     Unspecified constipation Stable, takes colace 100 II qhs and Amitiza 30mcg bid and MiraLax prn.    Back pain Chronic-coccyalgia in nature, pain is controlled with Fentanyl 115mcg/hr     Dyspepsia Stable, takes Omeprazole 20mg  bid.      Family/ Staff Communication: observe the patient.   Goals of Care: SNF  Labs/tests ordered: none

## 2014-04-22 NOTE — Assessment & Plan Note (Signed)
Corrected with Levothyroxine 112.56mcg, last TSH 2.069 08/26/13 and 4.306 03/06/14

## 2014-04-22 NOTE — Assessment & Plan Note (Signed)
No further hypoglycemic episodes since Lantus16 units. Continue bolus Novolog 8 units bid and 12 units with lunch. Hgb A1c 7.8 03/06/14

## 2014-04-22 NOTE — Assessment & Plan Note (Addendum)
Still able to bear weight with pain. 04/20/14 Xray L knee/ankle/tibia/fibula: no acute fracture or dislocation. Pain management: Fentanyl for back pain should be cover her right leg/ankle pain. W/c for mobility until LLE pain is better. Otherwise Ortho consult.

## 2014-04-22 NOTE — Assessment & Plan Note (Signed)
angina occasionally, f/u Cardiology, continue with Imdur 60mg and Prn NTG(last dose 02/12/13) and ASA 81mg.    

## 2014-04-22 NOTE — Assessment & Plan Note (Signed)
Stable, takes colace 100 II qhs and Amitiza 71mcg bid and MiraLax prn.

## 2014-04-22 NOTE — Assessment & Plan Note (Signed)
Stable, takes Omeprazole 20mg bid.  

## 2014-04-22 NOTE — Assessment & Plan Note (Signed)
Rate controlled. Propranolol was increased to 20mg bid for better rate control in setting of HTN and essential tremor. Saw Dr. Brackbill 02/03/14 doing well, EKG A-fib with controlled VR. 1year office visit EKG  

## 2014-04-22 NOTE — Assessment & Plan Note (Addendum)
Urine culture 04/19/14 showed Gram Negative Rods >100,000c/ml, will treat with ABT per culture report-Cipro 500mg  bid x 7 days.

## 2014-04-22 NOTE — Assessment & Plan Note (Signed)
Controlled, continue Losartan 25mg daily, Atenolol, monitor blood pressure daily. Dr. Brackbill 02/03/14 continue same cardiac meds.    

## 2014-04-22 NOTE — Assessment & Plan Note (Signed)
Chronic venous insufficiency and dermatitis. Stable. Chronic. 1+. Continue Furosemide and Spironolactone. Bun/creat 33/0.90 03/06/14

## 2014-04-29 ENCOUNTER — Other Ambulatory Visit: Payer: Self-pay | Admitting: Nurse Practitioner

## 2014-04-29 DIAGNOSIS — M79605 Pain in left leg: Secondary | ICD-10-CM

## 2014-04-29 MED ORDER — ACETAMINOPHEN 325 MG PO TABS: 650.0000 mg | ORAL_TABLET | ORAL | Status: AC | PRN

## 2014-05-23 ENCOUNTER — Encounter: Payer: Self-pay | Admitting: Nurse Practitioner

## 2014-05-23 ENCOUNTER — Non-Acute Institutional Stay (SKILLED_NURSING_FACILITY): Payer: Medicare Other | Admitting: Nurse Practitioner

## 2014-05-23 DIAGNOSIS — M79605 Pain in left leg: Secondary | ICD-10-CM

## 2014-05-23 DIAGNOSIS — Z9181 History of falling: Secondary | ICD-10-CM

## 2014-05-23 DIAGNOSIS — I4891 Unspecified atrial fibrillation: Secondary | ICD-10-CM

## 2014-05-23 DIAGNOSIS — F3289 Other specified depressive episodes: Secondary | ICD-10-CM

## 2014-05-23 DIAGNOSIS — G250 Essential tremor: Secondary | ICD-10-CM

## 2014-05-23 DIAGNOSIS — I259 Chronic ischemic heart disease, unspecified: Secondary | ICD-10-CM

## 2014-05-23 DIAGNOSIS — L8990 Pressure ulcer of unspecified site, unspecified stage: Secondary | ICD-10-CM

## 2014-05-23 DIAGNOSIS — L8992 Pressure ulcer of unspecified site, stage 2: Secondary | ICD-10-CM

## 2014-05-23 DIAGNOSIS — M545 Low back pain, unspecified: Secondary | ICD-10-CM

## 2014-05-23 DIAGNOSIS — R296 Repeated falls: Secondary | ICD-10-CM

## 2014-05-23 DIAGNOSIS — F32A Depression, unspecified: Secondary | ICD-10-CM

## 2014-05-23 DIAGNOSIS — I480 Paroxysmal atrial fibrillation: Secondary | ICD-10-CM

## 2014-05-23 DIAGNOSIS — E1159 Type 2 diabetes mellitus with other circulatory complications: Secondary | ICD-10-CM

## 2014-05-23 DIAGNOSIS — I10 Essential (primary) hypertension: Secondary | ICD-10-CM

## 2014-05-23 DIAGNOSIS — F03918 Unspecified dementia, unspecified severity, with other behavioral disturbance: Secondary | ICD-10-CM

## 2014-05-23 DIAGNOSIS — F0391 Unspecified dementia with behavioral disturbance: Secondary | ICD-10-CM

## 2014-05-23 DIAGNOSIS — E039 Hypothyroidism, unspecified: Secondary | ICD-10-CM

## 2014-05-23 DIAGNOSIS — F329 Major depressive disorder, single episode, unspecified: Secondary | ICD-10-CM

## 2014-05-23 DIAGNOSIS — G252 Other specified forms of tremor: Secondary | ICD-10-CM

## 2014-05-23 DIAGNOSIS — R609 Edema, unspecified: Secondary | ICD-10-CM

## 2014-05-23 DIAGNOSIS — K5900 Constipation, unspecified: Secondary | ICD-10-CM

## 2014-05-23 DIAGNOSIS — M79609 Pain in unspecified limb: Secondary | ICD-10-CM

## 2014-05-23 NOTE — Assessment & Plan Note (Signed)
No further hypoglycemic episodes since Lantus16 units. Continue bolus Novolog 8 units bid and 12 units with lunch. Hgb A1c 7.8 03/06/14

## 2014-05-23 NOTE — Progress Notes (Signed)
Patient ID: KALLEE NAM, female   DOB: 01-08-1926, 80 y.o.   MRN: [id number]   Code Status: DNR  Allergies  Allergen Reactions  . Penicillins Other (See Comments)    Per MAR  . Bee Venom Other (See Comments)    Per MAR  . Celebrex [Celecoxib] Other (See Comments)    Per MAR  . Lidocaine Hcl Other (See Comments)    Per MAR  . Phenobarbital Other (See Comments)    Per MAR  . Procaine Hcl Other (See Comments)    Per mar    Chief Complaint  Patient presents with  . Medical Management of Chronic Issues  . Acute Visit    right mid intergluteal cleft pressure ulcer.     HPI: Patient is a 80 y.o. female seen in the SNF at [facility] today for evaluation of s/p fall 05/22/14, recurred the right mid intergluteal pressure ulcer, LLE pain, and chronic medical conditions.     ED 01/29/14 for minor head injury and dementia: head CT w/o contrast: no acute findings.  Problem List Items Addressed This Visit   Type II or unspecified type diabetes mellitus with peripheral circulatory disorders, uncontrolled(250.72) (Chronic)     No further hypoglycemic episodes since Lantus16 units. Continue bolus Novolog 8 units bid and 12 units with lunch. Hgb A1c 7.8 03/06/14     Unspecified hypothyroidism (Chronic)     Corrected with Levothyroxine 112.50mcg, last TSH 2.069 08/26/13 and 4.306 03/06/14     Ischemic heart disease     angina occasionally, f/u Cardiology, continue with Imdur 60mg  and Prn NTG(last dose 02/12/13) and ASA 81mg .      Depression     Better with Cymbalta 30mg  bid and Namenda. Observe the patient.        Essential tremor     Mild head titubation and hands tremor-not disabling. Takes Propranolol 10mg  bid      Back pain     Chronic-coccyalgia in nature, pain is controlled with Fentanyl 157mcg/hr       HTN (hypertension)     Controlled, continue Losartan 25mg  daily, Atenolol, monitor blood pressure daily. Dr. Mare Ferrari 02/03/14 continue same cardiac meds.     Unspecified  constipation     Stable, takes colace 100 II qhs and Amitiza 68mcg bid and MiraLax prn.       Falls frequently     05/22/14 lost balance and fell on her right side. Small bruise noted on the right elbow.      Pressure ulcer stage II - Primary     Developed from excoriated area to stage II pressure ulcer at the right mid intergluteal cleft with slight undermining noted at the lateral aspect of wound. Will cleanse the area with soft and water, packing the area with Aqua Gel, then cover with foam dressing. Pressure reduction is warranted.      Edema     Chronic venous insufficiency and dermatitis. Stable. Chronic. 1+. Continue Furosemide and Spironolactone. Bun/creat 30/0.85 04/20/14      PAF (paroxysmal atrial fibrillation)     Rate controlled. Propranolol was increased to 20mg  bid for better rate control in setting of HTN and essential tremor. Saw Dr. Mare Ferrari 02/03/14 doing well, EKG A-fib with controlled VR. 1year office visit EKG      Dementia with behavioral disturbance     Her MMSE 21/3010/04/2013. Given her hx of cardiovascular issues--vascular dementia is more likely. Tolerated  Namenda and increased Cymbalta to 30mg  bid. Mood is better controlled. Gradual decline  in her cognition noted. Lack of safety awareness contributed to her frequent falling.          Left leg pain     till able to bear weight with pain. 04/20/14 Xray L knee/ankle/tibia/fibula: no acute fracture or dislocation. Pain management: Fentanyl for back pain should be cover her right leg/ankle pain. W/c for mobility until LLE pain is better. Otherwise Ortho consult.         Review of Systems:  Review of Systems  Constitutional: Negative for fever, chills, weight loss, malaise/fatigue and diaphoresis.  HENT: Positive for hearing loss. Negative for congestion, ear discharge and sore throat.   Eyes: Negative for pain, discharge and redness.  Respiratory: Negative for cough, sputum production, shortness of breath  and wheezing.   Cardiovascular: Positive for leg swelling and PND. Negative for chest pain, palpitations, orthopnea and claudication.       1+  Gastrointestinal: Negative for heartburn, nausea, vomiting, abdominal pain, diarrhea, constipation and blood in stool.  Genitourinary: Positive for frequency. Negative for dysuria, urgency and flank pain.  Musculoskeletal: Positive for back pain, falls and joint pain. Negative for myalgias and neck pain.       Able to ambulate with walker. Falls 03/05/14, 05/22/14  Skin: Positive for rash (chronic BLE venous dermatitis. Left medial forefoot diabetic foot ulcer). Negative for itching.       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 Intergluteal cleft erythema and beefy red. Recurred the right mid intergluteal cleft pressure ulcer. Left forefoot diabetic ulcer--chronic about a quarter size.  Neurological: Positive for tremors. Negative for dizziness, tingling, sensory change, speech change, focal weakness, seizures, loss of consciousness, weakness and headaches.  Endo/Heme/Allergies: Negative for environmental allergies and polydipsia. Does not bruise/bleed easily.  Psychiatric/Behavioral: Positive for depression and memory loss. Negative for hallucinations. The patient is nervous/anxious and has insomnia.      Past Medical History  Diagnosis Date  . Diabetes mellitus   . Hypertension   . Heart murmur   . Benign familial tremor   . Rosacea   . Diverticulitis   . Heart failure     diastolic heart failure  . TIA (transient ischemic attack) 2010  . Arthritis   . Breast cancer     breast -rt  . CAD (coronary artery disease)     single vessel CAD per cath in 2006; scattered nonobstructive disease in the left system, left to right collaterals to the RCA which was occluded by flush shots; she has been managed medically   Past Surgical History  Procedure Laterality Date  . Mastectomy partial / lumpectomy  11/02/2000    Right, with SLN  . Mastectomy partial /  lumpectomy  05/15/2008    Left  . Breast lumpectomy  01/19/2010    right  . Mastectomy  03/31/2010    Right   Social History:   reports that she has never smoked. She does not have any smokeless tobacco history on file. She reports that she does not drink alcohol or use illicit drugs.  Medications: Patient's Medications  New Prescriptions   No medications on file  Previous Medications   ACETAMINOPHEN (TYLENOL) 325 MG TABLET    Take 2 tablets (650 mg total) by mouth every 4 (four) hours as needed.   ALUM & MAG HYDROXIDE-SIMETH (MAALOX/MYLANTA) 200-200-20 MG/5ML SUSPENSION    Take 30 mLs by mouth every 6 (six) hours as needed for indigestion or heartburn.   ASPIRIN EC 81 MG TABLET    Take 81 mg by mouth  daily.   AYR SALINE NASAL NO-DRIP GEL    Place into the nose 2 (two) times daily as needed (nose bleeds).   DOCUSATE SODIUM (COLACE) 100 MG CAPSULE    Take 200 mg by mouth at bedtime.    DORZOLAMIDE-TIMOLOL (COSOPT) 22.3-6.8 MG/ML OPHTHALMIC SOLUTION    Place 1 drop into the left eye every 12 (twelve) hours.   DULOXETINE (CYMBALTA) 30 MG CAPSULE    Take 30 mg by mouth 2 (two) times daily.   FENTANYL (DURAGESIC - DOSED MCG/HR) 100 MCG/HR    Apply one patch topically every 3 days for pain   FUROSEMIDE (LASIX) 40 MG TABLET    Take 40 mg by mouth 2 (two) times daily.    GLUCAGON (GLUCAGEN) 1 MG SOLR INJECTION    Inject 1 mg into the vein once as needed for low blood sugar.   GLUCOSAMINE-CHONDROITIN 500-400 MG TABLET    Take 1 tablet by mouth 3 (three) times daily.   GUAIFENESIN (ROBITUSSIN) 100 MG/5ML SYRUP    Take 300 mg by mouth 3 (three) times daily as needed for cough.   HYDROXYZINE (ATARAX/VISTARIL) 25 MG TABLET    Take 25 mg by mouth at bedtime as needed. For itching   INSULIN ASPART (NOVOLOG) 100 UNIT/ML INJECTION    Inject 8-12 Units into the skin 3 (three) times daily with meals. 8 units If CBG is greater than 150 at lunch and 12 units if CBG is greater than 150 at breakfast and dinner    INSULIN GLARGINE (LANTUS) 100 UNIT/ML INJECTION    Inject 16 Units into the skin at bedtime.    ISOSORBIDE MONONITRATE (IMDUR) 60 MG 24 HR TABLET    Take 60 mg by mouth every morning.    LEVOTHYROXINE (SYNTHROID, LEVOTHROID) 75 MCG TABLET    Take 75-112.5 mcg by mouth daily. Take 1 and 1/2 tablets on Saturdays then take 1 tablet all the other days   LORATADINE (CLARITIN) 10 MG TABLET    Take 10 mg by mouth daily.    LORAZEPAM (ATIVAN) 0.5 MG TABLET    Take 1 mg by mouth every 12 (twelve) hours as needed. Severe agitation   LOSARTAN (COZAAR) 25 MG TABLET    Take 25 mg by mouth daily.   LUBIPROSTONE (AMITIZA) 24 MCG CAPSULE    Take 24 mcg by mouth 2 (two) times daily with a meal.     MEMANTINE HCL ER (NAMENDA XR) 28 MG CP24    Take by mouth.   METHYLCELLULOSE (ARTIFICIAL TEARS) 1 % OPHTHALMIC SOLUTION    Place 1 drop into both eyes 4 (four) times daily as needed (for dryness).   MULTIPLE VITAMINS-MINERALS (CERTAVITE SENIOR/ANTIOXIDANT PO)    Take 1 tablet by mouth daily.   NITROGLYCERIN (NITROSTAT) 0.4 MG SL TABLET    Place 0.4 mg under the tongue every 5 (five) minutes as needed. For chest pain   NUTRITIONAL SUPPLEMENTS (RESOURCE 2.0 PO)    Take 120 mLs by mouth 3 (three) times daily.   OMEPRAZOLE (PRILOSEC) 20 MG CAPSULE    Take 20 mg by mouth 2 (two) times daily.   PROBIOTIC PRODUCT (ALIGN) 4 MG CAPS    Take 1 capsule by mouth daily.   PROPRANOLOL (INDERAL) 20 MG TABLET    Take 20 mg by mouth 2 (two) times daily.   SPIRONOLACTONE (ALDACTONE) 25 MG TABLET    Take 25 mg by mouth daily.    VITAMIN D, ERGOCALCIFEROL, (DRISDOL) 50000 UNITS CAPS    Take 50,000 Units  by mouth every 30 (thirty) days. On the first of the month  Modified Medications   No medications on file  Discontinued Medications   No medications on file     Physical Exam: Physical Exam  Constitutional: She appears well-developed.  HENT:  Head: Normocephalic and atraumatic.  Left Ear: External ear normal.  Eyes: Conjunctivae  and EOM are normal. Pupils are equal, round, and reactive to light.  Neck: Neck supple. No JVD present. No thyromegaly present.  Cardiovascular: Normal rate and regular rhythm.   Murmur heard. Pulmonary/Chest: Effort normal. She has rales.  Bibasilar rales.   Abdominal: Soft. There is no tenderness. There is no rebound.  Musculoskeletal: She exhibits edema.  Back pain is chronic which is well controlled. BLE chronic edema 1+  Lymphadenopathy:    She has no cervical adenopathy.  Neurological: She is alert. No cranial nerve deficit. Coordination normal.  Skin: Skin is warm and dry. Rash noted.  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 Intergluteal cleft erythema and beefy red.recurred the right mid intergluteal cleft pressure ulcer, stage II, irregular boarder, slight undermining the lateral aspect of wound. Left forefoot diabetic ulcer--chronic about a quarter size. Right mastectomy   Psychiatric: Her mood appears anxious. Her affect is not blunt, not labile and not inappropriate. Her speech is rapid and/or pressured (occasionally ). Her speech is not delayed and not slurred. She is hyperactive. She is not agitated, not aggressive, not slowed, not withdrawn, not actively hallucinating and not combative. Thought content is not paranoid and not delusional. She does not exhibit a depressed mood. She exhibits abnormal recent memory.  MMSE 21/30 08/2013    Filed Vitals:   05/23/14 1225  BP: 124/80  Pulse: 80  Temp: 98.8 F (37.1 C)  TempSrc: Tympanic  Resp: 20      Labs reviewed: Basic Metabolic Panel:  Recent Labs  05/27/13  07/20/13 1921  08/26/13  12/23/13 03/06/14 04/20/14  NA 137  < > 136  < >  --   < > 139 138 136*  K 4.5  < > 3.9  < >  --   < > 3.5 4.6 4.9  CL  --   --  97  --   --   --   --   --   --   CO2  --   --  30  --   --   --   --   --   --   GLUCOSE  --   --  147*  --   --   --   --   --   --   BUN 44*  < > 46*  < >  --   < > 30* 33* 30*  CREATININE 1.3*  < > 1.00  < >   --   < > 1.0 0.9 0.8  CALCIUM  --   --  9.3  --   --   --   --   --   --   TSH 3.96  --   --   --  2.07  --   --  4.31  --   < > = values in this interval not displayed. Liver Function Tests:  Recent Labs  07/20/13 1921  11/21/13 03/06/14 04/20/14  AST 19  < > 17 20 19   ALT 12  < > 9 8 12   ALKPHOS 62  < > 67 85 79  BILITOT 0.3  --   --   --   --  PROT 7.0  --   --   --   --   ALBUMIN 2.8*  --   --   --   --   < > = values in this interval not displayed. CBC:  Recent Labs  07/20/13 1921  12/23/13 03/06/14 04/20/14  WBC 9.3  < > 6.7 5.8 6.4  NEUTROABS 6.5  --   --   --   --   HGB 13.8  < > 13.3 13.4 14.3  HCT 39.7  < > 40 41 42  MCV 97.1  --   --   --   --   PLT 299  < > 268 214 260  < > = values in this interval not displayed.  Past Procedures:  10/22/13 CXR patchy interstitial changes left lower lung most consistent with pneumonia  11/21/13 CXR minimal cardiomegaly slightly increased without pulmonary vascular congestion or pleural effusion, no inflammatory consolidate or suspicious nodule, interval resolution of interstitial pneumonitis at the left lower lung.   01/29/14 for minor head injury and dementia: head CT w/o contrast: no acute findings.   04/18/14 X-ray L knee/ankle/tibia/fibula: no acute fracture or dislocation  Assessment/Plan Pressure ulcer stage II Developed from excoriated area to stage II pressure ulcer at the right mid intergluteal cleft with slight undermining noted at the lateral aspect of wound. Will cleanse the area with soft and water, packing the area with Aqua Gel, then cover with foam dressing. Pressure reduction is warranted.    Edema Chronic venous insufficiency and dermatitis. Stable. Chronic. 1+. Continue Furosemide and Spironolactone. Bun/creat 30/0.85 04/20/14    Left leg pain till able to bear weight with pain. 04/20/14 Xray L knee/ankle/tibia/fibula: no acute fracture or dislocation. Pain management: Fentanyl for back pain should be cover her  right leg/ankle pain. W/c for mobility until LLE pain is better. Otherwise Ortho consult.    Dementia with behavioral disturbance Her MMSE 21/3010/04/2013. Given her hx of cardiovascular issues--vascular dementia is more likely. Tolerated  Namenda and increased Cymbalta to 30mg  bid. Mood is better controlled. Gradual decline in her cognition noted. Lack of safety awareness contributed to her frequent falling.        PAF (paroxysmal atrial fibrillation) Rate controlled. Propranolol was increased to 20mg  bid for better rate control in setting of HTN and essential tremor. Saw Dr. Mare Ferrari 02/03/14 doing well, EKG A-fib with controlled VR. 1year office visit EKG    Falls frequently 05/22/14 lost balance and fell on her right side. Small bruise noted on the right elbow.    Unspecified constipation Stable, takes colace 100 II qhs and Amitiza 74mcg bid and MiraLax prn.     HTN (hypertension) Controlled, continue Losartan 25mg  daily, Atenolol, monitor blood pressure daily. Dr. Mare Ferrari 02/03/14 continue same cardiac meds.   Back pain Chronic-coccyalgia in nature, pain is controlled with Fentanyl 163mcg/hr     Essential tremor Mild head titubation and hands tremor-not disabling. Takes Propranolol 10mg  bid    Depression Better with Cymbalta 30mg  bid and Namenda. Observe the patient.      Ischemic heart disease angina occasionally, f/u Cardiology, continue with Imdur 60mg  and Prn NTG(last dose 02/12/13) and ASA 81mg .    Unspecified hypothyroidism Corrected with Levothyroxine 112.9mcg, last TSH 2.069 08/26/13 and 4.306 03/06/14   Type II or unspecified type diabetes mellitus with peripheral circulatory disorders, uncontrolled(250.72) No further hypoglycemic episodes since Lantus16 units. Continue bolus Novolog 8 units bid and 12 units with lunch. Hgb A1c 7.8 03/06/14     Family/ Staff  Communication: observe the patient.   Goals of Care: SNF  Labs/tests ordered:  none

## 2014-05-23 NOTE — Assessment & Plan Note (Signed)
Developed from excoriated area to stage II pressure ulcer at the right mid intergluteal cleft with slight undermining noted at the lateral aspect of wound. Will cleanse the area with soft and water, packing the area with Aqua Gel, then cover with foam dressing. Pressure reduction is warranted.

## 2014-05-23 NOTE — Assessment & Plan Note (Signed)
05/22/14 lost balance and fell on her right side. Small bruise noted on the right elbow.

## 2014-05-23 NOTE — Assessment & Plan Note (Signed)
Her MMSE 21/3010/04/2013. Given her hx of cardiovascular issues--vascular dementia is more likely. Tolerated  Namenda and increased Cymbalta to 30mg bid. Mood is better controlled. Gradual decline in her cognition noted. Lack of safety awareness contributed to her frequent falling.    

## 2014-05-23 NOTE — Assessment & Plan Note (Signed)
Corrected with Levothyroxine 112.58mcg, last TSH 2.069 08/26/13 and 4.306 03/06/14

## 2014-05-23 NOTE — Assessment & Plan Note (Signed)
Chronic-coccyalgia in nature, pain is controlled with Fentanyl 100mcg/hr   

## 2014-05-23 NOTE — Assessment & Plan Note (Signed)
till able to bear weight with pain. 04/20/14 Xray L knee/ankle/tibia/fibula: no acute fracture or dislocation. Pain management: Fentanyl for back pain should be cover her right leg/ankle pain. W/c for mobility until LLE pain is better. Otherwise Ortho consult.

## 2014-05-23 NOTE — Assessment & Plan Note (Signed)
Stable, takes colace 100 II qhs and Amitiza 71mcg bid and MiraLax prn.

## 2014-05-23 NOTE — Assessment & Plan Note (Signed)
Controlled, continue Losartan 25mg daily, Atenolol, monitor blood pressure daily. Dr. Brackbill 02/03/14 continue same cardiac meds.    

## 2014-05-23 NOTE — Assessment & Plan Note (Signed)
Rate controlled. Propranolol was increased to 20mg bid for better rate control in setting of HTN and essential tremor. Saw Dr. Brackbill 02/03/14 doing well, EKG A-fib with controlled VR. 1year office visit EKG  

## 2014-05-23 NOTE — Assessment & Plan Note (Signed)
Mild head titubation and hands tremor-not disabling. Takes Propranolol 10mg bid  

## 2014-05-23 NOTE — Assessment & Plan Note (Signed)
angina occasionally, f/u Cardiology, continue with Imdur 60mg and Prn NTG(last dose 02/12/13) and ASA 81mg.    

## 2014-05-23 NOTE — Assessment & Plan Note (Signed)
Chronic venous insufficiency and dermatitis. Stable. Chronic. 1+. Continue Furosemide and Spironolactone. Bun/creat 30/0.85 04/20/14

## 2014-05-23 NOTE — Assessment & Plan Note (Signed)
Better with Cymbalta 30mg  bid and Namenda. Observe the patient.

## 2014-06-10 ENCOUNTER — Encounter: Payer: Self-pay | Admitting: Nurse Practitioner

## 2014-06-10 ENCOUNTER — Non-Acute Institutional Stay (SKILLED_NURSING_FACILITY): Payer: Medicare Other | Admitting: Nurse Practitioner

## 2014-06-10 DIAGNOSIS — E1159 Type 2 diabetes mellitus with other circulatory complications: Secondary | ICD-10-CM

## 2014-06-10 DIAGNOSIS — I259 Chronic ischemic heart disease, unspecified: Secondary | ICD-10-CM

## 2014-06-10 DIAGNOSIS — L8990 Pressure ulcer of unspecified site, unspecified stage: Secondary | ICD-10-CM

## 2014-06-10 DIAGNOSIS — M545 Low back pain, unspecified: Secondary | ICD-10-CM

## 2014-06-10 DIAGNOSIS — R159 Full incontinence of feces: Secondary | ICD-10-CM

## 2014-06-10 DIAGNOSIS — F3289 Other specified depressive episodes: Secondary | ICD-10-CM

## 2014-06-10 DIAGNOSIS — I4891 Unspecified atrial fibrillation: Secondary | ICD-10-CM

## 2014-06-10 DIAGNOSIS — I482 Chronic atrial fibrillation, unspecified: Secondary | ICD-10-CM

## 2014-06-10 DIAGNOSIS — E039 Hypothyroidism, unspecified: Secondary | ICD-10-CM

## 2014-06-10 DIAGNOSIS — F03918 Unspecified dementia, unspecified severity, with other behavioral disturbance: Secondary | ICD-10-CM

## 2014-06-10 DIAGNOSIS — L8992 Pressure ulcer of unspecified site, stage 2: Secondary | ICD-10-CM

## 2014-06-10 DIAGNOSIS — K5900 Constipation, unspecified: Secondary | ICD-10-CM

## 2014-06-10 DIAGNOSIS — F0391 Unspecified dementia with behavioral disturbance: Secondary | ICD-10-CM

## 2014-06-10 DIAGNOSIS — F32A Depression, unspecified: Secondary | ICD-10-CM

## 2014-06-10 DIAGNOSIS — F329 Major depressive disorder, single episode, unspecified: Secondary | ICD-10-CM

## 2014-06-10 DIAGNOSIS — I10 Essential (primary) hypertension: Secondary | ICD-10-CM

## 2014-06-10 DIAGNOSIS — R609 Edema, unspecified: Secondary | ICD-10-CM

## 2014-06-10 NOTE — Assessment & Plan Note (Signed)
Stable, takes colace 100 II qhs and Amitiza 19mcg bid and MiraLax prn.

## 2014-06-10 NOTE — Assessment & Plan Note (Signed)
Rate controlled. Propranolol was increased to 20mg bid for better rate control in setting of HTN and essential tremor. Saw Dr. Brackbill 02/03/14 doing well, EKG A-fib with controlled VR. 1year office visit EKG  

## 2014-06-10 NOTE — Assessment & Plan Note (Signed)
Controlled, continue Losartan 25mg daily, Atenolol, monitor blood pressure daily. Dr. Brackbill 02/03/14 continue same cardiac meds.    

## 2014-06-10 NOTE — Assessment & Plan Note (Signed)
R+L Intergluteal cleft recurrent pressure ulcers in mirror image about a quarter size.  Left forefoot diabetic ulcer--chronic about a quarter size. Excoriated urogenital/buttocks/perinum. Will assist the patient to cleanse urogenital, buttocks, and perineum area q4h, then pat dry, then apply heavy layer of barrier ointment. Encourage the patient to be off pressure of her buttocks by bedresting side to side between meals.

## 2014-06-10 NOTE — Assessment & Plan Note (Signed)
Corrected with Levothyroxine 112.34mcg, last TSH 2.069 08/26/13 and 4.306 03/06/14

## 2014-06-10 NOTE — Progress Notes (Signed)
Patient ID: [name], female   DOB: [birth year], 79 y.o.   MRN: [id number]   Code Status: DNR  Allergies  Allergen Reactions  . Penicillins Other (See Comments)    Per MAR  . Bee Venom Other (See Comments)    Per MAR  . Celebrex [Celecoxib] Other (See Comments)    Per MAR  . Lidocaine Hcl Other (See Comments)    Per MAR  . Phenobarbital Other (See Comments)    Per MAR  . Procaine Hcl Other (See Comments)    Per mar    Chief Complaint  Patient presents with  . Medical Management of Chronic Issues  . Acute Visit    escoriated urogenital-buttocks-perineum.     HPI: Patient is a 80 y.o. female seen in the SNF at [facility] today for evaluation of excoriated urogenital/perinum/buttocks,  recurred the right and left mid intergluteal cleft pressure ulcers  and chronic medical conditions.     ED 01/29/14 for minor head injury and dementia: head CT w/o contrast: no acute findings.  Problem List Items Addressed This Visit   Type II or unspecified type diabetes mellitus with peripheral circulatory disorders, uncontrolled(250.72) (Chronic)     No further hypoglycemic episodes since Lantus16 units. Continue bolus Novolog 8 units bid and 12 units with lunch. Hgb A1c 7.8 03/06/14       Unspecified hypothyroidism (Chronic)     Corrected with Levothyroxine 112.4mcg, last TSH 2.069 08/26/13 and 4.306 03/06/14     Ischemic heart disease     angina occasionally, f/u Cardiology, continue with Imdur 60mg  and Prn NTG(last dose 02/12/13) and ASA 81mg .       Depression     Better with Cymbalta 30mg  bid and Namenda. Observe the patient.       Back pain     Chronic-coccyalgia in nature, pain is controlled with Fentanyl 180mcg/hr        HTN (hypertension)     Controlled, continue Losartan 25mg  daily, Atenolol, monitor blood pressure daily. Dr. Mare Ferrari 02/03/14 continue same cardiac meds.      Unspecified constipation     Stable, takes colace 100 II qhs and Amitiza 93mcg bid and  MiraLax prn.      Pressure ulcer stage II - Primary     R+L Intergluteal cleft recurrent pressure ulcers in mirror image about a quarter size.  Left forefoot diabetic ulcer--chronic about a quarter size. Excoriated urogenital/buttocks/perinum. Will assist the patient to cleanse urogenital, buttocks, and perineum area q4h, then pat dry, then apply heavy layer of barrier ointment. Encourage the patient to be off pressure of her buttocks by bedresting side to side between meals.       Edema     Chronic venous insufficiency and dermatitis. Stable. Chronic. 1+. Continue Furosemide and Spironolactone. Bun/creat 30/0.85 04/20/14      A-fib     Rate controlled. Propranolol was increased to 20mg  bid for better rate control in setting of HTN and essential tremor. Saw Dr. Mare Ferrari 02/03/14 doing well, EKG A-fib with controlled VR. 1year office visit EKG       Dementia with behavioral disturbance     Her MMSE 21/3010/04/2013. Given her hx of cardiovascular issues--vascular dementia is more likely. Tolerated  Namenda and increased Cymbalta to 30mg  bid. Mood is better controlled. Gradual decline in her cognition noted. Lack of safety awareness contributed to her frequent falling.        Fecal incontinence     Incontinent care q4h until pressure ulcers/excoriated buttocks healed.  Review of Systems:  Review of Systems  Constitutional: Negative for fever, chills, weight loss, malaise/fatigue and diaphoresis.  HENT: Positive for hearing loss. Negative for congestion, ear discharge and sore throat.   Eyes: Negative for pain, discharge and redness.  Respiratory: Negative for cough, sputum production, shortness of breath and wheezing.   Cardiovascular: Positive for leg swelling and PND. Negative for chest pain, palpitations, orthopnea and claudication.       1+  Gastrointestinal: Negative for heartburn, nausea, vomiting, abdominal pain, diarrhea, constipation and blood in stool.       Fecal  incontinence  Genitourinary: Positive for frequency. Negative for dysuria, urgency and flank pain.       Leakage.   Musculoskeletal: Positive for back pain, falls and joint pain. Negative for myalgias and neck pain.       Able to ambulate with walker. Falls 03/05/14, 05/22/14  Skin: Positive for rash (chronic BLE venous dermatitis. Left medial forefoot diabetic foot ulcer). Negative for itching.       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 R+L Intergluteal cleft recurrent pressure ulcers in mirror image about a quarter size.  Left forefoot diabetic ulcer--chronic about a quarter size. Excoriated urogenital/buttocks/perinum.   Neurological: Positive for tremors. Negative for dizziness, tingling, sensory change, speech change, focal weakness, seizures, loss of consciousness, weakness and headaches.  Endo/Heme/Allergies: Negative for environmental allergies and polydipsia. Does not bruise/bleed easily.  Psychiatric/Behavioral: Positive for depression and memory loss. Negative for hallucinations. The patient is nervous/anxious and has insomnia.      Past Medical History  Diagnosis Date  . Diabetes mellitus   . Hypertension   . Heart murmur   . Benign familial tremor   . Rosacea   . Diverticulitis   . Heart failure     diastolic heart failure  . TIA (transient ischemic attack) 2010  . Arthritis   . Breast cancer     breast -rt  . CAD (coronary artery disease)     single vessel CAD per cath in 2006; scattered nonobstructive disease in the left system, left to right collaterals to the RCA which was occluded by flush shots; she has been managed medically   Past Surgical History  Procedure Laterality Date  . Mastectomy partial / lumpectomy  11/02/2000    Right, with SLN  . Mastectomy partial / lumpectomy  05/15/2008    Left  . Breast lumpectomy  01/19/2010    right  . Mastectomy  03/31/2010    Right   Social History:   reports that she has never smoked. She does not have any smokeless tobacco  history on file. She reports that she does not drink alcohol or use illicit drugs.  Medications: Patient's Medications  New Prescriptions   No medications on file  Previous Medications   ACETAMINOPHEN (TYLENOL) 325 MG TABLET    Take 2 tablets (650 mg total) by mouth every 4 (four) hours as needed.   ALUM & MAG HYDROXIDE-SIMETH (MAALOX/MYLANTA) 200-200-20 MG/5ML SUSPENSION    Take 30 mLs by mouth every 6 (six) hours as needed for indigestion or heartburn.   ASPIRIN EC 81 MG TABLET    Take 81 mg by mouth daily.   AYR SALINE NASAL NO-DRIP GEL    Place into the nose 2 (two) times daily as needed (nose bleeds).   DOCUSATE SODIUM (COLACE) 100 MG CAPSULE    Take 200 mg by mouth at bedtime.    DORZOLAMIDE-TIMOLOL (COSOPT) 22.3-6.8 MG/ML OPHTHALMIC SOLUTION    Place 1 drop into the  left eye every 12 (twelve) hours.   DULOXETINE (CYMBALTA) 30 MG CAPSULE    Take 30 mg by mouth 2 (two) times daily.   FENTANYL (DURAGESIC - DOSED MCG/HR) 100 MCG/HR    Apply one patch topically every 3 days for pain   FUROSEMIDE (LASIX) 40 MG TABLET    Take 40 mg by mouth 2 (two) times daily.    GLUCAGON (GLUCAGEN) 1 MG SOLR INJECTION    Inject 1 mg into the vein once as needed for low blood sugar.   GLUCOSAMINE-CHONDROITIN 500-400 MG TABLET    Take 1 tablet by mouth 3 (three) times daily.   GUAIFENESIN (ROBITUSSIN) 100 MG/5ML SYRUP    Take 300 mg by mouth 3 (three) times daily as needed for cough.   HYDROXYZINE (ATARAX/VISTARIL) 25 MG TABLET    Take 25 mg by mouth at bedtime as needed. For itching   INSULIN ASPART (NOVOLOG) 100 UNIT/ML INJECTION    Inject 8-12 Units into the skin 3 (three) times daily with meals. 8 units If CBG is greater than 150 at lunch and 12 units if CBG is greater than 150 at breakfast and dinner   INSULIN GLARGINE (LANTUS) 100 UNIT/ML INJECTION    Inject 16 Units into the skin at bedtime.    ISOSORBIDE MONONITRATE (IMDUR) 60 MG 24 HR TABLET    Take 60 mg by mouth every morning.    LEVOTHYROXINE  (SYNTHROID, LEVOTHROID) 75 MCG TABLET    Take 75-112.5 mcg by mouth daily. Take 1 and 1/2 tablets on Saturdays then take 1 tablet all the other days   LORATADINE (CLARITIN) 10 MG TABLET    Take 10 mg by mouth daily.    LORAZEPAM (ATIVAN) 0.5 MG TABLET    Take 1 mg by mouth every 12 (twelve) hours as needed. Severe agitation   LOSARTAN (COZAAR) 25 MG TABLET    Take 25 mg by mouth daily.   LUBIPROSTONE (AMITIZA) 24 MCG CAPSULE    Take 24 mcg by mouth 2 (two) times daily with a meal.     MEMANTINE HCL ER (NAMENDA XR) 28 MG CP24    Take by mouth.   METHYLCELLULOSE (ARTIFICIAL TEARS) 1 % OPHTHALMIC SOLUTION    Place 1 drop into both eyes 4 (four) times daily as needed (for dryness).   MULTIPLE VITAMINS-MINERALS (CERTAVITE SENIOR/ANTIOXIDANT PO)    Take 1 tablet by mouth daily.   NITROGLYCERIN (NITROSTAT) 0.4 MG SL TABLET    Place 0.4 mg under the tongue every 5 (five) minutes as needed. For chest pain   NUTRITIONAL SUPPLEMENTS (RESOURCE 2.0 PO)    Take 120 mLs by mouth 3 (three) times daily.   OMEPRAZOLE (PRILOSEC) 20 MG CAPSULE    Take 20 mg by mouth 2 (two) times daily.   PROBIOTIC PRODUCT (ALIGN) 4 MG CAPS    Take 1 capsule by mouth daily.   PROPRANOLOL (INDERAL) 20 MG TABLET    Take 20 mg by mouth 2 (two) times daily.   SPIRONOLACTONE (ALDACTONE) 25 MG TABLET    Take 25 mg by mouth daily.    VITAMIN D, ERGOCALCIFEROL, (DRISDOL) 50000 UNITS CAPS    Take 50,000 Units by mouth every 30 (thirty) days. On the first of the month  Modified Medications   No medications on file  Discontinued Medications   No medications on file     Physical Exam: Physical Exam  Constitutional: She appears well-developed.  HENT:  Head: Normocephalic and atraumatic.  Left Ear: External ear normal.  Eyes:  Conjunctivae and EOM are normal. Pupils are equal, round, and reactive to light.  Neck: Neck supple. No JVD present. No thyromegaly present.  Cardiovascular: Normal rate and regular rhythm.   Murmur  heard. Pulmonary/Chest: Effort normal. She has rales.  Bibasilar rales.   Abdominal: Soft. There is no tenderness. There is no rebound.  Musculoskeletal: She exhibits edema.  Back pain is chronic which is well controlled. BLE chronic edema 1+  Lymphadenopathy:    She has no cervical adenopathy.  Neurological: She is alert. No cranial nerve deficit. Coordination normal.  Skin: Skin is warm and dry. Rash noted.  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 R+L Intergluteal cleft recurrent pressure ulcers in mirror image about a quarter size.  Left forefoot diabetic ulcer--chronic about a quarter size. Excoriated urogenital/buttocks/perinum.    Psychiatric: Her mood appears anxious. Her affect is not blunt, not labile and not inappropriate. Her speech is rapid and/or pressured (occasionally ). Her speech is not delayed and not slurred. She is hyperactive. She is not agitated, not aggressive, not slowed, not withdrawn, not actively hallucinating and not combative. Thought content is not paranoid and not delusional. She does not exhibit a depressed mood. She exhibits abnormal recent memory.  MMSE 21/30 08/2013    Filed Vitals:   06/10/14 1335  BP: 130/80  Pulse: 72  Temp: 98.9 F (37.2 C)  TempSrc: Tympanic  Resp: 20      Labs reviewed: Basic Metabolic Panel:  Recent Labs  07/20/13 1921  08/26/13  12/23/13 03/06/14 04/20/14  NA 136  < >  --   < > 139 138 136*  K 3.9  < >  --   < > 3.5 4.6 4.9  CL 97  --   --   --   --   --   --   CO2 30  --   --   --   --   --   --   GLUCOSE 147*  --   --   --   --   --   --   BUN 46*  < >  --   < > 30* 33* 30*  CREATININE 1.00  < >  --   < > 1.0 0.9 0.8  CALCIUM 9.3  --   --   --   --   --   --   TSH  --   --  2.07  --   --  4.31  --   < > = values in this interval not displayed. Liver Function Tests:  Recent Labs  07/20/13 1921  11/21/13 03/06/14 04/20/14  AST 19  < > 17 20 19   ALT 12  < > 9 8 12   ALKPHOS 62  < > 67 85 79  BILITOT 0.3  --   --    --   --   PROT 7.0  --   --   --   --   ALBUMIN 2.8*  --   --   --   --   < > = values in this interval not displayed. CBC:  Recent Labs  07/20/13 1921  12/23/13 03/06/14 04/20/14  WBC 9.3  < > 6.7 5.8 6.4  NEUTROABS 6.5  --   --   --   --   HGB 13.8  < > 13.3 13.4 14.3  HCT 39.7  < > 40 41 42  MCV 97.1  --   --   --   --   PLT 299  < >  268 214 260  < > = values in this interval not displayed.  Past Procedures:  10/22/13 CXR patchy interstitial changes left lower lung most consistent with pneumonia  11/21/13 CXR minimal cardiomegaly slightly increased without pulmonary vascular congestion or pleural effusion, no inflammatory consolidate or suspicious nodule, interval resolution of interstitial pneumonitis at the left lower lung.   01/29/14 for minor head injury and dementia: head CT w/o contrast: no acute findings.   04/18/14 X-ray L knee/ankle/tibia/fibula: no acute fracture or dislocation  Assessment/Plan Pressure ulcer stage II R+L Intergluteal cleft recurrent pressure ulcers in mirror image about a quarter size.  Left forefoot diabetic ulcer--chronic about a quarter size. Excoriated urogenital/buttocks/perinum. Will assist the patient to cleanse urogenital, buttocks, and perineum area q4h, then pat dry, then apply heavy layer of barrier ointment. Encourage the patient to be off pressure of her buttocks by bedresting side to side between meals.     Fecal incontinence Incontinent care q4h until pressure ulcers/excoriated buttocks healed.   Type II or unspecified type diabetes mellitus with peripheral circulatory disorders, uncontrolled(250.72) No further hypoglycemic episodes since Lantus16 units. Continue bolus Novolog 8 units bid and 12 units with lunch. Hgb A1c 7.8 03/06/14     Back pain Chronic-coccyalgia in nature, pain is controlled with Fentanyl 162mcg/hr      Unspecified hypothyroidism Corrected with Levothyroxine 112.45mcg, last TSH 2.069 08/26/13 and 4.306  03/06/14   Depression Better with Cymbalta 30mg  bid and Namenda. Observe the patient.     Dementia with behavioral disturbance Her MMSE 21/3010/04/2013. Given her hx of cardiovascular issues--vascular dementia is more likely. Tolerated  Namenda and increased Cymbalta to 30mg  bid. Mood is better controlled. Gradual decline in her cognition noted. Lack of safety awareness contributed to her frequent falling.      Ischemic heart disease angina occasionally, f/u Cardiology, continue with Imdur 60mg  and Prn NTG(last dose 02/12/13) and ASA 81mg .     HTN (hypertension) Controlled, continue Losartan 25mg  daily, Atenolol, monitor blood pressure daily. Dr. Mare Ferrari 02/03/14 continue same cardiac meds.    Edema Chronic venous insufficiency and dermatitis. Stable. Chronic. 1+. Continue Furosemide and Spironolactone. Bun/creat 30/0.85 04/20/14    A-fib Rate controlled. Propranolol was increased to 20mg  bid for better rate control in setting of HTN and essential tremor. Saw Dr. Mare Ferrari 02/03/14 doing well, EKG A-fib with controlled VR. 1year office visit EKG     Unspecified constipation Stable, takes colace 100 II qhs and Amitiza 19mcg bid and MiraLax prn.      Family/ Staff Communication: observe the patient.   Goals of Care: SNF  Labs/tests ordered: none

## 2014-06-10 NOTE — Assessment & Plan Note (Signed)
No further hypoglycemic episodes since Lantus16 units. Continue bolus Novolog 8 units bid and 12 units with lunch. Hgb A1c 7.8 03/06/14

## 2014-06-10 NOTE — Assessment & Plan Note (Signed)
Incontinent care q4h until pressure ulcers/excoriated buttocks healed.

## 2014-06-10 NOTE — Assessment & Plan Note (Signed)
angina occasionally, f/u Cardiology, continue with Imdur 60mg and Prn NTG(last dose 02/12/13) and ASA 81mg.    

## 2014-06-10 NOTE — Assessment & Plan Note (Signed)
Her MMSE 21/3010/04/2013. Given her hx of cardiovascular issues--vascular dementia is more likely. Tolerated  Namenda and increased Cymbalta to 30mg bid. Mood is better controlled. Gradual decline in her cognition noted. Lack of safety awareness contributed to her frequent falling.    

## 2014-06-10 NOTE — Assessment & Plan Note (Signed)
Better with Cymbalta 30mg  bid and Namenda. Observe the patient.

## 2014-06-10 NOTE — Assessment & Plan Note (Signed)
Chronic venous insufficiency and dermatitis. Stable. Chronic. 1+. Continue Furosemide and Spironolactone. Bun/creat 30/0.85 04/20/14

## 2014-06-10 NOTE — Assessment & Plan Note (Signed)
Chronic-coccyalgia in nature, pain is controlled with Fentanyl 100mcg/hr   

## 2014-06-24 ENCOUNTER — Other Ambulatory Visit: Payer: Self-pay | Admitting: Nurse Practitioner

## 2014-06-24 DIAGNOSIS — F32A Depression, unspecified: Secondary | ICD-10-CM

## 2014-06-24 DIAGNOSIS — F329 Major depressive disorder, single episode, unspecified: Secondary | ICD-10-CM

## 2014-07-01 ENCOUNTER — Encounter: Payer: Self-pay | Admitting: Nurse Practitioner

## 2014-07-01 ENCOUNTER — Non-Acute Institutional Stay (SKILLED_NURSING_FACILITY): Payer: Medicare Other | Admitting: Nurse Practitioner

## 2014-07-01 DIAGNOSIS — G250 Essential tremor: Secondary | ICD-10-CM

## 2014-07-01 DIAGNOSIS — Z9181 History of falling: Secondary | ICD-10-CM

## 2014-07-01 DIAGNOSIS — I259 Chronic ischemic heart disease, unspecified: Secondary | ICD-10-CM

## 2014-07-01 DIAGNOSIS — F3289 Other specified depressive episodes: Secondary | ICD-10-CM

## 2014-07-01 DIAGNOSIS — F03918 Unspecified dementia, unspecified severity, with other behavioral disturbance: Secondary | ICD-10-CM

## 2014-07-01 DIAGNOSIS — M79609 Pain in unspecified limb: Secondary | ICD-10-CM

## 2014-07-01 DIAGNOSIS — I4891 Unspecified atrial fibrillation: Secondary | ICD-10-CM

## 2014-07-01 DIAGNOSIS — E039 Hypothyroidism, unspecified: Secondary | ICD-10-CM

## 2014-07-01 DIAGNOSIS — K5900 Constipation, unspecified: Secondary | ICD-10-CM

## 2014-07-01 DIAGNOSIS — I482 Chronic atrial fibrillation, unspecified: Secondary | ICD-10-CM

## 2014-07-01 DIAGNOSIS — F32A Depression, unspecified: Secondary | ICD-10-CM

## 2014-07-01 DIAGNOSIS — F329 Major depressive disorder, single episode, unspecified: Secondary | ICD-10-CM

## 2014-07-01 DIAGNOSIS — K3189 Other diseases of stomach and duodenum: Secondary | ICD-10-CM

## 2014-07-01 DIAGNOSIS — E1159 Type 2 diabetes mellitus with other circulatory complications: Secondary | ICD-10-CM

## 2014-07-01 DIAGNOSIS — M7989 Other specified soft tissue disorders: Secondary | ICD-10-CM

## 2014-07-01 DIAGNOSIS — M79605 Pain in left leg: Secondary | ICD-10-CM

## 2014-07-01 DIAGNOSIS — F0391 Unspecified dementia with behavioral disturbance: Secondary | ICD-10-CM

## 2014-07-01 DIAGNOSIS — G252 Other specified forms of tremor: Secondary | ICD-10-CM

## 2014-07-01 DIAGNOSIS — R1013 Epigastric pain: Secondary | ICD-10-CM

## 2014-07-01 DIAGNOSIS — I10 Essential (primary) hypertension: Secondary | ICD-10-CM

## 2014-07-01 DIAGNOSIS — R296 Repeated falls: Secondary | ICD-10-CM

## 2014-07-01 NOTE — Progress Notes (Signed)
Patient ID: [name], female   DOB: 04/16/1926, 80 y.o.   MRN: [id number]   Code Status: DNR  Allergies  Allergen Reactions  . Penicillins Other (See Comments)    Per MAR  . Bee Venom Other (See Comments)    Per MAR  . Celebrex [Celecoxib] Other (See Comments)    Per MAR  . Lidocaine Hcl Other (See Comments)    Per MAR  . Phenobarbital Other (See Comments)    Per MAR  . Procaine Hcl Other (See Comments)    Per mar    Chief Complaint  Patient presents with  . Medical Management of Chronic Issues  . Acute Visit    blood sugar    HPI: Patient is a 80 y.o. female seen in the SNF at [facility] today for evaluation of s/p fall 06/29/14, blood sugar, and chronic medical conditions.     ED 01/29/14 for minor head injury and dementia: head CT w/o contrast: no acute findings.  Problem List Items Addressed This Visit   A-fib     Rate controlled. Propranolol was increased to 20mg  bid for better rate control in setting of HTN and essential tremor. Saw Dr. Mare Ferrari 02/03/14 doing well, EKG A-fib with controlled VR. 1year office visit EKG       Dementia with behavioral disturbance     Her MMSE 21/3010/04/2013. Given her hx of cardiovascular issues--vascular dementia is more likely. Tolerated  Namenda and increased Cymbalta to 30mg  bid. Mood is better controlled. Gradual decline in her cognition noted. Lack of safety awareness contributed to her frequent falling.       Depression     Better with Cymbalta 30mg  bid and Namenda. Observe the patient.       Dyspepsia     Stable, takes Omeprazole 20mg  bid.     Essential tremor     Mild head titubation and hands tremor-not disabling. Takes Propranolol 10mg  bid      Falls frequently - Primary     06/29/14 Fell-walking to BR and fell backward. Lack of safety awareness and increase frailty contributed to her falling.      HTN (hypertension)     Controlled, continue Losartan 25mg  daily, Atenolol, monitor blood pressure daily. Dr.  Mare Ferrari 02/03/14 continue same cardiac meds.       Ischemic heart disease     angina occasionally, f/u Cardiology, continue with Imdur 60mg  and Prn NTG(last dose 02/12/13) and ASA 81mg .        Left leg pain     still able to bear weight with pain. 04/20/14 Xray L knee/ankle/tibia/fibula: no acute fracture or dislocation. Pain management: Fentanyl for back pain should be cover her right leg/ankle pain. W/c for mobility until LLE pain is better. Otherwise Ortho consult.       Leg swelling     Chronic venous insufficiency and dermatitis. Stable. Chronic. 1+. Continue Furosemide and Spironolactone. Bun/creat 30/0.85 04/20/14. Update BMP     Type II or unspecified type diabetes mellitus with peripheral circulatory disorders, uncontrolled(250.72) (Chronic)     No further hypoglycemic episodes since Lantus16 units. Continue bolus Novolog 8 units bid and 12 units with lunch. Hgb A1c 7.8 03/06/14. CBGs am 88-230, noon 227-316, pm 91-261. Update Hgb A1c.        Unspecified constipation     Stable, takes colace 100 II qhs and Linzess(off Amitiza 55mcg bid-therapeutic exchange by Pharm )and MiraLax prn.      Unspecified hypothyroidism (Chronic)     Corrected with  Levothyroxine 112.50mcg, last TSH 2.069 08/26/13 and 4.306 03/06/14. Update TSH         Review of Systems:  Review of Systems  Constitutional: Negative for fever, chills, weight loss, malaise/fatigue and diaphoresis.  HENT: Positive for hearing loss. Negative for congestion, ear discharge and sore throat.   Eyes: Negative for pain, discharge and redness.  Respiratory: Negative for cough, sputum production, shortness of breath and wheezing.   Cardiovascular: Positive for leg swelling and PND. Negative for chest pain, palpitations, orthopnea and claudication.       1+  Gastrointestinal: Negative for heartburn, nausea, vomiting, abdominal pain, diarrhea, constipation and blood in stool.       Fecal incontinence  Genitourinary:  Positive for frequency. Negative for dysuria, urgency and flank pain.       Leakage.   Musculoskeletal: Positive for back pain, falls and joint pain. Negative for myalgias and neck pain.       Able to ambulate with walker. Falls 03/05/14, 05/22/14, 06/29/14  Skin: Positive for rash (chronic BLE venous dermatitis. Left medial forefoot diabetic foot ulcer). Negative for itching.       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 R+L Intergluteal cleft recurrent pressure ulcers in mirror image about a quarter size.  Left forefoot diabetic ulcer--chronic about a quarter size. Excoriated urogenital/buttocks/perinum.   Neurological: Positive for tremors. Negative for dizziness, tingling, sensory change, speech change, focal weakness, seizures, loss of consciousness, weakness and headaches.  Endo/Heme/Allergies: Negative for environmental allergies and polydipsia. Does not bruise/bleed easily.  Psychiatric/Behavioral: Positive for depression and memory loss. Negative for hallucinations. The patient is nervous/anxious and has insomnia.      Past Medical History  Diagnosis Date  . Diabetes mellitus   . Hypertension   . Heart murmur   . Benign familial tremor   . Rosacea   . Diverticulitis   . Heart failure     diastolic heart failure  . TIA (transient ischemic attack) 2010  . Arthritis   . Breast cancer     breast -rt  . CAD (coronary artery disease)     single vessel CAD per cath in 2006; scattered nonobstructive disease in the left system, left to right collaterals to the RCA which was occluded by flush shots; she has been managed medically   Past Surgical History  Procedure Laterality Date  . Mastectomy partial / lumpectomy  11/02/2000    Right, with SLN  . Mastectomy partial / lumpectomy  05/15/2008    Left  . Breast lumpectomy  01/19/2010    right  . Mastectomy  03/31/2010    Right   Social History:   reports that she has never smoked. She does not have any smokeless tobacco history on file. She  reports that she does not drink alcohol or use illicit drugs.  Medications: Patient's Medications  New Prescriptions   No medications on file  Previous Medications   ACETAMINOPHEN (TYLENOL) 325 MG TABLET    Take 2 tablets (650 mg total) by mouth every 4 (four) hours as needed.   ALUM & MAG HYDROXIDE-SIMETH (MAALOX/MYLANTA) 200-200-20 MG/5ML SUSPENSION    Take 30 mLs by mouth every 6 (six) hours as needed for indigestion or heartburn.   ASPIRIN EC 81 MG TABLET    Take 81 mg by mouth daily.   AYR SALINE NASAL NO-DRIP GEL    Place into the nose 2 (two) times daily as needed (nose bleeds).   DOCUSATE SODIUM (COLACE) 100 MG CAPSULE    Take 200 mg by  mouth at bedtime.    DORZOLAMIDE-TIMOLOL (COSOPT) 22.3-6.8 MG/ML OPHTHALMIC SOLUTION    Place 1 drop into the left eye every 12 (twelve) hours.   DULOXETINE (CYMBALTA) 30 MG CAPSULE    Take 30 mg by mouth 2 (two) times daily.   FENTANYL (DURAGESIC - DOSED MCG/HR) 100 MCG/HR    Apply one patch topically every 3 days for pain   FUROSEMIDE (LASIX) 40 MG TABLET    Take 40 mg by mouth 2 (two) times daily.    GLUCAGON (GLUCAGEN) 1 MG SOLR INJECTION    Inject 1 mg into the vein once as needed for low blood sugar.   GLUCOSAMINE-CHONDROITIN 500-400 MG TABLET    Take 1 tablet by mouth 3 (three) times daily.   GUAIFENESIN (ROBITUSSIN) 100 MG/5ML SYRUP    Take 300 mg by mouth 3 (three) times daily as needed for cough.   HYDROXYZINE (ATARAX/VISTARIL) 25 MG TABLET    Take 25 mg by mouth at bedtime as needed. For itching   INSULIN ASPART (NOVOLOG) 100 UNIT/ML INJECTION    Inject 8-12 Units into the skin 3 (three) times daily with meals. 8 units If CBG is greater than 150 at lunch and 12 units if CBG is greater than 150 at breakfast and dinner   INSULIN GLARGINE (LANTUS) 100 UNIT/ML INJECTION    Inject 16 Units into the skin at bedtime.    ISOSORBIDE MONONITRATE (IMDUR) 60 MG 24 HR TABLET    Take 60 mg by mouth every morning.    LEVOTHYROXINE (SYNTHROID, LEVOTHROID) 75  MCG TABLET    Take 75-112.5 mcg by mouth daily. Take 1 and 1/2 tablets on Saturdays then take 1 tablet all the other days   LORATADINE (CLARITIN) 10 MG TABLET    Take 10 mg by mouth daily.    LOSARTAN (COZAAR) 25 MG TABLET    Take 25 mg by mouth daily.   LUBIPROSTONE (AMITIZA) 24 MCG CAPSULE    Take 24 mcg by mouth 2 (two) times daily with a meal.     MEMANTINE HCL ER (NAMENDA XR) 28 MG CP24    Take by mouth.   METHYLCELLULOSE (ARTIFICIAL TEARS) 1 % OPHTHALMIC SOLUTION    Place 1 drop into both eyes 4 (four) times daily as needed (for dryness).   MULTIPLE VITAMINS-MINERALS (CERTAVITE SENIOR/ANTIOXIDANT PO)    Take 1 tablet by mouth daily.   NITROGLYCERIN (NITROSTAT) 0.4 MG SL TABLET    Place 0.4 mg under the tongue every 5 (five) minutes as needed. For chest pain   NUTRITIONAL SUPPLEMENTS (RESOURCE 2.0 PO)    Take 120 mLs by mouth 3 (three) times daily.   OMEPRAZOLE (PRILOSEC) 20 MG CAPSULE    Take 20 mg by mouth 2 (two) times daily.   PROBIOTIC PRODUCT (ALIGN) 4 MG CAPS    Take 1 capsule by mouth daily.   PROPRANOLOL (INDERAL) 20 MG TABLET    Take 20 mg by mouth 2 (two) times daily.   SPIRONOLACTONE (ALDACTONE) 25 MG TABLET    Take 25 mg by mouth daily.    VITAMIN D, ERGOCALCIFEROL, (DRISDOL) 50000 UNITS CAPS    Take 50,000 Units by mouth every 30 (thirty) days. On the first of the month  Modified Medications   No medications on file  Discontinued Medications   No medications on file     Physical Exam: Physical Exam  Constitutional: She appears well-developed.  HENT:  Head: Normocephalic and atraumatic.  Left Ear: External ear normal.  Eyes: Conjunctivae and EOM  are normal. Pupils are equal, round, and reactive to light.  Neck: Neck supple. No JVD present. No thyromegaly present.  Cardiovascular: Normal rate and regular rhythm.   Murmur heard. Pulmonary/Chest: Effort normal. She has rales.  Bibasilar rales.   Abdominal: Soft. There is no tenderness. There is no rebound.    Musculoskeletal: She exhibits edema.  Back pain is chronic which is well controlled. BLE chronic edema 1+  Lymphadenopathy:    She has no cervical adenopathy.  Neurological: She is alert. No cranial nerve deficit. Coordination normal.  Skin: Skin is warm and dry. Rash noted.  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 R+L Intergluteal cleft recurrent pressure ulcers in mirror image about a quarter size.  Left forefoot diabetic ulcer--chronic about a quarter size. Excoriated urogenital/buttocks/perinum.    Psychiatric: Her mood appears anxious. Her affect is not blunt, not labile and not inappropriate. Her speech is rapid and/or pressured (occasionally ). Her speech is not delayed and not slurred. She is hyperactive. She is not agitated, not aggressive, not slowed, not withdrawn, not actively hallucinating and not combative. Thought content is not paranoid and not delusional. She does not exhibit a depressed mood. She exhibits abnormal recent memory.  MMSE 21/30 08/2013    Filed Vitals:   07/01/14 1534  BP: 140/70  Pulse: 80  Temp: 97.8 F (36.6 C)  TempSrc: Tympanic  Resp: 18      Labs reviewed: Basic Metabolic Panel:  Recent Labs  07/20/13 1921  08/26/13  12/23/13 03/06/14 04/20/14  NA 136  < >  --   < > 139 138 136*  K 3.9  < >  --   < > 3.5 4.6 4.9  CL 97  --   --   --   --   --   --   CO2 30  --   --   --   --   --   --   GLUCOSE 147*  --   --   --   --   --   --   BUN 46*  < >  --   < > 30* 33* 30*  CREATININE 1.00  < >  --   < > 1.0 0.9 0.8  CALCIUM 9.3  --   --   --   --   --   --   TSH  --   --  2.07  --   --  4.31  --   < > = values in this interval not displayed. Liver Function Tests:  Recent Labs  07/20/13 1921  11/21/13 03/06/14 04/20/14  AST 19  < > 17 20 19   ALT 12  < > 9 8 12   ALKPHOS 62  < > 67 85 79  BILITOT 0.3  --   --   --   --   PROT 7.0  --   --   --   --   ALBUMIN 2.8*  --   --   --   --   < > = values in this interval not displayed. CBC:  Recent  Labs  07/20/13 1921  12/23/13 03/06/14 04/20/14  WBC 9.3  < > 6.7 5.8 6.4  NEUTROABS 6.5  --   --   --   --   HGB 13.8  < > 13.3 13.4 14.3  HCT 39.7  < > 40 41 42  MCV 97.1  --   --   --   --   PLT 299  < >  268 214 260  < > = values in this interval not displayed.  Past Procedures:  10/22/13 CXR patchy interstitial changes left lower lung most consistent with pneumonia  11/21/13 CXR minimal cardiomegaly slightly increased without pulmonary vascular congestion or pleural effusion, no inflammatory consolidate or suspicious nodule, interval resolution of interstitial pneumonitis at the left lower lung.   01/29/14 for minor head injury and dementia: head CT w/o contrast: no acute findings.   04/18/14 X-ray L knee/ankle/tibia/fibula: no acute fracture or dislocation  Assessment/Plan Falls frequently 06/29/14 Fell-walking to [place] and fell backward. Lack of safety awareness and increase frailty contributed to her falling.    Type II or unspecified type diabetes mellitus with peripheral circulatory disorders, uncontrolled(250.72) No further hypoglycemic episodes since Lantus16 units. Continue bolus Novolog 8 units bid and 12 units with lunch. Hgb A1c 7.8 03/06/14. CBGs am 88-230, noon 227-316, pm 91-261. Update Hgb A1c.      Leg swelling Chronic venous insufficiency and dermatitis. Stable. Chronic. 1+. Continue Furosemide and Spironolactone. Bun/creat 30/0.85 04/20/14. Update BMP   Unspecified hypothyroidism Corrected with Levothyroxine 112.15mcg, last TSH 2.069 08/26/13 and 4.306 03/06/14. Update TSH    Left leg pain still able to bear weight with pain. 04/20/14 Xray L knee/ankle/tibia/fibula: no acute fracture or dislocation. Pain management: Fentanyl for back pain should be cover her right leg/ankle pain. W/c for mobility until LLE pain is better. Otherwise Ortho consult.     A-fib Rate controlled. Propranolol was increased to 20mg  bid for better rate control in setting of HTN and essential  tremor. Saw Dr. Mare Ferrari 02/03/14 doing well, EKG A-fib with controlled VR. 1year office visit EKG     Dementia with behavioral disturbance Her MMSE 21/3010/04/2013. Given her hx of cardiovascular issues--vascular dementia is more likely. Tolerated  Namenda and increased Cymbalta to 30mg  bid. Mood is better controlled. Gradual decline in her cognition noted. Lack of safety awareness contributed to her frequent falling.     Depression Better with Cymbalta 30mg  bid and Namenda. Observe the patient.     Essential tremor Mild head titubation and hands tremor-not disabling. Takes Propranolol 10mg  bid    HTN (hypertension) Controlled, continue Losartan 25mg  daily, Atenolol, monitor blood pressure daily. Dr. Mare Ferrari 02/03/14 continue same cardiac meds.     Unspecified constipation Stable, takes colace 100 II qhs and Linzess(off Amitiza 45mcg bid-therapeutic exchange by Pharm )and MiraLax prn.    Ischemic heart disease angina occasionally, f/u Cardiology, continue with Imdur 60mg  and Prn NTG(last dose 02/12/13) and ASA 81mg .      Dyspepsia Stable, takes Omeprazole 20mg  bid.     Family/ Staff Communication: observe the patient.   Goals of Care: SNF  Labs/tests ordered: BMP, Hgb A1c, TSH

## 2014-07-01 NOTE — Assessment & Plan Note (Signed)
Corrected with Levothyroxine 112.56mcg, last TSH 2.069 08/26/13 and 4.306 03/06/14. Update TSH

## 2014-07-01 NOTE — Assessment & Plan Note (Signed)
Stable, takes colace 100 II qhs and Linzess(off Amitiza 24mcg bid-therapeutic exchange by Pharm )and MiraLax prn.  

## 2014-07-01 NOTE — Assessment & Plan Note (Signed)
Her MMSE 21/3010/04/2013. Given her hx of cardiovascular issues--vascular dementia is more likely. Tolerated  Namenda and increased Cymbalta to 30mg bid. Mood is better controlled. Gradual decline in her cognition noted. Lack of safety awareness contributed to her frequent falling.    

## 2014-07-01 NOTE — Assessment & Plan Note (Signed)
angina occasionally, f/u Cardiology, continue with Imdur 60mg and Prn NTG(last dose 02/12/13) and ASA 81mg.    

## 2014-07-01 NOTE — Assessment & Plan Note (Signed)
No further hypoglycemic episodes since Lantus16 units. Continue bolus Novolog 8 units bid and 12 units with lunch. Hgb A1c 7.8 03/06/14. CBGs am 88-230, noon 227-316, pm 91-261. Update Hgb A1c.

## 2014-07-01 NOTE — Assessment & Plan Note (Signed)
still able to bear weight with pain. 04/20/14 Xray L knee/ankle/tibia/fibula: no acute fracture or dislocation. Pain management: Fentanyl for back pain should be cover her right leg/ankle pain. W/c for mobility until LLE pain is better. Otherwise Ortho consult.

## 2014-07-01 NOTE — Assessment & Plan Note (Signed)
Chronic venous insufficiency and dermatitis. Stable. Chronic. 1+. Continue Furosemide and Spironolactone. Bun/creat 30/0.85 04/20/14. Update BMP

## 2014-07-01 NOTE — Assessment & Plan Note (Signed)
Controlled, continue Losartan 25mg daily, Atenolol, monitor blood pressure daily. Dr. Brackbill 02/03/14 continue same cardiac meds.    

## 2014-07-01 NOTE — Assessment & Plan Note (Signed)
06/29/14 Fell-walking to BR and fell backward. Lack of safety awareness and increase frailty contributed to her falling.

## 2014-07-01 NOTE — Assessment & Plan Note (Signed)
Stable, takes Omeprazole 20mg bid.  

## 2014-07-01 NOTE — Assessment & Plan Note (Signed)
Better with Cymbalta 30mg  bid and Namenda. Observe the patient.

## 2014-07-01 NOTE — Assessment & Plan Note (Signed)
Rate controlled. Propranolol was increased to 20mg bid for better rate control in setting of HTN and essential tremor. Saw Dr. Brackbill 02/03/14 doing well, EKG A-fib with controlled VR. 1year office visit EKG  

## 2014-07-01 NOTE — Assessment & Plan Note (Signed)
Mild head titubation and hands tremor-not disabling. Takes Propranolol 10mg bid  

## 2014-07-03 LAB — BASIC METABOLIC PANEL
BUN: 26 mg/dL — AB (ref 4–21)
CREATININE: 0.8 mg/dL (ref 0.5–1.1)
GLUCOSE: 106 mg/dL
Potassium: 4.2 mmol/L (ref 3.4–5.3)
Sodium: 137 mmol/L (ref 137–147)

## 2014-07-03 LAB — HEMOGLOBIN A1C: Hgb A1c MFr Bld: 8.1 % — AB (ref 4.0–6.0)

## 2014-07-03 LAB — TSH: TSH: 3.77 u[IU]/mL (ref 0.41–5.90)

## 2014-07-04 ENCOUNTER — Other Ambulatory Visit: Payer: Self-pay | Admitting: Nurse Practitioner

## 2014-07-04 DIAGNOSIS — E039 Hypothyroidism, unspecified: Secondary | ICD-10-CM

## 2014-07-04 DIAGNOSIS — E1159 Type 2 diabetes mellitus with other circulatory complications: Secondary | ICD-10-CM

## 2014-07-10 IMAGING — CT CT HEAD W/O CM
1 series · 16 of 30 positions shown, 20 images · non-contrast
Comparison: Head CT scan 11/14/2012.

CLINICAL DATA: Recurrent falls.

EXAM:
CT HEAD WITHOUT CONTRAST
TECHNIQUE: Contiguous axial images were obtained from the base of the skull
through the vertex without intravenous contrast.

[Series 2: head 5.0 h31s · axial · 0.46mm/px · z∈[-160,-10]mm · 16 of 34 slices shown, 20 images]
[im 2/34  brain]
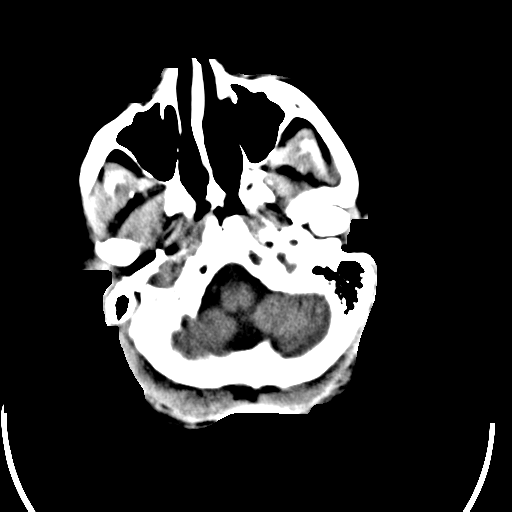
[im 2/34  bone]
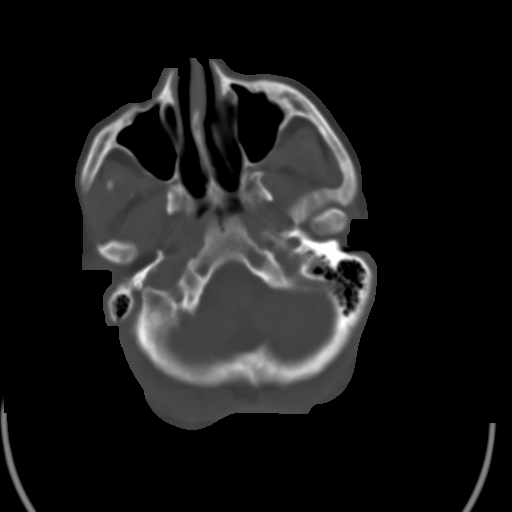
[im 4/34  brain]
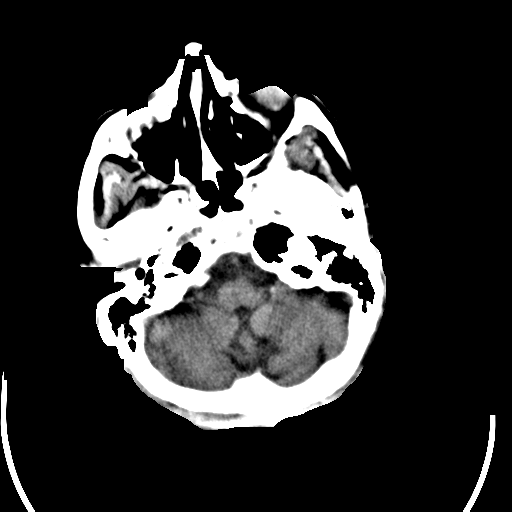
[im 6/34  brain]
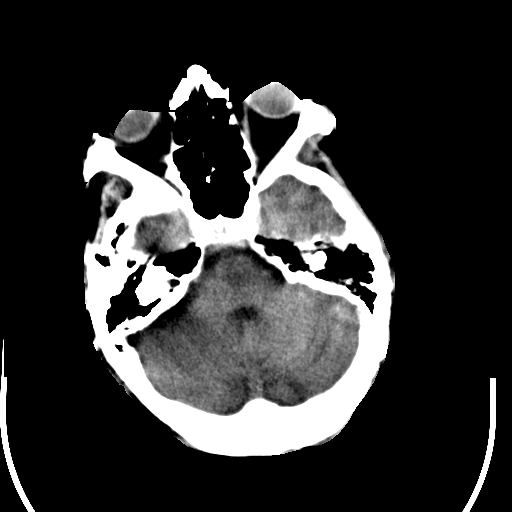
[im 8/34  brain]
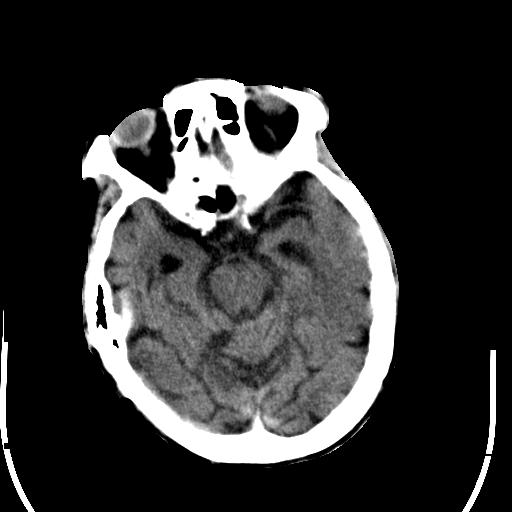
[im 10/34  brain]
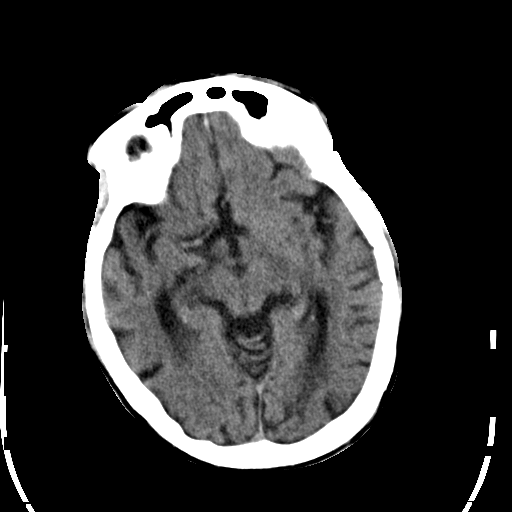
[im 10/34  bone]
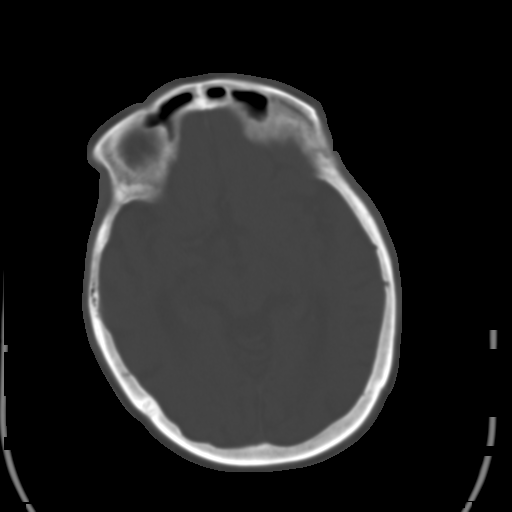
[im 12/34  brain]
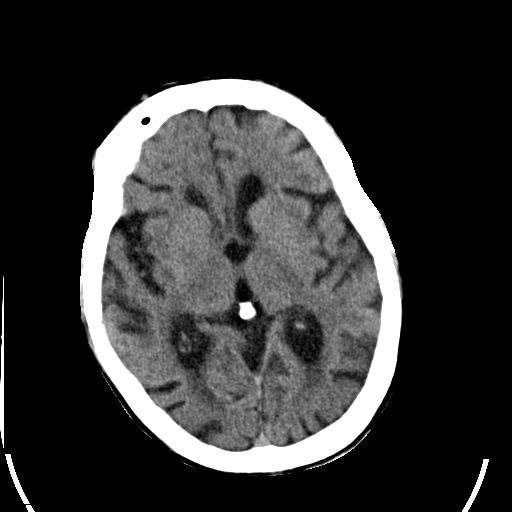
[im 14/34  brain]
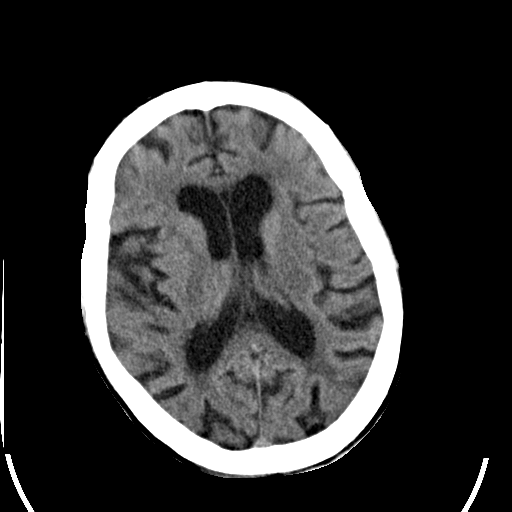
[im 16/34  brain]
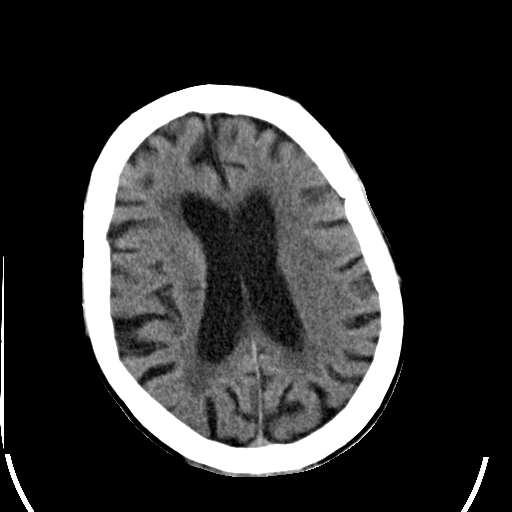
[im 18/34  brain]
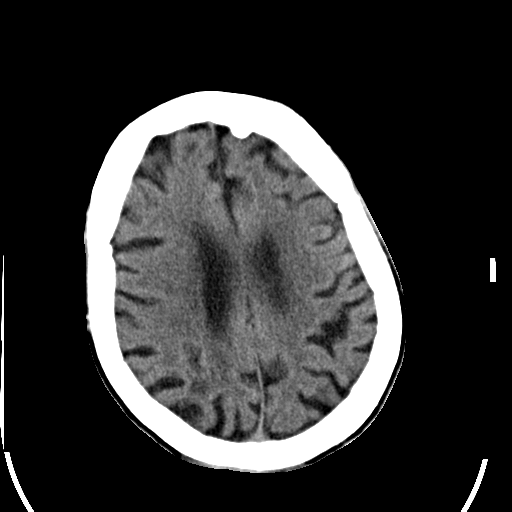
[im 18/34  bone]
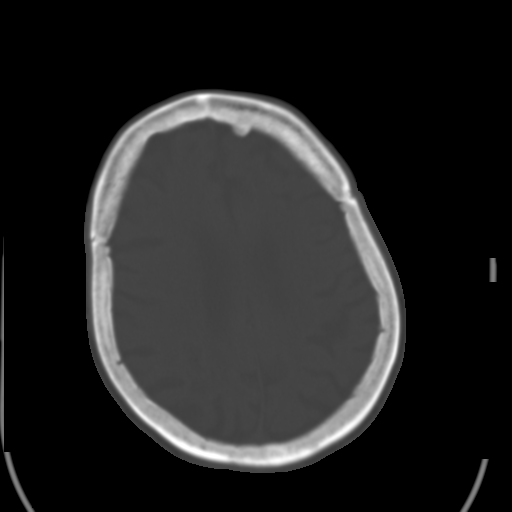
[im 20/34  brain]
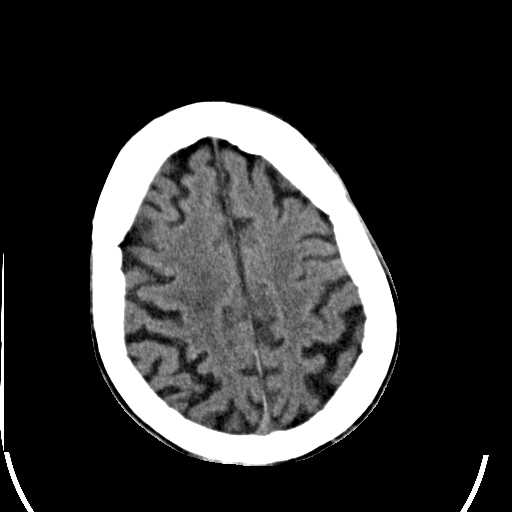
[im 22/34  brain]
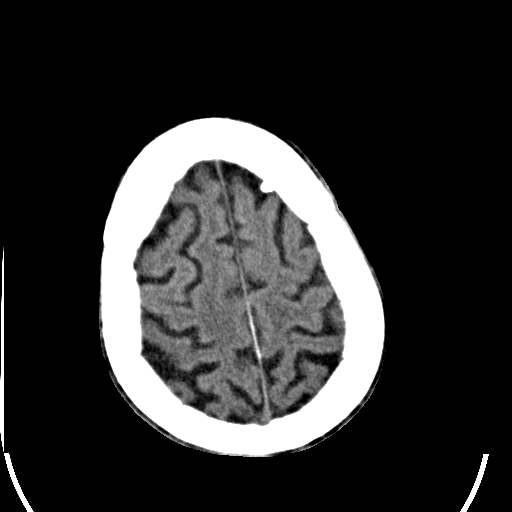
[im 24/34  brain]
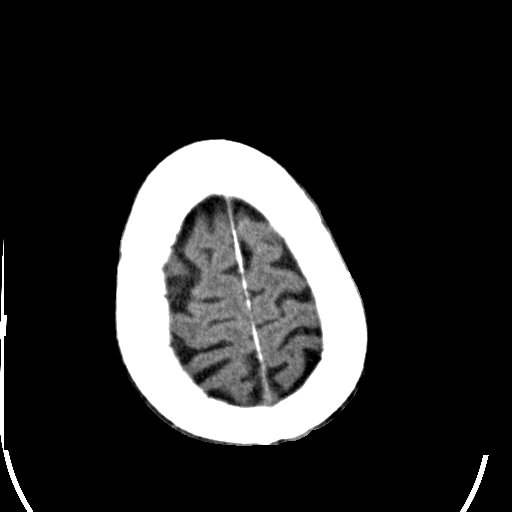
[im 26/34  brain]
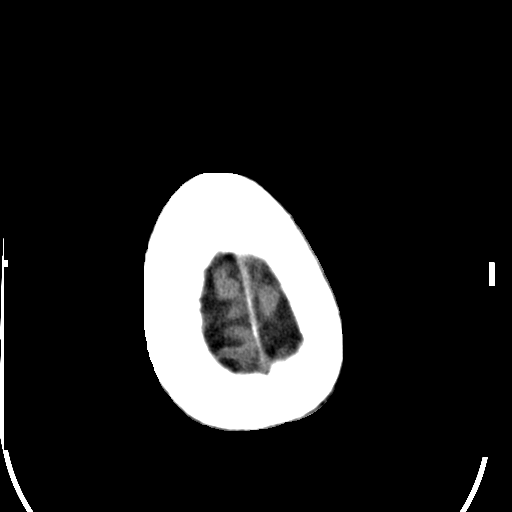
[im 26/34  bone]
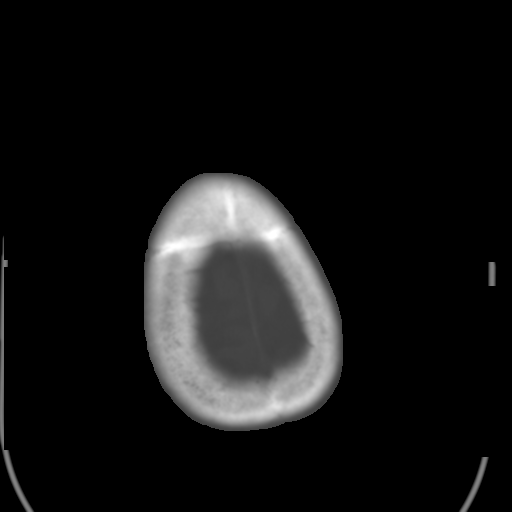
[im 28/34  brain]
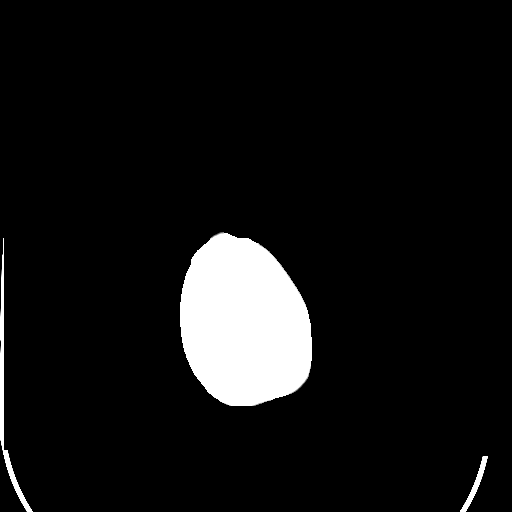
[im 30/34  brain]
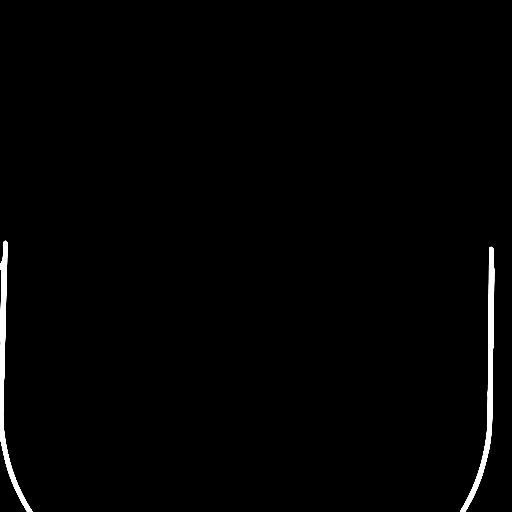
[im 32/34  brain]
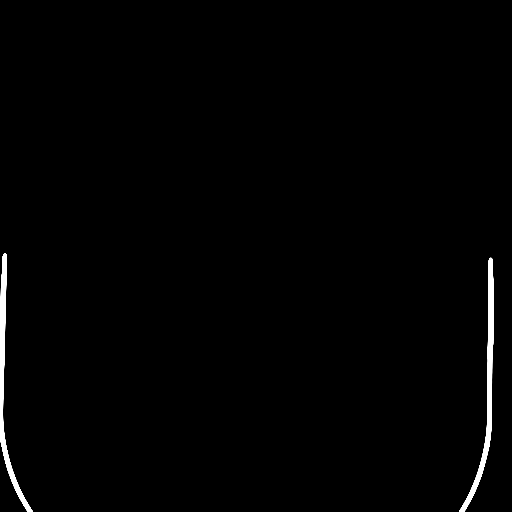

[16 of 30 positions shown; findings below may reference images not displayed]

FINDINGS: There is cortical atrophy and chronic microvascular ischemic change.
No evidence of acute intracranial abnormality including infarct,
hemorrhage, mass lesion, mass effect, midline shift or abnormal
extra-axial fluid collection. No hydrocephalus or pneumocephalus.
IMPRESSION: No acute finding.

Atrophy and chronic microvascular ischemic change.

## 2014-07-18 ENCOUNTER — Non-Acute Institutional Stay (SKILLED_NURSING_FACILITY): Payer: Medicare Other | Admitting: Nurse Practitioner

## 2014-07-18 ENCOUNTER — Encounter: Payer: Self-pay | Admitting: Nurse Practitioner

## 2014-07-18 DIAGNOSIS — F3289 Other specified depressive episodes: Secondary | ICD-10-CM

## 2014-07-18 DIAGNOSIS — E1159 Type 2 diabetes mellitus with other circulatory complications: Secondary | ICD-10-CM

## 2014-07-18 DIAGNOSIS — F03918 Unspecified dementia, unspecified severity, with other behavioral disturbance: Secondary | ICD-10-CM

## 2014-07-18 DIAGNOSIS — M79672 Pain in left foot: Secondary | ICD-10-CM

## 2014-07-18 DIAGNOSIS — G252 Other specified forms of tremor: Secondary | ICD-10-CM

## 2014-07-18 DIAGNOSIS — I259 Chronic ischemic heart disease, unspecified: Secondary | ICD-10-CM

## 2014-07-18 DIAGNOSIS — M7989 Other specified soft tissue disorders: Secondary | ICD-10-CM

## 2014-07-18 DIAGNOSIS — F0391 Unspecified dementia with behavioral disturbance: Secondary | ICD-10-CM

## 2014-07-18 DIAGNOSIS — K5900 Constipation, unspecified: Secondary | ICD-10-CM

## 2014-07-18 DIAGNOSIS — F32A Depression, unspecified: Secondary | ICD-10-CM

## 2014-07-18 DIAGNOSIS — I10 Essential (primary) hypertension: Secondary | ICD-10-CM

## 2014-07-18 DIAGNOSIS — M722 Plantar fascial fibromatosis: Secondary | ICD-10-CM | POA: Insufficient documentation

## 2014-07-18 DIAGNOSIS — F329 Major depressive disorder, single episode, unspecified: Secondary | ICD-10-CM

## 2014-07-18 DIAGNOSIS — E039 Hypothyroidism, unspecified: Secondary | ICD-10-CM

## 2014-07-18 DIAGNOSIS — M79609 Pain in unspecified limb: Secondary | ICD-10-CM

## 2014-07-18 DIAGNOSIS — G250 Essential tremor: Secondary | ICD-10-CM

## 2014-07-18 NOTE — Progress Notes (Signed)
Patient ID: ILEA HILTON, female   DOB: 1926/07/20, 80 y.o.   MRN: [id number]   Code Status: DNR  Allergies  Allergen Reactions  . Penicillins Other (See Comments)    Per MAR  . Bee Venom Other (See Comments)    Per MAR  . Celebrex [Celecoxib] Other (See Comments)    Per MAR  . Lidocaine Hcl Other (See Comments)    Per MAR  . Phenobarbital Other (See Comments)    Per MAR  . Procaine Hcl Other (See Comments)    Per mar    Chief Complaint  Patient presents with  . Medical Management of Chronic Issues  . Acute Visit    pain left plantar aspect of foot.     HPI: Patient is a 80 y.o. female seen in the SNF at [facility] today for evaluation of s/p fall 06/29/14, blood sugar, and chronic medical conditions.     ED 01/29/14 for minor head injury and dementia: head CT w/o contrast: no acute findings.  Problem List Items Addressed This Visit   Type II or unspecified type diabetes mellitus with peripheral circulatory disorders, uncontrolled(250.72) (Chronic)     No further hypoglycemic episodes since Lantus16 units. Continue bolus Novolog 8 units bid and 12 units with lunch. Hgb A1c 7.8 03/06/14. Hgb A1c 8.1 07/03/14. Continue adjust insulin per CBGs    Unspecified hypothyroidism (Chronic)     Corrected with Levothyroxine 112.29mcg, last TSH 2.069 08/26/13 and 4.306 03/06/14 and 3.773 07/03/14      Ischemic heart disease     angina occasionally, f/u Cardiology, continue with Imdur 60mg  and Prn NTG(last dose 02/12/13) and ASA 81mg .        Leg swelling     Chronic venous insufficiency. Takes Furosemide 40mg  bid and Spironolctone25mg  daily.      Depression     Better with Cymbalta 30mg  bid and Namenda. Observe the patient.      Essential tremor     Mild head titubation and hands tremor-not disabling. Takes Propranolol 10mg  bid     HTN (hypertension)     Controlled, continue Losartan 25mg  daily, Atenolol, monitor blood pressure daily. Dr. Mare Ferrari 02/03/14 continue same cardiac  meds.      Unspecified constipation     Stable, takes colace 100 II qhs and Linzess(off Amitiza 46mcg bid-therapeutic exchange by Pharm )and MiraLax prn.       Dementia with behavioral disturbance     Her MMSE 21/3010/04/2013. Given her hx of cardiovascular issues--vascular dementia is more likely. Tolerated  Namenda and increased Cymbalta to 30mg  bid. Mood is better controlled. Gradual decline in her cognition noted. Lack of safety awareness contributed to her frequent falling.     Left foot pain - Primary     Left mid foot diabetic foot ulcer healed. C/o pain mid of the left plantar aspect of the foot-worse with ambulation. Will X-ray 3 views L foot.        Review of Systems:  Review of Systems  Constitutional: Negative for fever, chills, weight loss, malaise/fatigue and diaphoresis.  HENT: Positive for hearing loss. Negative for congestion, ear discharge and sore throat.   Eyes: Negative for pain, discharge and redness.  Respiratory: Negative for cough, sputum production, shortness of breath and wheezing.   Cardiovascular: Positive for leg swelling and PND. Negative for chest pain, palpitations, orthopnea and claudication.       1+  Gastrointestinal: Negative for heartburn, nausea, vomiting, abdominal pain, diarrhea, constipation and blood in stool.  Fecal incontinence  Genitourinary: Positive for frequency. Negative for dysuria, urgency and flank pain.       Leakage.   Musculoskeletal: Positive for back pain, falls and joint pain. Negative for myalgias and neck pain.       Able to ambulate with walker. Falls 03/05/14, 05/22/14, 06/29/14. C/o left foot pain in the mid plantar aspect.   Skin: Positive for rash (chronic BLE venous dermatitis. Left medial forefoot diabetic foot ulcer). Negative for itching.       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 R+L Intergluteal cleft recurrent pressure ulcers in mirror image about a quarter size.  Left forefoot diabetic ulcer--healed. Excoriated  urogenital/buttocks/perinum.   Neurological: Positive for tremors. Negative for dizziness, tingling, sensory change, speech change, focal weakness, seizures, loss of consciousness, weakness and headaches.  Endo/Heme/Allergies: Negative for environmental allergies and polydipsia. Does not bruise/bleed easily.  Psychiatric/Behavioral: Positive for depression and memory loss. Negative for hallucinations. The patient is nervous/anxious and has insomnia.      Past Medical History  Diagnosis Date  . Diabetes mellitus   . Hypertension   . Heart murmur   . Benign familial tremor   . Rosacea   . Diverticulitis   . Heart failure     diastolic heart failure  . TIA (transient ischemic attack) 2010  . Arthritis   . Breast cancer     breast -rt  . CAD (coronary artery disease)     single vessel CAD per cath in 2006; scattered nonobstructive disease in the left system, left to right collaterals to the RCA which was occluded by flush shots; she has been managed medically   Past Surgical History  Procedure Laterality Date  . Mastectomy partial / lumpectomy  11/02/2000    Right, with SLN  . Mastectomy partial / lumpectomy  05/15/2008    Left  . Breast lumpectomy  01/19/2010    right  . Mastectomy  03/31/2010    Right   Social History:   reports that she has never smoked. She does not have any smokeless tobacco history on file. She reports that she does not drink alcohol or use illicit drugs.  Medications: Patient's Medications  New Prescriptions   No medications on file  Previous Medications   ACETAMINOPHEN (TYLENOL) 325 MG TABLET    Take 2 tablets (650 mg total) by mouth every 4 (four) hours as needed.   ALUM & MAG HYDROXIDE-SIMETH (MAALOX/MYLANTA) 200-200-20 MG/5ML SUSPENSION    Take 30 mLs by mouth every 6 (six) hours as needed for indigestion or heartburn.   ASPIRIN EC 81 MG TABLET    Take 81 mg by mouth daily.   AYR SALINE NASAL NO-DRIP GEL    Place into the nose 2 (two) times daily as  needed (nose bleeds).   DOCUSATE SODIUM (COLACE) 100 MG CAPSULE    Take 200 mg by mouth at bedtime.    DORZOLAMIDE-TIMOLOL (COSOPT) 22.3-6.8 MG/ML OPHTHALMIC SOLUTION    Place 1 drop into the left eye every 12 (twelve) hours.   DULOXETINE (CYMBALTA) 30 MG CAPSULE    Take 30 mg by mouth 2 (two) times daily.   FENTANYL (DURAGESIC - DOSED MCG/HR) 100 MCG/HR    Apply one patch topically every 3 days for pain   FUROSEMIDE (LASIX) 40 MG TABLET    Take 40 mg by mouth 2 (two) times daily.    GLUCAGON (GLUCAGEN) 1 MG SOLR INJECTION    Inject 1 mg into the vein once as needed for low blood sugar.  GLUCOSAMINE-CHONDROITIN 500-400 MG TABLET    Take 1 tablet by mouth 3 (three) times daily.   GUAIFENESIN (ROBITUSSIN) 100 MG/5ML SYRUP    Take 300 mg by mouth 3 (three) times daily as needed for cough.   HYDROXYZINE (ATARAX/VISTARIL) 25 MG TABLET    Take 25 mg by mouth at bedtime as needed. For itching   INSULIN ASPART (NOVOLOG) 100 UNIT/ML INJECTION    Inject 8-12 Units into the skin 3 (three) times daily with meals. 8 units If CBG is greater than 150 at lunch and 12 units if CBG is greater than 150 at breakfast and dinner   INSULIN GLARGINE (LANTUS) 100 UNIT/ML INJECTION    Inject 16 Units into the skin at bedtime.    ISOSORBIDE MONONITRATE (IMDUR) 60 MG 24 HR TABLET    Take 60 mg by mouth every morning.    LEVOTHYROXINE (SYNTHROID, LEVOTHROID) 75 MCG TABLET    Take 75-112.5 mcg by mouth daily. Take 1 and 1/2 tablets on Saturdays then take 1 tablet all the other days   LORATADINE (CLARITIN) 10 MG TABLET    Take 10 mg by mouth daily.    LOSARTAN (COZAAR) 25 MG TABLET    Take 25 mg by mouth daily.   LUBIPROSTONE (AMITIZA) 24 MCG CAPSULE    Take 24 mcg by mouth 2 (two) times daily with a meal.     MEMANTINE HCL ER (NAMENDA XR) 28 MG CP24    Take by mouth.   METHYLCELLULOSE (ARTIFICIAL TEARS) 1 % OPHTHALMIC SOLUTION    Place 1 drop into both eyes 4 (four) times daily as needed (for dryness).   MULTIPLE  VITAMINS-MINERALS (CERTAVITE SENIOR/ANTIOXIDANT PO)    Take 1 tablet by mouth daily.   NITROGLYCERIN (NITROSTAT) 0.4 MG SL TABLET    Place 0.4 mg under the tongue every 5 (five) minutes as needed. For chest pain   NUTRITIONAL SUPPLEMENTS (RESOURCE 2.0 PO)    Take 120 mLs by mouth 3 (three) times daily.   OMEPRAZOLE (PRILOSEC) 20 MG CAPSULE    Take 20 mg by mouth 2 (two) times daily.   PROBIOTIC PRODUCT (ALIGN) 4 MG CAPS    Take 1 capsule by mouth daily.   PROPRANOLOL (INDERAL) 20 MG TABLET    Take 20 mg by mouth 2 (two) times daily.   SPIRONOLACTONE (ALDACTONE) 25 MG TABLET    Take 25 mg by mouth daily.    VITAMIN D, ERGOCALCIFEROL, (DRISDOL) 50000 UNITS CAPS    Take 50,000 Units by mouth every 30 (thirty) days. On the first of the month  Modified Medications   No medications on file  Discontinued Medications   No medications on file     Physical Exam: Physical Exam  Constitutional: She appears well-developed.  HENT:  Head: Normocephalic and atraumatic.  Left Ear: External ear normal.  Eyes: Conjunctivae and EOM are normal. Pupils are equal, round, and reactive to light.  Neck: Neck supple. No JVD present. No thyromegaly present.  Cardiovascular: Normal rate and regular rhythm.   Murmur heard. Pulmonary/Chest: Effort normal. She has rales.  Bibasilar rales.   Abdominal: Soft. There is no tenderness. There is no rebound.  Musculoskeletal: She exhibits edema.  Back pain is chronic which is well controlled. BLE chronic edema 1+. Left mid plantar aspect foot pain-worsens with weight bearing.   Lymphadenopathy:    She has no cervical adenopathy.  Neurological: She is alert. No cranial nerve deficit. Coordination normal.  Skin: Skin is warm and dry. Rash noted.  Chronic  venous dermatitis in BLE. R+L Intergluteal cleft recurrent pressure ulcers in mirror image about a quarter size.  Left forefoot diabetic ulcer--healed. Excoriated urogenital/buttocks/perinum.    Psychiatric: Her mood  appears anxious. Her affect is not blunt, not labile and not inappropriate. Her speech is rapid and/or pressured (occasionally ). Her speech is not delayed and not slurred. She is hyperactive. She is not agitated, not aggressive, not slowed, not withdrawn, not actively hallucinating and not combative. Thought content is not paranoid and not delusional. She does not exhibit a depressed mood. She exhibits abnormal recent memory.  MMSE 21/30 08/2013    Filed Vitals:   07/18/14 1421  BP: 140/70  Pulse: 80  Temp: 97.8 F (36.6 C)  TempSrc: Tympanic  Resp: 18      Labs reviewed: Basic Metabolic Panel:  Recent Labs  08/26/13  03/06/14 04/20/14 07/03/14  NA  --   < > 138 136* 137  K  --   < > 4.6 4.9 4.2  BUN  --   < > 33* 30* 26*  CREATININE  --   < > 0.9 0.8 0.8  TSH 2.07  --  4.31  --  3.77  < > = values in this interval not displayed. Liver Function Tests:  Recent Labs  11/21/13 03/06/14 04/20/14  AST 17 20 19   ALT 9 8 12   ALKPHOS 67 85 79   CBC:  Recent Labs  12/23/13 03/06/14 04/20/14  WBC 6.7 5.8 6.4  HGB 13.3 13.4 14.3  HCT 40 41 42  PLT 268 214 260    Past Procedures:  10/22/13 CXR patchy interstitial changes left lower lung most consistent with pneumonia  11/21/13 CXR minimal cardiomegaly slightly increased without pulmonary vascular congestion or pleural effusion, no inflammatory consolidate or suspicious nodule, interval resolution of interstitial pneumonitis at the left lower lung.   01/29/14 for minor head injury and dementia: head CT w/o contrast: no acute findings.   04/18/14 X-ray L knee/ankle/tibia/fibula: no acute fracture or dislocation  Assessment/Plan Left foot pain Left mid foot diabetic foot ulcer healed. C/o pain mid of the left plantar aspect of the foot-worse with ambulation. Will X-ray 3 views L foot.   Type II or unspecified type diabetes mellitus with peripheral circulatory disorders, uncontrolled(250.72) No further hypoglycemic episodes  since Lantus16 units. Continue bolus Novolog 8 units bid and 12 units with lunch. Hgb A1c 7.8 03/06/14. Hgb A1c 8.1 07/03/14. Continue adjust insulin per CBGs  Unspecified hypothyroidism Corrected with Levothyroxine 112.10mcg, last TSH 2.069 08/26/13 and 4.306 03/06/14 and 3.773 07/03/14    Back pain Chronic-coccyalgia in nature, pain is controlled with Fentanyl 136mcg/hr      HTN (hypertension) Controlled, continue Losartan 25mg  daily, Atenolol, monitor blood pressure daily. Dr. Mare Ferrari 02/03/14 continue same cardiac meds.    Ischemic heart disease angina occasionally, f/u Cardiology, continue with Imdur 60mg  and Prn NTG(last dose 02/12/13) and ASA 81mg .      Depression Better with Cymbalta 30mg  bid and Namenda. Observe the patient.    Dementia with behavioral disturbance Her MMSE 21/3010/04/2013. Given her hx of cardiovascular issues--vascular dementia is more likely. Tolerated  Namenda and increased Cymbalta to 30mg  bid. Mood is better controlled. Gradual decline in her cognition noted. Lack of safety awareness contributed to her frequent falling.   Unspecified constipation Stable, takes colace 100 II qhs and Linzess(off Amitiza 26mcg bid-therapeutic exchange by Pharm )and MiraLax prn.     Leg swelling Chronic venous insufficiency. Takes Furosemide 40mg  bid and Spironolctone25mg  daily.  Essential tremor Mild head titubation and hands tremor-not disabling. Takes Propranolol 10mg  bid     Family/ Staff Communication: observe the patient.   Goals of Care: SNF  Labs/tests ordered: X-ray left foot 3 views.

## 2014-07-18 NOTE — Assessment & Plan Note (Signed)
Left mid foot diabetic foot ulcer healed. C/o pain mid of the left plantar aspect of the foot-worse with ambulation. Will X-ray 3 views L foot.

## 2014-07-22 ENCOUNTER — Encounter: Payer: Self-pay | Admitting: Nurse Practitioner

## 2014-07-22 ENCOUNTER — Non-Acute Institutional Stay (SKILLED_NURSING_FACILITY): Payer: Medicare Other | Admitting: Nurse Practitioner

## 2014-07-22 DIAGNOSIS — F0391 Unspecified dementia with behavioral disturbance: Secondary | ICD-10-CM

## 2014-07-22 DIAGNOSIS — R1013 Epigastric pain: Secondary | ICD-10-CM

## 2014-07-22 DIAGNOSIS — K3189 Other diseases of stomach and duodenum: Secondary | ICD-10-CM

## 2014-07-22 DIAGNOSIS — F03918 Unspecified dementia, unspecified severity, with other behavioral disturbance: Secondary | ICD-10-CM

## 2014-07-22 DIAGNOSIS — F32A Depression, unspecified: Secondary | ICD-10-CM

## 2014-07-22 DIAGNOSIS — M545 Low back pain, unspecified: Secondary | ICD-10-CM

## 2014-07-22 DIAGNOSIS — R609 Edema, unspecified: Secondary | ICD-10-CM

## 2014-07-22 DIAGNOSIS — G250 Essential tremor: Secondary | ICD-10-CM

## 2014-07-22 DIAGNOSIS — E1159 Type 2 diabetes mellitus with other circulatory complications: Secondary | ICD-10-CM

## 2014-07-22 DIAGNOSIS — I10 Essential (primary) hypertension: Secondary | ICD-10-CM

## 2014-07-22 DIAGNOSIS — G252 Other specified forms of tremor: Secondary | ICD-10-CM

## 2014-07-22 DIAGNOSIS — M722 Plantar fascial fibromatosis: Secondary | ICD-10-CM

## 2014-07-22 DIAGNOSIS — F329 Major depressive disorder, single episode, unspecified: Secondary | ICD-10-CM

## 2014-07-22 DIAGNOSIS — F3289 Other specified depressive episodes: Secondary | ICD-10-CM

## 2014-07-22 DIAGNOSIS — I259 Chronic ischemic heart disease, unspecified: Secondary | ICD-10-CM

## 2014-07-22 DIAGNOSIS — E039 Hypothyroidism, unspecified: Secondary | ICD-10-CM

## 2014-07-22 NOTE — Assessment & Plan Note (Signed)
Controlled, continue Losartan 25mg daily, Atenolol, monitor blood pressure daily. Dr. Brackbill 02/03/14 continue same cardiac meds.    

## 2014-07-22 NOTE — Assessment & Plan Note (Signed)
angina occasionally, f/u Cardiology, continue with Imdur 60mg and Prn NTG(last dose 02/12/13) and ASA 81mg.    

## 2014-07-22 NOTE — Assessment & Plan Note (Signed)
Stable, takes Omeprazole 20mg bid.  

## 2014-07-22 NOTE — Assessment & Plan Note (Signed)
Better with Cymbalta 30mg  bid and Namenda. Observe the patient.

## 2014-07-22 NOTE — Assessment & Plan Note (Signed)
Stable, takes colace 100 II qhs and Linzess(off Amitiza 24mcg bid-therapeutic exchange by Pharm )and MiraLax prn.  

## 2014-07-22 NOTE — Assessment & Plan Note (Addendum)
07/18/14 X-ray L foot: the findings could be consistent with inflammatory process or osteomyelitis. Neuropathic arthropathy not excluded. MyoFlex qid-observe.

## 2014-07-22 NOTE — Assessment & Plan Note (Signed)
Her MMSE 21/3010/04/2013. Given her hx of cardiovascular issues--vascular dementia is more likely. Tolerated  Namenda and increased Cymbalta to 30mg bid. Mood is better controlled. Gradual decline in her cognition noted. Lack of safety awareness contributed to her frequent falling.    

## 2014-07-22 NOTE — Progress Notes (Signed)
Patient ID: [name], female   DOB: 1926-08-12, 80 y.o.   MRN: [id number]   Code Status: DNR  Allergies  Allergen Reactions  . Penicillins Other (See Comments)    Per MAR  . Bee Venom Other (See Comments)    Per MAR  . Celebrex [Celecoxib] Other (See Comments)    Per MAR  . Lidocaine Hcl Other (See Comments)    Per MAR  . Phenobarbital Other (See Comments)    Per MAR  . Procaine Hcl Other (See Comments)    Per mar    Chief Complaint  Patient presents with  . Medical Management of Chronic Issues  . Acute Visit    left foot inflammatory process.     HPI: Patient is a 80 y.o. female seen in the SNF at [facility] today for evaluation of left foot pain and chronic medical conditions.     ED 01/29/14 for minor head injury and dementia: head CT w/o contrast: no acute findings.  Problem List Items Addressed This Visit   Type II or unspecified type diabetes mellitus with peripheral circulatory disorders, uncontrolled(250.72) (Chronic)     No further hypoglycemic episodes since Lantus16 units. Continue bolus Novolog 8 units bid and 12 units with lunch. Hgb A1c 7.8 03/06/14. Hgb A1c 8.1 07/03/14. Continue adjust insulin per CBGs     Unspecified hypothyroidism (Chronic)     Corrected with Levothyroxine 112.83mcg, last TSH 2.069 08/26/13 and 4.306 03/06/14 and 3.773 07/03/14       Ischemic heart disease     angina occasionally, f/u Cardiology, continue with Imdur 60mg  and Prn NTG(last dose 02/12/13) and ASA 81mg .      Dyspepsia     Stable, takes Omeprazole 20mg  bid.      Depression     Better with Cymbalta 30mg  bid and Namenda. Observe the patient.       Essential tremor     Mild head titubation and hands tremor-not disabling. Takes Propranolol 10mg  bid      Back pain     Chronic-coccyalgia in nature, pain is controlled with Fentanyl 153mcg/hr       HTN (hypertension)     Controlled, continue Losartan 25mg  daily, Atenolol, monitor blood pressure daily. Dr. Mare Ferrari  02/03/14 continue same cardiac meds.       Edema     Chronic venous insufficiency. Takes Furosemide 40mg  bid and Spironolctone25mg  daily    Dementia with behavioral disturbance     Her MMSE 21/3010/04/2013. Given her hx of cardiovascular issues--vascular dementia is more likely. Tolerated  Namenda and increased Cymbalta to 30mg  bid. Mood is better controlled. Gradual decline in her cognition noted. Lack of safety awareness contributed to her frequent falling.      Plantar fasciitis of left foot - Primary     07/18/14 X-ray L foot: the findings could be consistent with inflammatory process or osteomyelitis. Neuropathic arthropathy not excluded. MyoFlex qid-observe.        Review of Systems:  Review of Systems  Constitutional: Negative for fever, chills, weight loss, malaise/fatigue and diaphoresis.  HENT: Positive for hearing loss. Negative for congestion, ear discharge and sore throat.   Eyes: Negative for pain, discharge and redness.  Respiratory: Negative for cough, sputum production, shortness of breath and wheezing.   Cardiovascular: Positive for leg swelling and PND. Negative for chest pain, palpitations, orthopnea and claudication.       1+  Gastrointestinal: Negative for heartburn, nausea, vomiting, abdominal pain, diarrhea, constipation and blood in stool.  Fecal incontinence  Genitourinary: Positive for frequency. Negative for dysuria, urgency and flank pain.       Leakage.   Musculoskeletal: Positive for back pain, falls and joint pain. Negative for myalgias and neck pain.       Able to ambulate with walker. Falls 03/05/14, 05/22/14, 06/29/14. C/o left foot pain in the mid plantar aspect.   Skin: Positive for rash (chronic BLE venous dermatitis. Left medial forefoot diabetic foot ulcer). Negative for itching.       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 R+L Intergluteal cleft recurrent pressure ulcers in mirror image about a quarter size.  Left forefoot diabetic ulcer--healed.  Excoriated urogenital/buttocks/perinum.   Neurological: Positive for tremors. Negative for dizziness, tingling, sensory change, speech change, focal weakness, seizures, loss of consciousness, weakness and headaches.  Endo/Heme/Allergies: Negative for environmental allergies and polydipsia. Does not bruise/bleed easily.  Psychiatric/Behavioral: Positive for depression and memory loss. Negative for hallucinations. The patient is nervous/anxious and has insomnia.      Past Medical History  Diagnosis Date  . Diabetes mellitus   . Hypertension   . Heart murmur   . Benign familial tremor   . Rosacea   . Diverticulitis   . Heart failure     diastolic heart failure  . TIA (transient ischemic attack) 2010  . Arthritis   . Breast cancer     breast -rt  . CAD (coronary artery disease)     single vessel CAD per cath in 2006; scattered nonobstructive disease in the left system, left to right collaterals to the RCA which was occluded by flush shots; she has been managed medically   Past Surgical History  Procedure Laterality Date  . Mastectomy partial / lumpectomy  11/02/2000    Right, with SLN  . Mastectomy partial / lumpectomy  05/15/2008    Left  . Breast lumpectomy  01/19/2010    right  . Mastectomy  03/31/2010    Right   Social History:   reports that she has never smoked. She does not have any smokeless tobacco history on file. She reports that she does not drink alcohol or use illicit drugs.  Medications: Patient's Medications  New Prescriptions   No medications on file  Previous Medications   ACETAMINOPHEN (TYLENOL) 325 MG TABLET    Take 2 tablets (650 mg total) by mouth every 4 (four) hours as needed.   ALUM & MAG HYDROXIDE-SIMETH (MAALOX/MYLANTA) 200-200-20 MG/5ML SUSPENSION    Take 30 mLs by mouth every 6 (six) hours as needed for indigestion or heartburn.   ASPIRIN EC 81 MG TABLET    Take 81 mg by mouth daily.   AYR SALINE NASAL NO-DRIP GEL    Place into the nose 2 (two) times  daily as needed (nose bleeds).   DOCUSATE SODIUM (COLACE) 100 MG CAPSULE    Take 200 mg by mouth at bedtime.    DORZOLAMIDE-TIMOLOL (COSOPT) 22.3-6.8 MG/ML OPHTHALMIC SOLUTION    Place 1 drop into the left eye every 12 (twelve) hours.   DULOXETINE (CYMBALTA) 30 MG CAPSULE    Take 30 mg by mouth 2 (two) times daily.   FENTANYL (DURAGESIC - DOSED MCG/HR) 100 MCG/HR    Apply one patch topically every 3 days for pain   FUROSEMIDE (LASIX) 40 MG TABLET    Take 40 mg by mouth 2 (two) times daily.    GLUCAGON (GLUCAGEN) 1 MG SOLR INJECTION    Inject 1 mg into the vein once as needed for low blood sugar.  GLUCOSAMINE-CHONDROITIN 500-400 MG TABLET    Take 1 tablet by mouth 3 (three) times daily.   GUAIFENESIN (ROBITUSSIN) 100 MG/5ML SYRUP    Take 300 mg by mouth 3 (three) times daily as needed for cough.   HYDROXYZINE (ATARAX/VISTARIL) 25 MG TABLET    Take 25 mg by mouth at bedtime as needed. For itching   INSULIN ASPART (NOVOLOG) 100 UNIT/ML INJECTION    Inject 8-12 Units into the skin 3 (three) times daily with meals. 8 units If CBG is greater than 150 at lunch and 12 units if CBG is greater than 150 at breakfast and dinner   INSULIN GLARGINE (LANTUS) 100 UNIT/ML INJECTION    Inject 16 Units into the skin at bedtime.    ISOSORBIDE MONONITRATE (IMDUR) 60 MG 24 HR TABLET    Take 60 mg by mouth every morning.    LEVOTHYROXINE (SYNTHROID, LEVOTHROID) 75 MCG TABLET    Take 75-112.5 mcg by mouth daily. Take 1 and 1/2 tablets on Saturdays then take 1 tablet all the other days   LORATADINE (CLARITIN) 10 MG TABLET    Take 10 mg by mouth daily.    LOSARTAN (COZAAR) 25 MG TABLET    Take 25 mg by mouth daily.   LUBIPROSTONE (AMITIZA) 24 MCG CAPSULE    Take 24 mcg by mouth 2 (two) times daily with a meal.     MEMANTINE HCL ER (NAMENDA XR) 28 MG CP24    Take by mouth.   METHYLCELLULOSE (ARTIFICIAL TEARS) 1 % OPHTHALMIC SOLUTION    Place 1 drop into both eyes 4 (four) times daily as needed (for dryness).   MULTIPLE  VITAMINS-MINERALS (CERTAVITE SENIOR/ANTIOXIDANT PO)    Take 1 tablet by mouth daily.   NITROGLYCERIN (NITROSTAT) 0.4 MG SL TABLET    Place 0.4 mg under the tongue every 5 (five) minutes as needed. For chest pain   NUTRITIONAL SUPPLEMENTS (RESOURCE 2.0 PO)    Take 120 mLs by mouth 3 (three) times daily.   OMEPRAZOLE (PRILOSEC) 20 MG CAPSULE    Take 20 mg by mouth 2 (two) times daily.   PROBIOTIC PRODUCT (ALIGN) 4 MG CAPS    Take 1 capsule by mouth daily.   PROPRANOLOL (INDERAL) 20 MG TABLET    Take 20 mg by mouth 2 (two) times daily.   SPIRONOLACTONE (ALDACTONE) 25 MG TABLET    Take 25 mg by mouth daily.    VITAMIN D, ERGOCALCIFEROL, (DRISDOL) 50000 UNITS CAPS    Take 50,000 Units by mouth every 30 (thirty) days. On the first of the month  Modified Medications   No medications on file  Discontinued Medications   No medications on file     Physical Exam: Physical Exam  Constitutional: She appears well-developed.  HENT:  Head: Normocephalic and atraumatic.  Left Ear: External ear normal.  Eyes: Conjunctivae and EOM are normal. Pupils are equal, round, and reactive to light.  Neck: Neck supple. No JVD present. No thyromegaly present.  Cardiovascular: Normal rate and regular rhythm.   Murmur heard. Pulmonary/Chest: Effort normal. She has rales.  Bibasilar rales.   Abdominal: Soft. There is no tenderness. There is no rebound.  Musculoskeletal: She exhibits edema.  Back pain is chronic which is well controlled. BLE chronic edema 1+. Left mid plantar aspect foot pain-worsens with weight bearing.   Lymphadenopathy:    She has no cervical adenopathy.  Neurological: She is alert. No cranial nerve deficit. Coordination normal.  Skin: Skin is warm and dry. Rash noted.  Chronic  venous dermatitis in BLE. R+L Intergluteal cleft recurrent pressure ulcers in mirror image about a quarter size.  Left forefoot diabetic ulcer--healed. Excoriated urogenital/buttocks/perinum.    Psychiatric: Her mood  appears anxious. Her affect is not blunt, not labile and not inappropriate. Her speech is rapid and/or pressured (occasionally ). Her speech is not delayed and not slurred. She is hyperactive. She is not agitated, not aggressive, not slowed, not withdrawn, not actively hallucinating and not combative. Thought content is not paranoid and not delusional. She does not exhibit a depressed mood. She exhibits abnormal recent memory.  MMSE 21/30 08/2013    Filed Vitals:   07/22/14 1444  BP: 142/70  Pulse: 80  Temp: 97 F (36.1 C)  TempSrc: Tympanic  Resp: 18      Labs reviewed: Basic Metabolic Panel:  Recent Labs  08/26/13  03/06/14 04/20/14 07/03/14  NA  --   < > 138 136* 137  K  --   < > 4.6 4.9 4.2  BUN  --   < > 33* 30* 26*  CREATININE  --   < > 0.9 0.8 0.8  TSH 2.07  --  4.31  --  3.77  < > = values in this interval not displayed. Liver Function Tests:  Recent Labs  11/21/13 03/06/14 04/20/14  AST 17 20 19   ALT 9 8 12   ALKPHOS 67 85 79   CBC:  Recent Labs  12/23/13 03/06/14 04/20/14  WBC 6.7 5.8 6.4  HGB 13.3 13.4 14.3  HCT 40 41 42  PLT 268 214 260    Past Procedures:  10/22/13 CXR patchy interstitial changes left lower lung most consistent with pneumonia  11/21/13 CXR minimal cardiomegaly slightly increased without pulmonary vascular congestion or pleural effusion, no inflammatory consolidate or suspicious nodule, interval resolution of interstitial pneumonitis at the left lower lung.   01/29/14 for minor head injury and dementia: head CT w/o contrast: no acute findings.   04/18/14 X-ray L knee/ankle/tibia/fibula: no acute fracture or dislocation  07/18/14 X-ray L foot: the findings could be consistent with inflammatory process or osteomyelitis. Neuropathic arthropathy not excluded.   Assessment/Plan Plantar fasciitis of left foot 07/18/14 X-ray L foot: the findings could be consistent with inflammatory process or osteomyelitis. Neuropathic arthropathy not excluded.  MyoFlex qid-observe.   Dementia with behavioral disturbance Her MMSE 21/3010/04/2013. Given her hx of cardiovascular issues--vascular dementia is more likely. Tolerated  Namenda and increased Cymbalta to 30mg  bid. Mood is better controlled. Gradual decline in her cognition noted. Lack of safety awareness contributed to her frequent falling.    HTN (hypertension) Controlled, continue Losartan 25mg  daily, Atenolol, monitor blood pressure daily. Dr. Mare Ferrari 02/03/14 continue same cardiac meds.     Back pain Chronic-coccyalgia in nature, pain is controlled with Fentanyl 158mcg/hr     Edema Chronic venous insufficiency. Takes Furosemide 40mg  bid and Spironolctone25mg  daily  Essential tremor Mild head titubation and hands tremor-not disabling. Takes Propranolol 10mg  bid    Depression Better with Cymbalta 30mg  bid and Namenda. Observe the patient.     Ischemic heart disease angina occasionally, f/u Cardiology, continue with Imdur 60mg  and Prn NTG(last dose 02/12/13) and ASA 81mg .    Type II or unspecified type diabetes mellitus with peripheral circulatory disorders, uncontrolled(250.72) No further hypoglycemic episodes since Lantus16 units. Continue bolus Novolog 8 units bid and 12 units with lunch. Hgb A1c 7.8 03/06/14. Hgb A1c 8.1 07/03/14. Continue adjust insulin per CBGs   Unspecified hypothyroidism Corrected with Levothyroxine 112.73mcg, last TSH 2.069 08/26/13 and  4.306 03/06/14 and 3.773 07/03/14     Dyspepsia Stable, takes Omeprazole 20mg  bid.      Family/ Staff Communication: observe the patient.   Goals of Care: SNF  Labs/tests ordered: none

## 2014-07-22 NOTE — Assessment & Plan Note (Signed)
Chronic-coccyalgia in nature, pain is controlled with Fentanyl 100mcg/hr   

## 2014-07-22 NOTE — Assessment & Plan Note (Signed)
Chronic venous insufficiency. Takes Furosemide 40mg bid and Spironolctone25mg daily   

## 2014-07-22 NOTE — Assessment & Plan Note (Signed)
No further hypoglycemic episodes since Lantus16 units. Continue bolus Novolog 8 units bid and 12 units with lunch. Hgb A1c 7.8 03/06/14. Hgb A1c 8.1 07/03/14. Continue adjust insulin per CBGs

## 2014-07-22 NOTE — Assessment & Plan Note (Signed)
Mild head titubation and hands tremor-not disabling. Takes Propranolol 10mg bid  

## 2014-07-22 NOTE — Assessment & Plan Note (Signed)
Corrected with Levothyroxine 112.5mcg, last TSH 2.069 08/26/13 and 4.306 03/06/14 and 3.773 07/03/14  

## 2014-07-29 ENCOUNTER — Encounter: Payer: Self-pay | Admitting: Nurse Practitioner

## 2014-08-20 ENCOUNTER — Other Ambulatory Visit: Payer: Self-pay | Admitting: *Deleted

## 2014-08-20 MED ORDER — FENTANYL 100 MCG/HR TD PT72: MEDICATED_PATCH | TRANSDERMAL | Status: DC

## 2014-08-20 NOTE — Telephone Encounter (Signed)
[facility] 

## 2014-09-02 ENCOUNTER — Encounter: Payer: Self-pay | Admitting: Nurse Practitioner

## 2014-09-12 ENCOUNTER — Encounter: Payer: Self-pay | Admitting: Nurse Practitioner

## 2014-09-12 ENCOUNTER — Non-Acute Institutional Stay (SKILLED_NURSING_FACILITY): Payer: Medicare Other | Admitting: Nurse Practitioner

## 2014-09-12 DIAGNOSIS — M722 Plantar fascial fibromatosis: Secondary | ICD-10-CM

## 2014-09-12 DIAGNOSIS — I259 Chronic ischemic heart disease, unspecified: Secondary | ICD-10-CM

## 2014-09-12 DIAGNOSIS — I482 Chronic atrial fibrillation, unspecified: Secondary | ICD-10-CM

## 2014-09-12 DIAGNOSIS — F0391 Unspecified dementia with behavioral disturbance: Secondary | ICD-10-CM

## 2014-09-12 DIAGNOSIS — F32A Depression, unspecified: Secondary | ICD-10-CM

## 2014-09-12 DIAGNOSIS — F03918 Unspecified dementia, unspecified severity, with other behavioral disturbance: Secondary | ICD-10-CM

## 2014-09-12 DIAGNOSIS — I10 Essential (primary) hypertension: Secondary | ICD-10-CM

## 2014-09-12 DIAGNOSIS — K5900 Constipation, unspecified: Secondary | ICD-10-CM

## 2014-09-12 DIAGNOSIS — R609 Edema, unspecified: Secondary | ICD-10-CM

## 2014-09-12 DIAGNOSIS — G250 Essential tremor: Secondary | ICD-10-CM

## 2014-09-12 DIAGNOSIS — R1013 Epigastric pain: Secondary | ICD-10-CM

## 2014-09-12 DIAGNOSIS — M5440 Lumbago with sciatica, unspecified side: Secondary | ICD-10-CM

## 2014-09-12 DIAGNOSIS — E039 Hypothyroidism, unspecified: Secondary | ICD-10-CM

## 2014-09-12 DIAGNOSIS — F329 Major depressive disorder, single episode, unspecified: Secondary | ICD-10-CM

## 2014-09-12 NOTE — Assessment & Plan Note (Signed)
Chronic-coccyalgia in nature, pain is controlled with Fentanyl 100mcg/hr   

## 2014-09-12 NOTE — Assessment & Plan Note (Signed)
Stable, takes colace 100 II qhs and Linzess(off Amitiza 24mcg bid-therapeutic exchange by Pharm )and MiraLax prn.  

## 2014-09-12 NOTE — Assessment & Plan Note (Signed)
Controlled, continue Losartan 25mg daily, Atenolol, monitor blood pressure daily. Dr. Brackbill 02/03/14 continue same cardiac meds.    

## 2014-09-12 NOTE — Assessment & Plan Note (Signed)
Better with Cymbalta 30mg  bid and Namenda. Observe the patient.   06/24/14 dc Ativan: infrequently use(1 dose in 3 months)

## 2014-09-12 NOTE — Assessment & Plan Note (Signed)
angina occasionally, f/u Cardiology, continue with Imdur 60mg and Prn NTG(last dose 02/12/13) and ASA 81mg.    

## 2014-09-12 NOTE — Assessment & Plan Note (Signed)
Rate controlled. Propranolol was increased to 20mg bid for better rate control in setting of HTN and essential tremor. Saw Dr. Brackbill 02/03/14 doing well, EKG A-fib with controlled VR. 1year office visit EKG  

## 2014-09-12 NOTE — Assessment & Plan Note (Signed)
Chronic venous insufficiency. Takes Furosemide 40mg bid and Spironolctone25mg daily   

## 2014-09-12 NOTE — Progress Notes (Signed)
Patient ID: [name], female   DOB: 1926-05-18, 80 y.o.   MRN: [id number]   Code Status: DNR  Allergies  Allergen Reactions  . Penicillins Other (See Comments)    Per MAR  . Bee Venom Other (See Comments)    Per MAR  . Celebrex [Celecoxib] Other (See Comments)    Per MAR  . Lidocaine Hcl Other (See Comments)    Per MAR  . Phenobarbital Other (See Comments)    Per MAR  . Procaine Hcl Other (See Comments)    Per mar    Chief Complaint  Patient presents with  . Medical Management of Chronic Issues    HPI: Patient is a 80 y.o. female seen in the SNF at [facility] today for evaluation of chronic medical conditions.     ED 01/29/14 for minor head injury and dementia: head CT w/o contrast: no acute findings.  Problem List Items Addressed This Visit   Plantar fasciitis of left foot     07/18/14 X-ray L foot: the findings could be consistent with inflammatory process or osteomyelitis. Neuropathic arthropathy not excluded. MyoFlex qid-observe. stable     Ischemic heart disease     angina occasionally, f/u Cardiology, continue with Imdur 60mg  and Prn NTG(last dose 02/12/13) and ASA 81mg .       Hypothyroidism - Primary     Corrected with Levothyroxine 112.20mcg, last TSH 2.069 08/26/13 and 4.306 03/06/14 and 3.773 07/03/14      HTN (hypertension)     Controlled, continue Losartan 25mg  daily, Atenolol, monitor blood pressure daily. Dr. Mare Ferrari 02/03/14 continue same cardiac meds.        Essential tremor     Mild head titubation and hands tremor-not disabling. Takes Propranolol 10mg  bid      Edema     Chronic venous insufficiency. Takes Furosemide 40mg  bid and Spironolctone25mg  daily     Dyspepsia     Stable, takes Omeprazole 20mg  bid.       Depression     Better with Cymbalta 30mg  bid and Namenda. Observe the patient.   06/24/14 dc Ativan: infrequently use(1 dose in 3 months)     Dementia with behavioral disturbance     Her MMSE 21/3010/04/2013. Given her hx of  cardiovascular issues--vascular dementia is more likely. Tolerated  Namenda and increased Cymbalta to 30mg  bid. Mood is better controlled. Gradual decline in her cognition noted. Lack of safety awareness contributed to her frequent falling.       Constipation     Stable, takes colace 100 II qhs and Linzess(off Amitiza 70mcg bid-therapeutic exchange by Pharm )and MiraLax prn.     Back pain     Chronic-coccyalgia in nature, pain is controlled with Fentanyl 168mcg/hr        A-fib     Rate controlled. Propranolol was increased to 20mg  bid for better rate control in setting of HTN and essential tremor. Saw Dr. Mare Ferrari 02/03/14 doing well, EKG A-fib with controlled VR. 1year office visit EKG         Review of Systems:  Review of Systems  Constitutional: Negative for fever, chills, weight loss, malaise/fatigue and diaphoresis.  HENT: Positive for hearing loss. Negative for congestion, ear discharge and sore throat.   Eyes: Negative for pain, discharge and redness.  Respiratory: Negative for cough, sputum production, shortness of breath and wheezing.   Cardiovascular: Positive for leg swelling and PND. Negative for chest pain, palpitations, orthopnea and claudication.       1+  Gastrointestinal:  Negative for heartburn, nausea, vomiting, abdominal pain, diarrhea, constipation and blood in stool.       Fecal incontinence  Genitourinary: Positive for frequency. Negative for dysuria, urgency and flank pain.       Leakage.   Musculoskeletal: Positive for back pain, falls and joint pain. Negative for myalgias and neck pain.       Able to ambulate with walker. Falls 03/05/14, 05/22/14, 06/29/14. C/o left foot pain in the mid plantar aspect.   Skin: Positive for rash (chronic BLE venous dermatitis. Left medial forefoot diabetic foot ulcer). Negative for itching.       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 R+L Intergluteal cleft recurrent pressure ulcers in mirror image about a quarter size.  Left  forefoot diabetic ulcer--healed. Excoriated urogenital/buttocks/perinum is chronic.   Neurological: Positive for tremors. Negative for dizziness, tingling, sensory change, speech change, focal weakness, seizures, loss of consciousness, weakness and headaches.  Endo/Heme/Allergies: Negative for environmental allergies and polydipsia. Does not bruise/bleed easily.  Psychiatric/Behavioral: Positive for depression and memory loss. Negative for hallucinations. The patient is nervous/anxious and has insomnia.      Past Medical History  Diagnosis Date  . Diabetes mellitus   . Hypertension   . Heart murmur   . Benign familial tremor   . Rosacea   . Diverticulitis   . Heart failure     diastolic heart failure  . TIA (transient ischemic attack) 2010  . Arthritis   . Breast cancer     breast -rt  . CAD (coronary artery disease)     single vessel CAD per cath in 2006; scattered nonobstructive disease in the left system, left to right collaterals to the RCA which was occluded by flush shots; she has been managed medically   Past Surgical History  Procedure Laterality Date  . Mastectomy partial / lumpectomy  11/02/2000    Right, with SLN  . Mastectomy partial / lumpectomy  05/15/2008    Left  . Breast lumpectomy  01/19/2010    right  . Mastectomy  03/31/2010    Right   Social History:   reports that she has never smoked. She does not have any smokeless tobacco history on file. She reports that she does not drink alcohol or use illicit drugs.  Medications: Patient's Medications  New Prescriptions   No medications on file  Previous Medications   ACETAMINOPHEN (TYLENOL) 325 MG TABLET    Take 2 tablets (650 mg total) by mouth every 4 (four) hours as needed.   ALUM & MAG HYDROXIDE-SIMETH (MAALOX/MYLANTA) 200-200-20 MG/5ML SUSPENSION    Take 30 mLs by mouth every 6 (six) hours as needed for indigestion or heartburn.   ASPIRIN EC 81 MG TABLET    Take 81 mg by mouth daily.   AYR SALINE NASAL  NO-DRIP GEL    Place into the nose 2 (two) times daily as needed (nose bleeds).   DOCUSATE SODIUM (COLACE) 100 MG CAPSULE    Take 200 mg by mouth at bedtime.    DORZOLAMIDE-TIMOLOL (COSOPT) 22.3-6.8 MG/ML OPHTHALMIC SOLUTION    Place 1 drop into the left eye every 12 (twelve) hours.   DULOXETINE (CYMBALTA) 30 MG CAPSULE    Take 30 mg by mouth 2 (two) times daily.   FENTANYL (DURAGESIC - DOSED MCG/HR) 100 MCG/HR    Apply one patch topically every 3 days for pain   FUROSEMIDE (LASIX) 40 MG TABLET    Take 40 mg by mouth 2 (two) times daily.    GLUCAGON (GLUCAGEN)  1 MG SOLR INJECTION    Inject 1 mg into the vein once as needed for low blood sugar.   GLUCOSAMINE-CHONDROITIN 500-400 MG TABLET    Take 1 tablet by mouth 3 (three) times daily.   GUAIFENESIN (ROBITUSSIN) 100 MG/5ML SYRUP    Take 300 mg by mouth 3 (three) times daily as needed for cough.   HYDROXYZINE (ATARAX/VISTARIL) 25 MG TABLET    Take 25 mg by mouth at bedtime as needed. For itching   INSULIN ASPART (NOVOLOG) 100 UNIT/ML INJECTION    Inject 8-12 Units into the skin 3 (three) times daily with meals. 8 units If CBG is greater than 150 at lunch and 12 units if CBG is greater than 150 at breakfast and dinner   INSULIN GLARGINE (LANTUS) 100 UNIT/ML INJECTION    Inject 16 Units into the skin at bedtime.    ISOSORBIDE MONONITRATE (IMDUR) 60 MG 24 HR TABLET    Take 60 mg by mouth every morning.    LEVOTHYROXINE (SYNTHROID, LEVOTHROID) 75 MCG TABLET    Take 75-112.5 mcg by mouth daily. Take 1 and 1/2 tablets on Saturdays then take 1 tablet all the other days   LORATADINE (CLARITIN) 10 MG TABLET    Take 10 mg by mouth daily.    LOSARTAN (COZAAR) 25 MG TABLET    Take 25 mg by mouth daily.   LUBIPROSTONE (AMITIZA) 24 MCG CAPSULE    Take 24 mcg by mouth 2 (two) times daily with a meal.     MEMANTINE HCL ER (NAMENDA XR) 28 MG CP24    Take by mouth.   METHYLCELLULOSE (ARTIFICIAL TEARS) 1 % OPHTHALMIC SOLUTION    Place 1 drop into both eyes 4 (four)  times daily as needed (for dryness).   MULTIPLE VITAMINS-MINERALS (CERTAVITE SENIOR/ANTIOXIDANT PO)    Take 1 tablet by mouth daily.   NITROGLYCERIN (NITROSTAT) 0.4 MG SL TABLET    Place 0.4 mg under the tongue every 5 (five) minutes as needed. For chest pain   NUTRITIONAL SUPPLEMENTS (RESOURCE 2.0 PO)    Take 120 mLs by mouth 3 (three) times daily.   OMEPRAZOLE (PRILOSEC) 20 MG CAPSULE    Take 20 mg by mouth 2 (two) times daily.   PROBIOTIC PRODUCT (ALIGN) 4 MG CAPS    Take 1 capsule by mouth daily.   PROPRANOLOL (INDERAL) 20 MG TABLET    Take 20 mg by mouth 2 (two) times daily.   SPIRONOLACTONE (ALDACTONE) 25 MG TABLET    Take 25 mg by mouth daily.    VITAMIN D, ERGOCALCIFEROL, (DRISDOL) 50000 UNITS CAPS    Take 50,000 Units by mouth every 30 (thirty) days. On the first of the month  Modified Medications   No medications on file  Discontinued Medications   No medications on file     Physical Exam: Physical Exam  Constitutional: She appears well-developed.  HENT:  Head: Normocephalic and atraumatic.  Left Ear: External ear normal.  Eyes: Conjunctivae and EOM are normal. Pupils are equal, round, and reactive to light.  Neck: Neck supple. No JVD present. No thyromegaly present.  Cardiovascular: Normal rate and regular rhythm.   Murmur heard. Pulmonary/Chest: Effort normal. She has rales.  Bibasilar rales.   Abdominal: Soft. There is no tenderness. There is no rebound.  Musculoskeletal: She exhibits edema.  Back pain is chronic which is well controlled. BLE chronic edema 1+. Left mid plantar aspect foot pain-worsens with weight bearing.   Lymphadenopathy:    She has no cervical adenopathy.  Neurological: She is alert. No cranial nerve deficit. Coordination normal.  Skin: Skin is warm and dry. Rash noted.  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 R+L Intergluteal cleft recurrent pressure ulcers in mirror image about a quarter size.  Left forefoot diabetic ulcer--healed. Excoriated  urogenital/buttocks/perinum is chronic.    Psychiatric: Her mood appears anxious. Her affect is not blunt, not labile and not inappropriate. Her speech is rapid and/or pressured (occasionally ). Her speech is not delayed and not slurred. She is hyperactive. She is not agitated, not aggressive, not slowed, not withdrawn, not actively hallucinating and not combative. Thought content is not paranoid and not delusional. She does not exhibit a depressed mood. She exhibits abnormal recent memory.  MMSE 21/30 08/2013    Filed Vitals:   09/12/14 1459  BP: 132/79  Pulse: 82  Temp: 97 F (36.1 C)  TempSrc: Tympanic  Resp: 18      Labs reviewed: Basic Metabolic Panel:  Recent Labs  03/06/14 04/20/14 07/03/14  NA 138 136* 137  K 4.6 4.9 4.2  BUN 33* 30* 26*  CREATININE 0.9 0.8 0.8  TSH 4.31  --  3.77   Liver Function Tests:  Recent Labs  11/21/13 03/06/14 04/20/14  AST 17 20 19   ALT 9 8 12   ALKPHOS 67 85 79   CBC:  Recent Labs  12/23/13 03/06/14 04/20/14  WBC 6.7 5.8 6.4  HGB 13.3 13.4 14.3  HCT 40 41 42  PLT 268 214 260    Past Procedures:  10/22/13 CXR patchy interstitial changes left lower lung most consistent with pneumonia  11/21/13 CXR minimal cardiomegaly slightly increased without pulmonary vascular congestion or pleural effusion, no inflammatory consolidate or suspicious nodule, interval resolution of interstitial pneumonitis at the left lower lung.   01/29/14 for minor head injury and dementia: head CT w/o contrast: no acute findings.   04/18/14 X-ray L knee/ankle/tibia/fibula: no acute fracture or dislocation  07/18/14 X-ray L foot: the findings could be consistent with inflammatory process or osteomyelitis. Neuropathic arthropathy not excluded.   Assessment/Plan Hypothyroidism Corrected with Levothyroxine 112.73mcg, last TSH 2.069 08/26/13 and 4.306 03/06/14 and 3.773 07/03/14    HTN (hypertension) Controlled, continue Losartan 25mg  daily, Atenolol, monitor  blood pressure daily. Dr. Mare Ferrari 02/03/14 continue same cardiac meds.      Essential tremor Mild head titubation and hands tremor-not disabling. Takes Propranolol 10mg  bid    Edema Chronic venous insufficiency. Takes Furosemide 40mg  bid and Spironolctone25mg  daily   Dyspepsia Stable, takes Omeprazole 20mg  bid.     Depression Better with Cymbalta 30mg  bid and Namenda. Observe the patient.   06/24/14 dc Ativan: infrequently use(1 dose in 3 months)   Dementia with behavioral disturbance Her MMSE 21/3010/04/2013. Given her hx of cardiovascular issues--vascular dementia is more likely. Tolerated  Namenda and increased Cymbalta to 30mg  bid. Mood is better controlled. Gradual decline in her cognition noted. Lack of safety awareness contributed to her frequent falling.     Back pain Chronic-coccyalgia in nature, pain is controlled with Fentanyl 132mcg/hr      A-fib Rate controlled. Propranolol was increased to 20mg  bid for better rate control in setting of HTN and essential tremor. Saw Dr. Mare Ferrari 02/03/14 doing well, EKG A-fib with controlled VR. 1year office visit EKG    Constipation Stable, takes colace 100 II qhs and Linzess(off Amitiza 81mcg bid-therapeutic exchange by Pharm )and MiraLax prn.   Plantar fasciitis of left foot 07/18/14 X-ray L foot: the findings could be consistent with inflammatory process or osteomyelitis. Neuropathic arthropathy  not excluded. MyoFlex qid-observe. stable   Ischemic heart disease angina occasionally, f/u Cardiology, continue with Imdur 60mg  and Prn NTG(last dose 02/12/13) and ASA 81mg .       Family/ Staff Communication: observe the patient.   Goals of Care: SNF  Labs/tests ordered: none

## 2014-09-12 NOTE — Assessment & Plan Note (Signed)
Corrected with Levothyroxine 112.5mcg, last TSH 2.069 08/26/13 and 4.306 03/06/14 and 3.773 07/03/14  

## 2014-09-12 NOTE — Assessment & Plan Note (Signed)
Her MMSE 21/3010/04/2013. Given her hx of cardiovascular issues--vascular dementia is more likely. Tolerated  Namenda and increased Cymbalta to 30mg bid. Mood is better controlled. Gradual decline in her cognition noted. Lack of safety awareness contributed to her frequent falling.    

## 2014-09-12 NOTE — Assessment & Plan Note (Signed)
Mild head titubation and hands tremor-not disabling. Takes Propranolol 10mg bid  

## 2014-09-12 NOTE — Assessment & Plan Note (Signed)
07/18/14 X-ray L foot: the findings could be consistent with inflammatory process or osteomyelitis. Neuropathic arthropathy not excluded. MyoFlex qid-observe. stable  

## 2014-09-12 NOTE — Assessment & Plan Note (Signed)
Stable, takes Omeprazole 20mg bid.  

## 2014-09-30 ENCOUNTER — Non-Acute Institutional Stay (SKILLED_NURSING_FACILITY): Payer: Medicare Other | Admitting: Nurse Practitioner

## 2014-09-30 ENCOUNTER — Encounter: Payer: Self-pay | Admitting: Nurse Practitioner

## 2014-09-30 DIAGNOSIS — F03918 Unspecified dementia, unspecified severity, with other behavioral disturbance: Secondary | ICD-10-CM

## 2014-09-30 DIAGNOSIS — F32A Depression, unspecified: Secondary | ICD-10-CM

## 2014-09-30 DIAGNOSIS — I482 Chronic atrial fibrillation, unspecified: Secondary | ICD-10-CM

## 2014-09-30 DIAGNOSIS — K5900 Constipation, unspecified: Secondary | ICD-10-CM

## 2014-09-30 DIAGNOSIS — B029 Zoster without complications: Secondary | ICD-10-CM

## 2014-09-30 DIAGNOSIS — G250 Essential tremor: Secondary | ICD-10-CM

## 2014-09-30 DIAGNOSIS — F0391 Unspecified dementia with behavioral disturbance: Secondary | ICD-10-CM

## 2014-09-30 DIAGNOSIS — E1142 Type 2 diabetes mellitus with diabetic polyneuropathy: Secondary | ICD-10-CM

## 2014-09-30 DIAGNOSIS — M5440 Lumbago with sciatica, unspecified side: Secondary | ICD-10-CM

## 2014-09-30 DIAGNOSIS — R1013 Epigastric pain: Secondary | ICD-10-CM

## 2014-09-30 DIAGNOSIS — R609 Edema, unspecified: Secondary | ICD-10-CM

## 2014-09-30 DIAGNOSIS — I10 Essential (primary) hypertension: Secondary | ICD-10-CM

## 2014-09-30 DIAGNOSIS — F329 Major depressive disorder, single episode, unspecified: Secondary | ICD-10-CM

## 2014-09-30 DIAGNOSIS — R0789 Other chest pain: Secondary | ICD-10-CM | POA: Insufficient documentation

## 2014-09-30 DIAGNOSIS — E039 Hypothyroidism, unspecified: Secondary | ICD-10-CM

## 2014-09-30 DIAGNOSIS — R296 Repeated falls: Secondary | ICD-10-CM

## 2014-09-30 NOTE — Assessment & Plan Note (Signed)
Stable, takes colace 100 II qhs and Linzess(off Amitiza 24mcg bid-therapeutic exchange by Pharm )and MiraLax prn.  

## 2014-09-30 NOTE — Assessment & Plan Note (Signed)
07/29/14 decreased Omeprazole to 20mg daily-consultant pharmacist 09/30/14 stable.    

## 2014-09-30 NOTE — Assessment & Plan Note (Signed)
Corrected with Levothyroxine 112.5mcg, last TSH 2.069 08/26/13 and 4.306 03/06/14 and 3.773 07/03/14  

## 2014-09-30 NOTE — Assessment & Plan Note (Signed)
Better with Cymbalta 30mg  bid and Namenda. Observe the patient.   06/24/14 dc Ativan: infrequently use(1 dose in 3 months)

## 2014-09-30 NOTE — Progress Notes (Signed)
Patient ID: [name], female   DOB: 09-06-1926, 80 y.o.   MRN: [id number]   Code Status: DNR  Allergies  Allergen Reactions  . Penicillins Other (See Comments)    Per MAR  . Bee Venom Other (See Comments)    Per MAR  . Celebrex [Celecoxib] Other (See Comments)    Per MAR  . Lidocaine Hcl Other (See Comments)    Per MAR  . Phenobarbital Other (See Comments)    Per MAR  . Procaine Hcl Other (See Comments)    Per mar    Chief Complaint  Patient presents with  . Medical Management of Chronic Issues  . Acute Visit    fall, posterior right flank painful rash, right lower rib pain.     HPI: Patient is a 80 y.o. female seen in the SNF at [facility] today for evaluation of right flank/lower ribs pain, s/p fall, posterior flank painful rash, and chronic medical conditions.     ED 01/29/14 for minor head injury and dementia: head CT w/o contrast: no acute findings.  Problem List Items Addressed This Visit    Type 2 diabetes mellitus with diabetic neuropathy affecting both sides of body (Chronic)    No further hypoglycemic episodes since Lantus16 units. Continue bolus Novolog 8 units bid and 12 units with lunch. Hgb A1c 7.8 03/06/14. Hgb A1c 8.1 07/03/14. Continue adjust insulin per CBGs 11/171/5 CBG am 84, 80, 50, 163 will decrease Lantus to 14units.       Right-sided chest wall pain    09/30/14 new, started c/o since fall this am: posterior flank painful rash noted. The patient attributed to her fall from this am. She does have pain with truncal movement or deep breath. Will obtain CXR and X-ray R ribs to evaluate further. Also update CBC, CMP, UA C/A.     Hypothyroidism    Corrected with Levothyroxine 112.40mcg, last TSH 2.069 08/26/13 and 4.306 03/06/14 and 3.773 07/03/14      HTN (hypertension)    Controlled, continue Losartan 25mg  daily, Atenolol, monitor blood pressure daily. Dr. Mare Ferrari 02/03/14 continue same cardiac meds.       Herpes zoster    09/30/14 noted a  clustered painful rash posterior right flank doesn't represent traumatic bruising which the attributed to. Will empirical tx: Valacyclovir 1000mg  q12h x 10days. Adding Morphine prn for breakthrough pain.     Falls frequently - Primary    Frequent falling due to her lack of safety awareness and increased physical frailty.     Essential tremor    Mild head titubation and hands tremor-not disabling. Takes Propranolol 10mg  bid      Edema    Chronic venous insufficiency. Takes Furosemide 40mg  bid and Spironolctone25mg  daily      Dyspepsia    07/29/14 decreased Omeprazole to 20mg  daily-consultant pharmacist 09/30/14 stable.      Depression    Better with Cymbalta 30mg  bid and Namenda. Observe the patient.   06/24/14 dc Ativan: infrequently use(1 dose in 3 months)      Dementia with behavioral disturbance    Her MMSE 21/3010/04/2013. Given her hx of cardiovascular issues--vascular dementia is more likely. Tolerated  Namenda and increased Cymbalta to 30mg  bid. Mood is better controlled. Gradual decline in her cognition noted. Lack of safety awareness contributed to her frequent falling.        Constipation    Stable, takes colace 100 II qhs and Linzess(off Amitiza 75mcg bid-therapeutic exchange by Pharm )and MiraLax prn.  Back pain    Chronic-coccyalgia in nature, pain is controlled with Fentanyl 164mcg/hr       Relevant Medications      morphine (ROXANOL) 20 MG/ML concentrated solution   A-fib    Rate controlled. Propranolol was increased to 20mg  bid for better rate control in setting of HTN and essential tremor. Saw Dr. Mare Ferrari 02/03/14 doing well, EKG A-fib with controlled VR. 1year office visit EKG         Review of Systems:  Review of Systems  Constitutional: Negative for fever, chills, weight loss, malaise/fatigue and diaphoresis.  HENT: Positive for hearing loss. Negative for congestion, ear discharge and sore throat.   Eyes: Negative for pain, discharge and  redness.  Respiratory: Negative for cough, sputum production, shortness of breath and wheezing.   Cardiovascular: Positive for leg swelling and PND. Negative for chest pain, palpitations, orthopnea and claudication.       1+  Gastrointestinal: Negative for heartburn, nausea, vomiting, abdominal pain, diarrhea, constipation and blood in stool.       Fecal incontinence  Genitourinary: Positive for frequency. Negative for dysuria, urgency and flank pain.       Leakage.   Musculoskeletal: Positive for back pain, joint pain and falls. Negative for myalgias and neck pain.       Able to ambulate with walker. Falls 03/05/14, 05/22/14, 06/29/14. 09/30/14  Skin: Positive for rash (chronic BLE venous dermatitis. Left medial forefoot diabetic foot ulcer). Negative for itching.       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 R+L Intergluteal cleft recurrent pressure ulcers in mirror image-much smaller.  Left forefoot diabetic ulcer--healed. Excoriated urogenital/buttocks/perinum is chronic. New posterior flank clustered rash-painful and small amount drainage seen in the lower part in the skin fold(doesn't present bruise from traumatic falling-but the patient insisted it started hurting after her fall this am).   Neurological: Positive for tremors. Negative for dizziness, tingling, sensory change, speech change, focal weakness, seizures, loss of consciousness, weakness and headaches.  Endo/Heme/Allergies: Negative for environmental allergies and polydipsia. Does not bruise/bleed easily.  Psychiatric/Behavioral: Positive for depression and memory loss. Negative for hallucinations. The patient is nervous/anxious and has insomnia.      Past Medical History  Diagnosis Date  . Diabetes mellitus   . Hypertension   . Heart murmur   . Benign familial tremor   . Rosacea   . Diverticulitis   . Heart failure     diastolic heart failure  . TIA (transient ischemic attack) 2010  . Arthritis   . Breast cancer     breast -rt    . CAD (coronary artery disease)     single vessel CAD per cath in 2006; scattered nonobstructive disease in the left system, left to right collaterals to the RCA which was occluded by flush shots; she has been managed medically   Past Surgical History  Procedure Laterality Date  . Mastectomy partial / lumpectomy  11/02/2000    Right, with SLN  . Mastectomy partial / lumpectomy  05/15/2008    Left  . Breast lumpectomy  01/19/2010    right  . Mastectomy  03/31/2010    Right   Social History:   reports that she has never smoked. She does not have any smokeless tobacco history on file. She reports that she does not drink alcohol or use illicit drugs.  Medications: Patient's Medications  New Prescriptions   No medications on file  Previous Medications   ACETAMINOPHEN (TYLENOL) 325 MG TABLET    Take 2  tablets (650 mg total) by mouth every 4 (four) hours as needed.   ALUM & MAG HYDROXIDE-SIMETH (MAALOX/MYLANTA) 200-200-20 MG/5ML SUSPENSION    Take 30 mLs by mouth every 6 (six) hours as needed for indigestion or heartburn.   ASPIRIN EC 81 MG TABLET    Take 81 mg by mouth daily.   AYR SALINE NASAL NO-DRIP GEL    Place into the nose 2 (two) times daily as needed (nose bleeds).   DOCUSATE SODIUM (COLACE) 100 MG CAPSULE    Take 200 mg by mouth at bedtime.    DORZOLAMIDE-TIMOLOL (COSOPT) 22.3-6.8 MG/ML OPHTHALMIC SOLUTION    Place 1 drop into the left eye every 12 (twelve) hours.   DULOXETINE (CYMBALTA) 30 MG CAPSULE    Take 30 mg by mouth 2 (two) times daily.   FENTANYL (DURAGESIC - DOSED MCG/HR) 100 MCG/HR    Apply one patch topically every 3 days for pain   FUROSEMIDE (LASIX) 40 MG TABLET    Take 40 mg by mouth 2 (two) times daily.    GLUCAGON (GLUCAGEN) 1 MG SOLR INJECTION    Inject 1 mg into the vein once as needed for low blood sugar.   GLUCOSAMINE-CHONDROITIN 500-400 MG TABLET    Take 1 tablet by mouth 3 (three) times daily.   GUAIFENESIN (ROBITUSSIN) 100 MG/5ML SYRUP    Take 300 mg by mouth  3 (three) times daily as needed for cough.   HYDROXYZINE (ATARAX/VISTARIL) 25 MG TABLET    Take 25 mg by mouth at bedtime as needed. For itching   INSULIN ASPART (NOVOLOG) 100 UNIT/ML INJECTION    Inject 8-12 Units into the skin 3 (three) times daily with meals. 8 units If CBG is greater than 150 at lunch and 12 units if CBG is greater than 150 at breakfast and dinner   INSULIN GLARGINE (LANTUS) 100 UNIT/ML INJECTION    Inject 16 Units into the skin at bedtime.    ISOSORBIDE MONONITRATE (IMDUR) 60 MG 24 HR TABLET    Take 60 mg by mouth every morning.    LEVOTHYROXINE (SYNTHROID, LEVOTHROID) 75 MCG TABLET    Take 75-112.5 mcg by mouth daily. Take 1 and 1/2 tablets on Saturdays then take 1 tablet all the other days   LORATADINE (CLARITIN) 10 MG TABLET    Take 10 mg by mouth daily.    LOSARTAN (COZAAR) 25 MG TABLET    Take 25 mg by mouth daily.   LUBIPROSTONE (AMITIZA) 24 MCG CAPSULE    Take 24 mcg by mouth 2 (two) times daily with a meal.     MEMANTINE HCL ER (NAMENDA XR) 28 MG CP24    Take by mouth.   METHYLCELLULOSE (ARTIFICIAL TEARS) 1 % OPHTHALMIC SOLUTION    Place 1 drop into both eyes 4 (four) times daily as needed (for dryness).   MORPHINE (ROXANOL) 20 MG/ML CONCENTRATED SOLUTION    Take 5 mg by mouth every 4 (four) hours as needed for severe pain.   MULTIPLE VITAMINS-MINERALS (CERTAVITE SENIOR/ANTIOXIDANT PO)    Take 1 tablet by mouth daily.   NITROGLYCERIN (NITROSTAT) 0.4 MG SL TABLET    Place 0.4 mg under the tongue every 5 (five) minutes as needed. For chest pain   NUTRITIONAL SUPPLEMENTS (RESOURCE 2.0 PO)    Take 120 mLs by mouth 3 (three) times daily.   OMEPRAZOLE (PRILOSEC) 20 MG CAPSULE    Take 20 mg by mouth 2 (two) times daily.   PROBIOTIC PRODUCT (ALIGN) 4 MG CAPS  Take 1 capsule by mouth daily.   PROPRANOLOL (INDERAL) 20 MG TABLET    Take 20 mg by mouth 2 (two) times daily.   SPIRONOLACTONE (ALDACTONE) 25 MG TABLET    Take 25 mg by mouth daily.    VITAMIN D, ERGOCALCIFEROL,  (DRISDOL) 50000 UNITS CAPS    Take 50,000 Units by mouth every 30 (thirty) days. On the first of the month  Modified Medications   No medications on file  Discontinued Medications   No medications on file     Physical Exam: Physical Exam  Constitutional: She appears well-developed.  HENT:  Head: Normocephalic and atraumatic.  Left Ear: External ear normal.  Eyes: Conjunctivae and EOM are normal. Pupils are equal, round, and reactive to light.  Neck: Neck supple. No JVD present. No thyromegaly present.  Cardiovascular: Normal rate and regular rhythm.   Murmur heard. Pulmonary/Chest: Effort normal. She has rales.  Bibasilar rales.   Abdominal: Soft. There is no tenderness. There is no rebound.  Musculoskeletal: She exhibits edema and tenderness.  Back pain is chronic which is well controlled. BLE chronic edema 1+. Left mid plantar aspect foot pain-worsens with weight bearing. Left foot external deviated deformity. Pain in the right lower ribs since fall 09/30/14-worsens with truncal movements and deep breath.   Lymphadenopathy:    She has no cervical adenopathy.  Neurological: She is alert. No cranial nerve deficit. Coordination normal.  Skin: Skin is warm and dry. Rash noted.  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 R+L Intergluteal cleft recurrent pressure ulcers in mirror image-much smaller.  Left forefoot diabetic ulcer--healed. Excoriated urogenital/buttocks/perinum is chronic. New posterior flank clustered rash-painful and small amount drainage seen in the lower part in the skin fold(doesn't present bruise from traumatic falling-but the patient insisted it started hurting after her fall this am).   Psychiatric: Her mood appears anxious. Her affect is not blunt, not labile and not inappropriate. Her speech is rapid and/or pressured (occasionally ). Her speech is not delayed and not slurred. She is hyperactive. She is not agitated, not aggressive, not slowed, not withdrawn, not actively  hallucinating and not combative. Thought content is not paranoid and not delusional. She does not exhibit a depressed mood. She exhibits abnormal recent memory.  MMSE 21/30 08/2013    Filed Vitals:   09/30/14 1048  BP: 150/98  Pulse: 74  Temp: 98.8 F (37.1 C)  TempSrc: Tympanic  Resp: 20      Labs reviewed: Basic Metabolic Panel:  Recent Labs  03/06/14 04/20/14 07/03/14  NA 138 136* 137  K 4.6 4.9 4.2  BUN 33* 30* 26*  CREATININE 0.9 0.8 0.8  TSH 4.31  --  3.77   Liver Function Tests:  Recent Labs  11/21/13 03/06/14 04/20/14  AST 17 20 19   ALT 9 8 12   ALKPHOS 67 85 79   CBC:  Recent Labs  12/23/13 03/06/14 04/20/14  WBC 6.7 5.8 6.4  HGB 13.3 13.4 14.3  HCT 40 41 42  PLT 268 214 260    Past Procedures:  10/22/13 CXR patchy interstitial changes left lower lung most consistent with pneumonia  11/21/13 CXR minimal cardiomegaly slightly increased without pulmonary vascular congestion or pleural effusion, no inflammatory consolidate or suspicious nodule, interval resolution of interstitial pneumonitis at the left lower lung.   01/29/14 for minor head injury and dementia: head CT w/o contrast: no acute findings.   04/18/14 X-ray L knee/ankle/tibia/fibula: no acute fracture or dislocation  07/18/14 X-ray L foot: the findings could be consistent  with inflammatory process or osteomyelitis. Neuropathic arthropathy not excluded.   Assessment/Plan Falls frequently Frequent falling due to her lack of safety awareness and increased physical frailty.   Type 2 diabetes mellitus with diabetic neuropathy affecting both sides of body No further hypoglycemic episodes since Lantus16 units. Continue bolus Novolog 8 units bid and 12 units with lunch. Hgb A1c 7.8 03/06/14. Hgb A1c 8.1 07/03/14. Continue adjust insulin per CBGs 11/171/5 CBG am 84, 80, 50, 163 will decrease Lantus to 14units.     Herpes zoster 09/30/14 noted a clustered painful rash posterior right flank doesn't  represent traumatic bruising which the attributed to. Will empirical tx: Valacyclovir 1000mg  q12h x 10days. Adding Morphine prn for breakthrough pain.   Right-sided chest wall pain 09/30/14 new, started c/o since fall this am: posterior flank painful rash noted. The patient attributed to her fall from this am. She does have pain with truncal movement or deep breath. Will obtain CXR and X-ray R ribs to evaluate further. Also update CBC, CMP, UA C/A.   A-fib Rate controlled. Propranolol was increased to 20mg  bid for better rate control in setting of HTN and essential tremor. Saw Dr. Mare Ferrari 02/03/14 doing well, EKG A-fib with controlled VR. 1year office visit EKG    Back pain Chronic-coccyalgia in nature, pain is controlled with Fentanyl 151mcg/hr     Constipation Stable, takes colace 100 II qhs and Linzess(off Amitiza 71mcg bid-therapeutic exchange by Pharm )and MiraLax prn.    Dementia with behavioral disturbance Her MMSE 21/3010/04/2013. Given her hx of cardiovascular issues--vascular dementia is more likely. Tolerated  Namenda and increased Cymbalta to 30mg  bid. Mood is better controlled. Gradual decline in her cognition noted. Lack of safety awareness contributed to her frequent falling.      Depression Better with Cymbalta 30mg  bid and Namenda. Observe the patient.   06/24/14 dc Ativan: infrequently use(1 dose in 3 months)    Dyspepsia 07/29/14 decreased Omeprazole to 20mg  daily-consultant pharmacist 09/30/14 stable.    Edema Chronic venous insufficiency. Takes Furosemide 40mg  bid and Spironolctone25mg  daily    Essential tremor Mild head titubation and hands tremor-not disabling. Takes Propranolol 10mg  bid    HTN (hypertension) Controlled, continue Losartan 25mg  daily, Atenolol, monitor blood pressure daily. Dr. Mare Ferrari 02/03/14 continue same cardiac meds.     Hypothyroidism Corrected with Levothyroxine 112.69mcg, last TSH 2.069 08/26/13 and 4.306 03/06/14 and  3.773 07/03/14      Family/ Staff Communication: observe the patient.   Goals of Care: SNF  Labs/tests ordered: X-ray chest, R ribs, CBC, CMP, UA C/S

## 2014-09-30 NOTE — Assessment & Plan Note (Signed)
09/30/14 noted a clustered painful rash posterior right flank doesn't represent traumatic bruising which the attributed to. Will empirical tx: Valacyclovir 1000mg  q12h x 10days. Adding Morphine prn for breakthrough pain.

## 2014-09-30 NOTE — Assessment & Plan Note (Signed)
No further hypoglycemic episodes since Lantus16 units. Continue bolus Novolog 8 units bid and 12 units with lunch. Hgb A1c 7.8 03/06/14. Hgb A1c 8.1 07/03/14. Continue adjust insulin per CBGs 11/171/5 CBG am 84, 80, 50, 163 will decrease Lantus to 14units.

## 2014-09-30 NOTE — Assessment & Plan Note (Signed)
Mild head titubation and hands tremor-not disabling. Takes Propranolol 10mg bid  

## 2014-09-30 NOTE — Assessment & Plan Note (Signed)
Rate controlled. Propranolol was increased to 20mg bid for better rate control in setting of HTN and essential tremor. Saw Dr. Brackbill 02/03/14 doing well, EKG A-fib with controlled VR. 1year office visit EKG  

## 2014-09-30 NOTE — Assessment & Plan Note (Signed)
09/30/14 new, started c/o since fall this am: posterior flank painful rash noted. The patient attributed to her fall from this am. She does have pain with truncal movement or deep breath. Will obtain CXR and X-ray R ribs to evaluate further. Also update CBC, CMP, UA C/A.

## 2014-09-30 NOTE — Assessment & Plan Note (Signed)
Chronic-coccyalgia in nature, pain is controlled with Fentanyl 100mcg/hr   

## 2014-09-30 NOTE — Assessment & Plan Note (Signed)
Controlled, continue Losartan 25mg daily, Atenolol, monitor blood pressure daily. Dr. Brackbill 02/03/14 continue same cardiac meds.    

## 2014-09-30 NOTE — Assessment & Plan Note (Signed)
Frequent falling due to her lack of safety awareness and increased physical frailty.

## 2014-09-30 NOTE — Assessment & Plan Note (Signed)
Her MMSE 21/3010/04/2013. Given her hx of cardiovascular issues--vascular dementia is more likely. Tolerated  Namenda and increased Cymbalta to 30mg bid. Mood is better controlled. Gradual decline in her cognition noted. Lack of safety awareness contributed to her frequent falling.    

## 2014-09-30 NOTE — Assessment & Plan Note (Signed)
Chronic venous insufficiency. Takes Furosemide 40mg bid and Spironolctone25mg daily   

## 2014-10-01 LAB — CBC AND DIFFERENTIAL
HEMATOCRIT: 43 % (ref 36–46)
Hemoglobin: 14.5 g/dL (ref 12.0–16.0)
PLATELETS: 229 10*3/uL (ref 150–399)
WBC: 6.6 10*3/mL

## 2014-10-01 LAB — HEPATIC FUNCTION PANEL
ALT: 13 U/L (ref 7–35)
AST: 20 U/L (ref 13–35)
Alkaline Phosphatase: 80 U/L (ref 25–125)
BILIRUBIN, TOTAL: 0.5 mg/dL

## 2014-10-01 LAB — BASIC METABOLIC PANEL
BUN: 35 mg/dL — AB (ref 4–21)
CREATININE: 0.9 mg/dL (ref 0.5–1.1)
GLUCOSE: 94 mg/dL
Potassium: 4.9 mmol/L (ref 3.4–5.3)
Sodium: 136 mmol/L — AB (ref 137–147)

## 2014-10-03 ENCOUNTER — Encounter: Payer: Self-pay | Admitting: Nurse Practitioner

## 2014-10-03 ENCOUNTER — Non-Acute Institutional Stay (SKILLED_NURSING_FACILITY): Payer: Medicare Other | Admitting: Nurse Practitioner

## 2014-10-03 DIAGNOSIS — E039 Hypothyroidism, unspecified: Secondary | ICD-10-CM

## 2014-10-03 DIAGNOSIS — M722 Plantar fascial fibromatosis: Secondary | ICD-10-CM

## 2014-10-03 DIAGNOSIS — F03918 Unspecified dementia, unspecified severity, with other behavioral disturbance: Secondary | ICD-10-CM

## 2014-10-03 DIAGNOSIS — R0789 Other chest pain: Secondary | ICD-10-CM

## 2014-10-03 DIAGNOSIS — N390 Urinary tract infection, site not specified: Secondary | ICD-10-CM

## 2014-10-03 DIAGNOSIS — E1142 Type 2 diabetes mellitus with diabetic polyneuropathy: Secondary | ICD-10-CM

## 2014-10-03 DIAGNOSIS — F0391 Unspecified dementia with behavioral disturbance: Secondary | ICD-10-CM

## 2014-10-03 DIAGNOSIS — M5440 Lumbago with sciatica, unspecified side: Secondary | ICD-10-CM

## 2014-10-03 DIAGNOSIS — G250 Essential tremor: Secondary | ICD-10-CM

## 2014-10-03 DIAGNOSIS — R609 Edema, unspecified: Secondary | ICD-10-CM

## 2014-10-03 DIAGNOSIS — I482 Chronic atrial fibrillation, unspecified: Secondary | ICD-10-CM

## 2014-10-03 DIAGNOSIS — F329 Major depressive disorder, single episode, unspecified: Secondary | ICD-10-CM

## 2014-10-03 DIAGNOSIS — B029 Zoster without complications: Secondary | ICD-10-CM

## 2014-10-03 DIAGNOSIS — F32A Depression, unspecified: Secondary | ICD-10-CM

## 2014-10-03 DIAGNOSIS — R1013 Epigastric pain: Secondary | ICD-10-CM

## 2014-10-03 DIAGNOSIS — K5900 Constipation, unspecified: Secondary | ICD-10-CM

## 2014-10-03 DIAGNOSIS — I259 Chronic ischemic heart disease, unspecified: Secondary | ICD-10-CM

## 2014-10-03 DIAGNOSIS — I10 Essential (primary) hypertension: Secondary | ICD-10-CM

## 2014-10-03 NOTE — Assessment & Plan Note (Signed)
04/22/14 Urine culture E Coli 7 day course of Cipro 500mg  bid.  10/01/14 Urine culture pos nitrate, large leukocyte esterase, many bacteria, wbc 21-50. Empirical ABT Nitrofurantoinin 100mg  bid x 7 days started 11/181/5.

## 2014-10-03 NOTE — Assessment & Plan Note (Signed)
Rate controlled. Propranolol was increased to 20mg bid for better rate control in setting of HTN and essential tremor. Saw Dr. Brackbill 02/03/14 doing well, EKG A-fib with controlled VR. 1year office visit EKG  

## 2014-10-03 NOTE — Assessment & Plan Note (Signed)
Her MMSE 21/3010/04/2013. Given her hx of cardiovascular issues--vascular dementia is more likely. Tolerated  Namenda and increased Cymbalta to 30mg bid. Mood is better controlled. Gradual decline in her cognition noted. Lack of safety awareness contributed to her frequent falling.    

## 2014-10-03 NOTE — Assessment & Plan Note (Signed)
Corrected with Levothyroxine 112.5mcg, last TSH 2.069 08/26/13 and 4.306 03/06/14 and 3.773 07/03/14  

## 2014-10-03 NOTE — Assessment & Plan Note (Signed)
Chronic-coccyalgia in nature, pain is controlled with Fentanyl 100mcg/hr   

## 2014-10-03 NOTE — Assessment & Plan Note (Signed)
Controlled, continue Losartan 25mg daily, Atenolol, monitor blood pressure daily. Dr. Brackbill 02/03/14 continue same cardiac meds.    

## 2014-10-03 NOTE — Assessment & Plan Note (Signed)
07/29/14 decreased Omeprazole to 20mg daily-consultant pharmacist 09/30/14 stable.    

## 2014-10-03 NOTE — Assessment & Plan Note (Signed)
09/30/14 new, started c/o since fall this am: posterior flank painful rash noted. The patient attributed to her fall from this am. She does have pain with truncal movement or deep breath.  10/03/14 09/30/14 X-ray R rib: negative for appreciable right rib fracture. Pain is better controlled with additional prn Morphine.

## 2014-10-03 NOTE — Assessment & Plan Note (Signed)
No further hypoglycemic episodes since Lantus16 units. Continue bolus Novolog 8 units bid and 12 units with lunch. Hgb A1c 7.8 03/06/14. Hgb A1c 8.1 07/03/14. Continue adjust insulin per CBGs 11/171/5 CBG am 84, 80, 50, 163 will decrease Lantus to 14units.  10/01/14 fasting blood sugar 94

## 2014-10-03 NOTE — Assessment & Plan Note (Signed)
Mild head titubation and hands tremor-not disabling. Takes Propranolol 10mg bid  

## 2014-10-03 NOTE — Progress Notes (Signed)
Patient ID: [name], female   DOB: 11-03-1926, 80 y.o.   MRN: [id number]   Code Status: DNR  Allergies  Allergen Reactions  . Penicillins Other (See Comments)    Per MAR  . Bee Venom Other (See Comments)    Per MAR  . Celebrex [Celecoxib] Other (See Comments)    Per MAR  . Lidocaine Hcl Other (See Comments)    Per MAR  . Phenobarbital Other (See Comments)    Per MAR  . Procaine Hcl Other (See Comments)    Per mar    Chief Complaint  Patient presents with  . Medical Management of Chronic Issues  . Acute Visit    UTI    HPI: Patient is a 80 y.o. female seen in the SNF at [facility] today for evaluation of UTI, right flank/lower ribs pain, s/p fall, posterior flank painful rash, and chronic medical conditions.     ED 01/29/14 for minor head injury and dementia: head CT w/o contrast: no acute findings.  Problem List Items Addressed This Visit    UTI (urinary tract infection)    04/22/14 Urine culture E Coli 7 day course of Cipro 500mg  bid.  10/01/14 Urine culture pos nitrate, large leukocyte esterase, many bacteria, wbc 21-50. Empirical ABT Nitrofurantoinin 100mg  bid x 7 days started 11/181/5.      Type 2 diabetes mellitus with diabetic neuropathy affecting both sides of body (Chronic)    No further hypoglycemic episodes since Lantus16 units. Continue bolus Novolog 8 units bid and 12 units with lunch. Hgb A1c 7.8 03/06/14. Hgb A1c 8.1 07/03/14. Continue adjust insulin per CBGs 11/171/5 CBG am 84, 80, 50, 163 will decrease Lantus to 14units.  10/01/14 fasting blood sugar 94       Right-sided chest wall pain - Primary    09/30/14 new, started c/o since fall this am: posterior flank painful rash noted. The patient attributed to her fall from this am. She does have pain with truncal movement or deep breath.  10/03/14 09/30/14 X-ray R rib: negative for appreciable right rib fracture. Pain is better controlled with additional prn Morphine.        Plantar fasciitis of left  foot    07/18/14 X-ray L foot: the findings could be consistent with inflammatory process or osteomyelitis. Neuropathic arthropathy not excluded. MyoFlex qid-observe. stable      Ischemic heart disease    angina occasionally, f/u Cardiology, continue with Imdur 60mg  and Prn NTG(last dose 02/12/13) and ASA 81mg .     Hypothyroidism    Corrected with Levothyroxine 112.28mcg, last TSH 2.069 08/26/13 and 4.306 03/06/14 and 3.773 07/03/14      HTN (hypertension)    Controlled, continue Losartan 25mg  daily, Atenolol, monitor blood pressure daily. Dr. Mare Ferrari 02/03/14 continue same cardiac meds.       Herpes zoster    09/30/14 noted a clustered painful rash posterior right flank doesn't represent traumatic bruising which the attributed to. Will empirical tx: Valacyclovir 1000mg  q12h x 10days. Adding Morphine prn for breakthrough pain.   10/03/14 less pain and skin eruption is almost resolved-not sure shingles vs abrasion.     Essential tremor    Mild head titubation and hands tremor-not disabling. Takes Propranolol 10mg  bid      Edema    Chronic venous insufficiency. Takes Furosemide 40mg  bid and Spironolctone25mg  daily       Dyspepsia    07/29/14 decreased Omeprazole to 20mg  daily-consultant pharmacist 09/30/14 stable.      Depression  Better with Cymbalta 30mg  bid and Namenda. Observe the patient.   06/24/14 dc Ativan: infrequently use(1 dose in 3 months)  10/03/14 stable.     Dementia with behavioral disturbance    Her MMSE 21/3010/04/2013. Given her hx of cardiovascular issues--vascular dementia is more likely. Tolerated  Namenda and increased Cymbalta to 30mg  bid. Mood is better controlled. Gradual decline in her cognition noted. Lack of safety awareness contributed to her frequent falling.        Constipation    Her MMSE 21/3010/04/2013. Given her hx of cardiovascular issues--vascular dementia is more likely. Tolerated  Namenda and increased Cymbalta to 30mg  bid. Mood is  better controlled. Gradual decline in her cognition noted. Lack of safety awareness contributed to her frequent falling.        Back pain    Chronic-coccyalgia in nature, pain is controlled with Fentanyl 181mcg/hr        A-fib    Rate controlled. Propranolol was increased to 20mg  bid for better rate control in setting of HTN and essential tremor. Saw Dr. Mare Ferrari 02/03/14 doing well, EKG A-fib with controlled VR. 1year office visit EKG          Review of Systems:  Review of Systems  Constitutional: Negative for fever, chills, weight loss, malaise/fatigue and diaphoresis.  HENT: Positive for hearing loss. Negative for congestion, ear discharge and sore throat.   Eyes: Negative for pain, discharge and redness.  Respiratory: Negative for cough, sputum production, shortness of breath and wheezing.   Cardiovascular: Positive for leg swelling and PND. Negative for chest pain, palpitations, orthopnea and claudication.       1+  Gastrointestinal: Negative for heartburn, nausea, vomiting, abdominal pain, diarrhea, constipation and blood in stool.       Fecal incontinence  Genitourinary: Positive for frequency. Negative for dysuria, urgency and flank pain.       Leakage.   Musculoskeletal: Positive for back pain, joint pain and falls. Negative for myalgias and neck pain.       Able to ambulate with walker. Falls 03/05/14, 05/22/14, 06/29/14. 09/30/14  Skin: Positive for rash (chronic BLE venous dermatitis. Left medial forefoot diabetic foot ulcer). Negative for itching.       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 R+L Intergluteal cleft recurrent pressure ulcers in mirror image-much smaller.  Left forefoot diabetic ulcer--healed. Excoriated urogenital/buttocks/perinum is chronic. New posterior flank clustered rash-painful and small amount drainage seen in the lower part in the skin fold(doesn't present bruise from traumatic falling-but the patient insisted it started hurting after her fall this am).    Neurological: Positive for tremors. Negative for dizziness, tingling, sensory change, speech change, focal weakness, seizures, loss of consciousness, weakness and headaches.  Endo/Heme/Allergies: Negative for environmental allergies and polydipsia. Does not bruise/bleed easily.  Psychiatric/Behavioral: Positive for depression and memory loss. Negative for hallucinations. The patient is nervous/anxious and has insomnia.      Past Medical History  Diagnosis Date  . Diabetes mellitus   . Hypertension   . Heart murmur   . Benign familial tremor   . Rosacea   . Diverticulitis   . Heart failure     diastolic heart failure  . TIA (transient ischemic attack) 2010  . Arthritis   . Breast cancer     breast -rt  . CAD (coronary artery disease)     single vessel CAD per cath in 2006; scattered nonobstructive disease in the left system, left to right collaterals to the RCA which was occluded by flush shots;  she has been managed medically   Past Surgical History  Procedure Laterality Date  . Mastectomy partial / lumpectomy  11/02/2000    Right, with SLN  . Mastectomy partial / lumpectomy  05/15/2008    Left  . Breast lumpectomy  01/19/2010    right  . Mastectomy  03/31/2010    Right   Social History:   reports that she has never smoked. She does not have any smokeless tobacco history on file. She reports that she does not drink alcohol or use illicit drugs.  Medications: Patient's Medications  New Prescriptions   No medications on file  Previous Medications   ACETAMINOPHEN (TYLENOL) 325 MG TABLET    Take 2 tablets (650 mg total) by mouth every 4 (four) hours as needed.   ALUM & MAG HYDROXIDE-SIMETH (MAALOX/MYLANTA) 200-200-20 MG/5ML SUSPENSION    Take 30 mLs by mouth every 6 (six) hours as needed for indigestion or heartburn.   ASPIRIN EC 81 MG TABLET    Take 81 mg by mouth daily.   AYR SALINE NASAL NO-DRIP GEL    Place into the nose 2 (two) times daily as needed (nose bleeds).    DOCUSATE SODIUM (COLACE) 100 MG CAPSULE    Take 200 mg by mouth at bedtime.    DORZOLAMIDE-TIMOLOL (COSOPT) 22.3-6.8 MG/ML OPHTHALMIC SOLUTION    Place 1 drop into the left eye every 12 (twelve) hours.   DULOXETINE (CYMBALTA) 30 MG CAPSULE    Take 30 mg by mouth 2 (two) times daily.   FENTANYL (DURAGESIC - DOSED MCG/HR) 100 MCG/HR    Apply one patch topically every 3 days for pain   FUROSEMIDE (LASIX) 40 MG TABLET    Take 40 mg by mouth 2 (two) times daily.    GLUCAGON (GLUCAGEN) 1 MG SOLR INJECTION    Inject 1 mg into the vein once as needed for low blood sugar.   GLUCOSAMINE-CHONDROITIN 500-400 MG TABLET    Take 1 tablet by mouth 3 (three) times daily.   GUAIFENESIN (ROBITUSSIN) 100 MG/5ML SYRUP    Take 300 mg by mouth 3 (three) times daily as needed for cough.   HYDROXYZINE (ATARAX/VISTARIL) 25 MG TABLET    Take 25 mg by mouth at bedtime as needed. For itching   INSULIN ASPART (NOVOLOG) 100 UNIT/ML INJECTION    Inject 8-12 Units into the skin 3 (three) times daily with meals. 8 units If CBG is greater than 150 at lunch and 12 units if CBG is greater than 150 at breakfast and dinner   INSULIN GLARGINE (LANTUS) 100 UNIT/ML INJECTION    Inject 16 Units into the skin at bedtime.    ISOSORBIDE MONONITRATE (IMDUR) 60 MG 24 HR TABLET    Take 60 mg by mouth every morning.    LEVOTHYROXINE (SYNTHROID, LEVOTHROID) 75 MCG TABLET    Take 75-112.5 mcg by mouth daily. Take 1 and 1/2 tablets on Saturdays then take 1 tablet all the other days   LORATADINE (CLARITIN) 10 MG TABLET    Take 10 mg by mouth daily.    LOSARTAN (COZAAR) 25 MG TABLET    Take 25 mg by mouth daily.   LUBIPROSTONE (AMITIZA) 24 MCG CAPSULE    Take 24 mcg by mouth 2 (two) times daily with a meal.     MEMANTINE HCL ER (NAMENDA XR) 28 MG CP24    Take by mouth.   METHYLCELLULOSE (ARTIFICIAL TEARS) 1 % OPHTHALMIC SOLUTION    Place 1 drop into both eyes 4 (four) times  daily as needed (for dryness).   MORPHINE (ROXANOL) 20 MG/ML CONCENTRATED  SOLUTION    Take 5 mg by mouth every 4 (four) hours as needed for severe pain.   MULTIPLE VITAMINS-MINERALS (CERTAVITE SENIOR/ANTIOXIDANT PO)    Take 1 tablet by mouth daily.   NITROGLYCERIN (NITROSTAT) 0.4 MG SL TABLET    Place 0.4 mg under the tongue every 5 (five) minutes as needed. For chest pain   NUTRITIONAL SUPPLEMENTS (RESOURCE 2.0 PO)    Take 120 mLs by mouth 3 (three) times daily.   OMEPRAZOLE (PRILOSEC) 20 MG CAPSULE    Take 20 mg by mouth 2 (two) times daily.   PROBIOTIC PRODUCT (ALIGN) 4 MG CAPS    Take 1 capsule by mouth daily.   PROPRANOLOL (INDERAL) 20 MG TABLET    Take 20 mg by mouth 2 (two) times daily.   SPIRONOLACTONE (ALDACTONE) 25 MG TABLET    Take 25 mg by mouth daily.    VITAMIN D, ERGOCALCIFEROL, (DRISDOL) 50000 UNITS CAPS    Take 50,000 Units by mouth every 30 (thirty) days. On the first of the month  Modified Medications   No medications on file  Discontinued Medications   No medications on file     Physical Exam: Physical Exam  Constitutional: She appears well-developed.  HENT:  Head: Normocephalic and atraumatic.  Left Ear: External ear normal.  Eyes: Conjunctivae and EOM are normal. Pupils are equal, round, and reactive to light.  Neck: Neck supple. No JVD present. No thyromegaly present.  Cardiovascular: Normal rate and regular rhythm.   Murmur heard. Pulmonary/Chest: Effort normal. She has rales.  Bibasilar rales.   Abdominal: Soft. There is no tenderness. There is no rebound.  Musculoskeletal: She exhibits edema and tenderness.  Back pain is chronic which is well controlled. BLE chronic edema 1+. Left mid plantar aspect foot pain-worsens with weight bearing. Left foot external deviated deformity. Pain in the right lower ribs since fall 09/30/14-worsens with truncal movements and deep breath.   Lymphadenopathy:    She has no cervical adenopathy.  Neurological: She is alert. No cranial nerve deficit. Coordination normal.  Skin: Skin is warm and dry.  Rash noted.  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 R+L Intergluteal cleft recurrent pressure ulcers in mirror image-much smaller.  Left forefoot diabetic ulcer--healed. Excoriated urogenital/buttocks/perinum is chronic. New posterior flank clustered rash-painful and small amount drainage seen in the lower part in the skin fold(doesn't present bruise from traumatic falling-but the patient insisted it started hurting after her fall this am).   Psychiatric: Her mood appears anxious. Her affect is not blunt, not labile and not inappropriate. Her speech is rapid and/or pressured (occasionally ). Her speech is not delayed and not slurred. She is hyperactive. She is not agitated, not aggressive, not slowed, not withdrawn, not actively hallucinating and not combative. Thought content is not paranoid and not delusional. She does not exhibit a depressed mood. She exhibits abnormal recent memory.  MMSE 21/30 08/2013    Filed Vitals:   10/03/14 1243  BP: 172/80  Pulse: 82  Temp: 98.8 F (37.1 C)  TempSrc: Tympanic  Resp: 20      Labs reviewed: Basic Metabolic Panel:  Recent Labs  03/06/14 04/20/14 07/03/14 10/01/14  NA 138 136* 137 136*  K 4.6 4.9 4.2 4.9  BUN 33* 30* 26* 35*  CREATININE 0.9 0.8 0.8 0.9  TSH 4.31  --  3.77  --    Liver Function Tests:  Recent Labs  03/06/14 04/20/14 10/01/14  AST 20 19 20   ALT 8 12 13   ALKPHOS 85 79 80   CBC:  Recent Labs  03/06/14 04/20/14 10/01/14  WBC 5.8 6.4 6.6  HGB 13.4 14.3 14.5  HCT 41 42 43  PLT 214 260 229    Past Procedures:  10/22/13 CXR patchy interstitial changes left lower lung most consistent with pneumonia  11/21/13 CXR minimal cardiomegaly slightly increased without pulmonary vascular congestion or pleural effusion, no inflammatory consolidate or suspicious nodule, interval resolution of interstitial pneumonitis at the left lower lung.   01/29/14 for minor head injury and dementia: head CT w/o contrast: no acute findings.   04/18/14  X-ray L knee/ankle/tibia/fibula: no acute fracture or dislocation  07/18/14 X-ray L foot: the findings could be consistent with inflammatory process or osteomyelitis. Neuropathic arthropathy not excluded.   09/30/14 X-ray R rib: negative for appreciable right rib fracture  Assessment/Plan Right-sided chest wall pain 09/30/14 new, started c/o since fall this am: posterior flank painful rash noted. The patient attributed to her fall from this am. She does have pain with truncal movement or deep breath.  10/03/14 09/30/14 X-ray R rib: negative for appreciable right rib fracture. Pain is better controlled with additional prn Morphine.      Herpes zoster 09/30/14 noted a clustered painful rash posterior right flank doesn't represent traumatic bruising which the attributed to. Will empirical tx: Valacyclovir 1000mg  q12h x 10days. Adding Morphine prn for breakthrough pain.   10/03/14 less pain and skin eruption is almost resolved-not sure shingles vs abrasion.   UTI (urinary tract infection) 04/22/14 Urine culture E Coli 7 day course of Cipro 500mg  bid.  10/01/14 Urine culture pos nitrate, large leukocyte esterase, many bacteria, wbc 21-50. Empirical ABT Nitrofurantoinin 100mg  bid x 7 days started 11/181/5.    Type 2 diabetes mellitus with diabetic neuropathy affecting both sides of body No further hypoglycemic episodes since Lantus16 units. Continue bolus Novolog 8 units bid and 12 units with lunch. Hgb A1c 7.8 03/06/14. Hgb A1c 8.1 07/03/14. Continue adjust insulin per CBGs 11/171/5 CBG am 84, 80, 50, 163 will decrease Lantus to 14units.  10/01/14 fasting blood sugar 94     Plantar fasciitis of left foot 07/18/14 X-ray L foot: the findings could be consistent with inflammatory process or osteomyelitis. Neuropathic arthropathy not excluded. MyoFlex qid-observe. stable    Ischemic heart disease angina occasionally, f/u Cardiology, continue with Imdur 60mg  and Prn NTG(last dose 02/12/13) and ASA  81mg .   Hypothyroidism Corrected with Levothyroxine 112.70mcg, last TSH 2.069 08/26/13 and 4.306 03/06/14 and 3.773 07/03/14    HTN (hypertension) Controlled, continue Losartan 25mg  daily, Atenolol, monitor blood pressure daily. Dr. Mare Ferrari 02/03/14 continue same cardiac meds.     Essential tremor Mild head titubation and hands tremor-not disabling. Takes Propranolol 10mg  bid    Edema Chronic venous insufficiency. Takes Furosemide 40mg  bid and Spironolctone25mg  daily     Dyspepsia 07/29/14 decreased Omeprazole to 20mg  daily-consultant pharmacist 09/30/14 stable.    Depression Better with Cymbalta 30mg  bid and Namenda. Observe the patient.   06/24/14 dc Ativan: infrequently use(1 dose in 3 months)  10/03/14 stable.   Dementia with behavioral disturbance Her MMSE 21/3010/04/2013. Given her hx of cardiovascular issues--vascular dementia is more likely. Tolerated  Namenda and increased Cymbalta to 30mg  bid. Mood is better controlled. Gradual decline in her cognition noted. Lack of safety awareness contributed to her frequent falling.      Constipation Her MMSE 21/3010/04/2013. Given her hx of cardiovascular issues--vascular dementia is more likely. Tolerated  Namenda and increased Cymbalta to 30mg  bid. Mood is better controlled. Gradual decline in her cognition noted. Lack of safety awareness contributed to her frequent falling.      Back pain Chronic-coccyalgia in nature, pain is controlled with Fentanyl 13mcg/hr      A-fib Rate controlled. Propranolol was increased to 20mg  bid for better rate control in setting of HTN and essential tremor. Saw Dr. Mare Ferrari 02/03/14 doing well, EKG A-fib with controlled VR. 1year office visit EKG       Family/ Staff Communication: observe the patient.   Goals of Care: SNF  Labs/tests ordered: none

## 2014-10-03 NOTE — Assessment & Plan Note (Signed)
angina occasionally, f/u Cardiology, continue with Imdur 60mg and Prn NTG(last dose 02/12/13) and ASA 81mg.    

## 2014-10-03 NOTE — Assessment & Plan Note (Signed)
Better with Cymbalta 30mg bid and Namenda. Observe the patient.   06/24/14 dc Ativan: infrequently use(1 dose in 3 months)  10/03/14 stable.   

## 2014-10-03 NOTE — Assessment & Plan Note (Signed)
09/30/14 noted a clustered painful rash posterior right flank doesn't represent traumatic bruising which the attributed to. Will empirical tx: Valacyclovir 1000mg  q12h x 10days. Adding Morphine prn for breakthrough pain.   10/03/14 less pain and skin eruption is almost resolved-not sure shingles vs abrasion.

## 2014-10-03 NOTE — Assessment & Plan Note (Signed)
07/18/14 X-ray L foot: the findings could be consistent with inflammatory process or osteomyelitis. Neuropathic arthropathy not excluded. MyoFlex qid-observe. stable  

## 2014-10-03 NOTE — Assessment & Plan Note (Signed)
Chronic venous insufficiency. Takes Furosemide 40mg bid and Spironolctone25mg daily   

## 2014-10-21 ENCOUNTER — Telehealth: Payer: Self-pay | Admitting: *Deleted

## 2014-10-21 NOTE — Telephone Encounter (Signed)
[name] with [facility] called and stated that they need a DWO Form filled out for Diabetic Shoes. Patient is a skilled resident at [facility]. [name] will fax forms to the skilled unit.

## 2014-10-31 ENCOUNTER — Non-Acute Institutional Stay (SKILLED_NURSING_FACILITY): Payer: Medicare Other | Admitting: Nurse Practitioner

## 2014-10-31 ENCOUNTER — Encounter: Payer: Self-pay | Admitting: Nurse Practitioner

## 2014-10-31 DIAGNOSIS — F0391 Unspecified dementia with behavioral disturbance: Secondary | ICD-10-CM

## 2014-10-31 DIAGNOSIS — E1142 Type 2 diabetes mellitus with diabetic polyneuropathy: Secondary | ICD-10-CM

## 2014-10-31 DIAGNOSIS — E039 Hypothyroidism, unspecified: Secondary | ICD-10-CM

## 2014-10-31 DIAGNOSIS — R1013 Epigastric pain: Secondary | ICD-10-CM

## 2014-10-31 DIAGNOSIS — F03918 Unspecified dementia, unspecified severity, with other behavioral disturbance: Secondary | ICD-10-CM

## 2014-10-31 DIAGNOSIS — M722 Plantar fascial fibromatosis: Secondary | ICD-10-CM

## 2014-10-31 DIAGNOSIS — I482 Chronic atrial fibrillation, unspecified: Secondary | ICD-10-CM

## 2014-10-31 DIAGNOSIS — R609 Edema, unspecified: Secondary | ICD-10-CM

## 2014-10-31 DIAGNOSIS — G250 Essential tremor: Secondary | ICD-10-CM

## 2014-10-31 DIAGNOSIS — M5440 Lumbago with sciatica, unspecified side: Secondary | ICD-10-CM

## 2014-10-31 DIAGNOSIS — R0789 Other chest pain: Secondary | ICD-10-CM

## 2014-10-31 DIAGNOSIS — I10 Essential (primary) hypertension: Secondary | ICD-10-CM

## 2014-10-31 DIAGNOSIS — F329 Major depressive disorder, single episode, unspecified: Secondary | ICD-10-CM

## 2014-10-31 DIAGNOSIS — K5900 Constipation, unspecified: Secondary | ICD-10-CM

## 2014-10-31 DIAGNOSIS — F32A Depression, unspecified: Secondary | ICD-10-CM

## 2014-10-31 DIAGNOSIS — I259 Chronic ischemic heart disease, unspecified: Secondary | ICD-10-CM

## 2014-10-31 NOTE — Assessment & Plan Note (Signed)
Her MMSE 21/3010/04/2013. Given her hx of cardiovascular issues--vascular dementia is more likely. Tolerated  Namenda and increased Cymbalta to 30mg  bid. Mood is better controlled. Gradual decline in her cognition noted. Lack of safety awareness contributed to her frequent falling.

## 2014-10-31 NOTE — Assessment & Plan Note (Addendum)
10/31/14 CBG am 149-228, [id number], pm 123-172-update Hgb A1cLantus to 14units. Novolog 8 u breakfast/dinner and 12 u lunch.

## 2014-10-31 NOTE — Progress Notes (Signed)
Patient ID: [name], female   DOB: Dec 08, 1925, 80 y.o.   MRN: [id number]   Code Status: DNR  Allergies  Allergen Reactions  . Penicillins Other (See Comments)    Per MAR  . Bee Venom Other (See Comments)    Per MAR  . Celebrex [Celecoxib] Other (See Comments)    Per MAR  . Lidocaine Hcl Other (See Comments)    Per MAR  . Phenobarbital Other (See Comments)    Per MAR  . Procaine Hcl Other (See Comments)    Per mar    Chief Complaint  Patient presents with  . Medical Management of Chronic Issues    HPI: Patient is a 80 y.o. female seen in the SNF at [facility] today for evaluation of chronic medical conditions.     ED 01/29/14 for minor head injury and dementia: head CT w/o contrast: no acute findings.  Problem List Items Addressed This Visit    Type 2 diabetes mellitus with diabetic neuropathy affecting both sides of body - Primary (Chronic)    10/31/14 CBG am 149-228, noon178-289, pm 123-172-update Hgb A1cLantus to 14units. Novolog 8 u breakfast/dinner and 12 u lunch.         Right-sided chest wall pain    09/30/14 new, started c/o since fall this am: posterior flank painful rash noted. The patient attributed to her fall from this am. She does have pain with truncal movement or deep breath.  10/03/14 09/30/14 X-ray R rib: negative for appreciable right rib fracture. Pain is better controlled with additional prn Morphine.  10/31/14 no further c/o pain        Plantar fasciitis of left foot    07/18/14 X-ray L foot: the findings could be consistent with inflammatory process or osteomyelitis. Neuropathic arthropathy not excluded. MyoFlex qid-observe. stable     Ischemic heart disease    angina occasionally, f/u Cardiology, continue with Imdur 60mg  and Prn NTG(last dose 02/12/13) and ASA 81mg .      Hypothyroidism    Corrected with Levothyroxine 112.101mcg, last TSH 2.069 08/26/13 and 4.306 03/06/14 and 3.773 07/03/14     HTN (hypertension)    Controlled, continue  Losartan 25mg  daily, Atenolol, monitor blood pressure daily. Dr. Mare Ferrari 02/03/14 continue same cardiac meds.       Essential tremor    Mild head titubation and hands tremor-not disabling. Takes Propranolol 10mg  bid     Edema    Chronic venous insufficiency. Takes Furosemide 40mg  bid and Spironolctone25mg  daily      Dyspepsia    07/29/14 decreased Omeprazole to 20mg  daily-consultant pharmacist 09/30/14 stable.       Depression    Better with Cymbalta 30mg  bid and Namenda. Observe the patient.   06/24/14 dc Ativan: infrequently use(1 dose in 3 months)  10/03/14 stable.      Dementia with behavioral disturbance    Her MMSE 21/3010/04/2013. Given her hx of cardiovascular issues--vascular dementia is more likely. Tolerated  Namenda and increased Cymbalta to 30mg  bid. Mood is better controlled. Gradual decline in her cognition noted. Lack of safety awareness contributed to her frequent falling.        Constipation    Stable, takes colace 100 II qhs and Linzess(off Amitiza 2mcg bid-therapeutic exchange by Pharm )and MiraLax prn.     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 Propranolol was increased to 20mg  bid for better rate control  in setting of HTN and essential tremor. Saw Dr. Mare Ferrari 02/03/14 doing well, EKG A-fib with controlled VR. 1year office visit EKG         Review of Systems:  Review of Systems  Constitutional: Negative for fever, chills, weight loss, malaise/fatigue and diaphoresis.  HENT: Positive for hearing loss. Negative for congestion, ear discharge and sore throat.   Eyes: Negative for pain, discharge and redness.  Respiratory: Negative for cough, sputum production, shortness of breath and wheezing.   Cardiovascular: Positive for leg swelling and PND. Negative for chest pain, palpitations, orthopnea and claudication.       1+  Gastrointestinal: Negative for heartburn,  nausea, vomiting, abdominal pain, diarrhea, constipation and blood in stool.       Fecal incontinence  Genitourinary: Positive for frequency. Negative for dysuria, urgency and flank pain.       Leakage.   Musculoskeletal: Positive for back pain, joint pain and falls. Negative for myalgias and neck pain.       Able to ambulate with walker. Falls 03/05/14, 05/22/14, 06/29/14. 09/30/14  Skin: Positive for rash (chronic BLE venous dermatitis. Left medial forefoot diabetic foot ulcer). Negative for itching.       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 R+L Intergluteal cleft recurrent pressure ulcers in mirror image-much smaller.  Left forefoot diabetic ulcer--healed. Excoriated urogenital/buttocks/perinum is chronic. New posterior flank clustered rash-painful and small amount drainage seen in the lower part in the skin fold(doesn't present bruise from traumatic falling-but the patient insisted it started hurting after her fall this am).   Neurological: Positive for tremors. Negative for dizziness, tingling, sensory change, speech change, focal weakness, seizures, loss of consciousness, weakness and headaches.  Endo/Heme/Allergies: Negative for environmental allergies and polydipsia. Does not bruise/bleed easily.  Psychiatric/Behavioral: Positive for depression and memory loss. Negative for hallucinations. The patient is nervous/anxious and has insomnia.      Past Medical History  Diagnosis Date  . Diabetes mellitus   . Hypertension   . Heart murmur   . Benign familial tremor   . Rosacea   . Diverticulitis   . Heart failure     diastolic heart failure  . TIA (transient ischemic attack) 2010  . Arthritis   . Breast cancer     breast -rt  . CAD (coronary artery disease)     single vessel CAD per cath in 2006; scattered nonobstructive disease in the left system, left to right collaterals to the RCA which was occluded by flush shots; she has been managed medically   Past Surgical History  Procedure  Laterality Date  . Mastectomy partial / lumpectomy  11/02/2000    Right, with SLN  . Mastectomy partial / lumpectomy  05/15/2008    Left  . Breast lumpectomy  01/19/2010    right  . Mastectomy  03/31/2010    Right   Social History:   reports that she has never smoked. She does not have any smokeless tobacco history on file. She reports that she does not drink alcohol or use illicit drugs.  Medications: Patient's Medications  New Prescriptions   No medications on file  Previous Medications   ACETAMINOPHEN (TYLENOL) 325 MG TABLET    Take 2 tablets (650 mg total) by mouth every 4 (four) hours as needed.   ALUM & MAG HYDROXIDE-SIMETH (MAALOX/MYLANTA) 200-200-20 MG/5ML SUSPENSION    Take 30 mLs by mouth every 6 (six) hours as needed for indigestion or heartburn.   ASPIRIN EC 81 MG TABLET    Take 81  mg by mouth daily.   AYR SALINE NASAL NO-DRIP GEL    Place into the nose 2 (two) times daily as needed (nose bleeds).   DOCUSATE SODIUM (COLACE) 100 MG CAPSULE    Take 200 mg by mouth at bedtime.    DORZOLAMIDE-TIMOLOL (COSOPT) 22.3-6.8 MG/ML OPHTHALMIC SOLUTION    Place 1 drop into the left eye every 12 (twelve) hours.   DULOXETINE (CYMBALTA) 30 MG CAPSULE    Take 30 mg by mouth 2 (two) times daily.   FENTANYL (DURAGESIC - DOSED MCG/HR) 100 MCG/HR    Apply one patch topically every 3 days for pain   FUROSEMIDE (LASIX) 40 MG TABLET    Take 40 mg by mouth 2 (two) times daily.    GLUCAGON (GLUCAGEN) 1 MG SOLR INJECTION    Inject 1 mg into the vein once as needed for low blood sugar.   GLUCOSAMINE-CHONDROITIN 500-400 MG TABLET    Take 1 tablet by mouth 3 (three) times daily.   GUAIFENESIN (ROBITUSSIN) 100 MG/5ML SYRUP    Take 300 mg by mouth 3 (three) times daily as needed for cough.   HYDROXYZINE (ATARAX/VISTARIL) 25 MG TABLET    Take 25 mg by mouth at bedtime as needed. For itching   INSULIN ASPART (NOVOLOG) 100 UNIT/ML INJECTION    Inject 8-12 Units into the skin 3 (three) times daily with meals. 8  units If CBG is greater than 150 at lunch and 12 units if CBG is greater than 150 at breakfast and dinner   INSULIN GLARGINE (LANTUS) 100 UNIT/ML INJECTION    Inject 16 Units into the skin at bedtime.    ISOSORBIDE MONONITRATE (IMDUR) 60 MG 24 HR TABLET    Take 60 mg by mouth every morning.    LEVOTHYROXINE (SYNTHROID, LEVOTHROID) 75 MCG TABLET    Take 75-112.5 mcg by mouth daily. Take 1 and 1/2 tablets on Saturdays then take 1 tablet all the other days   LORATADINE (CLARITIN) 10 MG TABLET    Take 10 mg by mouth daily.    LOSARTAN (COZAAR) 25 MG TABLET    Take 25 mg by mouth daily.   LUBIPROSTONE (AMITIZA) 24 MCG CAPSULE    Take 24 mcg by mouth 2 (two) times daily with a meal.     MEMANTINE HCL ER (NAMENDA XR) 28 MG CP24    Take by mouth.   METHYLCELLULOSE (ARTIFICIAL TEARS) 1 % OPHTHALMIC SOLUTION    Place 1 drop into both eyes 4 (four) times daily as needed (for dryness).   MORPHINE (ROXANOL) 20 MG/ML CONCENTRATED SOLUTION    Take 5 mg by mouth every 4 (four) hours as needed for severe pain.   MULTIPLE VITAMINS-MINERALS (CERTAVITE SENIOR/ANTIOXIDANT PO)    Take 1 tablet by mouth daily.   NITROGLYCERIN (NITROSTAT) 0.4 MG SL TABLET    Place 0.4 mg under the tongue every 5 (five) minutes as needed. For chest pain   NUTRITIONAL SUPPLEMENTS (RESOURCE 2.0 PO)    Take 120 mLs by mouth 3 (three) times daily.   OMEPRAZOLE (PRILOSEC) 20 MG CAPSULE    Take 20 mg by mouth 2 (two) times daily.   PROBIOTIC PRODUCT (ALIGN) 4 MG CAPS    Take 1 capsule by mouth daily.   PROPRANOLOL (INDERAL) 20 MG TABLET    Take 20 mg by mouth 2 (two) times daily.   SPIRONOLACTONE (ALDACTONE) 25 MG TABLET    Take 25 mg by mouth daily.    VITAMIN D, ERGOCALCIFEROL, (DRISDOL) 50000 UNITS CAPS  Take 50,000 Units by mouth every 30 (thirty) days. On the first of the month  Modified Medications   No medications on file  Discontinued Medications   No medications on file     Physical Exam: Physical Exam  Constitutional: She  appears well-developed.  HENT:  Head: Normocephalic and atraumatic.  Left Ear: External ear normal.  Eyes: Conjunctivae and EOM are normal. Pupils are equal, round, and reactive to light.  Neck: Neck supple. No JVD present. No thyromegaly present.  Cardiovascular: Normal rate and regular rhythm.   Murmur heard. Pulmonary/Chest: Effort normal. She has rales.  Bibasilar rales.   Abdominal: Soft. There is no tenderness. There is no rebound.  Musculoskeletal: She exhibits edema and tenderness.  Back pain is chronic which is well controlled. BLE chronic edema 1+. Left mid plantar aspect foot pain-worsens with weight bearing. Left foot external deviated deformity. Pain in the right lower ribs since fall 09/30/14-worsens with truncal movements and deep breath.   Lymphadenopathy:    She has no cervical adenopathy.  Neurological: She is alert. No cranial nerve deficit. Coordination normal.  Skin: Skin is warm and dry. Rash noted.  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 R+L Intergluteal cleft recurrent pressure ulcers in mirror image-much smaller.  Left forefoot diabetic ulcer--healed. Excoriated urogenital/buttocks/perinum is chronic. New posterior flank clustered rash-painful and small amount drainage seen in the lower part in the skin fold(doesn't present bruise from traumatic falling-but the patient insisted it started hurting after her fall this am).   Psychiatric: Her mood appears anxious. Her affect is not blunt, not labile and not inappropriate. Her speech is rapid and/or pressured (occasionally ). Her speech is not delayed and not slurred. She is hyperactive. She is not agitated, not aggressive, not slowed, not withdrawn, not actively hallucinating and not combative. Thought content is not paranoid and not delusional. She does not exhibit a depressed mood. She exhibits abnormal recent memory.  MMSE 21/30 08/2013    Filed Vitals:   10/31/14 1101  BP: 108/70  Pulse: 68  Temp: 98.2 F (36.8 C)    TempSrc: Tympanic  Resp: 18      Labs reviewed: Basic Metabolic Panel:  Recent Labs  03/06/14 04/20/14 07/03/14 10/01/14  NA 138 136* 137 136*  K 4.6 4.9 4.2 4.9  BUN 33* 30* 26* 35*  CREATININE 0.9 0.8 0.8 0.9  TSH 4.31  --  3.77  --    Liver Function Tests:  Recent Labs  03/06/14 04/20/14 10/01/14  AST 20 19 20   ALT 8 12 13   ALKPHOS 85 79 80   CBC:  Recent Labs  03/06/14 04/20/14 10/01/14  WBC 5.8 6.4 6.6  HGB 13.4 14.3 14.5  HCT 41 42 43  PLT 214 260 229    Past Procedures:  10/22/13 CXR patchy interstitial changes left lower lung most consistent with pneumonia  11/21/13 CXR minimal cardiomegaly slightly increased without pulmonary vascular congestion or pleural effusion, no inflammatory consolidate or suspicious nodule, interval resolution of interstitial pneumonitis at the left lower lung.   01/29/14 for minor head injury and dementia: head CT w/o contrast: no acute findings.   04/18/14 X-ray L knee/ankle/tibia/fibula: no acute fracture or dislocation  07/18/14 X-ray L foot: the findings could be consistent with inflammatory process or osteomyelitis. Neuropathic arthropathy not excluded.   09/30/14 X-ray R rib: negative for appreciable right rib fracture  Assessment/Plan Type 2 diabetes mellitus with diabetic neuropathy affecting both sides of body 10/31/14 CBG am 149-228, noon178-289, pm 123-172-update Hgb A1cLantus to  14units. Novolog 8 u breakfast/dinner and 12 u lunch.       A-fib Rate controlled. Propranolol was increased to 20mg  bid for better rate control in setting of HTN and essential tremor. Saw Dr. Mare Ferrari 02/03/14 doing well, EKG A-fib with controlled VR. 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   Dyspepsia 07/29/14 decreased Omeprazole to  20mg  daily-consultant pharmacist 09/30/14 stable.     Dementia with behavioral disturbance Her MMSE 21/3010/04/2013. Given her hx of cardiovascular issues--vascular dementia is more likely. Tolerated  Namenda and increased Cymbalta to 30mg  bid. Mood is better controlled. Gradual decline in her cognition noted. Lack of safety awareness contributed to her frequent falling.      Depression Better with Cymbalta 30mg  bid and Namenda. Observe the patient.   06/24/14 dc Ativan: infrequently use(1 dose in 3 months)  10/03/14 stable.    Edema Chronic venous insufficiency. Takes Furosemide 40mg  bid and Spironolctone25mg  daily    Essential tremor Mild head titubation and hands tremor-not disabling. Takes Propranolol 10mg  bid   HTN (hypertension) Controlled, continue Losartan 25mg  daily, Atenolol, monitor blood pressure daily. Dr. Mare Ferrari 02/03/14 continue same cardiac meds.     Hypothyroidism Corrected with Levothyroxine 112.32mcg, last TSH 2.069 08/26/13 and 4.306 03/06/14 and 3.773 07/03/14   Ischemic heart disease angina occasionally, f/u Cardiology, continue with Imdur 60mg  and Prn NTG(last dose 02/12/13) and ASA 81mg .    Plantar fasciitis of left foot 07/18/14 X-ray L foot: the findings could be consistent with inflammatory process or osteomyelitis. Neuropathic arthropathy not excluded. MyoFlex qid-observe. stable   Right-sided chest wall pain 09/30/14 new, started c/o since fall this am: posterior flank painful rash noted. The patient attributed to her fall from this am. She does have pain with truncal movement or deep breath.  10/03/14 09/30/14 X-ray R rib: negative for appreciable right rib fracture. Pain is better controlled with additional prn Morphine.  10/31/14 no further c/o pain        Family/ Staff Communication: observe the patient.   Goals of Care: SNF  Labs/tests ordered: none

## 2014-10-31 NOTE — Assessment & Plan Note (Signed)
07/29/14 decreased Omeprazole to 20mg  daily-consultant pharmacist 09/30/14 stable.

## 2014-10-31 NOTE — Assessment & Plan Note (Signed)
09/30/14 new, started c/o since fall this am: posterior flank painful rash noted. The patient attributed to her fall from this am. She does have pain with truncal movement or deep breath.  10/03/14 09/30/14 X-ray R rib: negative for appreciable right rib fracture. Pain is better controlled with additional prn Morphine.  10/31/14 no further c/o pain

## 2014-10-31 NOTE — Assessment & Plan Note (Signed)
Mild head titubation and hands tremor-not disabling. Takes Propranolol 10mg  bid

## 2014-10-31 NOTE — Assessment & Plan Note (Signed)
07/18/14 X-ray L foot: the findings could be consistent with inflammatory process or osteomyelitis. Neuropathic arthropathy not excluded. MyoFlex qid-observe. stable

## 2014-10-31 NOTE — Assessment & Plan Note (Signed)
angina occasionally, f/u Cardiology, continue with Imdur 60mg  and Prn NTG(last dose 02/12/13) and ASA 81mg .

## 2014-10-31 NOTE — Assessment & Plan Note (Signed)
Rate controlled. Propranolol was increased to 20mg  bid for better rate control in setting of HTN and essential tremor. Saw Dr. Mare Ferrari 02/03/14 doing well, EKG A-fib with controlled VR. 1year office visit EKG

## 2014-10-31 NOTE — Assessment & Plan Note (Signed)
Better with Cymbalta 30mg  bid and Namenda. Observe the patient.   06/24/14 dc Ativan: infrequently use(1 dose in 3 months)  10/03/14 stable.

## 2014-10-31 NOTE — Assessment & Plan Note (Signed)
Chronic-coccyalgia in nature, pain is controlled with Fentanyl 189mcg/hr

## 2014-10-31 NOTE — Assessment & Plan Note (Signed)
Controlled, continue Losartan 25mg  daily, Atenolol, monitor blood pressure daily. Dr. Mare Ferrari 02/03/14 continue same cardiac meds.

## 2014-10-31 NOTE — Assessment & Plan Note (Signed)
Chronic venous insufficiency. Takes Furosemide 40mg  bid and Spironolctone25mg  daily

## 2014-10-31 NOTE — Assessment & Plan Note (Signed)
Stable, takes colace 100 II qhs and Linzess(off Amitiza 73mcg bid-therapeutic exchange by Pharm )and MiraLax prn.

## 2014-10-31 NOTE — Assessment & Plan Note (Signed)
Corrected with Levothyroxine 112.5mcg, last TSH 2.069 08/26/13 and 4.306 03/06/14 and 3.773 07/03/14  

## 2014-11-03 LAB — HEMOGLOBIN A1C: Hgb A1c MFr Bld: 8.3 % — AB (ref 4.0–6.0)

## 2014-11-04 ENCOUNTER — Non-Acute Institutional Stay (SKILLED_NURSING_FACILITY): Payer: Medicare Other | Admitting: Internal Medicine

## 2014-11-04 ENCOUNTER — Other Ambulatory Visit: Payer: Self-pay | Admitting: Nurse Practitioner

## 2014-11-04 ENCOUNTER — Encounter: Payer: Self-pay | Admitting: Internal Medicine

## 2014-11-04 DIAGNOSIS — E1142 Type 2 diabetes mellitus with diabetic polyneuropathy: Secondary | ICD-10-CM

## 2014-11-04 DIAGNOSIS — E1151 Type 2 diabetes mellitus with diabetic peripheral angiopathy without gangrene: Secondary | ICD-10-CM

## 2014-11-04 DIAGNOSIS — I798 Other disorders of arteries, arterioles and capillaries in diseases classified elsewhere: Secondary | ICD-10-CM

## 2014-11-04 NOTE — Progress Notes (Signed)
Patient ID: [name], female   DOB: 03-Feb-1926, 80 y.o.   MRN: [id number]    [place] Room Number: 2  Place of Service: [facility] (31)    Allergies  Allergen Reactions  . Penicillins Other (See Comments)    Per MAR  . Bee Venom Other (See Comments)    Per MAR  . Celebrex [Celecoxib] Other (See Comments)    Per MAR  . Lidocaine Hcl Other (See Comments)    Per MAR  . Phenobarbital Other (See Comments)    Per MAR  . Procaine Hcl Other (See Comments)    Per mar    Chief Complaint  Patient presents with  . Medical Management of Chronic Issues    HPI:  Received qualifying form for diabetic and inserts.  Type 2 diabetes mellitus with diabetic neuropathy affecting both sides of body: Unchanged. Significant peripheral neuropathy. History of prior foot ulcerations. Foot deformities with collapsed arches and flat feet bilaterally.  Controlled type 2 DM with peripheral circulatory disorder: Diminished dorsalis pedis and posterior tibial arteries bilaterally    Medications: Patient's Medications  New Prescriptions   No medications on file  Previous Medications   ACETAMINOPHEN (TYLENOL) 325 MG TABLET    Take 2 tablets (650 mg total) by mouth every 4 (four) hours as needed.   ALUM & MAG HYDROXIDE-SIMETH (MAALOX/MYLANTA) 200-200-20 MG/5ML SUSPENSION    Take 30 mLs by mouth every 6 (six) hours as needed for indigestion or heartburn.   ASPIRIN EC 81 MG TABLET    Take 81 mg by mouth daily.   AYR SALINE NASAL NO-DRIP GEL    Place into the nose 2 (two) times daily as needed (nose bleeds).   DOCUSATE SODIUM (COLACE) 100 MG CAPSULE    Take 200 mg by mouth at bedtime.    DORZOLAMIDE-TIMOLOL (COSOPT) 22.3-6.8 MG/ML OPHTHALMIC SOLUTION    Place 1 drop into the left eye every 12 (twelve) hours.   DULOXETINE (CYMBALTA) 30 MG CAPSULE    Take 30 mg by mouth 2 (two) times daily.   FENTANYL (DURAGESIC - DOSED MCG/HR) 100 MCG/HR    Apply one patch topically every 3 days for  pain   FUROSEMIDE (LASIX) 40 MG TABLET    Take 40 mg by mouth 2 (two) times daily.    GLUCAGON (GLUCAGEN) 1 MG SOLR INJECTION    Inject 1 mg into the vein once as needed for low blood sugar.   GLUCOSAMINE-CHONDROITIN 500-400 MG TABLET    Take 1 tablet by mouth 3 (three) times daily.   GUAIFENESIN (ROBITUSSIN) 100 MG/5ML SYRUP    Take 300 mg by mouth 3 (three) times daily as needed for cough.   HYDROXYZINE (ATARAX/VISTARIL) 25 MG TABLET    Take 25 mg by mouth at bedtime as needed. For itching   INSULIN ASPART (NOVOLOG) 100 UNIT/ML INJECTION    Inject 8-12 Units into the skin 3 (three) times daily with meals. 8 units If CBG is greater than 150 at lunch and 12 units if CBG is greater than 150 at breakfast and dinner   INSULIN GLARGINE (LANTUS) 100 UNIT/ML INJECTION    Inject 16 Units into the skin at bedtime.    ISOSORBIDE MONONITRATE (IMDUR) 60 MG 24 HR TABLET    Take 60 mg by mouth every morning.    LEVOTHYROXINE (SYNTHROID, LEVOTHROID) 75 MCG TABLET    Take 75-112.5 mcg by mouth daily. Take 1 and 1/2 tablets on Saturdays then take 1 tablet all the other  days   LORATADINE (CLARITIN) 10 MG TABLET    Take 10 mg by mouth daily.    LOSARTAN (COZAAR) 25 MG TABLET    Take 25 mg by mouth daily.   LUBIPROSTONE (AMITIZA) 24 MCG CAPSULE    Take 24 mcg by mouth 2 (two) times daily with a meal.     MEMANTINE HCL ER (NAMENDA XR) 28 MG CP24    Take by mouth.   METHYLCELLULOSE (ARTIFICIAL TEARS) 1 % OPHTHALMIC SOLUTION    Place 1 drop into both eyes 4 (four) times daily as needed (for dryness).   MORPHINE (ROXANOL) 20 MG/ML CONCENTRATED SOLUTION    Take 5 mg by mouth every 4 (four) hours as needed for severe pain.   MULTIPLE VITAMINS-MINERALS (CERTAVITE SENIOR/ANTIOXIDANT PO)    Take 1 tablet by mouth daily.   NITROGLYCERIN (NITROSTAT) 0.4 MG SL TABLET    Place 0.4 mg under the tongue every 5 (five) minutes as needed. For chest pain   NUTRITIONAL SUPPLEMENTS (RESOURCE 2.0 PO)    Take 120 mLs by mouth 3 (three)  times daily.   OMEPRAZOLE (PRILOSEC) 20 MG CAPSULE    Take 20 mg by mouth 2 (two) times daily.   PROBIOTIC PRODUCT (ALIGN) 4 MG CAPS    Take 1 capsule by mouth daily.   PROPRANOLOL (INDERAL) 20 MG TABLET    Take 20 mg by mouth 2 (two) times daily.   SPIRONOLACTONE (ALDACTONE) 25 MG TABLET    Take 25 mg by mouth daily.    VITAMIN D, ERGOCALCIFEROL, (DRISDOL) 50000 UNITS CAPS    Take 50,000 Units by mouth every 30 (thirty) days. On the first of the month  Modified Medications   No medications on file  Discontinued Medications   No medications on file     Review of Systems  Constitutional: Negative for fever, chills, diaphoresis, activity change, appetite change, fatigue and unexpected weight change.       Overweight. Sedentary.  HENT: Negative for congestion, ear discharge, ear pain, hearing loss, postnasal drip, rhinorrhea, sore throat, tinnitus, trouble swallowing and voice change.   Eyes: Negative for pain, redness, itching and visual disturbance.       Corrective lenses.  Respiratory: Negative for cough, choking, shortness of breath and wheezing.   Cardiovascular: Negative for chest pain, palpitations and leg swelling.  Gastrointestinal: Negative for nausea, abdominal pain, diarrhea, constipation and abdominal distention.  Endocrine: Negative for cold intolerance, heat intolerance, polydipsia, polyphagia and polyuria.       Diabetic with circulatory and neurologic complications.  Genitourinary: Negative for dysuria, urgency, frequency, hematuria, flank pain, vaginal discharge, difficulty urinating and pelvic pain.  Musculoskeletal: Negative for myalgias, back pain, arthralgias, gait problem, neck pain and neck stiffness.  Skin: Negative for color change, pallor and rash.       Dry skin. History of chronic venous stasis changes of the lower legs bilaterally. Venous insufficiency.  Allergic/Immunologic: Negative.   Neurological: Negative for dizziness, tremors, seizures, syncope,  weakness, numbness and headaches.       Long-standing history of peripheral neuropathy.  Hematological: Negative for adenopathy. Does not bruise/bleed easily.  Psychiatric/Behavioral: Negative for suicidal ideas, hallucinations, behavioral problems, confusion, sleep disturbance, dysphoric mood and agitation. The patient is not nervous/anxious and is not hyperactive.     Filed Vitals:   11/04/14 1342  BP: 130/70  Pulse: 70  Temp: 98 F (36.7 C)  Resp: 16   There is no weight on file to calculate BMI.  Physical Exam  Constitutional: She  appears well-developed.  HENT:  Head: Normocephalic and atraumatic.  Left Ear: External ear normal.  Eyes: Conjunctivae and EOM are normal. Pupils are equal, round, and reactive to light.  Neck: Neck supple. No JVD present. No thyromegaly present.  Cardiovascular: Normal rate and regular rhythm.   Murmur heard. Pulmonary/Chest: Effort normal. She has rales.  Bibasilar rales.   Abdominal: Soft. There is no tenderness. There is no rebound.  Musculoskeletal: She exhibits edema and tenderness.  Back pain is chronic which is well controlled. BLE chronic edema 1+. Left mid plantar aspect foot pain-worsens with weight bearing. Left foot external deviated deformity. Pain in the right lower ribs since fall 09/30/14-worsens with truncal movements and deep breath.   Lymphadenopathy:    She has no cervical adenopathy.  Neurological: She is alert. No cranial nerve deficit. Coordination normal.  Insensitive to vibratory testing and monofilament testing  Skin: Skin is warm and dry. Rash noted.  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 R+L Intergluteal cleft recurrent pressure ulcers in mirror image-much smaller.  Left forefoot diabetic ulcer--healed. Excoriated urogenital/buttocks/perinum is chronic. New posterior flank clustered rash-painful and small amount drainage seen in the lower part in the skin fold(doesn't present bruise from traumatic falling-but the patient  insisted it started hurting after her fall this am).   Psychiatric: Her mood appears anxious. Her affect is not blunt, not labile and not inappropriate. Her speech is rapid and/or pressured (occasionally ). Her speech is not delayed and not slurred. She is hyperactive. She is not agitated, not aggressive, not slowed, not withdrawn, not actively hallucinating and not combative. Thought content is not paranoid and not delusional. She does not exhibit a depressed mood. She exhibits abnormal recent memory.  MMSE 21/30 08/2013     Labs reviewed: Lab on 11/04/2014  Component Date Value Ref Range Status  . Hgb A1c MFr Bld 11/03/2014 8.3* 4.0 - 6.0 % Final  Nursing Home on 10/03/2014  Component Date Value Ref Range Status  . Hemoglobin 10/01/2014 14.5  12.0 - 16.0 g/dL Final  . HCT 10/01/2014 43  36 - 46 % Final  . Platelets 10/01/2014 229  150 - 399 K/L Final  . WBC 10/01/2014 6.6   Final  . Glucose 10/01/2014 94   Final  . BUN 10/01/2014 35* 4 - 21 mg/dL Final  . Creatinine 10/01/2014 0.9  0.5 - 1.1 mg/dL Final  . Potassium 10/01/2014 4.9  3.4 - 5.3 mmol/L Final  . Sodium 10/01/2014 136* 137 - 147 mmol/L Final  . Alkaline Phosphatase 10/01/2014 80  25 - 125 U/L Final  . ALT 10/01/2014 13  7 - 35 U/L Final  . AST 10/01/2014 20  13 - 35 U/L Final  . Bilirubin, Total 10/01/2014 0.5   Final     Assessment/Plan  Type 2 diabetes mellitus with diabetic neuropathy affecting both sides of body  Controlled type 2 DM with peripheral circulatory disorder  Patient needs diabetic shoe inserts to protect her feet which have deformities, neuropathy, and circulatory deficiencies. She has had previous foot ulcerations and is at high risk for recurrence.

## 2014-11-18 ENCOUNTER — Encounter: Payer: Self-pay | Admitting: Nurse Practitioner

## 2014-11-18 ENCOUNTER — Non-Acute Institutional Stay (SKILLED_NURSING_FACILITY): Payer: Medicare Other | Admitting: Nurse Practitioner

## 2014-11-18 DIAGNOSIS — F329 Major depressive disorder, single episode, unspecified: Secondary | ICD-10-CM

## 2014-11-18 DIAGNOSIS — F32A Depression, unspecified: Secondary | ICD-10-CM

## 2014-11-18 DIAGNOSIS — E039 Hypothyroidism, unspecified: Secondary | ICD-10-CM

## 2014-11-18 DIAGNOSIS — E1142 Type 2 diabetes mellitus with diabetic polyneuropathy: Secondary | ICD-10-CM

## 2014-11-18 DIAGNOSIS — I482 Chronic atrial fibrillation, unspecified: Secondary | ICD-10-CM

## 2014-11-18 DIAGNOSIS — I259 Chronic ischemic heart disease, unspecified: Secondary | ICD-10-CM

## 2014-11-18 DIAGNOSIS — F03918 Unspecified dementia, unspecified severity, with other behavioral disturbance: Secondary | ICD-10-CM

## 2014-11-18 DIAGNOSIS — I798 Other disorders of arteries, arterioles and capillaries in diseases classified elsewhere: Secondary | ICD-10-CM

## 2014-11-18 DIAGNOSIS — L8992 Pressure ulcer of unspecified site, stage 2: Secondary | ICD-10-CM

## 2014-11-18 DIAGNOSIS — G250 Essential tremor: Secondary | ICD-10-CM

## 2014-11-18 DIAGNOSIS — R1013 Epigastric pain: Secondary | ICD-10-CM

## 2014-11-18 DIAGNOSIS — R609 Edema, unspecified: Secondary | ICD-10-CM

## 2014-11-18 DIAGNOSIS — F0391 Unspecified dementia with behavioral disturbance: Secondary | ICD-10-CM

## 2014-11-18 DIAGNOSIS — I10 Essential (primary) hypertension: Secondary | ICD-10-CM

## 2014-11-18 DIAGNOSIS — K5900 Constipation, unspecified: Secondary | ICD-10-CM

## 2014-11-18 DIAGNOSIS — E1151 Type 2 diabetes mellitus with diabetic peripheral angiopathy without gangrene: Secondary | ICD-10-CM

## 2014-11-18 DIAGNOSIS — M5440 Lumbago with sciatica, unspecified side: Secondary | ICD-10-CM

## 2014-11-18 NOTE — Assessment & Plan Note (Signed)
R+L buttocks-granulated wound bed with occasional bleeding-will apply Silvadene cream daily and prn until healed.

## 2014-11-18 NOTE — Assessment & Plan Note (Signed)
Mild head titubation and hands tremor-not disabling. Takes Propranolol 10mg  bid

## 2014-11-18 NOTE — Assessment & Plan Note (Signed)
10/31/14 CBG am 149-228, [id number], pm 123-172-update Hgb A1cLantus to 14units. Novolog 8 u breakfast/dinner and 12 u lunch.

## 2014-11-18 NOTE — Assessment & Plan Note (Signed)
07/29/14 decreased Omeprazole to 20mg  daily-consultant pharmacist 09/30/14 stable.  11/18/14 stable.

## 2014-11-18 NOTE — Assessment & Plan Note (Signed)
angina occasionally, f/u Cardiology, continue with Imdur 60mg  and Prn NTG(last dose 02/12/13) and ASA 81mg .

## 2014-11-18 NOTE — Assessment & Plan Note (Signed)
Better with Cymbalta 30mg  bid and Namenda. Observe the patient.   06/24/14 dc Ativan: infrequently use(1 dose in 3 months)  10/03/14 stable.   11/18/14 mood is stable.

## 2014-11-18 NOTE — Assessment & Plan Note (Signed)
Controlled, continue Losartan 25mg  daily, Atenolol, monitor blood pressure daily. Dr. Mare Ferrari 02/03/14 continue same cardiac meds.

## 2014-11-18 NOTE — Assessment & Plan Note (Signed)
10/31/14 CBG am 149-228, [id number], pm 123-172-update Hgb A1cLantus to 14units. Novolog 8 u breakfast/dinner and 12 u lunch.

## 2014-11-18 NOTE — Assessment & Plan Note (Signed)
Rate controlled. Propranolol was increased to 20mg  bid for better rate control in setting of HTN and essential tremor. Saw Dr. Mare Ferrari 02/03/14 doing well, EKG A-fib with controlled VR. 1year office visit EKG

## 2014-11-18 NOTE — Assessment & Plan Note (Signed)
Chronic venous insufficiency. Takes Furosemide 40mg  bid and Spironolctone25mg  daily. Trace edema BLE

## 2014-11-18 NOTE — Assessment & Plan Note (Signed)
Stable, takes colace 100 II qhs and Linzess(off Amitiza 35mcg bid-therapeutic exchange by Pharm )and MiraLax prn.

## 2014-11-18 NOTE — Assessment & Plan Note (Signed)
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

## 2014-11-18 NOTE — Assessment & Plan Note (Signed)
Corrected with Levothyroxine 112.62mcg, last TSH 2.069 08/26/13 and 4.306 03/06/14 and 3.773 07/03/14

## 2014-11-18 NOTE — Assessment & Plan Note (Signed)
Her MMSE 21/3010/04/2013. Given her hx of cardiovascular issues--vascular dementia is more likely. Tolerated  Namenda and increased Cymbalta to 30mg  bid. Mood is better controlled. Gradual decline in her cognition noted. Lack of safety awareness contributed to her frequent falling.

## 2014-11-18 NOTE — Progress Notes (Signed)
Patient ID: [name], female   DOB: [birth year], 81 y.o.   MRN: [id number]   Code Status: DNR  Allergies  Allergen Reactions  . Penicillins Other (See Comments)    Per MAR  . Bee Venom Other (See Comments)    Per MAR  . Celebrex [Celecoxib] Other (See Comments)    Per MAR  . Lidocaine Hcl Other (See Comments)    Per MAR  . Phenobarbital Other (See Comments)    Per MAR  . Procaine Hcl Other (See Comments)    Per mar    Chief Complaint  Patient presents with  . Medical Management of Chronic Issues  . Acute Visit    buttocks pressure ulcers.     HPI: Patient is a 81 y.o. female seen in the SNF at [facility] today for evaluation of buttock pressure ulcers and chronic medical conditions.     ED 01/29/14 for minor head injury and dementia: head CT w/o contrast: no acute findings.  Problem List Items Addressed This Visit    Type 2 diabetes mellitus with diabetic neuropathy affecting both sides of body (Chronic)    10/31/14 CBG am 149-228, noon178-289, pm 123-172-update Hgb A1cLantus to 14units. Novolog 8 u breakfast/dinner and 12 u lunch.        Pressure ulcer stage II    R+L buttocks-granulated wound bed with occasional bleeding-will apply Silvadene cream daily and prn until healed.     Ischemic heart disease    angina occasionally, f/u Cardiology, continue with Imdur 60mg  and Prn NTG(last dose 02/12/13) and ASA 81mg .       Hypothyroidism    Corrected with Levothyroxine 112.4mcg, last TSH 2.069 08/26/13 and 4.306 03/06/14 and 3.773 07/03/14      HTN (hypertension)    Controlled, continue Losartan 25mg  daily, Atenolol, monitor blood pressure daily. Dr. Mare Ferrari 02/03/14 continue same cardiac meds.       Essential tremor    Mild head titubation and hands tremor-not disabling. Takes Propranolol 10mg  bid     Edema    Chronic venous insufficiency. Takes Furosemide 40mg  bid and Spironolctone25mg  daily. Trace edema BLE       Dyspepsia    07/29/14 decreased  Omeprazole to 20mg  daily-consultant pharmacist 09/30/14 stable.  11/18/14 stable.      Depression    Better with Cymbalta 30mg  bid and Namenda. Observe the patient.   06/24/14 dc Ativan: infrequently use(1 dose in 3 months)  10/03/14 stable.   11/18/14 mood is stable.     Dementia with behavioral disturbance    Her MMSE 21/3010/04/2013. Given her hx of cardiovascular issues--vascular dementia is more likely. Tolerated  Namenda and increased Cymbalta to 30mg  bid. Mood is better controlled. Gradual decline in her cognition noted. Lack of safety awareness contributed to her frequent falling.       Controlled type 2 DM with peripheral circulatory disorder (Chronic)    10/31/14 CBG am 149-228, [id number], pm 123-172-update Hgb A1cLantus to 14units. Novolog 8 u breakfast/dinner and 12 u lunch.      Constipation    Stable, takes colace 100 II qhs and Linzess(off Amitiza 35mcg bid-therapeutic exchange by Pharm )and MiraLax prn.      Back pain    Chronic-coccyalgia in nature, pain is controlled with Fentanyl 191mcg/hr       A-fib - Primary    Rate controlled. Propranolol was increased to 20mg  bid for better rate control in setting of HTN and essential tremor. Saw Dr. Mare Ferrari 02/03/14 doing well, EKG A-fib with  controlled VR. 1year office visit EKG         Review of Systems:  Review of Systems  Constitutional: Negative for fever, chills, weight loss, malaise/fatigue and diaphoresis.  HENT: Positive for hearing loss. Negative for congestion, ear discharge and sore throat.   Eyes: Negative for pain, discharge and redness.  Respiratory: Negative for cough, sputum production, shortness of breath and wheezing.   Cardiovascular: Positive for leg swelling and PND. Negative for chest pain, palpitations, orthopnea and claudication.       Trace only  Gastrointestinal: Negative for heartburn, nausea, vomiting, abdominal pain, diarrhea, constipation and blood in stool.       Fecal  incontinence  Genitourinary: Positive for frequency. Negative for dysuria, urgency and flank pain.       Leakage.   Musculoskeletal: Positive for back pain, joint pain and falls. Negative for myalgias and neck pain.       Able to ambulate with walker. Falls 03/05/14, 05/22/14, 06/29/14. 09/30/14  Skin: Positive for rash (chronic BLE venous dermatitis. Left medial forefoot diabetic foot ulcer). Negative for itching.       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 R+L Intergluteal cleft recurrent pressure ulcers in mirror image-much smaller.  Left forefoot diabetic ulcer--healed. R+L buttocks excoriated   Neurological: Positive for tremors. Negative for dizziness, tingling, sensory change, speech change, focal weakness, seizures, loss of consciousness, weakness and headaches.  Endo/Heme/Allergies: Negative for environmental allergies and polydipsia. Does not bruise/bleed easily.  Psychiatric/Behavioral: Positive for depression and memory loss. Negative for hallucinations. The patient is nervous/anxious and has insomnia.      Past Medical History  Diagnosis Date  . Diabetes mellitus   . Hypertension   . Heart murmur   . Benign familial tremor   . Rosacea   . Diverticulitis   . Heart failure     diastolic heart failure  . TIA (transient ischemic attack) 2010  . Arthritis   . Breast cancer     breast -rt  . CAD (coronary artery disease)     single vessel CAD per cath in 2006; scattered nonobstructive disease in the left system, left to right collaterals to the RCA which was occluded by flush shots; she has been managed medically   Past Surgical History  Procedure Laterality Date  . Mastectomy partial / lumpectomy  11/02/2000    Right, with SLN  . Mastectomy partial / lumpectomy  05/15/2008    Left  . Breast lumpectomy  01/19/2010    right  . Mastectomy  03/31/2010    Right   Social History:   reports that she has never smoked. She does not have any smokeless tobacco history on file. She reports  that she does not drink alcohol or use illicit drugs.  Medications: Patient's Medications  New Prescriptions   No medications on file  Previous Medications   ACETAMINOPHEN (TYLENOL) 325 MG TABLET    Take 2 tablets (650 mg total) by mouth every 4 (four) hours as needed.   ALUM & MAG HYDROXIDE-SIMETH (MAALOX/MYLANTA) 200-200-20 MG/5ML SUSPENSION    Take 30 mLs by mouth every 6 (six) hours as needed for indigestion or heartburn.   ASPIRIN EC 81 MG TABLET    Take 81 mg by mouth daily.   AYR SALINE NASAL NO-DRIP GEL    Place into the nose 2 (two) times daily as needed (nose bleeds).   DOCUSATE SODIUM (COLACE) 100 MG CAPSULE    Take 200 mg by mouth at bedtime.    DORZOLAMIDE-TIMOLOL (COSOPT) 22.3-6.8  MG/ML OPHTHALMIC SOLUTION    Place 1 drop into the left eye every 12 (twelve) hours.   DULOXETINE (CYMBALTA) 30 MG CAPSULE    Take 30 mg by mouth 2 (two) times daily.   FENTANYL (DURAGESIC - DOSED MCG/HR) 100 MCG/HR    Apply one patch topically every 3 days for pain   FUROSEMIDE (LASIX) 40 MG TABLET    Take 40 mg by mouth 2 (two) times daily.    GLUCAGON (GLUCAGEN) 1 MG SOLR INJECTION    Inject 1 mg into the vein once as needed for low blood sugar.   GLUCOSAMINE-CHONDROITIN 500-400 MG TABLET    Take 1 tablet by mouth 3 (three) times daily.   GUAIFENESIN (ROBITUSSIN) 100 MG/5ML SYRUP    Take 300 mg by mouth 3 (three) times daily as needed for cough.   HYDROXYZINE (ATARAX/VISTARIL) 25 MG TABLET    Take 25 mg by mouth at bedtime as needed. For itching   INSULIN ASPART (NOVOLOG) 100 UNIT/ML INJECTION    Inject 8-12 Units into the skin 3 (three) times daily with meals. 8 units If CBG is greater than 150 at lunch and 12 units if CBG is greater than 150 at breakfast and dinner   INSULIN GLARGINE (LANTUS) 100 UNIT/ML INJECTION    Inject 16 Units into the skin at bedtime.    ISOSORBIDE MONONITRATE (IMDUR) 60 MG 24 HR TABLET    Take 60 mg by mouth every morning.    LEVOTHYROXINE (SYNTHROID, LEVOTHROID) 75 MCG  TABLET    Take 75-112.5 mcg by mouth daily. Take 1 and 1/2 tablets on Saturdays then take 1 tablet all the other days   LORATADINE (CLARITIN) 10 MG TABLET    Take 10 mg by mouth daily.    LOSARTAN (COZAAR) 25 MG TABLET    Take 25 mg by mouth daily.   LUBIPROSTONE (AMITIZA) 24 MCG CAPSULE    Take 24 mcg by mouth 2 (two) times daily with a meal.     MEMANTINE HCL ER (NAMENDA XR) 28 MG CP24    Take by mouth.   METHYLCELLULOSE (ARTIFICIAL TEARS) 1 % OPHTHALMIC SOLUTION    Place 1 drop into both eyes 4 (four) times daily as needed (for dryness).   MORPHINE (ROXANOL) 20 MG/ML CONCENTRATED SOLUTION    Take 5 mg by mouth every 4 (four) hours as needed for severe pain.   MULTIPLE VITAMINS-MINERALS (CERTAVITE SENIOR/ANTIOXIDANT PO)    Take 1 tablet by mouth daily.   NITROGLYCERIN (NITROSTAT) 0.4 MG SL TABLET    Place 0.4 mg under the tongue every 5 (five) minutes as needed. For chest pain   NUTRITIONAL SUPPLEMENTS (RESOURCE 2.0 PO)    Take 120 mLs by mouth 3 (three) times daily.   OMEPRAZOLE (PRILOSEC) 20 MG CAPSULE    Take 20 mg by mouth 2 (two) times daily.   PROBIOTIC PRODUCT (ALIGN) 4 MG CAPS    Take 1 capsule by mouth daily.   PROPRANOLOL (INDERAL) 20 MG TABLET    Take 20 mg by mouth 2 (two) times daily.   SPIRONOLACTONE (ALDACTONE) 25 MG TABLET    Take 25 mg by mouth daily.    VITAMIN D, ERGOCALCIFEROL, (DRISDOL) 50000 UNITS CAPS    Take 50,000 Units by mouth every 30 (thirty) days. On the first of the month  Modified Medications   No medications on file  Discontinued Medications   No medications on file     Physical Exam: Physical Exam  Constitutional: She appears well-developed.  HENT:  Head: Normocephalic and atraumatic.  Left Ear: External ear normal.  Eyes: Conjunctivae and EOM are normal. Pupils are equal, round, and reactive to light.  Neck: Neck supple. No JVD present. No thyromegaly present.  Cardiovascular: Normal rate and regular rhythm.   Murmur heard. Pulmonary/Chest: Effort  normal. She has rales.  Bibasilar rales.   Abdominal: Soft. There is no tenderness. There is no rebound.  Musculoskeletal: She exhibits edema and tenderness.  Back pain is chronic which is well controlled. BLE chronic edema trace only. Left foot external deviated deformity.   Lymphadenopathy:    She has no cervical adenopathy.  Neurological: She is alert. No cranial nerve deficit. Coordination normal.  Skin: Skin is warm and dry. Rash noted.  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 R+L Intergluteal cleft recurrent pressure ulcers in mirror image-much smaller.  Left forefoot diabetic ulcer--healed. Excoriated urogenital/buttocks     Psychiatric: Her mood appears anxious. Her affect is not blunt, not labile and not inappropriate. Her speech is rapid and/or pressured (occasionally ). Her speech is not delayed and not slurred. She is hyperactive. She is not agitated, not aggressive, not slowed, not withdrawn, not actively hallucinating and not combative. Thought content is not paranoid and not delusional. She does not exhibit a depressed mood. She exhibits abnormal recent memory.  MMSE 21/30 08/2013    Filed Vitals:   11/18/14 1539  BP: 127/72  Pulse: 78  Temp: 97.5 F (36.4 C)  TempSrc: Tympanic  Resp: 20      Labs reviewed: Basic Metabolic Panel:  Recent Labs  03/06/14 04/20/14 07/03/14 10/01/14  NA 138 136* 137 136*  K 4.6 4.9 4.2 4.9  BUN 33* 30* 26* 35*  CREATININE 0.9 0.8 0.8 0.9  TSH 4.31  --  3.77  --    Liver Function Tests:  Recent Labs  03/06/14 04/20/14 10/01/14  AST 20 19 20   ALT 8 12 13   ALKPHOS 85 79 80   CBC:  Recent Labs  03/06/14 04/20/14 10/01/14  WBC 5.8 6.4 6.6  HGB 13.4 14.3 14.5  HCT 41 42 43  PLT 214 260 229    Past Procedures:  10/22/13 CXR patchy interstitial changes left lower lung most consistent with pneumonia  11/21/13 CXR minimal cardiomegaly slightly increased without pulmonary vascular congestion or pleural effusion, no inflammatory  consolidate or suspicious nodule, interval resolution of interstitial pneumonitis at the left lower lung.   01/29/14 for minor head injury and dementia: head CT w/o contrast: no acute findings.   04/18/14 X-ray L knee/ankle/tibia/fibula: no acute fracture or dislocation  07/18/14 X-ray L foot: the findings could be consistent with inflammatory process or osteomyelitis. Neuropathic arthropathy not excluded.   09/30/14 X-ray R rib: negative for appreciable right rib fracture  Assessment/Plan A-fib Rate controlled. Propranolol was increased to 20mg  bid for better rate control in setting of HTN and essential tremor. Saw Dr. Mare Ferrari 02/03/14 doing well, EKG A-fib with controlled VR. 1year office visit EKG    Back pain Chronic-coccyalgia in nature, pain is controlled with Fentanyl 146mcg/hr     Constipation Stable, takes colace 100 II qhs and Linzess(off Amitiza 69mcg bid-therapeutic exchange by Pharm )and MiraLax prn.    Controlled type 2 DM with peripheral circulatory disorder 10/31/14 CBG am 149-228, [id number], pm 123-172-update Hgb A1cLantus to 14units. Novolog 8 u breakfast/dinner and 12 u lunch.    Dementia with behavioral disturbance Her MMSE 21/3010/04/2013. Given her hx of cardiovascular issues--vascular dementia is more likely. Tolerated  Namenda and increased Cymbalta to 30mg   bid. Mood is better controlled. Gradual decline in her cognition noted. Lack of safety awareness contributed to her frequent falling.     Depression Better with Cymbalta 30mg  bid and Namenda. Observe the patient.   06/24/14 dc Ativan: infrequently use(1 dose in 3 months)  10/03/14 stable.   11/18/14 mood is stable.   Dyspepsia 07/29/14 decreased Omeprazole to 20mg  daily-consultant pharmacist 09/30/14 stable.  11/18/14 stable.    Edema Chronic venous insufficiency. Takes Furosemide 40mg  bid and Spironolctone25mg  daily. Trace edema BLE     Essential tremor Mild head titubation and hands  tremor-not disabling. Takes Propranolol 10mg  bid   HTN (hypertension) Controlled, continue Losartan 25mg  daily, Atenolol, monitor blood pressure daily. Dr. Mare Ferrari 02/03/14 continue same cardiac meds.     Hypothyroidism Corrected with Levothyroxine 112.28mcg, last TSH 2.069 08/26/13 and 4.306 03/06/14 and 3.773 07/03/14    Ischemic heart disease angina occasionally, f/u Cardiology, continue with Imdur 60mg  and Prn NTG(last dose 02/12/13) and ASA 81mg .     Type 2 diabetes mellitus with diabetic neuropathy affecting both sides of body 10/31/14 CBG am 149-228, noon178-289, pm 123-172-update Hgb A1cLantus to 14units. Novolog 8 u breakfast/dinner and 12 u lunch.      Pressure ulcer stage II R+L buttocks-granulated wound bed with occasional bleeding-will apply Silvadene cream daily and prn until healed.     Family/ Staff Communication: observe the patient.   Goals of Care: SNF  Labs/tests ordered: none

## 2014-11-25 ENCOUNTER — Encounter: Payer: Self-pay | Admitting: Nurse Practitioner

## 2014-11-25 NOTE — Progress Notes (Signed)
This encounter was created in error - please disregard.

## 2014-11-28 ENCOUNTER — Encounter: Payer: Self-pay | Admitting: Nurse Practitioner

## 2014-11-28 ENCOUNTER — Non-Acute Institutional Stay (SKILLED_NURSING_FACILITY): Payer: Medicare Other | Admitting: Nurse Practitioner

## 2014-11-28 DIAGNOSIS — I482 Chronic atrial fibrillation, unspecified: Secondary | ICD-10-CM

## 2014-11-28 DIAGNOSIS — K5900 Constipation, unspecified: Secondary | ICD-10-CM

## 2014-11-28 DIAGNOSIS — F0391 Unspecified dementia with behavioral disturbance: Secondary | ICD-10-CM

## 2014-11-28 DIAGNOSIS — R609 Edema, unspecified: Secondary | ICD-10-CM

## 2014-11-28 DIAGNOSIS — E1142 Type 2 diabetes mellitus with diabetic polyneuropathy: Secondary | ICD-10-CM

## 2014-11-28 DIAGNOSIS — F03918 Unspecified dementia, unspecified severity, with other behavioral disturbance: Secondary | ICD-10-CM

## 2014-11-28 DIAGNOSIS — G250 Essential tremor: Secondary | ICD-10-CM

## 2014-11-28 DIAGNOSIS — E039 Hypothyroidism, unspecified: Secondary | ICD-10-CM

## 2014-11-28 DIAGNOSIS — F329 Major depressive disorder, single episode, unspecified: Secondary | ICD-10-CM

## 2014-11-28 DIAGNOSIS — R1013 Epigastric pain: Secondary | ICD-10-CM

## 2014-11-28 DIAGNOSIS — F32A Depression, unspecified: Secondary | ICD-10-CM

## 2014-11-28 DIAGNOSIS — I10 Essential (primary) hypertension: Secondary | ICD-10-CM

## 2014-11-28 DIAGNOSIS — M5440 Lumbago with sciatica, unspecified side: Secondary | ICD-10-CM

## 2014-11-28 NOTE — Assessment & Plan Note (Signed)
Her MMSE 21/3010/04/2013. Given her hx of cardiovascular issues--vascular dementia is more likely. Tolerated  Namenda and increased Cymbalta to 30mg  bid. Mood is better controlled. Gradual decline in her cognition noted. Lack of safety awareness contributed to her frequent falling.

## 2014-11-28 NOTE — Assessment & Plan Note (Signed)
Stable, takes colace 100 II qhs and Linzess(off Amitiza 63mcg bid-therapeutic exchange by Pharm )and MiraLax prn.

## 2014-11-28 NOTE — Assessment & Plan Note (Signed)
Better with Cymbalta 30mg  bid and Namenda. Observe the patient.   06/24/14 dc Ativan: infrequently use(1 dose in 3 months)  mood is stable.

## 2014-11-28 NOTE — Assessment & Plan Note (Signed)
Corrected with Levothyroxine 112.102mcg, last TSH 2.069 08/26/13 and 4.306 03/06/14 and 3.773 07/03/14

## 2014-11-28 NOTE — Assessment & Plan Note (Signed)
10/31/14 CBG am 149-228, [id number], pm 123-172-update Hgb A1cLantus to 14units. Novolog 8 u breakfast/dinner and 12 u lunch.  11/28/14 CBG 56 11/26/14 70 11/27/14 ac dinner--will reduce Novolog to 8 u ac meals for CBG over 150.

## 2014-11-28 NOTE — Progress Notes (Signed)
Patient ID: [name], female   DOB: January 08, 1926, 81 y.o.   MRN: [id number]   Code Status: DNR  Allergies  Allergen Reactions  . Penicillins Other (See Comments)    Per MAR  . Bee Venom Other (See Comments)    Per MAR  . Celebrex [Celecoxib] Other (See Comments)    Per MAR  . Lidocaine Hcl Other (See Comments)    Per MAR  . Phenobarbital Other (See Comments)    Per MAR  . Procaine Hcl Other (See Comments)    Per mar    Chief Complaint  Patient presents with  . Medical Management of Chronic Issues  . Acute Visit    hypoglycemia.     HPI: Patient is a 81 y.o. female seen in the SNF at [facility] today for evaluation of CBG 56, 70 ac dinner 1/13 and 1/14, and chronic medical conditions.     ED 01/29/14 for minor head injury and dementia: head CT w/o contrast: no acute findings.  Problem List Items Addressed This Visit    Type 2 diabetes mellitus with diabetic neuropathy affecting both sides of body - Primary (Chronic)    10/31/14 CBG am 149-228, noon178-289, pm 123-172-update Hgb A1cLantus to 14units. Novolog 8 u breakfast/dinner and 12 u lunch.  11/28/14 CBG 56 11/26/14 70 11/27/14 ac dinner--will reduce Novolog to 8 u ac meals for CBG over 150.         Hypothyroidism    Corrected with Levothyroxine 112.64mcg, last TSH 2.069 08/26/13 and 4.306 03/06/14 and 3.773 07/03/14       HTN (hypertension)    Controlled, continue Losartan 25mg  daily, Atenolol, monitor blood pressure daily. Dr. Mare Ferrari 02/03/14 continue same cardiac meds.         Essential tremor    Mild head titubation and hands tremor-not disabling. Takes Propranolol 10mg  bid        Edema    Chronic venous insufficiency. Takes Furosemide 40mg  bid and Spironolctone25mg  daily. Trace edema BLE        Dyspepsia    07/29/14 decreased Omeprazole to 20mg  daily-consultant pharmacist stable.        Depression    Better with Cymbalta 30mg  bid and Namenda. Observe the patient.   06/24/14 dc Ativan:  infrequently use(1 dose in 3 months)  mood is stable.        Dementia with behavioral disturbance    Her MMSE 21/3010/04/2013. Given her hx of cardiovascular issues--vascular dementia is more likely. Tolerated  Namenda and increased Cymbalta to 30mg  bid. Mood is better controlled. Gradual decline in her cognition noted. Lack of safety awareness contributed to her frequent falling.         Constipation    Stable, takes colace 100 II qhs and Linzess(off Amitiza 46mcg bid-therapeutic exchange by Pharm )and MiraLax prn.       Back pain    Chronic-coccyalgia in nature, pain is controlled with Fentanyl 180mcg/hr        A-fib    Rate controlled. Propranolol was increased to 20mg  bid for better rate control in setting of HTN and essential tremor. Saw Dr. Mare Ferrari 02/03/14 doing well, EKG A-fib with controlled VR. 1year office visit EKG          Review of Systems:  Review of Systems  Constitutional: Negative for fever, chills, weight loss, malaise/fatigue and diaphoresis.       Generalized weakness  HENT: Positive for hearing loss. Negative for congestion, ear discharge, ear pain, nosebleeds, sore throat and tinnitus.  Eyes: Negative for blurred vision, double vision, photophobia, pain, discharge and redness.  Respiratory: Negative for cough, hemoptysis, sputum production, shortness of breath, wheezing and stridor.   Cardiovascular: Positive for leg swelling. Negative for chest pain, palpitations, orthopnea, claudication and PND.  Gastrointestinal: Negative for heartburn, nausea, vomiting, abdominal pain, diarrhea, constipation, blood in stool and melena.  Genitourinary: Positive for frequency. Negative for dysuria, urgency, hematuria and flank pain.  Musculoskeletal: Positive for back pain, joint pain and falls. Negative for myalgias and neck pain.  Skin: Positive for rash. Negative for itching.       Buttock R+L pressure ulcers. Excoriated perineum-better. BLE chronic lipo sclero  dermatosis from chronic venous insufficiency.   Neurological: Positive for weakness. Negative for dizziness, tingling, tremors, sensory change, speech change, focal weakness, seizures, loss of consciousness and headaches.  Endo/Heme/Allergies: Negative for environmental allergies and polydipsia. Does not bruise/bleed easily.  Psychiatric/Behavioral: Positive for depression and memory loss. Negative for suicidal ideas, hallucinations and substance abuse. The patient is nervous/anxious and has insomnia.      Past Medical History  Diagnosis Date  . Diabetes mellitus   . Hypertension   . Heart murmur   . Benign familial tremor   . Rosacea   . Diverticulitis   . Heart failure     diastolic heart failure  . TIA (transient ischemic attack) 2010  . Arthritis   . Breast cancer     breast -rt  . CAD (coronary artery disease)     single vessel CAD per cath in 2006; scattered nonobstructive disease in the left system, left to right collaterals to the RCA which was occluded by flush shots; she has been managed medically   Past Surgical History  Procedure Laterality Date  . Mastectomy partial / lumpectomy  11/02/2000    Right, with SLN  . Mastectomy partial / lumpectomy  05/15/2008    Left  . Breast lumpectomy  01/19/2010    right  . Mastectomy  03/31/2010    Right   Social History:   reports that she has never smoked. She does not have any smokeless tobacco history on file. She reports that she does not drink alcohol or use illicit drugs.  Medications: Patient's Medications  New Prescriptions   No medications on file  Previous Medications   ACETAMINOPHEN (TYLENOL) 325 MG TABLET    Take 2 tablets (650 mg total) by mouth every 4 (four) hours as needed.   ALUM & MAG HYDROXIDE-SIMETH (MAALOX/MYLANTA) 200-200-20 MG/5ML SUSPENSION    Take 30 mLs by mouth every 6 (six) hours as needed for indigestion or heartburn.   ASPIRIN EC 81 MG TABLET    Take 81 mg by mouth daily.   AYR SALINE NASAL NO-DRIP  GEL    Place into the nose 2 (two) times daily as needed (nose bleeds).   DOCUSATE SODIUM (COLACE) 100 MG CAPSULE    Take 200 mg by mouth at bedtime.    DORZOLAMIDE-TIMOLOL (COSOPT) 22.3-6.8 MG/ML OPHTHALMIC SOLUTION    Place 1 drop into the left eye every 12 (twelve) hours.   DULOXETINE (CYMBALTA) 30 MG CAPSULE    Take 30 mg by mouth 2 (two) times daily.   FENTANYL (DURAGESIC - DOSED MCG/HR) 100 MCG/HR    Apply one patch topically every 3 days for pain   FUROSEMIDE (LASIX) 40 MG TABLET    Take 40 mg by mouth 2 (two) times daily.    GLUCAGON (GLUCAGEN) 1 MG SOLR INJECTION    Inject 1 mg into the  vein once as needed for low blood sugar.   GLUCOSAMINE-CHONDROITIN 500-400 MG TABLET    Take 1 tablet by mouth 3 (three) times daily.   GUAIFENESIN (ROBITUSSIN) 100 MG/5ML SYRUP    Take 300 mg by mouth 3 (three) times daily as needed for cough.   HYDROXYZINE (ATARAX/VISTARIL) 25 MG TABLET    Take 25 mg by mouth at bedtime as needed. For itching   INSULIN ASPART (NOVOLOG) 100 UNIT/ML INJECTION    Inject 8-12 Units into the skin 3 (three) times daily with meals. 8 units If CBG is greater than 150 at lunch and 12 units if CBG is greater than 150 at breakfast and dinner   INSULIN GLARGINE (LANTUS) 100 UNIT/ML INJECTION    Inject 16 Units into the skin at bedtime.    ISOSORBIDE MONONITRATE (IMDUR) 60 MG 24 HR TABLET    Take 60 mg by mouth every morning.    LEVOTHYROXINE (SYNTHROID, LEVOTHROID) 75 MCG TABLET    Take 75-112.5 mcg by mouth daily. Take 1 and 1/2 tablets on Saturdays then take 1 tablet all the other days   LORATADINE (CLARITIN) 10 MG TABLET    Take 10 mg by mouth daily.    LOSARTAN (COZAAR) 25 MG TABLET    Take 25 mg by mouth daily.   LUBIPROSTONE (AMITIZA) 24 MCG CAPSULE    Take 24 mcg by mouth 2 (two) times daily with a meal.     MEMANTINE HCL ER (NAMENDA XR) 28 MG CP24    Take by mouth.   METHYLCELLULOSE (ARTIFICIAL TEARS) 1 % OPHTHALMIC SOLUTION    Place 1 drop into both eyes 4 (four) times daily  as needed (for dryness).   MORPHINE (ROXANOL) 20 MG/ML CONCENTRATED SOLUTION    Take 5 mg by mouth every 4 (four) hours as needed for severe pain.   MULTIPLE VITAMINS-MINERALS (CERTAVITE SENIOR/ANTIOXIDANT PO)    Take 1 tablet by mouth daily.   NITROGLYCERIN (NITROSTAT) 0.4 MG SL TABLET    Place 0.4 mg under the tongue every 5 (five) minutes as needed. For chest pain   NUTRITIONAL SUPPLEMENTS (RESOURCE 2.0 PO)    Take 120 mLs by mouth 3 (three) times daily.   OMEPRAZOLE (PRILOSEC) 20 MG CAPSULE    Take 20 mg by mouth 2 (two) times daily.   PROBIOTIC PRODUCT (ALIGN) 4 MG CAPS    Take 1 capsule by mouth daily.   PROPRANOLOL (INDERAL) 20 MG TABLET    Take 20 mg by mouth 2 (two) times daily.   SPIRONOLACTONE (ALDACTONE) 25 MG TABLET    Take 25 mg by mouth daily.    VITAMIN D, ERGOCALCIFEROL, (DRISDOL) 50000 UNITS CAPS    Take 50,000 Units by mouth every 30 (thirty) days. On the first of the month  Modified Medications   No medications on file  Discontinued Medications   No medications on file     Physical Exam: Physical Exam  Constitutional: She appears well-developed.  HENT:  Head: Normocephalic and atraumatic.  Left Ear: External ear normal.  Eyes: Conjunctivae and EOM are normal. Pupils are equal, round, and reactive to light.  Neck: Neck supple. No JVD present. No thyromegaly present.  Cardiovascular: Normal rate and regular rhythm.   Murmur heard. Pulmonary/Chest: Effort normal. She has rales.  Bibasilar rales.   Abdominal: Soft. There is no tenderness. There is no rebound.  Musculoskeletal: She exhibits edema and tenderness.  Back pain is chronic which is well controlled. BLE chronic edema trace only. Left foot external deviated  deformity.   Lymphadenopathy:    She has no cervical adenopathy.  Neurological: She is alert. No cranial nerve deficit. Coordination normal.  Skin: Skin is warm and dry. Rash noted.  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 R+L Intergluteal cleft recurrent  pressure ulcers in mirror image-much smaller.  Left forefoot diabetic ulcer--healed. Excoriated urogenital/buttocks     Psychiatric: Her mood appears anxious. Her affect is not blunt, not labile and not inappropriate. Her speech is rapid and/or pressured (occasionally ). Her speech is not delayed and not slurred. She is hyperactive. She is not agitated, not aggressive, not slowed, not withdrawn, not actively hallucinating and not combative. Thought content is not paranoid and not delusional. She does not exhibit a depressed mood. She exhibits abnormal recent memory.  MMSE 21/30 08/2013    Filed Vitals:   11/28/14 1416  BP: 136/76  Pulse: 82  Temp: 97.8 F (36.6 C)  TempSrc: Tympanic  Resp: 18      Labs reviewed: Basic Metabolic Panel:  Recent Labs  03/06/14 04/20/14 07/03/14 10/01/14  NA 138 136* 137 136*  K 4.6 4.9 4.2 4.9  BUN 33* 30* 26* 35*  CREATININE 0.9 0.8 0.8 0.9  TSH 4.31  --  3.77  --    Liver Function Tests:  Recent Labs  03/06/14 04/20/14 10/01/14  AST 20 19 20   ALT 8 12 13   ALKPHOS 85 79 80   CBC:  Recent Labs  03/06/14 04/20/14 10/01/14  WBC 5.8 6.4 6.6  HGB 13.4 14.3 14.5  HCT 41 42 43  PLT 214 260 229    Past Procedures:  10/22/13 CXR patchy interstitial changes left lower lung most consistent with pneumonia  11/21/13 CXR minimal cardiomegaly slightly increased without pulmonary vascular congestion or pleural effusion, no inflammatory consolidate or suspicious nodule, interval resolution of interstitial pneumonitis at the left lower lung.   01/29/14 for minor head injury and dementia: head CT w/o contrast: no acute findings.   04/18/14 X-ray L knee/ankle/tibia/fibula: no acute fracture or dislocation  07/18/14 X-ray L foot: the findings could be consistent with inflammatory process or osteomyelitis. Neuropathic arthropathy not excluded.   09/30/14 X-ray R rib: negative for appreciable right rib fracture  Assessment/Plan Type 2 diabetes mellitus  with diabetic neuropathy affecting both sides of body 10/31/14 CBG am 149-228, noon178-289, pm 123-172-update Hgb A1cLantus to 14units. Novolog 8 u breakfast/dinner and 12 u lunch.  11/28/14 CBG 56 11/26/14 70 11/27/14 ac dinner--will reduce Novolog to 8 u ac meals for CBG over 150.      Hypothyroidism Corrected with Levothyroxine 112.88mcg, last TSH 2.069 08/26/13 and 4.306 03/06/14 and 3.773 07/03/14    HTN (hypertension) Controlled, continue Losartan 25mg  daily, Atenolol, monitor blood pressure daily. Dr. Mare Ferrari 02/03/14 continue same cardiac meds.      Essential tremor Mild head titubation and hands tremor-not disabling. Takes Propranolol 10mg  bid     Edema Chronic venous insufficiency. Takes Furosemide 40mg  bid and Spironolctone25mg  daily. Trace edema BLE     Dyspepsia 07/29/14 decreased Omeprazole to 20mg  daily-consultant pharmacist stable.     Depression Better with Cymbalta 30mg  bid and Namenda. Observe the patient.   06/24/14 dc Ativan: infrequently use(1 dose in 3 months)  mood is stable.     Dementia with behavioral disturbance Her MMSE 21/3010/04/2013. Given her hx of cardiovascular issues--vascular dementia is more likely. Tolerated  Namenda and increased Cymbalta to 30mg  bid. Mood is better controlled. Gradual decline in her cognition noted. Lack of safety awareness contributed to her frequent falling.  Constipation Stable, takes colace 100 II qhs and Linzess(off Amitiza 29mcg bid-therapeutic exchange by Pharm )and MiraLax prn.    Back pain Chronic-coccyalgia in nature, pain is controlled with Fentanyl 116mcg/hr     A-fib Rate controlled. Propranolol was increased to 20mg  bid for better rate control in setting of HTN and essential tremor. Saw Dr. Mare Ferrari 02/03/14 doing well, EKG A-fib with controlled VR. 1year office visit EKG      Family/ Staff Communication: observe the patient.   Goals of Care: SNF  Labs/tests ordered:  none

## 2014-11-28 NOTE — Assessment & Plan Note (Signed)
Chronic venous insufficiency. Takes Furosemide 40mg  bid and Spironolctone25mg  daily. Trace edema BLE

## 2014-11-28 NOTE — Assessment & Plan Note (Signed)
Controlled, continue Losartan 25mg  daily, Atenolol, monitor blood pressure daily. Dr. Mare Ferrari 02/03/14 continue same cardiac meds.

## 2014-11-28 NOTE — Assessment & Plan Note (Signed)
Rate controlled. Propranolol was increased to 20mg  bid for better rate control in setting of HTN and essential tremor. Saw Dr. Mare Ferrari 02/03/14 doing well, EKG A-fib with controlled VR. 1year office visit EKG

## 2014-11-28 NOTE — Assessment & Plan Note (Signed)
07/29/14 decreased Omeprazole to 20mg  daily-consultant pharmacist stable.

## 2014-11-28 NOTE — Assessment & Plan Note (Signed)
Mild head titubation and hands tremor-not disabling. Takes Propranolol 10mg  bid

## 2014-11-28 NOTE — Assessment & Plan Note (Signed)
Chronic-coccyalgia in nature, pain is controlled with Fentanyl 159mcg/hr

## 2014-12-30 ENCOUNTER — Non-Acute Institutional Stay (SKILLED_NURSING_FACILITY): Payer: Medicare Other | Admitting: Nurse Practitioner

## 2014-12-30 DIAGNOSIS — I259 Chronic ischemic heart disease, unspecified: Secondary | ICD-10-CM

## 2014-12-30 DIAGNOSIS — I10 Essential (primary) hypertension: Secondary | ICD-10-CM

## 2014-12-30 DIAGNOSIS — E1151 Type 2 diabetes mellitus with diabetic peripheral angiopathy without gangrene: Secondary | ICD-10-CM

## 2014-12-30 DIAGNOSIS — F03918 Unspecified dementia, unspecified severity, with other behavioral disturbance: Secondary | ICD-10-CM

## 2014-12-30 DIAGNOSIS — R609 Edema, unspecified: Secondary | ICD-10-CM

## 2014-12-30 DIAGNOSIS — I482 Chronic atrial fibrillation, unspecified: Secondary | ICD-10-CM

## 2014-12-30 DIAGNOSIS — R296 Repeated falls: Secondary | ICD-10-CM

## 2014-12-30 DIAGNOSIS — K5900 Constipation, unspecified: Secondary | ICD-10-CM

## 2014-12-30 DIAGNOSIS — F329 Major depressive disorder, single episode, unspecified: Secondary | ICD-10-CM

## 2014-12-30 DIAGNOSIS — F32A Depression, unspecified: Secondary | ICD-10-CM

## 2014-12-30 DIAGNOSIS — F0391 Unspecified dementia with behavioral disturbance: Secondary | ICD-10-CM

## 2014-12-30 DIAGNOSIS — R1013 Epigastric pain: Secondary | ICD-10-CM

## 2014-12-30 DIAGNOSIS — E039 Hypothyroidism, unspecified: Secondary | ICD-10-CM

## 2014-12-30 DIAGNOSIS — M5440 Lumbago with sciatica, unspecified side: Secondary | ICD-10-CM

## 2014-12-30 DIAGNOSIS — I798 Other disorders of arteries, arterioles and capillaries in diseases classified elsewhere: Secondary | ICD-10-CM

## 2014-12-30 DIAGNOSIS — E1142 Type 2 diabetes mellitus with diabetic polyneuropathy: Secondary | ICD-10-CM

## 2014-12-30 DIAGNOSIS — G250 Essential tremor: Secondary | ICD-10-CM

## 2014-12-30 NOTE — Assessment & Plan Note (Signed)
Chronic-coccyalgia in nature, pain is controlled with Fentanyl 180mcg/hr

## 2014-12-30 NOTE — Assessment & Plan Note (Signed)
10/31/14 CBG am 149-228, [id number], pm 123-172-update Hgb A1cLantus to 14units. Novolog 8 u breakfast/dinner and 12 u lunch.  11/03/14 Hgb a1c 8.3 11/28/14 CBG 56 11/26/14 70 11/27/14 ac dinner--will reduce Novolog to 8 u ac meals for CBG over 150.  12/29/14 CBG am 134-235, noon 200-243, pm 59-210, update Hgb A1c in one month.

## 2014-12-30 NOTE — Assessment & Plan Note (Signed)
Rate controlled. Propranolol was increased to 20mg  bid for better rate control in setting of HTN and essential tremor. Saw Dr. Mare Ferrari 02/03/14 doing well, EKG A-fib with controlled VR. 1year office visit EKG

## 2014-12-30 NOTE — Assessment & Plan Note (Signed)
No new open wounds

## 2014-12-30 NOTE — Assessment & Plan Note (Signed)
Her MMSE 21/3010/04/2013. Given her hx of cardiovascular issues--vascular dementia is more likely. Tolerated  Namenda and increased Cymbalta to 30mg  bid. Mood is better controlled. Gradual decline in her cognition noted. Lack of safety awareness contributed to her frequent falling.

## 2014-12-30 NOTE — Assessment & Plan Note (Signed)
Controlled, continue Losartan 25mg  daily, Atenolol, monitor blood pressure daily. Dr. Mare Ferrari 02/03/14 continue same cardiac meds.

## 2014-12-30 NOTE — Assessment & Plan Note (Signed)
Better with Cymbalta 30mg  bid and Namenda. Observe the patient.   06/24/14 dc Ativan: infrequently use(1 dose in 3 months)  mood is stable.

## 2014-12-30 NOTE — Assessment & Plan Note (Signed)
07/29/14 decreased Omeprazole to 20mg  daily-consultant pharmacist stable.

## 2014-12-30 NOTE — Assessment & Plan Note (Signed)
Corrected with Levothyroxine 112.53mcg, last TSH 2.069 08/26/13 and 4.306 03/06/14 and 3.773 07/03/14

## 2014-12-30 NOTE — Assessment & Plan Note (Signed)
10/29/15 fall w/o injury

## 2014-12-30 NOTE — Assessment & Plan Note (Signed)
angina occasionally, f/u Cardiology, continue with Imdur 60mg  and Prn NTG(last dose 02/12/13) and ASA 81mg .

## 2014-12-30 NOTE — Progress Notes (Signed)
Patient ID: [name], female   DOB: 06-09-1926, 81 y.o.   MRN: [id number]   Code Status: DNR  Allergies  Allergen Reactions  . Penicillins Other (See Comments)    Per MAR  . Bee Venom Other (See Comments)    Per MAR  . Celebrex [Celecoxib] Other (See Comments)    Per MAR  . Lidocaine Hcl Other (See Comments)    Per MAR  . Phenobarbital Other (See Comments)    Per MAR  . Procaine Hcl Other (See Comments)    Per mar    Chief Complaint  Patient presents with  . Medical Management of Chronic Issues  . Acute Visit    fall, blood sugar    HPI: Patient is a 81 y.o. female seen in the SNF at [facility] today for evaluation of s/p falling 12/29/14 and chronic medical conditions.     ED 01/29/14 for minor head injury and dementia: head CT w/o contrast: no acute findings.  Problem List Items Addressed This Visit    Type 2 diabetes mellitus with diabetic neuropathy affecting both sides of body - Primary (Chronic)    10/31/14 CBG am 149-228, noon178-289, pm 123-172-update Hgb A1cLantus to 14units. Novolog 8 u breakfast/dinner and 12 u lunch.  11/03/14 Hgb a1c 8.3 11/28/14 CBG 56 11/26/14 70 11/27/14 ac dinner--will reduce Novolog to 8 u ac meals for CBG over 150.  12/29/14 CBG am 134-235, noon 200-243, pm 59-210, update Hgb A1c in one month.         Ischemic heart disease    angina occasionally, f/u Cardiology, continue with Imdur 60mg  and Prn NTG(last dose 02/12/13) and ASA 81mg .         Hypothyroidism    Corrected with Levothyroxine 112.84mcg, last TSH 2.069 08/26/13 and 4.306 03/06/14 and 3.773 07/03/14       HTN (hypertension)    Controlled, continue Losartan 25mg  daily, Atenolol, monitor blood pressure daily. Dr. Mare Ferrari 02/03/14 continue same cardiac meds.        Falls frequently    10/29/15 fall w/o injury       Essential tremor    Mild head titubation and hands tremor-not disabling. Takes Propranolol 10mg  bid        Edema    Chronic venous insufficiency.  Takes Furosemide 40mg  bid and Spironolctone25mg  daily. Trace edema BLE       Dyspepsia    07/29/14 decreased Omeprazole to 20mg  daily-consultant pharmacist stable.        Depression    Better with Cymbalta 30mg  bid and Namenda. Observe the patient.   06/24/14 dc Ativan: infrequently use(1 dose in 3 months)  mood is stable.         Dementia with behavioral disturbance    Her MMSE 21/3010/04/2013. Given her hx of cardiovascular issues--vascular dementia is more likely. Tolerated  Namenda and increased Cymbalta to 30mg  bid. Mood is better controlled. Gradual decline in her cognition noted. Lack of safety awareness contributed to her frequent falling.         Controlled type 2 DM with peripheral circulatory disorder (Chronic)    No new open wounds      Constipation    Stable, takes colace 100 II qhs and Linzess(off Amitiza 67mcg bid-therapeutic exchange by Pharm )and MiraLax prn.        Back pain    Chronic-coccyalgia in nature, pain is controlled with Fentanyl 152mcg/hr        A-fib    Rate controlled. Propranolol was increased to  20mg  bid for better rate control in setting of HTN and essential tremor. Saw Dr. Mare Ferrari 02/03/14 doing well, EKG A-fib with controlled VR. 1year office visit EKG          Review of Systems:  Review of Systems  Constitutional: Negative for fever, chills, weight loss, malaise/fatigue and diaphoresis.  HENT: Positive for hearing loss. Negative for congestion, ear discharge, ear pain, nosebleeds, sore throat and tinnitus.   Eyes: Negative for blurred vision, double vision, photophobia, pain, discharge and redness.  Respiratory: Negative for cough, hemoptysis, sputum production, shortness of breath, wheezing and stridor.   Cardiovascular: Positive for leg swelling. Negative for chest pain, palpitations, orthopnea, claudication and PND.  Gastrointestinal: Negative for heartburn, nausea, vomiting, abdominal pain, diarrhea, constipation, blood in  stool and melena.  Genitourinary: Positive for frequency. Negative for dysuria, urgency, hematuria and flank pain.  Musculoskeletal: Positive for back pain, joint pain and falls. Negative for myalgias and neck pain.  Skin: Positive for itching and rash.  Neurological: Positive for sensory change. Negative for dizziness, tingling, tremors, speech change, focal weakness, seizures, loss of consciousness, weakness and headaches.  Endo/Heme/Allergies: Negative for environmental allergies and polydipsia. Does not bruise/bleed easily.  Psychiatric/Behavioral: Positive for depression and memory loss. Negative for suicidal ideas, hallucinations and substance abuse. The patient is nervous/anxious and has insomnia.      Past Medical History  Diagnosis Date  . Diabetes mellitus   . Hypertension   . Heart murmur   . Benign familial tremor   . Rosacea   . Diverticulitis   . Heart failure     diastolic heart failure  . TIA (transient ischemic attack) 2010  . Arthritis   . Breast cancer     breast -rt  . CAD (coronary artery disease)     single vessel CAD per cath in 2006; scattered nonobstructive disease in the left system, left to right collaterals to the RCA which was occluded by flush shots; she has been managed medically   Past Surgical History  Procedure Laterality Date  . Mastectomy partial / lumpectomy  11/02/2000    Right, with SLN  . Mastectomy partial / lumpectomy  05/15/2008    Left  . Breast lumpectomy  01/19/2010    right  . Mastectomy  03/31/2010    Right   Social History:   reports that she has never smoked. She does not have any smokeless tobacco history on file. She reports that she does not drink alcohol or use illicit drugs.  Medications: Patient's Medications  New Prescriptions   No medications on file  Previous Medications   ACETAMINOPHEN (TYLENOL) 325 MG TABLET    Take 2 tablets (650 mg total) by mouth every 4 (four) hours as needed.   ALUM & MAG HYDROXIDE-SIMETH  (MAALOX/MYLANTA) 200-200-20 MG/5ML SUSPENSION    Take 30 mLs by mouth every 6 (six) hours as needed for indigestion or heartburn.   ASPIRIN EC 81 MG TABLET    Take 81 mg by mouth daily.   AYR SALINE NASAL NO-DRIP GEL    Place into the nose 2 (two) times daily as needed (nose bleeds).   DOCUSATE SODIUM (COLACE) 100 MG CAPSULE    Take 200 mg by mouth at bedtime.    DORZOLAMIDE-TIMOLOL (COSOPT) 22.3-6.8 MG/ML OPHTHALMIC SOLUTION    Place 1 drop into the left eye every 12 (twelve) hours.   DULOXETINE (CYMBALTA) 30 MG CAPSULE    Take 30 mg by mouth 2 (two) times daily.   FENTANYL ([place] -  DOSED MCG/HR) 100 MCG/HR    Apply one patch topically every 3 days for pain   FUROSEMIDE (LASIX) 40 MG TABLET    Take 40 mg by mouth 2 (two) times daily.    GLUCAGON (GLUCAGEN) 1 MG SOLR INJECTION    Inject 1 mg into the vein once as needed for low blood sugar.   GLUCOSAMINE-CHONDROITIN 500-400 MG TABLET    Take 1 tablet by mouth 3 (three) times daily.   GUAIFENESIN (ROBITUSSIN) 100 MG/5ML SYRUP    Take 300 mg by mouth 3 (three) times daily as needed for cough.   HYDROXYZINE (ATARAX/VISTARIL) 25 MG TABLET    Take 25 mg by mouth at bedtime as needed. For itching   INSULIN ASPART (NOVOLOG) 100 UNIT/ML INJECTION    Inject 8-12 Units into the skin 3 (three) times daily with meals. 8 units If CBG is greater than 150 at lunch and 12 units if CBG is greater than 150 at breakfast and dinner   INSULIN GLARGINE (LANTUS) 100 UNIT/ML INJECTION    Inject 16 Units into the skin at bedtime.    ISOSORBIDE MONONITRATE (IMDUR) 60 MG 24 HR TABLET    Take 60 mg by mouth every morning.    LEVOTHYROXINE (SYNTHROID, LEVOTHROID) 75 MCG TABLET    Take 75-112.5 mcg by mouth daily. Take 1 and 1/2 tablets on Saturdays then take 1 tablet all the other days   LORATADINE (CLARITIN) 10 MG TABLET    Take 10 mg by mouth daily.    LOSARTAN (COZAAR) 25 MG TABLET    Take 25 mg by mouth daily.   LUBIPROSTONE (AMITIZA) 24 MCG CAPSULE    Take 24 mcg by  mouth 2 (two) times daily with a meal.     MEMANTINE HCL ER (NAMENDA XR) 28 MG CP24    Take by mouth.   METHYLCELLULOSE (ARTIFICIAL TEARS) 1 % OPHTHALMIC SOLUTION    Place 1 drop into both eyes 4 (four) times daily as needed (for dryness).   MORPHINE (ROXANOL) 20 MG/ML CONCENTRATED SOLUTION    Take 5 mg by mouth every 4 (four) hours as needed for severe pain.   MULTIPLE VITAMINS-MINERALS (CERTAVITE SENIOR/ANTIOXIDANT PO)    Take 1 tablet by mouth daily.   NITROGLYCERIN (NITROSTAT) 0.4 MG SL TABLET    Place 0.4 mg under the tongue every 5 (five) minutes as needed. For chest pain   NUTRITIONAL SUPPLEMENTS (RESOURCE 2.0 PO)    Take 120 mLs by mouth 3 (three) times daily.   OMEPRAZOLE (PRILOSEC) 20 MG CAPSULE    Take 20 mg by mouth 2 (two) times daily.   PROBIOTIC PRODUCT (ALIGN) 4 MG CAPS    Take 1 capsule by mouth daily.   PROPRANOLOL (INDERAL) 20 MG TABLET    Take 20 mg by mouth 2 (two) times daily.   SPIRONOLACTONE (ALDACTONE) 25 MG TABLET    Take 25 mg by mouth daily.    VITAMIN D, ERGOCALCIFEROL, (DRISDOL) 50000 UNITS CAPS    Take 50,000 Units by mouth every 30 (thirty) days. On the first of the month  Modified Medications   No medications on file  Discontinued Medications   No medications on file     Physical Exam: Physical Exam  Constitutional: She appears well-developed.  HENT:  Head: Normocephalic and atraumatic.  Left Ear: External ear normal.  Eyes: Conjunctivae and EOM are normal. Pupils are equal, round, and reactive to light.  Neck: Neck supple. No JVD present. No thyromegaly present.  Cardiovascular: Normal rate and  regular rhythm.   Murmur heard. Pulmonary/Chest: Effort normal. She has rales.  Bibasilar rales.   Abdominal: Soft. There is no tenderness. There is no rebound.  Musculoskeletal: She exhibits edema and tenderness.  Back pain is chronic which is well controlled. BLE chronic edema trace only. Left foot external deviated deformity.   Lymphadenopathy:    She has  no cervical adenopathy.  Neurological: She is alert. No cranial nerve deficit. Coordination normal.  Skin: Skin is warm and dry. Rash noted.  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 R+L Intergluteal cleft recurrent pressure ulcers in mirror image-much smaller.  Left forefoot diabetic ulcer--healed. Excoriated urogenital/buttocks     Psychiatric: Her mood appears anxious. Her affect is not blunt, not labile and not inappropriate. Her speech is rapid and/or pressured (occasionally ). Her speech is not delayed and not slurred. She is hyperactive. She is not agitated, not aggressive, not slowed, not withdrawn, not actively hallucinating and not combative. Thought content is not paranoid and not delusional. She does not exhibit a depressed mood. She exhibits abnormal recent memory.  MMSE 21/30 08/2013    Filed Vitals:   12/30/14 1259  BP: 144/97  Pulse: 72  Temp: 97.1 F (36.2 C)  TempSrc: Tympanic  Resp: 16      Labs reviewed: Basic Metabolic Panel:  Recent Labs  03/06/14 04/20/14 07/03/14 10/01/14  NA 138 136* 137 136*  K 4.6 4.9 4.2 4.9  BUN 33* 30* 26* 35*  CREATININE 0.9 0.8 0.8 0.9  TSH 4.31  --  3.77  --    Liver Function Tests:  Recent Labs  03/06/14 04/20/14 10/01/14  AST 20 19 20   ALT 8 12 13   ALKPHOS 85 79 80   CBC:  Recent Labs  03/06/14 04/20/14 10/01/14  WBC 5.8 6.4 6.6  HGB 13.4 14.3 14.5  HCT 41 42 43  PLT 214 260 229    Past Procedures:  10/22/13 CXR patchy interstitial changes left lower lung most consistent with pneumonia  11/21/13 CXR minimal cardiomegaly slightly increased without pulmonary vascular congestion or pleural effusion, no inflammatory consolidate or suspicious nodule, interval resolution of interstitial pneumonitis at the left lower lung.   01/29/14 for minor head injury and dementia: head CT w/o contrast: no acute findings.   04/18/14 X-ray L knee/ankle/tibia/fibula: no acute fracture or dislocation  07/18/14 X-ray L foot: the findings could  be consistent with inflammatory process or osteomyelitis. Neuropathic arthropathy not excluded.   09/30/14 X-ray R rib: negative for appreciable right rib fracture  Assessment/Plan Type 2 diabetes mellitus with diabetic neuropathy affecting both sides of body 10/31/14 CBG am 149-228, noon178-289, pm 123-172-update Hgb A1cLantus to 14units. Novolog 8 u breakfast/dinner and 12 u lunch.  11/03/14 Hgb a1c 8.3 11/28/14 CBG 56 11/26/14 70 11/27/14 ac dinner--will reduce Novolog to 8 u ac meals for CBG over 150.  12/29/14 CBG am 134-235, noon 200-243, pm 59-210, update Hgb A1c in one month.      Falls frequently 10/29/15 fall w/o injury    Dementia with behavioral disturbance Her MMSE 21/3010/04/2013. Given her hx of cardiovascular issues--vascular dementia is more likely. Tolerated  Namenda and increased Cymbalta to 30mg  bid. Mood is better controlled. Gradual decline in her cognition noted. Lack of safety awareness contributed to her frequent falling.      A-fib Rate controlled. Propranolol was increased to 20mg  bid for better rate control in setting of HTN and essential tremor. Saw Dr. Mare Ferrari 02/03/14 doing well, EKG A-fib with controlled VR. 1year office visit EKG  Edema Chronic venous insufficiency. Takes Furosemide 40mg  bid and Spironolctone25mg  daily. Trace edema BLE    Constipation Stable, takes colace 100 II qhs and Linzess(off Amitiza 49mcg bid-therapeutic exchange by Pharm )and MiraLax prn.     HTN (hypertension) Controlled, continue Losartan 25mg  daily, Atenolol, monitor blood pressure daily. Dr. Mare Ferrari 02/03/14 continue same cardiac meds.     Back pain Chronic-coccyalgia in nature, pain is controlled with Fentanyl 133mcg/hr     Essential tremor Mild head titubation and hands tremor-not disabling. Takes Propranolol 10mg  bid     Depression Better with Cymbalta 30mg  bid and Namenda. Observe the patient.   06/24/14 dc Ativan: infrequently use(1 dose in 3  months)  mood is stable.      Hypothyroidism Corrected with Levothyroxine 112.15mcg, last TSH 2.069 08/26/13 and 4.306 03/06/14 and 3.773 07/03/14    Dyspepsia 07/29/14 decreased Omeprazole to 20mg  daily-consultant pharmacist stable.     Ischemic heart disease angina occasionally, f/u Cardiology, continue with Imdur 60mg  and Prn NTG(last dose 02/12/13) and ASA 81mg .      Controlled type 2 DM with peripheral circulatory disorder No new open wounds     Family/ Staff Communication: observe the patient.   Goals of Care: SNF  Labs/tests ordered:Hgb A1c next month.

## 2014-12-30 NOTE — Assessment & Plan Note (Signed)
Chronic venous insufficiency. Takes Furosemide 40mg  bid and Spironolctone25mg  daily. Trace edema BLE

## 2014-12-30 NOTE — Assessment & Plan Note (Signed)
Mild head titubation and hands tremor-not disabling. Takes Propranolol 10mg  bid

## 2014-12-30 NOTE — Assessment & Plan Note (Signed)
Stable, takes colace 100 II qhs and Linzess(off Amitiza 42mcg bid-therapeutic exchange by Pharm )and MiraLax prn.

## 2015-01-26 LAB — HEMOGLOBIN A1C: Hgb A1c MFr Bld: 9.2 % — AB (ref 4.0–6.0)

## 2015-01-27 ENCOUNTER — Encounter: Payer: Self-pay | Admitting: Nurse Practitioner

## 2015-01-27 ENCOUNTER — Non-Acute Institutional Stay (SKILLED_NURSING_FACILITY): Payer: Medicare Other | Admitting: Nurse Practitioner

## 2015-01-27 DIAGNOSIS — M5440 Lumbago with sciatica, unspecified side: Secondary | ICD-10-CM

## 2015-01-27 DIAGNOSIS — F03918 Unspecified dementia, unspecified severity, with other behavioral disturbance: Secondary | ICD-10-CM

## 2015-01-27 DIAGNOSIS — F0391 Unspecified dementia with behavioral disturbance: Secondary | ICD-10-CM | POA: Diagnosis not present

## 2015-01-27 DIAGNOSIS — I482 Chronic atrial fibrillation, unspecified: Secondary | ICD-10-CM

## 2015-01-27 DIAGNOSIS — E1142 Type 2 diabetes mellitus with diabetic polyneuropathy: Secondary | ICD-10-CM

## 2015-01-27 DIAGNOSIS — K5900 Constipation, unspecified: Secondary | ICD-10-CM

## 2015-01-27 DIAGNOSIS — I259 Chronic ischemic heart disease, unspecified: Secondary | ICD-10-CM | POA: Diagnosis not present

## 2015-01-27 DIAGNOSIS — F329 Major depressive disorder, single episode, unspecified: Secondary | ICD-10-CM | POA: Diagnosis not present

## 2015-01-27 DIAGNOSIS — G250 Essential tremor: Secondary | ICD-10-CM

## 2015-01-27 DIAGNOSIS — R1013 Epigastric pain: Secondary | ICD-10-CM

## 2015-01-27 DIAGNOSIS — E039 Hypothyroidism, unspecified: Secondary | ICD-10-CM

## 2015-01-27 DIAGNOSIS — F32A Depression, unspecified: Secondary | ICD-10-CM

## 2015-01-27 DIAGNOSIS — R609 Edema, unspecified: Secondary | ICD-10-CM | POA: Diagnosis not present

## 2015-01-27 DIAGNOSIS — I10 Essential (primary) hypertension: Secondary | ICD-10-CM | POA: Diagnosis not present

## 2015-01-27 NOTE — Assessment & Plan Note (Signed)
Corrected with Levothyroxine 112.66mcg, last TSH 2.069 08/26/13 and 4.306 03/06/14 and 3.773 07/03/14-update TSH

## 2015-01-27 NOTE — Assessment & Plan Note (Signed)
A1c 7.8 03/06/14 07/03/14 Hgb A1c 8.1 09/02/14 CBGs am 88-203, noon 118-285, pm 102-257 10/01/14 CBG 94 10/31/14 CBG am 149-228, POIP189-842, pm 123-172-update Hgb A1c 11/03/14 Hgb A1c 8.3 01/23/15 Pharm: increase Lantus to 16u 2nd to morning hyperglycemia. Continue Novolog 8 u with meals(CBG>150) 01/26/15 Hgb A1c 9.2-increase Lantus to 20 u-am CBG 181-231, change Novolog to 5 u with meals.

## 2015-01-27 NOTE — Progress Notes (Signed)
Patient ID: [name], female   DOB: Mar 21, 1926, 81 y.o.   MRN: [id number]   Code Status: DNR  Allergies  Allergen Reactions  . Penicillins Other (See Comments)    Per MAR  . Bee Venom Other (See Comments)    Per MAR  . Celebrex [Celecoxib] Other (See Comments)    Per MAR  . Lidocaine Hcl Other (See Comments)    Per MAR  . Phenobarbital Other (See Comments)    Per MAR  . Procaine Hcl Other (See Comments)    Per mar    Chief Complaint  Patient presents with  . Medical Management of Chronic Issues  . Acute Visit    blood sugar    HPI: Patient is a 81 y.o. female seen in the SNF at [facility] today for evaluation of blood sugar and other chronic medical conditions.  Problem List Items Addressed This Visit    Type 2 diabetes mellitus with diabetic neuropathy affecting both sides of body - Primary (Chronic)    A1c 7.8 03/06/14 07/03/14 Hgb A1c 8.1 09/02/14 CBGs am 88-203, noon 118-285, pm 102-257 10/01/14 CBG 94 10/31/14 CBG am 149-228, [id number], pm 123-172-update Hgb A1c 11/03/14 Hgb A1c 8.3 01/23/15 Pharm: increase Lantus to 16u 2nd to morning hyperglycemia. Continue Novolog 8 u with meals(CBG>150) 01/26/15 Hgb A1c 9.2-increase Lantus to 20 u-am CBG 181-231, change Novolog to 5 u with meals.         Ischemic heart disease    angina occasionally, f/u Cardiology, continue with Imdur 60mg  and Prn NTG(last dose 02/12/13) and ASA 81mg .          Dyspepsia    07/29/14 decreased Omeprazole to 20mg  daily-consultant pharmacist stable.         Hypothyroidism    Corrected with Levothyroxine 112.53mcg, last TSH 2.069 08/26/13 and 4.306 03/06/14 and 3.773 07/03/14-update TSH      Depression    Better with Cymbalta 30mg  bid and Namenda. Observe the patient.   06/24/14 dc Ativan: infrequently use(1 dose in 3 months)  mood is stable.        Essential tremor    Mild head titubation and hands tremor-not disabling. Takes Propranolol 10mg  bid        Back pain   Chronic-coccyalgia in nature, pain is controlled with Fentanyl 157mcg/hr         HTN (hypertension)    Controlled, continue Losartan 25mg  daily, Atenolol, monitor blood pressure daily. Dr. Mare Ferrari 02/03/14 continue same cardiac meds.        Constipation    Stable, takes colace 100 II qhs and Linzess(off Amitiza 29mcg bid-therapeutic exchange by Pharm )and MiraLax prn.       Edema    Chronic venous insufficiency. Takes Furosemide 40mg  bid and Spironolctone25mg  daily. Trace edema BLE       A-fib    Rate controlled. Propranolol was increased to 20mg  bid for better rate control in setting of HTN and essential tremor. Saw Dr. Mare Ferrari 02/03/14 doing well, EKG A-fib with controlled VR. 1year office visit EKG      Dementia with behavioral disturbance    Her MMSE 21/3010/04/2013. Given her hx of cardiovascular issues--vascular dementia is more likely. Tolerated  Namenda and increased Cymbalta to 30mg  bid. Mood is better controlled. Gradual decline in her cognition noted. Lack of safety awareness contributed to her frequent falling.           Review of Systems:  Review of Systems  Constitutional: Negative for fever, chills and diaphoresis.  HENT: Positive for hearing loss. Negative for congestion, ear discharge, ear pain, nosebleeds, sore throat and tinnitus.   Eyes: Negative for photophobia, pain, discharge and redness.  Respiratory: Negative for cough, shortness of breath, wheezing and stridor.   Cardiovascular: Positive for leg swelling. Negative for chest pain and palpitations.  Gastrointestinal: Negative for nausea, vomiting, abdominal pain, diarrhea, constipation and blood in stool.  Endocrine: Negative for polydipsia.  Genitourinary: Positive for frequency. Negative for dysuria, urgency, hematuria and flank pain.  Musculoskeletal: Positive for back pain. Negative for myalgias and neck pain.  Skin: Positive for rash.  Allergic/Immunologic: Negative for environmental  allergies.  Neurological: Negative for dizziness, tremors, seizures, weakness and headaches.  Hematological: Does not bruise/bleed easily.  Psychiatric/Behavioral: Negative for suicidal ideas and hallucinations. The patient is nervous/anxious.      Past Medical History  Diagnosis Date  . Diabetes mellitus   . Hypertension   . Heart murmur   . Benign familial tremor   . Rosacea   . Diverticulitis   . Heart failure     diastolic heart failure  . TIA (transient ischemic attack) 2010  . Arthritis   . Breast cancer     breast -rt  . CAD (coronary artery disease)     single vessel CAD per cath in 2006; scattered nonobstructive disease in the left system, left to right collaterals to the RCA which was occluded by flush shots; she has been managed medically   Past Surgical History  Procedure Laterality Date  . Mastectomy partial / lumpectomy  11/02/2000    Right, with SLN  . Mastectomy partial / lumpectomy  05/15/2008    Left  . Breast lumpectomy  01/19/2010    right  . Mastectomy  03/31/2010    Right   Social History:   reports that she has never smoked. She does not have any smokeless tobacco history on file. She reports that she does not drink alcohol or use illicit drugs.  No family history on file.  Medications: Patient's Medications  New Prescriptions   No medications on file  Previous Medications   ACETAMINOPHEN (TYLENOL) 325 MG TABLET    Take 2 tablets (650 mg total) by mouth every 4 (four) hours as needed.   ALUM & MAG HYDROXIDE-SIMETH (MAALOX/MYLANTA) 200-200-20 MG/5ML SUSPENSION    Take 30 mLs by mouth every 6 (six) hours as needed for indigestion or heartburn.   ASPIRIN EC 81 MG TABLET    Take 81 mg by mouth daily.   AYR SALINE NASAL NO-DRIP GEL    Place into the nose 2 (two) times daily as needed (nose bleeds).   DOCUSATE SODIUM (COLACE) 100 MG CAPSULE    Take 200 mg by mouth at bedtime.    DORZOLAMIDE-TIMOLOL (COSOPT) 22.3-6.8 MG/ML OPHTHALMIC SOLUTION    Place 1  drop into the left eye every 12 (twelve) hours.   DULOXETINE (CYMBALTA) 30 MG CAPSULE    Take 30 mg by mouth 2 (two) times daily.   FENTANYL (DURAGESIC - DOSED MCG/HR) 100 MCG/HR    Apply one patch topically every 3 days for pain   FUROSEMIDE (LASIX) 40 MG TABLET    Take 40 mg by mouth 2 (two) times daily.    GLUCAGON (GLUCAGEN) 1 MG SOLR INJECTION    Inject 1 mg into the vein once as needed for low blood sugar.   GLUCOSAMINE-CHONDROITIN 500-400 MG TABLET    Take 1 tablet by mouth 3 (three) times daily.   GUAIFENESIN (ROBITUSSIN) 100 MG/5ML SYRUP  Take 300 mg by mouth 3 (three) times daily as needed for cough.   HYDROXYZINE (ATARAX/VISTARIL) 25 MG TABLET    Take 25 mg by mouth at bedtime as needed. For itching   INSULIN ASPART (NOVOLOG) 100 UNIT/ML INJECTION    Inject 8-12 Units into the skin 3 (three) times daily with meals. 8 units If CBG is greater than 150 at lunch and 12 units if CBG is greater than 150 at breakfast and dinner   INSULIN GLARGINE (LANTUS) 100 UNIT/ML INJECTION    Inject 16 Units into the skin at bedtime.    ISOSORBIDE MONONITRATE (IMDUR) 60 MG 24 HR TABLET    Take 60 mg by mouth every morning.    LEVOTHYROXINE (SYNTHROID, LEVOTHROID) 75 MCG TABLET    Take 75-112.5 mcg by mouth daily. Take 1 and 1/2 tablets on Saturdays then take 1 tablet all the other days   LORATADINE (CLARITIN) 10 MG TABLET    Take 10 mg by mouth daily.    LOSARTAN (COZAAR) 25 MG TABLET    Take 25 mg by mouth daily.   LUBIPROSTONE (AMITIZA) 24 MCG CAPSULE    Take 24 mcg by mouth 2 (two) times daily with a meal.     MEMANTINE HCL ER (NAMENDA XR) 28 MG CP24    Take by mouth.   METHYLCELLULOSE (ARTIFICIAL TEARS) 1 % OPHTHALMIC SOLUTION    Place 1 drop into both eyes 4 (four) times daily as needed (for dryness).   MORPHINE (ROXANOL) 20 MG/ML CONCENTRATED SOLUTION    Take 5 mg by mouth every 4 (four) hours as needed for severe pain.   MULTIPLE VITAMINS-MINERALS (CERTAVITE SENIOR/ANTIOXIDANT PO)    Take 1 tablet  by mouth daily.   NITROGLYCERIN (NITROSTAT) 0.4 MG SL TABLET    Place 0.4 mg under the tongue every 5 (five) minutes as needed. For chest pain   NUTRITIONAL SUPPLEMENTS (RESOURCE 2.0 PO)    Take 120 mLs by mouth 3 (three) times daily.   OMEPRAZOLE (PRILOSEC) 20 MG CAPSULE    Take 20 mg by mouth 2 (two) times daily.   PROBIOTIC PRODUCT (ALIGN) 4 MG CAPS    Take 1 capsule by mouth daily.   PROPRANOLOL (INDERAL) 20 MG TABLET    Take 20 mg by mouth 2 (two) times daily.   SPIRONOLACTONE (ALDACTONE) 25 MG TABLET    Take 25 mg by mouth daily.    VITAMIN D, ERGOCALCIFEROL, (DRISDOL) 50000 UNITS CAPS    Take 50,000 Units by mouth every 30 (thirty) days. On the first of the month  Modified Medications   No medications on file  Discontinued Medications   No medications on file     Physical Exam: Physical Exam  Constitutional: She appears well-developed.  HENT:  Head: Normocephalic and atraumatic.  Left Ear: External ear normal.  Eyes: Conjunctivae and EOM are normal. Pupils are equal, round, and reactive to light.  Neck: Neck supple. No JVD present. No thyromegaly present.  Cardiovascular: Normal rate and regular rhythm.   Murmur heard. Pulmonary/Chest: Effort normal. She has rales.  Bibasilar rales.   Abdominal: Soft. There is no tenderness. There is no rebound.  Musculoskeletal: She exhibits edema and tenderness.  Back pain is chronic which is well controlled. BLE chronic edema trace only. Left foot external deviated deformity.   Lymphadenopathy:    She has no cervical adenopathy.  Neurological: She is alert. No cranial nerve deficit. Coordination normal.  Skin: Skin is warm and dry. Rash noted.  Chronic venous dermatitis  in BLE. R+L Intergluteal cleft recurrent pressure ulcers in mirror image-much smaller.  Left forefoot diabetic ulcer--healed. Excoriated urogenital/buttocks     Psychiatric: Her mood appears anxious. Her affect is not blunt, not labile and not inappropriate. Her speech is  rapid and/or pressured (occasionally ). Her speech is not delayed and not slurred. She is hyperactive. She is not agitated, not aggressive, not slowed, not withdrawn, not actively hallucinating and not combative. Thought content is not paranoid and not delusional. She does not exhibit a depressed mood. She exhibits abnormal recent memory.  MMSE 21/30 08/2013   Filed Vitals:   01/27/15 1648  BP: 140/80  Pulse: 68  Temp: 98.4 F (36.9 C)  TempSrc: Tympanic  Resp: 18      Labs reviewed: Basic Metabolic Panel:  Recent Labs  03/06/14 04/20/14 07/03/14 10/01/14  NA 138 136* 137 136*  K 4.6 4.9 4.2 4.9  BUN 33* 30* 26* 35*  CREATININE 0.9 0.8 0.8 0.9  TSH 4.31  --  3.77  --    Liver Function Tests:  Recent Labs  03/06/14 04/20/14 10/01/14  AST 20 19 20   ALT 8 12 13   ALKPHOS 85 79 80   No results for input(s): LIPASE, AMYLASE in the last 8760 hours. No results for input(s): AMMONIA in the last 8760 hours. CBC:  Recent Labs  03/06/14 04/20/14 10/01/14  WBC 5.8 6.4 6.6  HGB 13.4 14.3 14.5  HCT 41 42 43  PLT 214 260 229   Lipid Panel: No results for input(s): CHOL, HDL, LDLCALC, TRIG, CHOLHDL, LDLDIRECT in the last 8760 hours.  Past Procedures:  10/22/13 CXR patchy interstitial changes left lower lung most consistent with pneumonia  11/21/13 CXR minimal cardiomegaly slightly increased without pulmonary vascular congestion or pleural effusion, no inflammatory consolidate or suspicious nodule, interval resolution of interstitial pneumonitis at the left lower lung.   01/29/14 for minor head injury and dementia: head CT w/o contrast: no acute findings.   04/18/14 X-ray L knee/ankle/tibia/fibula: no acute fracture or dislocation  07/18/14 X-ray L foot: the findings could be consistent with inflammatory process or osteomyelitis. Neuropathic arthropathy not excluded.   09/30/14 X-ray R rib: negative for appreciable right rib fracture   Assessment/Plan Type 2 diabetes mellitus  with diabetic neuropathy affecting both sides of body A1c 7.8 03/06/14 07/03/14 Hgb A1c 8.1 09/02/14 CBGs am 88-203, noon 118-285, pm 102-257 10/01/14 CBG 94 10/31/14 CBG am 149-228, [id number], pm 123-172-update Hgb A1c 11/03/14 Hgb A1c 8.3 01/23/15 Pharm: increase Lantus to 16u 2nd to morning hyperglycemia. Continue Novolog 8 u with meals(CBG>150) 01/26/15 Hgb A1c 9.2-increase Lantus to 20 u-am CBG 181-231, change Novolog to 5 u with meals.      Ischemic heart disease angina occasionally, f/u Cardiology, continue with Imdur 60mg  and Prn NTG(last dose 02/12/13) and ASA 81mg .       Hypothyroidism Corrected with Levothyroxine 112.57mcg, last TSH 2.069 08/26/13 and 4.306 03/06/14 and 3.773 07/03/14-update TSH   HTN (hypertension) Controlled, continue Losartan 25mg  daily, Atenolol, monitor blood pressure daily. Dr. Mare Ferrari 02/03/14 continue same cardiac meds.     Essential tremor Mild head titubation and hands tremor-not disabling. Takes Propranolol 10mg  bid     Edema Chronic venous insufficiency. Takes Furosemide 40mg  bid and Spironolctone25mg  daily. Trace edema BLE    Dyspepsia 07/29/14 decreased Omeprazole to 20mg  daily-consultant pharmacist stable.      Depression Better with Cymbalta 30mg  bid and Namenda. Observe the patient.   06/24/14 dc Ativan: infrequently use(1 dose in 3 months)  mood  is stable.     Dementia with behavioral disturbance Her MMSE 21/3010/04/2013. Given her hx of cardiovascular issues--vascular dementia is more likely. Tolerated  Namenda and increased Cymbalta to 30mg  bid. Mood is better controlled. Gradual decline in her cognition noted. Lack of safety awareness contributed to her frequent falling.     Constipation Stable, takes colace 100 II qhs and Linzess(off Amitiza 84mcg bid-therapeutic exchange by Pharm )and MiraLax prn.    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 Propranolol was increased to 20mg  bid for better rate control in setting of HTN and essential tremor. Saw Dr. Mare Ferrari 02/03/14 doing well, EKG A-fib with controlled VR. 1year office visit EKG     Family/ Staff Communication: observe the patient.   Goals of Care: SNF  Labs/tests ordered: none

## 2015-01-27 NOTE — Assessment & Plan Note (Signed)
Chronic-coccyalgia in nature, pain is controlled with Fentanyl 160mcg/hr

## 2015-01-27 NOTE — Assessment & Plan Note (Signed)
Rate controlled. Propranolol was increased to 20mg  bid for better rate control in setting of HTN and essential tremor. Saw Dr. Mare Ferrari 02/03/14 doing well, EKG A-fib with controlled VR. 1year office visit EKG

## 2015-01-27 NOTE — Assessment & Plan Note (Signed)
Mild head titubation and hands tremor-not disabling. Takes Propranolol 10mg  bid

## 2015-01-27 NOTE — Assessment & Plan Note (Signed)
Her MMSE 21/3010/04/2013. Given her hx of cardiovascular issues--vascular dementia is more likely. Tolerated  Namenda and increased Cymbalta to 30mg  bid. Mood is better controlled. Gradual decline in her cognition noted. Lack of safety awareness contributed to her frequent falling.

## 2015-01-27 NOTE — Assessment & Plan Note (Signed)
angina occasionally, f/u Cardiology, continue with Imdur 60mg  and Prn NTG(last dose 02/12/13) and ASA 81mg .

## 2015-01-27 NOTE — Assessment & Plan Note (Signed)
Controlled, continue Losartan 25mg  daily, Atenolol, monitor blood pressure daily. Dr. Mare Ferrari 02/03/14 continue same cardiac meds.

## 2015-01-27 NOTE — Assessment & Plan Note (Signed)
Stable, takes colace 100 II qhs and Linzess(off Amitiza 57mcg bid-therapeutic exchange by Pharm )and MiraLax prn.

## 2015-01-27 NOTE — Assessment & Plan Note (Signed)
07/29/14 decreased Omeprazole to 20mg  daily-consultant pharmacist stable.

## 2015-01-27 NOTE — Assessment & Plan Note (Signed)
Better with Cymbalta 30mg  bid and Namenda. Observe the patient.   06/24/14 dc Ativan: infrequently use(1 dose in 3 months)  mood is stable.

## 2015-01-27 NOTE — Assessment & Plan Note (Signed)
Chronic venous insufficiency. Takes Furosemide 40mg  bid and Spironolctone25mg  daily. Trace edema BLE

## 2015-01-29 LAB — TSH: TSH: 3.01 u[IU]/mL (ref 0.41–5.90)

## 2015-01-30 ENCOUNTER — Other Ambulatory Visit: Payer: Self-pay | Admitting: Nurse Practitioner

## 2015-01-30 DIAGNOSIS — E039 Hypothyroidism, unspecified: Secondary | ICD-10-CM

## 2015-02-05 ENCOUNTER — Non-Acute Institutional Stay (SKILLED_NURSING_FACILITY): Payer: Medicare Other | Admitting: Internal Medicine

## 2015-02-05 DIAGNOSIS — F0391 Unspecified dementia with behavioral disturbance: Secondary | ICD-10-CM

## 2015-02-05 DIAGNOSIS — I10 Essential (primary) hypertension: Secondary | ICD-10-CM

## 2015-02-05 DIAGNOSIS — E1142 Type 2 diabetes mellitus with diabetic polyneuropathy: Secondary | ICD-10-CM

## 2015-02-05 DIAGNOSIS — E039 Hypothyroidism, unspecified: Secondary | ICD-10-CM | POA: Diagnosis not present

## 2015-02-05 DIAGNOSIS — M5440 Lumbago with sciatica, unspecified side: Secondary | ICD-10-CM

## 2015-02-05 DIAGNOSIS — R609 Edema, unspecified: Secondary | ICD-10-CM | POA: Diagnosis not present

## 2015-02-05 DIAGNOSIS — F03918 Unspecified dementia, unspecified severity, with other behavioral disturbance: Secondary | ICD-10-CM

## 2015-02-05 NOTE — Progress Notes (Signed)
Patient ID: [name], female   DOB: [birth year], 81 y.o.   MRN: [id number]    Pender Room Number: N 2  Place of Service: SNF (31)    Allergies  Allergen Reactions  . Penicillins Other (See Comments)    Per MAR  . Bee Venom Other (See Comments)    Per MAR  . Celebrex [Celecoxib] Other (See Comments)    Per MAR  . Lidocaine Hcl Other (See Comments)    Per MAR  . Phenobarbital Other (See Comments)    Per MAR  . Procaine Hcl Other (See Comments)    Per mar    Chief Complaint  Patient presents with  . Medical Management of Chronic Issues    HPI:  Type 2 diabetes mellitus with diabetic neuropathy affecting both sides of body: Controlled latest hemoglobin A1c was 9.2. Poor dietary compliance.  Midline low back pain with sciatica, sciatica laterality unspecified: Chronic condition. Patient says she continue with pain that is currently present.  Dementia with behavioral disturbance: Progressive and most likely Alzheimer's disease  Edema: Unchanged  Essential hypertension: Controlled  Hypothyroidism, unspecified hypothyroidism type: Compensated    Medications: Patient's Medications  New Prescriptions   No medications on file  Previous Medications   ACETAMINOPHEN (TYLENOL) 325 MG TABLET    Take 2 tablets (650 mg total) by mouth every 4 (four) hours as needed.   ALUM & MAG HYDROXIDE-SIMETH (MAALOX/MYLANTA) 200-200-20 MG/5ML SUSPENSION    Take 30 mLs by mouth every 6 (six) hours as needed for indigestion or heartburn.   ASPIRIN EC 81 MG TABLET    Take 81 mg by mouth daily.   AYR SALINE NASAL NO-DRIP GEL    Place into the nose 2 (two) times daily as needed (nose bleeds).   DOCUSATE SODIUM (COLACE) 100 MG CAPSULE    Take 200 mg by mouth at bedtime.    DORZOLAMIDE-TIMOLOL (COSOPT) 22.3-6.8 MG/ML OPHTHALMIC SOLUTION    Place 1 drop into the left eye every 12 (twelve) hours.   DULOXETINE (CYMBALTA) 30 MG CAPSULE    Take 30 mg by mouth 2 (two) times  daily.   FENTANYL (DURAGESIC - DOSED MCG/HR) 100 MCG/HR    Apply one patch topically every 3 days for pain   FUROSEMIDE (LASIX) 40 MG TABLET    Take 40 mg by mouth 2 (two) times daily.    GLUCAGON (GLUCAGEN) 1 MG SOLR INJECTION    Inject 1 mg into the vein once as needed for low blood sugar.   GLUCOSAMINE-CHONDROITIN 500-400 MG TABLET    Take 1 tablet by mouth 3 (three) times daily.   GUAIFENESIN (ROBITUSSIN) 100 MG/5ML SYRUP    Take 300 mg by mouth 3 (three) times daily as needed for cough.   HYDROXYZINE (ATARAX/VISTARIL) 25 MG TABLET    Take 25 mg by mouth at bedtime as needed. For itching   INSULIN ASPART (NOVOLOG) 100 UNIT/ML INJECTION    Inject 8-12 Units into the skin 3 (three) times daily with meals. 8 units If CBG is greater than 150 at lunch and 12 units if CBG is greater than 150 at breakfast and dinner   INSULIN GLARGINE (LANTUS) 100 UNIT/ML INJECTION    Inject 16 Units into the skin at bedtime.    ISOSORBIDE MONONITRATE (IMDUR) 60 MG 24 HR TABLET    Take 60 mg by mouth every morning.    LEVOTHYROXINE (SYNTHROID, LEVOTHROID) 75 MCG TABLET    Take 75-112.5 mcg by mouth daily. Take  1 and 1/2 tablets on Saturdays then take 1 tablet all the other days   LORATADINE (CLARITIN) 10 MG TABLET    Take 10 mg by mouth daily.    LOSARTAN (COZAAR) 25 MG TABLET    Take 25 mg by mouth daily.   LUBIPROSTONE (AMITIZA) 24 MCG CAPSULE    Take 24 mcg by mouth 2 (two) times daily with a meal.     MEMANTINE HCL ER (NAMENDA XR) 28 MG CP24    Take by mouth.   METHYLCELLULOSE (ARTIFICIAL TEARS) 1 % OPHTHALMIC SOLUTION    Place 1 drop into both eyes 4 (four) times daily as needed (for dryness).   MORPHINE (ROXANOL) 20 MG/ML CONCENTRATED SOLUTION    Take 5 mg by mouth every 4 (four) hours as needed for severe pain.   MULTIPLE VITAMINS-MINERALS (CERTAVITE SENIOR/ANTIOXIDANT PO)    Take 1 tablet by mouth daily.   NITROGLYCERIN (NITROSTAT) 0.4 MG SL TABLET    Place 0.4 mg under the tongue every 5 (five) minutes as  needed. For chest pain   NUTRITIONAL SUPPLEMENTS (RESOURCE 2.0 PO)    Take 120 mLs by mouth 3 (three) times daily.   OMEPRAZOLE (PRILOSEC) 20 MG CAPSULE    Take 20 mg by mouth 2 (two) times daily.   PROBIOTIC PRODUCT (ALIGN) 4 MG CAPS    Take 1 capsule by mouth daily.   PROPRANOLOL (INDERAL) 20 MG TABLET    Take 20 mg by mouth 2 (two) times daily.   SPIRONOLACTONE (ALDACTONE) 25 MG TABLET    Take 25 mg by mouth daily.    VITAMIN D, ERGOCALCIFEROL, (DRISDOL) 50000 UNITS CAPS    Take 50,000 Units by mouth every 30 (thirty) days. On the first of the month  Modified Medications   No medications on file  Discontinued Medications   No medications on file     Review of Systems  Constitutional: Negative for fever, chills, diaphoresis, activity change, appetite change, fatigue and unexpected weight change.       Overweight. Sedentary.  HENT: Positive for hearing loss. Negative for congestion, ear discharge, ear pain, postnasal drip, rhinorrhea, sore throat, tinnitus, trouble swallowing and voice change.   Eyes: Negative for pain, redness, itching and visual disturbance.       Corrective lenses.  Respiratory: Negative for cough, choking, shortness of breath and wheezing.   Cardiovascular: Negative for chest pain, palpitations and leg swelling.  Gastrointestinal: Negative for nausea, abdominal pain, diarrhea, constipation and abdominal distention.  Endocrine: Negative for cold intolerance, heat intolerance, polydipsia, polyphagia and polyuria.       Diabetic with circulatory and neurologic complications.  Genitourinary: Negative for dysuria, urgency, frequency, hematuria, flank pain, vaginal discharge, difficulty urinating and pelvic pain.  Musculoskeletal: Negative for myalgias, back pain, arthralgias, gait problem, neck pain and neck stiffness.  Skin: Negative for color change, pallor and rash.       Dry skin. History of chronic venous stasis changes of the lower legs bilaterally. Venous  insufficiency.  Allergic/Immunologic: Negative.   Neurological: Negative for dizziness, tremors, seizures, syncope, weakness, numbness and headaches.       Long-standing history of peripheral neuropathy.  Hematological: Negative for adenopathy. Does not bruise/bleed easily.  Psychiatric/Behavioral: Negative for suicidal ideas, hallucinations, behavioral problems, confusion, sleep disturbance, dysphoric mood and agitation. The patient is not nervous/anxious and is not hyperactive.     Filed Vitals:   02/05/15 1419  BP: 134/72  Pulse: 79  Temp: 97.8 F (36.6 C)  Resp: 20  Height:  5\' 3"  (1.6 m)  Weight: 184 lb 3.2 oz (83.553 kg)   Body mass index is 32.64 kg/(m^2).  Physical Exam  Constitutional: She appears well-developed.  Obese.  HENT:  Head: Normocephalic and atraumatic.  Left Ear: External ear normal.  Eyes: Conjunctivae and EOM are normal. Pupils are equal, round, and reactive to light.  Neck: Neck supple. No JVD present. No thyromegaly present.  Cardiovascular: Normal rate and regular rhythm.   Murmur heard. Unable to palpate dorsalis pedis or posterior tibial artery pulsations bilaterally  Pulmonary/Chest: Effort normal. She has rales.  Bibasilar rales.   Abdominal: Soft. There is no tenderness. There is no rebound.  Musculoskeletal: She exhibits edema and tenderness.  Back pain is chronic which is well controlled. BLE chronic edema 1+. Left mid plantar aspect foot pain-worsens with weight bearing. Left foot external deviated deformity.  Lymphadenopathy:    She has no cervical adenopathy.  Neurological: She is alert. No cranial nerve deficit. Coordination normal.  Insensitive to vibratory testing and monofilament testing  Skin: Skin is warm and dry. Rash noted.  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 R+L Intergluteal cleft recurrent pressure ulcers in mirror image-much smaller.  Left forefoot diabetic ulcer--healed. Excoriated urogenital/buttocks/perinum is chronic. New  posterior flank clustered rash-painful and small amount drainage seen in the lower part in the skin fold(doesn't present bruise from traumatic falling-but the patient insisted it started hurting after her fall this am).   Psychiatric: Her mood appears anxious. Her affect is not blunt, not labile and not inappropriate. Her speech is rapid and/or pressured (occasionally ). Her speech is not delayed and not slurred. She is not agitated, not aggressive, not hyperactive, not slowed, not withdrawn, not actively hallucinating and not combative. Thought content is not paranoid and not delusional. She does not exhibit a depressed mood. She exhibits abnormal recent memory.  MMSE 21/30 08/2013     Labs reviewed: Lab on 01/30/2015  Component Date Value Ref Range Status  . TSH 01/29/2015 3.01  0.41 - 5.90 uIU/mL Final  Nursing Home on 01/27/2015  Component Date Value Ref Range Status  . Hgb A1c MFr Bld 01/26/2015 9.2* 4.0 - 6.0 % Final     Assessment/Plan  1. Type 2 diabetes mellitus with diabetic neuropathy affecting both sides of body Lantus was recently increased to 20 units daily. NovoLog is now 5 units with meals if CBG greater than  2. Midline low back pain with sciatica, sciatica laterality unspecified Using fentanyl patch 100 g per hour  3. Dementia with behavioral disturbance Unchanged  4. Edema Chronic and approximately 2+ bilaterally  5. Essential hypertension Controlled  6. Hypothyroidism, unspecified hypothyroidism type Compensated

## 2015-02-17 ENCOUNTER — Non-Acute Institutional Stay (SKILLED_NURSING_FACILITY): Payer: Medicare Other | Admitting: Nurse Practitioner

## 2015-02-17 ENCOUNTER — Encounter: Payer: Self-pay | Admitting: Nurse Practitioner

## 2015-02-17 DIAGNOSIS — I872 Venous insufficiency (chronic) (peripheral): Secondary | ICD-10-CM

## 2015-02-17 DIAGNOSIS — R1013 Epigastric pain: Secondary | ICD-10-CM

## 2015-02-17 DIAGNOSIS — K5900 Constipation, unspecified: Secondary | ICD-10-CM | POA: Diagnosis not present

## 2015-02-17 DIAGNOSIS — E1142 Type 2 diabetes mellitus with diabetic polyneuropathy: Secondary | ICD-10-CM | POA: Diagnosis not present

## 2015-02-17 DIAGNOSIS — I10 Essential (primary) hypertension: Secondary | ICD-10-CM

## 2015-02-17 DIAGNOSIS — E039 Hypothyroidism, unspecified: Secondary | ICD-10-CM | POA: Diagnosis not present

## 2015-02-17 DIAGNOSIS — R609 Edema, unspecified: Secondary | ICD-10-CM

## 2015-02-17 DIAGNOSIS — F0391 Unspecified dementia with behavioral disturbance: Secondary | ICD-10-CM

## 2015-02-17 DIAGNOSIS — F329 Major depressive disorder, single episode, unspecified: Secondary | ICD-10-CM

## 2015-02-17 DIAGNOSIS — I8311 Varicose veins of right lower extremity with inflammation: Secondary | ICD-10-CM | POA: Diagnosis not present

## 2015-02-17 DIAGNOSIS — I8312 Varicose veins of left lower extremity with inflammation: Secondary | ICD-10-CM

## 2015-02-17 DIAGNOSIS — M5440 Lumbago with sciatica, unspecified side: Secondary | ICD-10-CM | POA: Diagnosis not present

## 2015-02-17 DIAGNOSIS — F32A Depression, unspecified: Secondary | ICD-10-CM

## 2015-02-17 DIAGNOSIS — F03918 Unspecified dementia, unspecified severity, with other behavioral disturbance: Secondary | ICD-10-CM

## 2015-02-17 DIAGNOSIS — I259 Chronic ischemic heart disease, unspecified: Secondary | ICD-10-CM | POA: Diagnosis not present

## 2015-02-17 NOTE — Assessment & Plan Note (Signed)
02/16/15 am fasting CBG 72, 64, 58-decrease Lantus to 18units daily continue Novolog 5 u with meals for CBG>150

## 2015-02-17 NOTE — Assessment & Plan Note (Signed)
Gradual decline but adjusted to SNF well. Takes Namenda 28mg  daily.

## 2015-02-17 NOTE — Assessment & Plan Note (Signed)
angina occasionally, f/u Cardiology, continue with Imdur 60mg  and Prn NTG(last dose 02/12/13) and ASA 81mg .

## 2015-02-17 NOTE — Assessment & Plan Note (Signed)
Stable, takes colace 100 II qhs and Linzess 129mcg (off Amitiza 45mcg bid-therapeutic exchange by Pharm )and MiraLax prn.

## 2015-02-17 NOTE — Assessment & Plan Note (Signed)
Controlled, continue Losartan 25mg  daily, Atenolol 20mg  bid, monitor blood pressure daily. Dr. Mare Ferrari 02/03/14 continue same cardiac meds.

## 2015-02-17 NOTE — Progress Notes (Signed)
Patient ID: [name], female   DOB: [birth year], 81 y.o.   MRN: [id number]   Code Status: DNR  Allergies  Allergen Reactions  . Penicillins Other (See Comments)    Per MAR  . Bee Venom Other (See Comments)    Per MAR  . Celebrex [Celecoxib] Other (See Comments)    Per MAR  . Lidocaine Hcl Other (See Comments)    Per MAR  . Phenobarbital Other (See Comments)    Per MAR  . Procaine Hcl Other (See Comments)    Per mar    Chief Complaint  Patient presents with  . Medical Management of Chronic Issues  . Acute Visit    blood sugar    HPI: Patient is a 81 y.o. female seen in the SNF at [facility] today for evaluation of low blood sugar and other chronic medical conditions.  Problem List Items Addressed This Visit    Type 2 diabetes mellitus with diabetic neuropathy affecting both sides of body - Primary (Chronic)    02/16/15 am fasting CBG 72, 64, 58-decrease Lantus to 18units daily continue Novolog 5 u with meals for CBG>150        Ischemic heart disease    angina occasionally, f/u Cardiology, continue with Imdur 60mg  and Prn NTG(last dose 02/12/13) and ASA 81mg .          Dyspepsia    07/29/14 decreased Omeprazole to 20mg  daily-consultant pharmacist stable.      Hypothyroidism    Corrected with Levothyroxine 112.53mcg. 07/03/14 TSH 3.773. 01/29/15 TSH 3.011        Depression    Better with Cymbalta 30mg  bid and Namenda. Observe the patient.   06/24/14 dc Ativan: infrequently use(1 dose in 3 months)  mood is stable.       Back pain    Chronic-coccyalgia in nature, pain is controlled with Fentanyl 166mcg/hr        HTN (hypertension)    Controlled, continue Losartan 25mg  daily, Atenolol 20mg  bid, monitor blood pressure daily. Dr. Mare Ferrari 02/03/14 continue same cardiac meds.          Constipation    Stable, takes colace 100 II qhs and Linzess 158mcg (off Amitiza 73mcg bid-therapeutic exchange by Pharm )and MiraLax prn.        Edema    Chronic venous  insufficiency. Takes Furosemide 40mg  bid and Spironolctone25mg  daily. Trace edema BLE        Dementia with behavioral disturbance    Gradual decline but adjusted to SNF well. Takes Namenda 28mg  daily.       Venous stasis dermatitis of both lower extremities    Chronic but stable, currently no open wounds or s/s of infection. Takes Claritin for itching         Review of Systems:  Review of Systems  Constitutional: Negative for fever, chills and diaphoresis.  HENT: Positive for hearing loss. Negative for congestion, ear discharge, ear pain, nosebleeds, sore throat and tinnitus.   Eyes: Negative for photophobia, pain, discharge and redness.  Respiratory: Negative for cough, shortness of breath, wheezing and stridor.   Cardiovascular: Positive for leg swelling. Negative for chest pain and palpitations.  Gastrointestinal: Negative for nausea, vomiting, abdominal pain, diarrhea, constipation and blood in stool.  Endocrine: Negative for polydipsia.  Genitourinary: Positive for frequency. Negative for dysuria, urgency, hematuria and flank pain.  Musculoskeletal: Positive for back pain. Negative for myalgias and neck pain.  Skin: Positive for rash.  Allergic/Immunologic: Negative for environmental allergies.  Neurological: Negative for  dizziness, tremors, seizures, weakness and headaches.  Hematological: Does not bruise/bleed easily.  Psychiatric/Behavioral: Negative for suicidal ideas and hallucinations. The patient is nervous/anxious.      Past Medical History  Diagnosis Date  . Diabetes mellitus   . Hypertension   . Heart murmur   . Benign familial tremor   . Rosacea   . Diverticulitis   . Heart failure     diastolic heart failure  . TIA (transient ischemic attack) 2010  . Arthritis   . Breast cancer     breast -rt  . CAD (coronary artery disease)     single vessel CAD per cath in 2006; scattered nonobstructive disease in the left system, left to right collaterals to the  RCA which was occluded by flush shots; she has been managed medically   Past Surgical History  Procedure Laterality Date  . Mastectomy partial / lumpectomy  11/02/2000    Right, with SLN  . Mastectomy partial / lumpectomy  05/15/2008    Left  . Breast lumpectomy  01/19/2010    right  . Mastectomy  03/31/2010    Right   Social History:   reports that she has never smoked. She does not have any smokeless tobacco history on file. She reports that she does not drink alcohol or use illicit drugs.  No family history on file.  Medications: Patient's Medications  New Prescriptions   No medications on file  Previous Medications   ACETAMINOPHEN (TYLENOL) 325 MG TABLET    Take 2 tablets (650 mg total) by mouth every 4 (four) hours as needed.   ALUM & MAG HYDROXIDE-SIMETH (MAALOX/MYLANTA) 200-200-20 MG/5ML SUSPENSION    Take 30 mLs by mouth every 6 (six) hours as needed for indigestion or heartburn.   ASPIRIN EC 81 MG TABLET    Take 81 mg by mouth daily.   AYR SALINE NASAL NO-DRIP GEL    Place into the nose 2 (two) times daily as needed (nose bleeds).   DOCUSATE SODIUM (COLACE) 100 MG CAPSULE    Take 200 mg by mouth at bedtime.    DORZOLAMIDE-TIMOLOL (COSOPT) 22.3-6.8 MG/ML OPHTHALMIC SOLUTION    Place 1 drop into the left eye every 12 (twelve) hours.   DULOXETINE (CYMBALTA) 30 MG CAPSULE    Take 30 mg by mouth 2 (two) times daily.   FENTANYL (DURAGESIC - DOSED MCG/HR) 100 MCG/HR    Apply one patch topically every 3 days for pain   FUROSEMIDE (LASIX) 40 MG TABLET    Take 40 mg by mouth 2 (two) times daily.    GLUCAGON (GLUCAGEN) 1 MG SOLR INJECTION    Inject 1 mg into the vein once as needed for low blood sugar.   GLUCOSAMINE-CHONDROITIN 500-400 MG TABLET    Take 1 tablet by mouth 3 (three) times daily.   GUAIFENESIN (ROBITUSSIN) 100 MG/5ML SYRUP    Take 300 mg by mouth 3 (three) times daily as needed for cough.   HYDROXYZINE (ATARAX/VISTARIL) 25 MG TABLET    Take 25 mg by mouth at bedtime as  needed. For itching   INSULIN ASPART (NOVOLOG) 100 UNIT/ML INJECTION    Inject 8-12 Units into the skin 3 (three) times daily with meals. 8 units If CBG is greater than 150 at lunch and 12 units if CBG is greater than 150 at breakfast and dinner   INSULIN GLARGINE (LANTUS) 100 UNIT/ML INJECTION    Inject 16 Units into the skin at bedtime.    ISOSORBIDE MONONITRATE (IMDUR) 60 MG 24  HR TABLET    Take 60 mg by mouth every morning.    LEVOTHYROXINE (SYNTHROID, LEVOTHROID) 75 MCG TABLET    Take 75-112.5 mcg by mouth daily. Take 1 and 1/2 tablets on Saturdays then take 1 tablet all the other days   LORATADINE (CLARITIN) 10 MG TABLET    Take 10 mg by mouth daily.    LOSARTAN (COZAAR) 25 MG TABLET    Take 25 mg by mouth daily.   LUBIPROSTONE (AMITIZA) 24 MCG CAPSULE    Take 24 mcg by mouth 2 (two) times daily with a meal.     MEMANTINE HCL ER (NAMENDA XR) 28 MG CP24    Take by mouth.   METHYLCELLULOSE (ARTIFICIAL TEARS) 1 % OPHTHALMIC SOLUTION    Place 1 drop into both eyes 4 (four) times daily as needed (for dryness).   MORPHINE (ROXANOL) 20 MG/ML CONCENTRATED SOLUTION    Take 5 mg by mouth every 4 (four) hours as needed for severe pain.   MULTIPLE VITAMINS-MINERALS (CERTAVITE SENIOR/ANTIOXIDANT PO)    Take 1 tablet by mouth daily.   NITROGLYCERIN (NITROSTAT) 0.4 MG SL TABLET    Place 0.4 mg under the tongue every 5 (five) minutes as needed. For chest pain   NUTRITIONAL SUPPLEMENTS (RESOURCE 2.0 PO)    Take 120 mLs by mouth 3 (three) times daily.   OMEPRAZOLE (PRILOSEC) 20 MG CAPSULE    Take 20 mg by mouth 2 (two) times daily.   PROBIOTIC PRODUCT (ALIGN) 4 MG CAPS    Take 1 capsule by mouth daily.   PROPRANOLOL (INDERAL) 20 MG TABLET    Take 20 mg by mouth 2 (two) times daily.   SPIRONOLACTONE (ALDACTONE) 25 MG TABLET    Take 25 mg by mouth daily.    VITAMIN D, ERGOCALCIFEROL, (DRISDOL) 50000 UNITS CAPS    Take 50,000 Units by mouth every 30 (thirty) days. On the first of the month  Modified Medications     No medications on file  Discontinued Medications   No medications on file     Physical Exam: Physical Exam  Constitutional: She appears well-developed.  HENT:  Head: Normocephalic and atraumatic.  Left Ear: External ear normal.  Eyes: Conjunctivae and EOM are normal. Pupils are equal, round, and reactive to light.  Neck: Neck supple. No JVD present. No thyromegaly present.  Cardiovascular: Normal rate and regular rhythm.   Murmur heard. Pulmonary/Chest: Effort normal. She has rales.  Bibasilar rales.   Abdominal: Soft. There is no tenderness. There is no rebound.  Musculoskeletal: She exhibits edema and tenderness.  Back pain is chronic which is well controlled. BLE chronic edema trace only. Left foot external deviated deformity.   Lymphadenopathy:    She has no cervical adenopathy.  Neurological: She is alert. No cranial nerve deficit. Coordination normal.  Skin: Skin is warm and dry. Rash noted.  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 R+L Intergluteal cleft recurrent pressure ulcers in mirror image-much smaller.  Left forefoot diabetic ulcer--healed. Excoriated urogenital/buttocks     Psychiatric: Her mood appears anxious. Her affect is not blunt, not labile and not inappropriate. Her speech is rapid and/or pressured (occasionally ). Her speech is not delayed and not slurred. She is hyperactive. She is not agitated, not aggressive, not slowed, not withdrawn, not actively hallucinating and not combative. Thought content is not paranoid and not delusional. She does not exhibit a depressed mood. She exhibits abnormal recent memory.  MMSE 21/30 08/2013   Filed Vitals:   02/17/15 1034  BP:  142/83  Pulse: 64  Temp: 97.1 F (36.2 C)  TempSrc: Tympanic  Resp: 18      Labs reviewed: Basic Metabolic Panel:  Recent Labs  03/06/14 04/20/14 07/03/14 10/01/14 01/29/15  NA 138 136* 137 136*  --   K 4.6 4.9 4.2 4.9  --   BUN 33* 30* 26* 35*  --   CREATININE 0.9 0.8 0.8 0.9  --    TSH 4.31  --  3.77  --  3.01   Liver Function Tests:  Recent Labs  03/06/14 04/20/14 10/01/14  AST 20 19 20   ALT 8 12 13   ALKPHOS 85 79 80   No results for input(s): LIPASE, AMYLASE in the last 8760 hours. No results for input(s): AMMONIA in the last 8760 hours. CBC:  Recent Labs  03/06/14 04/20/14 10/01/14  WBC 5.8 6.4 6.6  HGB 13.4 14.3 14.5  HCT 41 42 43  PLT 214 260 229   Lipid Panel: No results for input(s): CHOL, HDL, LDLCALC, TRIG, CHOLHDL, LDLDIRECT in the last 8760 hours.  Past Procedures:  10/22/13 CXR patchy interstitial changes left lower lung most consistent with pneumonia  11/21/13 CXR minimal cardiomegaly slightly increased without pulmonary vascular congestion or pleural effusion, no inflammatory consolidate or suspicious nodule, interval resolution of interstitial pneumonitis at the left lower lung.   01/29/14 for minor head injury and dementia: head CT w/o contrast: no acute findings.   04/18/14 X-ray L knee/ankle/tibia/fibula: no acute fracture or dislocation  07/18/14 X-ray L foot: the findings could be consistent with inflammatory process or osteomyelitis. Neuropathic arthropathy not excluded.   09/30/14 X-ray R rib: negative for appreciable right rib fracture   Assessment/Plan Type 2 diabetes mellitus with diabetic neuropathy affecting both sides of body 02/16/15 am fasting CBG 72, 64, 58-decrease Lantus to 18units daily continue Novolog 5 u with meals for CBG>150     Ischemic heart disease angina occasionally, f/u Cardiology, continue with Imdur 60mg  and Prn NTG(last dose 02/12/13) and ASA 81mg .       Hypothyroidism Corrected with Levothyroxine 112.34mcg. 07/03/14 TSH 3.773. 01/29/15 TSH 3.011     Back pain Chronic-coccyalgia in nature, pain is controlled with Fentanyl 129mcg/hr     Constipation Stable, takes colace 100 II qhs and Linzess 113mcg (off Amitiza 77mcg bid-therapeutic exchange by Pharm )and MiraLax prn.     Dyspepsia 07/29/14  decreased Omeprazole to 20mg  daily-consultant pharmacist stable.   Venous stasis dermatitis of both lower extremities Chronic but stable, currently no open wounds or s/s of infection. Takes Claritin for itching   HTN (hypertension) Controlled, continue Losartan 25mg  daily, Atenolol 20mg  bid, monitor blood pressure daily. Dr. Mare Ferrari 02/03/14 continue same cardiac meds.       Dementia with behavioral disturbance Gradual decline but adjusted to SNF well. Takes Namenda 28mg  daily.    Edema Chronic venous insufficiency. Takes Furosemide 40mg  bid and Spironolctone25mg  daily. Trace edema BLE     Depression Better with Cymbalta 30mg  bid and Namenda. Observe the patient.   06/24/14 dc Ativan: infrequently use(1 dose in 3 months)  mood is stable.      Family/ Staff Communication: observe the patient.   Goals of Care: SNF  Labs/tests ordered: none

## 2015-02-17 NOTE — Assessment & Plan Note (Signed)
07/29/14 decreased Omeprazole to 20mg  daily-consultant pharmacist stable.

## 2015-02-17 NOTE — Assessment & Plan Note (Signed)
Chronic-coccyalgia in nature, pain is controlled with Fentanyl 171mcg/hr

## 2015-02-17 NOTE — Assessment & Plan Note (Signed)
Chronic venous insufficiency. Takes Furosemide 40mg  bid and Spironolctone25mg  daily. Trace edema BLE

## 2015-02-17 NOTE — Assessment & Plan Note (Signed)
Chronic but stable, currently no open wounds or s/s of infection. Takes Claritin for itching

## 2015-02-17 NOTE — Assessment & Plan Note (Signed)
Corrected with Levothyroxine 112.43mcg. 07/03/14 TSH 3.773. 01/29/15 TSH 3.011

## 2015-02-17 NOTE — Assessment & Plan Note (Signed)
Better with Cymbalta 30mg  bid and Namenda. Observe the patient.   06/24/14 dc Ativan: infrequently use(1 dose in 3 months)  mood is stable.

## 2015-02-20 ENCOUNTER — Ambulatory Visit (INDEPENDENT_AMBULATORY_CARE_PROVIDER_SITE_OTHER): Payer: Medicare Other | Admitting: Cardiology

## 2015-02-20 VITALS — BP 120/70 | HR 73 | Ht 66.0 in | Wt 190.8 lb

## 2015-02-20 DIAGNOSIS — I480 Paroxysmal atrial fibrillation: Secondary | ICD-10-CM | POA: Diagnosis not present

## 2015-02-20 DIAGNOSIS — I259 Chronic ischemic heart disease, unspecified: Secondary | ICD-10-CM

## 2015-02-20 NOTE — Progress Notes (Signed)
Cardiology Office Note   Date:  02/20/2015   ID:  ANNMARGARET DECAPRIO, DOB 19-Mar-1926, MRN [id number]  PCP: Dr. Elder Negus Cardiologist:   Darlin Coco, MD   No chief complaint on file.     History of Present Illness: [name] is a 81 y.o. female who presents for a one-year follow-up office visit  [name] is seen back today for a  followup office visit. She is a former patient of Dr. Janyth Pupa. Her PCP is Dr. Jeanmarie Hubert at [facility]. She goes by the first name "[name]". She has a history of chronic pain, GERD, hypothyroidism, HTN, HLD, CKD, anxiety, OA, breast cancer and single vessel CAD per cath back in 2006. She had a nuclear stress test back in April of 2011 showing normal EF. Her perfusion imaging was abnormal but correlates to her known occluded RCA. She has been managed medically. Last echo was about 3 years ago with results as noted below. . Review of her records from [facility] show that she has had issues with pressure ulcers and general aging concerns. The patient now lives in the health care skilled nursing facility section at [facility]. Her husband lives in the assisted living section. They have lunch and supper together. Since last visit she had a fall a month ago at the nursing home after she tripped. She did not have syncope. This occurred on 01/29/2014. She suffered a minor abrasion to her scalp. She went to the emergency room where a CT of the head showed atrophy but no acute damage from the fall.  Since last visit her dementia has become worse.  She is disoriented to time and place. The patient previously had been on Coumadin but now is on just a baby aspirin for anticoagulation. She still has frequent nosebleeds. She has painful arthritis and is on fentanyl patch  Since we last saw her [facility] after having an episode of syncope at home. She was found to be in new atrial flutter fibrillation. She is not a candidate  for anticoagulation. She declined hospitalization. Her beta blocker was increased at that emergency room visit up to propranolol 20 mg twice a day. He returns now for followup. She states that she has been feeling well and has had no further episodes of dizziness or syncope. Her last echocardiogram was 08/14/12 and showed an ejection fraction of 60-65% with grade 2 diastolic dysfunction and no significant valve abnormalities. Since last visit she has been having no new cardiac concerns.  Past Medical History  Diagnosis Date  . Diabetes mellitus   . Hypertension   . Heart murmur   . Benign familial tremor   . Rosacea   . Diverticulitis   . Heart failure     diastolic heart failure  . TIA (transient ischemic attack) 2010  . Arthritis   . Breast cancer     breast -rt  . CAD (coronary artery disease)     single vessel CAD per cath in 2006; scattered nonobstructive disease in the left system, left to right collaterals to the RCA which was occluded by flush shots; she has been managed medically    Past Surgical History  Procedure Laterality Date  . Mastectomy partial / lumpectomy  11/02/2000    Right, with SLN  . Mastectomy partial / lumpectomy  05/15/2008    Left  . Breast lumpectomy  01/19/2010    right  . Mastectomy  03/31/2010  Right     Current Outpatient Prescriptions  Medication Sig Dispense Refill  . glucagon (GLUCAGEN) 1 MG SOLR injection Inject 1 mg into the vein once as needed for low blood sugar.    [name] acetaminophen (TYLENOL) 325 MG tablet Take 2 tablets (650 mg total) by mouth every 4 (four) hours as needed. 30 tablet 2  . alum & mag hydroxide-simeth (MAALOX/MYLANTA) 200-200-20 MG/5ML suspension Take 30 mLs by mouth every 6 (six) hours as needed for indigestion or heartburn.    [name] aspirin EC 81 MG tablet Take 81 mg by mouth daily.    Skipper Cliche SALINE NASAL NO-DRIP GEL Place into the nose 2 (two) times daily as needed (nose bleeds).    . docusate sodium (COLACE) 100 MG  capsule Take 200 mg by mouth at bedtime.     . dorzolamide-timolol (COSOPT) 22.3-6.8 MG/ML ophthalmic solution Place 1 drop into the left eye every 12 (twelve) hours.    . DULoxetine (CYMBALTA) 30 MG capsule Take 30 mg by mouth 2 (two) times daily.    . fentaNYL (DURAGESIC - DOSED MCG/HR) 100 MCG/HR Apply one patch topically every 3 days for pain 10 patch 0  . furosemide (LASIX) 40 MG tablet Take 40 mg by mouth 2 (two) times daily.     [name] glucosamine-chondroitin 500-400 MG tablet Take 1 tablet by mouth 3 (three) times daily.    [name] guaifenesin (ROBITUSSIN) 100 MG/5ML syrup Take 300 mg by mouth 3 (three) times daily as needed for cough.    . hydrOXYzine (ATARAX/VISTARIL) 25 MG tablet Take 25 mg by mouth at bedtime as needed. For itching    . insulin aspart (NOVOLOG) 100 UNIT/ML injection Inject 5 Units into the skin 3 (three) times daily with meals. 8 units If CBG is greater than 150 at lunch and 12 units if CBG is greater than 150 at breakfast and dinner    . insulin glargine (LANTUS) 100 UNIT/ML injection Inject 18 Units into the skin at bedtime.     . isosorbide mononitrate (IMDUR) 60 MG 24 hr tablet Take 60 mg by mouth every morning.     [name] levothyroxine (SYNTHROID, LEVOTHROID) 75 MCG tablet Take 75-112.5 mcg by mouth daily. Take 1 and 1/2 tablets on Saturdays then 1 tablet all other days    . LINZESS 145 MCG CAPS capsule Take 145 mcg by mouth daily. Take 30 mins prior to breakfast on an empty stomach.    . loratadine (CLARITIN) 10 MG tablet Take 10 mg by mouth daily.     [name] losartan (COZAAR) 25 MG tablet Take 25 mg by mouth daily.    [name] lubiprostone (AMITIZA) 24 MCG capsule Take 24 mcg by mouth 2 (two) times daily with a meal.      . Memantine HCl ER (NAMENDA XR) 28 MG CP24 Take by mouth.    . methylcellulose (ARTIFICIAL TEARS) 1 % ophthalmic solution Place 1 drop into both eyes 4 (four) times daily as needed (for dryness).    . Multiple Vitamins-Minerals (CERTAVITE SENIOR/ANTIOXIDANT PO) Take 1  tablet by mouth daily.    . nitroGLYCERIN (NITROSTAT) 0.4 MG SL tablet Place 0.4 mg under the tongue every 5 (five) minutes as needed. For chest pain    . Nutritional Supplements (RESOURCE 2.0 PO) Take 120 mLs by mouth 3 (three) times daily.    [name] omeprazole (PRILOSEC) 20 MG capsule Take 20 mg by mouth daily.     . Probiotic Product (ALIGN) 4 MG CAPS Take 1 capsule by mouth  daily.    . propranolol (INDERAL) 20 MG tablet Take 20 mg by mouth 2 (two) times daily.    [name] spironolactone (ALDACTONE) 25 MG tablet Take 25 mg by mouth daily.     . Vitamin D, Ergocalciferol, (DRISDOL) 50000 UNITS CAPS Take 50,000 Units by mouth every 30 (thirty) days. On the first of the month     No current facility-administered medications for this visit.    Allergies:   Penicillins; Bee venom; Celebrex; Lidocaine hcl; Phenobarbital; and Procaine hcl    Social History:  The patient  reports that she has never smoked. She does not have any smokeless tobacco history on file. She reports that she does not drink alcohol or use illicit drugs.   Family History:  The patient's family history is not on file.    ROS:  Please see the history of present illness.   Otherwise, review of systems are positive for none.   All other systems are reviewed and negative.    PHYSICAL EXAM: VS:  BP 120/70 mmHg  Pulse 73  Ht 5\' 6"  (1.676 m)  Wt 190 lb 12.8 oz (86.546 kg)  BMI 30.81 kg/m2  SpO2 98% , BMI Body mass index is 30.81 kg/(m^2). GEN: Well nourished, well developed, in no acute distress HEENT: normal Neck: no JVD, carotid bruits, or masses Cardiac: Irregularly irregular no murmurs, rubs, or gallops,no edema  Respiratory:  clear to auscultation bilaterally, normal work of breathing GI: soft, nontender, nondistended, + BS MS: no deformity or atrophy Skin: warm and dry, no rash Neuro:  Strength and sensation are intact Psych: euthymic mood, full affect   EKG:  EKG is not ordered today.    Recent Labs: 10/01/2014:  ALT 13; BUN 35*; Creatinine 0.9; Hemoglobin 14.5; Platelets 229; Potassium 4.9; Sodium 136* 01/29/2015: TSH 3.01    Lipid Panel    Component Value Date/Time   CHOL  10/22/2010 1042    100        ATP III CLASSIFICATION:  <200     mg/dL   Desirable  200-239  mg/dL   Borderline High  >=240    mg/dL   High          TRIG 80 10/22/2010 1042   HDL 55 10/22/2010 1042   CHOLHDL 1.8 10/22/2010 1042   VLDL 16 10/22/2010 1042   LDLCALC  10/22/2010 1042    29        Total Cholesterol/HDL:CHD Risk Coronary Heart Disease Risk Table                     Men   Women  1/2 Average Risk   3.4   3.3  Average Risk       5.0   4.4  2 X Average Risk   9.6   7.1  3 X Average Risk  23.4   11.0        Use the calculated Patient Ratio above and the CHD Risk Table to determine the patient's CHD Risk.        ATP III CLASSIFICATION (LDL):  <100     mg/dL   Optimal  100-129  mg/dL   Near or Above                    Optimal  130-159  mg/dL   Borderline  160-189  mg/dL   High  >190     mg/dL   Very High      Wt Readings from  Last 3 Encounters:  02/20/15 190 lb 12.8 oz (86.546 kg)  02/05/15 184 lb 3.2 oz (83.553 kg)  03/21/14 180 lb (81.647 kg)        ASSESSMENT AND PLAN:  1.  Ischemic heart disease 2.  Paroxysmal atrial fibrillation.  Not on warfarin for full anticoagulation because of comorbidities and prior problems with severe epistaxis. 3.  Essential hypertension 4.  Diabetes mellitus with past history of foot ulcers 5.  Worsening dementia 6.  Peripheral edema with chronic stasis dermatitis both lower extremities  Disposition: Continue on current medication.  Recheck in one year for follow-up office visit and EKG   Current medicines are reviewed at length with the patient today.  The patient does not have concerns regarding medicines.  The following changes have been made:  no change  Labs/ tests ordered today include:  No orders of the defined types were placed in this  encounter.        Signed, Darlin Coco, MD  02/20/2015 1:25 PM    [facility] [address], [place], [place]  [address] Phone: [phone]; Fax: [phone]

## 2015-02-20 NOTE — Patient Instructions (Signed)
Medication Instructions: Your physician recommends that you continue on your current medications as directed. Please refer to the Current Medication list given to you today.  Labwork: none  Testing/Procedures: none  Follow-Up: Your physician wants you to follow-up in: 1 year ov You will receive a reminder letter in the mail two months in advance. If you don't receive a letter, please call our office to schedule the follow-up appointment.     

## 2015-03-31 ENCOUNTER — Encounter: Payer: Self-pay | Admitting: Nurse Practitioner

## 2015-03-31 ENCOUNTER — Non-Acute Institutional Stay (SKILLED_NURSING_FACILITY): Payer: Medicare Other | Admitting: Nurse Practitioner

## 2015-03-31 DIAGNOSIS — I872 Venous insufficiency (chronic) (peripheral): Secondary | ICD-10-CM

## 2015-03-31 DIAGNOSIS — I259 Chronic ischemic heart disease, unspecified: Secondary | ICD-10-CM | POA: Diagnosis not present

## 2015-03-31 DIAGNOSIS — L8992 Pressure ulcer of unspecified site, stage 2: Secondary | ICD-10-CM

## 2015-03-31 DIAGNOSIS — E1142 Type 2 diabetes mellitus with diabetic polyneuropathy: Secondary | ICD-10-CM | POA: Diagnosis not present

## 2015-03-31 DIAGNOSIS — R1013 Epigastric pain: Secondary | ICD-10-CM

## 2015-03-31 DIAGNOSIS — R609 Edema, unspecified: Secondary | ICD-10-CM

## 2015-03-31 DIAGNOSIS — K5900 Constipation, unspecified: Secondary | ICD-10-CM

## 2015-03-31 DIAGNOSIS — I480 Paroxysmal atrial fibrillation: Secondary | ICD-10-CM | POA: Diagnosis not present

## 2015-03-31 DIAGNOSIS — F32A Depression, unspecified: Secondary | ICD-10-CM

## 2015-03-31 DIAGNOSIS — E039 Hypothyroidism, unspecified: Secondary | ICD-10-CM | POA: Diagnosis not present

## 2015-03-31 DIAGNOSIS — F03918 Unspecified dementia, unspecified severity, with other behavioral disturbance: Secondary | ICD-10-CM

## 2015-03-31 DIAGNOSIS — M5440 Lumbago with sciatica, unspecified side: Secondary | ICD-10-CM | POA: Diagnosis not present

## 2015-03-31 DIAGNOSIS — F0391 Unspecified dementia with behavioral disturbance: Secondary | ICD-10-CM

## 2015-03-31 DIAGNOSIS — G250 Essential tremor: Secondary | ICD-10-CM

## 2015-03-31 DIAGNOSIS — I10 Essential (primary) hypertension: Secondary | ICD-10-CM | POA: Diagnosis not present

## 2015-03-31 DIAGNOSIS — F329 Major depressive disorder, single episode, unspecified: Secondary | ICD-10-CM

## 2015-03-31 DIAGNOSIS — I8311 Varicose veins of right lower extremity with inflammation: Secondary | ICD-10-CM

## 2015-03-31 DIAGNOSIS — I8312 Varicose veins of left lower extremity with inflammation: Secondary | ICD-10-CM

## 2015-03-31 NOTE — Assessment & Plan Note (Signed)
Corrected with Levothyroxine 112.23mcg. 07/03/14 TSH 3.773. 01/29/15 TSH 3.011

## 2015-03-31 NOTE — Assessment & Plan Note (Signed)
Mild head titubation and hands tremor-not disabling. Takes Propranolol 10mg  bid

## 2015-03-31 NOTE — Assessment & Plan Note (Signed)
07/29/14 decreased Omeprazole to 20mg  daily-consultant pharmacist stable.

## 2015-03-31 NOTE — Assessment & Plan Note (Signed)
Better with Cymbalta 30mg  bid and Namenda. Observe the patient.   06/24/14 dc Ativan: infrequently use(1 dose in 3 months)  mood is stable.

## 2015-03-31 NOTE — Assessment & Plan Note (Signed)
Stable, takes colace 100 II qhs and Linzess 115mcg (off Amitiza 53mcg bid-therapeutic exchange by Pharm )and MiraLax prn.

## 2015-03-31 NOTE — Assessment & Plan Note (Signed)
Chronic-coccyalgia in nature, pain is controlled with Fentanyl 122mcg/hr

## 2015-03-31 NOTE — Assessment & Plan Note (Signed)
Chronic venous insufficiency. Takes Furosemide 40mg  bid and Spironolctone25mg  daily. Trace edema BLE

## 2015-03-31 NOTE — Assessment & Plan Note (Signed)
Lantus to 18units daily continue Novolog 5 u with meals for CBG>150 since 02/16/15.

## 2015-03-31 NOTE — Assessment & Plan Note (Signed)
Controlled, continue Losartan 25mg  daily, Atenolol 20mg  bid, monitor blood pressure daily. Dr. Mare Ferrari 02/03/14 continue same cardiac meds.

## 2015-03-31 NOTE — Assessment & Plan Note (Signed)
angina occasionally, f/u Cardiology, continue with Imdur 60mg  and Prn NTG(last dose 02/12/13) and ASA 81mg .

## 2015-03-31 NOTE — Assessment & Plan Note (Signed)
Gradual decline but adjusted to SNF well. Takes Namenda 28mg  daily.

## 2015-03-31 NOTE — Progress Notes (Signed)
Patient ID: [name], female   DOB: [birth year], 81 y.o.   MRN: [id number]   Code Status: DNR  Allergies  Allergen Reactions  . Penicillins Other (See Comments)    Per MAR  . Bee Venom Other (See Comments)    Per MAR  . Celebrex [Celecoxib] Other (See Comments)    Per MAR  . Lidocaine Hcl Other (See Comments)    Per MAR  . Phenobarbital Other (See Comments)    Per MAR  . Procaine Hcl Other (See Comments)    Per mar    Chief Complaint  Patient presents with  . Medical Management of Chronic Issues    HPI: Patient is a 81 y.o. female seen in the SNF at [facility] today for evaluation of chronic medical conditions.  Problem List Items Addressed This Visit    Type 2 diabetes mellitus with diabetic neuropathy affecting both sides of body (Chronic)    Lantus to 18units daily continue Novolog 5 u with meals for CBG>150 since 02/16/15.          Ischemic heart disease    angina occasionally, f/u Cardiology, continue with Imdur 60mg  and Prn NTG(last dose 02/12/13) and ASA 81mg .       Dyspepsia    07/29/14 decreased Omeprazole to 20mg  daily-consultant pharmacist stable.       Hypothyroidism    Corrected with Levothyroxine 112.32mcg. 07/03/14 TSH 3.773. 01/29/15 TSH 3.011        Depression    Better with Cymbalta 30mg  bid and Namenda. Observe the patient.   06/24/14 dc Ativan: infrequently use(1 dose in 3 months)  mood is stable.        Essential tremor    Mild head titubation and hands tremor-not disabling. Takes Propranolol 10mg  bid        Back pain    Chronic-coccyalgia in nature, pain is controlled with Fentanyl 122mcg/hr         HTN (hypertension)    Controlled, continue Losartan 25mg  daily, Atenolol 20mg  bid, monitor blood pressure daily. Dr. Mare Ferrari 02/03/14 continue same cardiac meds.         Constipation    Stable, takes colace 100 II qhs and Linzess 12mcg (off Amitiza 28mcg bid-therapeutic exchange by Pharm )and MiraLax prn.        Pressure ulcer stage II - Primary    Buttocks-pressure reduction and apply Silvadene cream to the area bid until healed.       Edema    Chronic venous insufficiency. Takes Furosemide 40mg  bid and Spironolctone25mg  daily. Trace edema BLE       A-fib    Heart rate is in control. Propranolol was increased to 20mg  bid for better rate control in setting of HTN and essential tremor. Saw Dr. Mare Ferrari 02/03/14 doing well, EKG A-fib with controlled VR. 1year office visit EKG       Dementia with behavioral disturbance    Gradual decline but adjusted to SNF well. Takes Namenda 28mg  daily.        Venous stasis dermatitis of both lower extremities    Chronic but stable, currently no open wounds or s/s of infection. Takes Claritin for itching          Review of Systems:  Review of Systems  Constitutional: Negative for fever, chills and diaphoresis.  HENT: Positive for hearing loss. Negative for congestion, ear discharge, ear pain, nosebleeds, sore throat and tinnitus.   Eyes: Negative for photophobia, pain, discharge and redness.  Respiratory: Negative for cough, shortness  of breath, wheezing and stridor.   Cardiovascular: Positive for leg swelling. Negative for chest pain and palpitations.  Gastrointestinal: Negative for nausea, vomiting, abdominal pain, diarrhea, constipation and blood in stool.  Endocrine: Negative for polydipsia.  Genitourinary: Positive for frequency. Negative for dysuria, urgency, hematuria and flank pain.  Musculoskeletal: Positive for back pain. Negative for myalgias and neck pain.  Skin: Positive for rash.       Excoriated buttocks with pressure injuries.   Allergic/Immunologic: Negative for environmental allergies.  Neurological: Negative for dizziness, tremors, seizures, weakness and headaches.  Hematological: Does not bruise/bleed easily.  Psychiatric/Behavioral: Negative for suicidal ideas and hallucinations. The patient is nervous/anxious.      Past  Medical History  Diagnosis Date  . Diabetes mellitus   . Hypertension   . Heart murmur   . Benign familial tremor   . Rosacea   . Diverticulitis   . Heart failure     diastolic heart failure  . TIA (transient ischemic attack) 2010  . Arthritis   . Breast cancer     breast -rt  . CAD (coronary artery disease)     single vessel CAD per cath in 2006; scattered nonobstructive disease in the left system, left to right collaterals to the RCA which was occluded by flush shots; she has been managed medically   Past Surgical History  Procedure Laterality Date  . Mastectomy partial / lumpectomy  11/02/2000    Right, with SLN  . Mastectomy partial / lumpectomy  05/15/2008    Left  . Breast lumpectomy  01/19/2010    right  . Mastectomy  03/31/2010    Right   Social History:   reports that she has never smoked. She does not have any smokeless tobacco history on file. She reports that she does not drink alcohol or use illicit drugs.  No family history on file.  Medications: Patient's Medications  New Prescriptions   No medications on file  Previous Medications   ACETAMINOPHEN (TYLENOL) 325 MG TABLET    Take 2 tablets (650 mg total) by mouth every 4 (four) hours as needed.   ALUM & MAG HYDROXIDE-SIMETH (MAALOX/MYLANTA) 200-200-20 MG/5ML SUSPENSION    Take 30 mLs by mouth every 6 (six) hours as needed for indigestion or heartburn.   ASPIRIN EC 81 MG TABLET    Take 81 mg by mouth daily.   AYR SALINE NASAL NO-DRIP GEL    Place into the nose 2 (two) times daily as needed (nose bleeds).   DOCUSATE SODIUM (COLACE) 100 MG CAPSULE    Take 200 mg by mouth at bedtime.    DORZOLAMIDE-TIMOLOL (COSOPT) 22.3-6.8 MG/ML OPHTHALMIC SOLUTION    Place 1 drop into the left eye every 12 (twelve) hours.   DULOXETINE (CYMBALTA) 30 MG CAPSULE    Take 30 mg by mouth 2 (two) times daily.   FENTANYL (DURAGESIC - DOSED MCG/HR) 100 MCG/HR    Apply one patch topically every 3 days for pain   FUROSEMIDE (LASIX) 40 MG  TABLET    Take 40 mg by mouth 2 (two) times daily.    GLUCAGON (GLUCAGEN) 1 MG SOLR INJECTION    Inject 1 mg into the vein once as needed for low blood sugar.   GLUCOSAMINE-CHONDROITIN 500-400 MG TABLET    Take 1 tablet by mouth 3 (three) times daily.   GUAIFENESIN (ROBITUSSIN) 100 MG/5ML SYRUP    Take 300 mg by mouth 3 (three) times daily as needed for cough.   HYDROXYZINE (ATARAX/VISTARIL) 25 MG  TABLET    Take 25 mg by mouth at bedtime as needed. For itching   INSULIN ASPART (NOVOLOG) 100 UNIT/ML INJECTION    Inject 5 Units into the skin 3 (three) times daily with meals. 8 units If CBG is greater than 150 at lunch and 12 units if CBG is greater than 150 at breakfast and dinner   INSULIN GLARGINE (LANTUS) 100 UNIT/ML INJECTION    Inject 18 Units into the skin at bedtime.    ISOSORBIDE MONONITRATE (IMDUR) 60 MG 24 HR TABLET    Take 60 mg by mouth every morning.    LEVOTHYROXINE (SYNTHROID, LEVOTHROID) 75 MCG TABLET    Take 75-112.5 mcg by mouth daily. Take 1 and 1/2 tablets on Saturdays then 1 tablet all other days   LINZESS 145 MCG CAPS CAPSULE    Take 145 mcg by mouth daily. Take 30 mins prior to breakfast on an empty stomach.   LORATADINE (CLARITIN) 10 MG TABLET    Take 10 mg by mouth daily.    LOSARTAN (COZAAR) 25 MG TABLET    Take 25 mg by mouth daily.   LUBIPROSTONE (AMITIZA) 24 MCG CAPSULE    Take 24 mcg by mouth 2 (two) times daily with a meal.     MEMANTINE HCL ER (NAMENDA XR) 28 MG CP24    Take by mouth.   METHYLCELLULOSE (ARTIFICIAL TEARS) 1 % OPHTHALMIC SOLUTION    Place 1 drop into both eyes 4 (four) times daily as needed (for dryness).   MULTIPLE VITAMINS-MINERALS (CERTAVITE SENIOR/ANTIOXIDANT PO)    Take 1 tablet by mouth daily.   NITROGLYCERIN (NITROSTAT) 0.4 MG SL TABLET    Place 0.4 mg under the tongue every 5 (five) minutes as needed. For chest pain   NUTRITIONAL SUPPLEMENTS (RESOURCE 2.0 PO)    Take 120 mLs by mouth 3 (three) times daily.   OMEPRAZOLE (PRILOSEC) 20 MG CAPSULE     Take 20 mg by mouth daily.    PROBIOTIC PRODUCT (ALIGN) 4 MG CAPS    Take 1 capsule by mouth daily.   PROPRANOLOL (INDERAL) 20 MG TABLET    Take 20 mg by mouth 2 (two) times daily.   SPIRONOLACTONE (ALDACTONE) 25 MG TABLET    Take 25 mg by mouth daily.    VITAMIN D, ERGOCALCIFEROL, (DRISDOL) 50000 UNITS CAPS    Take 50,000 Units by mouth every 30 (thirty) days. On the first of the month  Modified Medications   No medications on file  Discontinued Medications   No medications on file     Physical Exam: Physical Exam  Constitutional: She appears well-developed.  HENT:  Head: Normocephalic and atraumatic.  Left Ear: External ear normal.  Eyes: Conjunctivae and EOM are normal. Pupils are equal, round, and reactive to light.  Neck: Neck supple. No JVD present. No thyromegaly present.  Cardiovascular: Normal rate and regular rhythm.   Murmur heard. Pulmonary/Chest: Effort normal. She has rales.  Bibasilar rales.   Abdominal: Soft. There is no tenderness. There is no rebound.  Musculoskeletal: She exhibits edema and tenderness.  Back pain is chronic which is well controlled. BLE chronic edema trace only. Left foot external deviated deformity.   Lymphadenopathy:    She has no cervical adenopathy.  Neurological: She is alert. No cranial nerve deficit. Coordination normal.  Skin: Skin is warm and dry. Rash noted.  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 R+L Intergluteal cleft recurrent pressure uinjuries in mirror image-much smaller.  Left forefoot diabetic ulcer--healed. Excoriated urogenital/buttocks  Psychiatric: Her mood appears anxious. Her affect is not blunt, not labile and not inappropriate. Her speech is rapid and/or pressured (occasionally ). Her speech is not delayed and not slurred. She is hyperactive. She is not agitated, not aggressive, not slowed, not withdrawn, not actively hallucinating and not combative. Thought content is not paranoid and not delusional. She does not exhibit a  depressed mood. She exhibits abnormal recent memory.  MMSE 21/30 08/2013   Filed Vitals:   03/31/15 1401  BP: 136/78  Pulse: 67  Temp: 98.5 F (36.9 C)  TempSrc: Tympanic  Resp: 28      Labs reviewed: Basic Metabolic Panel:  Recent Labs  04/20/14 07/03/14 10/01/14 01/29/15  NA 136* 137 136*  --   K 4.9 4.2 4.9  --   BUN 30* 26* 35*  --   CREATININE 0.8 0.8 0.9  --   TSH  --  3.77  --  3.01   Liver Function Tests:  Recent Labs  04/20/14 10/01/14  AST 19 20  ALT 12 13  ALKPHOS 79 80   No results for input(s): LIPASE, AMYLASE in the last 8760 hours. No results for input(s): AMMONIA in the last 8760 hours. CBC:  Recent Labs  04/20/14 10/01/14  WBC 6.4 6.6  HGB 14.3 14.5  HCT 42 43  PLT 260 229   Lipid Panel: No results for input(s): CHOL, HDL, LDLCALC, TRIG, CHOLHDL, LDLDIRECT in the last 8760 hours.  Past Procedures:  10/22/13 CXR patchy interstitial changes left lower lung most consistent with pneumonia  11/21/13 CXR minimal cardiomegaly slightly increased without pulmonary vascular congestion or pleural effusion, no inflammatory consolidate or suspicious nodule, interval resolution of interstitial pneumonitis at the left lower lung.   01/29/14 for minor head injury and dementia: head CT w/o contrast: no acute findings.   04/18/14 X-ray L knee/ankle/tibia/fibula: no acute fracture or dislocation  07/18/14 X-ray L foot: the findings could be consistent with inflammatory process or osteomyelitis. Neuropathic arthropathy not excluded.   09/30/14 X-ray R rib: negative for appreciable right rib fracture   Assessment/Plan Pressure ulcer stage II Buttocks-pressure reduction and apply Silvadene cream to the area bid until healed.    Type 2 diabetes mellitus with diabetic neuropathy affecting both sides of body Lantus to 18units daily continue Novolog 5 u with meals for CBG>150 since 02/16/15.       Ischemic heart disease angina occasionally, f/u Cardiology,  continue with Imdur 60mg  and Prn NTG(last dose 02/12/13) and ASA 81mg .    Dyspepsia 07/29/14 decreased Omeprazole to 20mg  daily-consultant pharmacist stable.    Hypothyroidism Corrected with Levothyroxine 112.61mcg. 07/03/14 TSH 3.773. 01/29/15 TSH 3.011     Depression Better with Cymbalta 30mg  bid and Namenda. Observe the patient.   06/24/14 dc Ativan: infrequently use(1 dose in 3 months)  mood is stable.     Essential tremor Mild head titubation and hands tremor-not disabling. Takes Propranolol 10mg  bid     Back pain Chronic-coccyalgia in nature, pain is controlled with Fentanyl 16mcg/hr      HTN (hypertension) Controlled, continue Losartan 25mg  daily, Atenolol 20mg  bid, monitor blood pressure daily. Dr. Mare Ferrari 02/03/14 continue same cardiac meds.      Constipation Stable, takes colace 100 II qhs and Linzess 166mcg (off Amitiza 49mcg bid-therapeutic exchange by Pharm )and MiraLax prn.     Edema Chronic venous insufficiency. Takes Furosemide 40mg  bid and Spironolctone25mg  daily. Trace edema BLE    A-fib Heart rate is in control. Propranolol was increased to 20mg  bid for  better rate control in setting of HTN and essential tremor. Saw Dr. Mare Ferrari 02/03/14 doing well, EKG A-fib with controlled VR. 1year office visit EKG    Dementia with behavioral disturbance Gradual decline but adjusted to SNF well. Takes Namenda 28mg  daily.     Venous stasis dermatitis of both lower extremities Chronic but stable, currently no open wounds or s/s of infection. Takes Claritin for itching      Family/ Staff Communication: observe the patient.   Goals of Care: SNF  Labs/tests ordered: none

## 2015-03-31 NOTE — Assessment & Plan Note (Signed)
Heart rate is in control. Propranolol was increased to 20mg  bid for better rate control in setting of HTN and essential tremor. Saw Dr. Mare Ferrari 02/03/14 doing well, EKG A-fib with controlled VR. 1year office visit EKG

## 2015-03-31 NOTE — Assessment & Plan Note (Signed)
Chronic but stable, currently no open wounds or s/s of infection. Takes Claritin for itching

## 2015-03-31 NOTE — Assessment & Plan Note (Signed)
Buttocks-pressure reduction and apply Silvadene cream to the area bid until healed.

## 2015-04-24 ENCOUNTER — Non-Acute Institutional Stay (SKILLED_NURSING_FACILITY): Payer: Medicare Other | Admitting: Nurse Practitioner

## 2015-04-24 ENCOUNTER — Encounter: Payer: Self-pay | Admitting: Nurse Practitioner

## 2015-04-24 DIAGNOSIS — F0391 Unspecified dementia with behavioral disturbance: Secondary | ICD-10-CM

## 2015-04-24 DIAGNOSIS — I482 Chronic atrial fibrillation, unspecified: Secondary | ICD-10-CM

## 2015-04-24 DIAGNOSIS — K5900 Constipation, unspecified: Secondary | ICD-10-CM | POA: Diagnosis not present

## 2015-04-24 DIAGNOSIS — I259 Chronic ischemic heart disease, unspecified: Secondary | ICD-10-CM | POA: Diagnosis not present

## 2015-04-24 DIAGNOSIS — I10 Essential (primary) hypertension: Secondary | ICD-10-CM

## 2015-04-24 DIAGNOSIS — E1142 Type 2 diabetes mellitus with diabetic polyneuropathy: Secondary | ICD-10-CM | POA: Diagnosis not present

## 2015-04-24 DIAGNOSIS — G250 Essential tremor: Secondary | ICD-10-CM

## 2015-04-24 DIAGNOSIS — M5440 Lumbago with sciatica, unspecified side: Secondary | ICD-10-CM | POA: Diagnosis not present

## 2015-04-24 DIAGNOSIS — R609 Edema, unspecified: Secondary | ICD-10-CM | POA: Diagnosis not present

## 2015-04-24 DIAGNOSIS — F32A Depression, unspecified: Secondary | ICD-10-CM

## 2015-04-24 DIAGNOSIS — E039 Hypothyroidism, unspecified: Secondary | ICD-10-CM | POA: Diagnosis not present

## 2015-04-24 DIAGNOSIS — R1013 Epigastric pain: Secondary | ICD-10-CM

## 2015-04-24 DIAGNOSIS — I8312 Varicose veins of left lower extremity with inflammation: Secondary | ICD-10-CM

## 2015-04-24 DIAGNOSIS — L8992 Pressure ulcer of unspecified site, stage 2: Secondary | ICD-10-CM

## 2015-04-24 DIAGNOSIS — F329 Major depressive disorder, single episode, unspecified: Secondary | ICD-10-CM

## 2015-04-24 DIAGNOSIS — I872 Venous insufficiency (chronic) (peripheral): Secondary | ICD-10-CM

## 2015-04-24 DIAGNOSIS — F03918 Unspecified dementia, unspecified severity, with other behavioral disturbance: Secondary | ICD-10-CM

## 2015-04-24 DIAGNOSIS — I8311 Varicose veins of right lower extremity with inflammation: Secondary | ICD-10-CM

## 2015-04-24 NOTE — Assessment & Plan Note (Signed)
Lantus to 18units daily continue Novolog 5 u with meals for CBG>150 since 02/16/15. Last Hgb A1c 9.2. Update Hgb A1c

## 2015-04-24 NOTE — Assessment & Plan Note (Signed)
Chronic, not infected, pressure reduction and good personal hygiene

## 2015-04-24 NOTE — Assessment & Plan Note (Signed)
Better with Cymbalta 30mg  bid and Namenda. Observe the patient.   06/24/14 dc Ativan: infrequently use(1 dose in 3 months)  mood is stable.

## 2015-04-24 NOTE — Assessment & Plan Note (Signed)
Corrected with Levothyroxine 112.16mcg. 07/03/14 TSH 3.773. 01/29/15 TSH 3.01. F/u TSH

## 2015-04-24 NOTE — Assessment & Plan Note (Signed)
Chronic-coccyalgia in nature, pain is controlled with Fentanyl 131mcg/hr

## 2015-04-24 NOTE — Assessment & Plan Note (Signed)
Stable, continue Omeprazole 20mg daily.  

## 2015-04-24 NOTE — Assessment & Plan Note (Signed)
Chronic but stable, currently no open wounds or s/s of infection. Takes Claritin for itching

## 2015-04-24 NOTE — Assessment & Plan Note (Addendum)
Chronic venous insufficiency. Takes Furosemide 40mg  bid and Spironolctone25mg  daily. Trace edema BLE. Update CMP

## 2015-04-24 NOTE — Assessment & Plan Note (Signed)
Mild head titubation and hands tremor-not disabling. Takes Propranolol 10mg  bid

## 2015-04-24 NOTE — Progress Notes (Signed)
Patient ID: [name], female   DOB: 1926/05/19, 81 y.o.   MRN: [id number]   Code Status: DNR  Allergies  Allergen Reactions  . Penicillins Other (See Comments)    Per MAR  . Bee Venom Other (See Comments)    Per MAR  . Celebrex [Celecoxib] Other (See Comments)    Per MAR  . Lidocaine Hcl Other (See Comments)    Per MAR  . Phenobarbital Other (See Comments)    Per MAR  . Procaine Hcl Other (See Comments)    Per mar    No chief complaint on file.   HPI: Patient is a 81 y.o. female seen in the SNF at [facility] today for evaluation of chronic medical conditions.  Problem List Items Addressed This Visit    Type 2 diabetes mellitus with diabetic neuropathy affecting both sides of body - Primary (Chronic)    Lantus to 18units daily continue Novolog 5 u with meals for CBG>150 since 02/16/15. Last Hgb A1c 9.2. Update Hgb A1c       Ischemic heart disease    angina occasionally, f/u Cardiology, continue with Imdur 60mg  and Prn NTG(last dose 02/12/13) and ASA 81mg .         Dyspepsia    Stable, continue Omeprazole 20mg  daily.       Hypothyroidism    Corrected with Levothyroxine 112.45mcg. 07/03/14 TSH 3.773. 01/29/15 TSH 3.01. F/u TSH       Depression    Better with Cymbalta 30mg  bid and Namenda. Observe the patient.   06/24/14 dc Ativan: infrequently use(1 dose in 3 months)  mood is stable.         Essential tremor    Mild head titubation and hands tremor-not disabling. Takes Propranolol 10mg  bid         Back pain    Chronic-coccyalgia in nature, pain is controlled with Fentanyl 11mcg/hr         HTN (hypertension)    Controlled, continue Losartan 25mg  daily, Atenolol 20mg  bid, monitor blood pressure daily. Dr. Mare Ferrari 02/03/14 continue same cardiac meds.        Constipation    Stable, takes colace 100 II qhs and Linzess 182mcg (off Amitiza 92mcg bid-therapeutic exchange by Pharm )and MiraLax prn.         Pressure ulcer stage II    Chronic, not  infected, pressure reduction and good personal hygiene       Edema    Chronic venous insufficiency. Takes Furosemide 40mg  bid and Spironolctone25mg  daily. Trace edema BLE. Update CMP        A-fib    Heart rate is in control. Propranolol was increased to 20mg  bid for better rate control in setting of HTN and essential tremor. Saw Dr. Mare Ferrari 02/03/14 doing well, EKG A-fib with controlled VR. 1year office visit EKG        Dementia with behavioral disturbance    Gradual decline but adjusted to SNF well. Takes Namenda 28mg  daily.        Venous stasis dermatitis of both lower extremities    Chronic but stable, currently no open wounds or s/s of infection. Takes Claritin for itching           Review of Systems:  Review of Systems  Constitutional: Negative for fever, chills and diaphoresis.  HENT: Positive for hearing loss. Negative for congestion, ear discharge, ear pain, nosebleeds, sore throat and tinnitus.   Eyes: Negative for photophobia, pain, discharge and redness.  Respiratory: Negative for cough, shortness  of breath, wheezing and stridor.   Cardiovascular: Positive for leg swelling. Negative for chest pain and palpitations.  Gastrointestinal: Negative for nausea, vomiting, abdominal pain, diarrhea, constipation and blood in stool.  Endocrine: Negative for polydipsia.  Genitourinary: Positive for frequency. Negative for dysuria, urgency, hematuria and flank pain.  Musculoskeletal: Positive for back pain. Negative for myalgias and neck pain.  Skin: Positive for rash.       Excoriated buttocks with pressure injuries.   Allergic/Immunologic: Negative for environmental allergies.  Neurological: Negative for dizziness, tremors, seizures, weakness and headaches.  Hematological: Does not bruise/bleed easily.  Psychiatric/Behavioral: Negative for suicidal ideas and hallucinations. The patient is nervous/anxious.      Past Medical History  Diagnosis Date  . Diabetes  mellitus   . Hypertension   . Heart murmur   . Benign familial tremor   . Rosacea   . Diverticulitis   . Heart failure     diastolic heart failure  . TIA (transient ischemic attack) 2010  . Arthritis   . Breast cancer     breast -rt  . CAD (coronary artery disease)     single vessel CAD per cath in 2006; scattered nonobstructive disease in the left system, left to right collaterals to the RCA which was occluded by flush shots; she has been managed medically   Past Surgical History  Procedure Laterality Date  . Mastectomy partial / lumpectomy  11/02/2000    Right, with SLN  . Mastectomy partial / lumpectomy  05/15/2008    Left  . Breast lumpectomy  01/19/2010    right  . Mastectomy  03/31/2010    Right   Social History:   reports that she has never smoked. She does not have any smokeless tobacco history on file. She reports that she does not drink alcohol or use illicit drugs.  No family history on file.  Medications: Patient's Medications  New Prescriptions   No medications on file  Previous Medications   ACETAMINOPHEN (TYLENOL) 325 MG TABLET    Take 2 tablets (650 mg total) by mouth every 4 (four) hours as needed.   ALUM & MAG HYDROXIDE-SIMETH (MAALOX/MYLANTA) 200-200-20 MG/5ML SUSPENSION    Take 30 mLs by mouth every 6 (six) hours as needed for indigestion or heartburn.   ASPIRIN EC 81 MG TABLET    Take 81 mg by mouth daily.   AYR SALINE NASAL NO-DRIP GEL    Place into the nose 2 (two) times daily as needed (nose bleeds).   DOCUSATE SODIUM (COLACE) 100 MG CAPSULE    Take 200 mg by mouth at bedtime.    DORZOLAMIDE-TIMOLOL (COSOPT) 22.3-6.8 MG/ML OPHTHALMIC SOLUTION    Place 1 drop into the left eye every 12 (twelve) hours.   DULOXETINE (CYMBALTA) 30 MG CAPSULE    Take 30 mg by mouth 2 (two) times daily.   FENTANYL (DURAGESIC - DOSED MCG/HR) 100 MCG/HR    Apply one patch topically every 3 days for pain   FUROSEMIDE (LASIX) 40 MG TABLET    Take 40 mg by mouth 2 (two) times  daily.    GLUCAGON (GLUCAGEN) 1 MG SOLR INJECTION    Inject 1 mg into the vein once as needed for low blood sugar.   GLUCOSAMINE-CHONDROITIN 500-400 MG TABLET    Take 1 tablet by mouth 3 (three) times daily.   GUAIFENESIN (ROBITUSSIN) 100 MG/5ML SYRUP    Take 300 mg by mouth 3 (three) times daily as needed for cough.   HYDROXYZINE (ATARAX/VISTARIL) 25 MG  TABLET    Take 25 mg by mouth at bedtime as needed. For itching   INSULIN ASPART (NOVOLOG) 100 UNIT/ML INJECTION    Inject 5 Units into the skin 3 (three) times daily with meals. 8 units If CBG is greater than 150 at lunch and 12 units if CBG is greater than 150 at breakfast and dinner   INSULIN GLARGINE (LANTUS) 100 UNIT/ML INJECTION    Inject 18 Units into the skin at bedtime.    ISOSORBIDE MONONITRATE (IMDUR) 60 MG 24 HR TABLET    Take 60 mg by mouth every morning.    LEVOTHYROXINE (SYNTHROID, LEVOTHROID) 75 MCG TABLET    Take 75-112.5 mcg by mouth daily. Take 1 and 1/2 tablets on Saturdays then 1 tablet all other days   LINZESS 145 MCG CAPS CAPSULE    Take 145 mcg by mouth daily. Take 30 mins prior to breakfast on an empty stomach.   LORATADINE (CLARITIN) 10 MG TABLET    Take 10 mg by mouth daily.    LOSARTAN (COZAAR) 25 MG TABLET    Take 25 mg by mouth daily.   LUBIPROSTONE (AMITIZA) 24 MCG CAPSULE    Take 24 mcg by mouth 2 (two) times daily with a meal.     MEMANTINE HCL ER (NAMENDA XR) 28 MG CP24    Take by mouth.   METHYLCELLULOSE (ARTIFICIAL TEARS) 1 % OPHTHALMIC SOLUTION    Place 1 drop into both eyes 4 (four) times daily as needed (for dryness).   MULTIPLE VITAMINS-MINERALS (CERTAVITE SENIOR/ANTIOXIDANT PO)    Take 1 tablet by mouth daily.   NITROGLYCERIN (NITROSTAT) 0.4 MG SL TABLET    Place 0.4 mg under the tongue every 5 (five) minutes as needed. For chest pain   NUTRITIONAL SUPPLEMENTS (RESOURCE 2.0 PO)    Take 120 mLs by mouth 3 (three) times daily.   OMEPRAZOLE (PRILOSEC) 20 MG CAPSULE    Take 20 mg by mouth daily.    PROBIOTIC  PRODUCT (ALIGN) 4 MG CAPS    Take 1 capsule by mouth daily.   PROPRANOLOL (INDERAL) 20 MG TABLET    Take 20 mg by mouth 2 (two) times daily.   SPIRONOLACTONE (ALDACTONE) 25 MG TABLET    Take 25 mg by mouth daily.    VITAMIN D, ERGOCALCIFEROL, (DRISDOL) 50000 UNITS CAPS    Take 50,000 Units by mouth every 30 (thirty) days. On the first of the month  Modified Medications   No medications on file  Discontinued Medications   No medications on file     Physical Exam: Physical Exam  Constitutional: She appears well-developed.  HENT:  Head: Normocephalic and atraumatic.  Left Ear: External ear normal.  Eyes: Conjunctivae and EOM are normal. Pupils are equal, round, and reactive to light.  Neck: Neck supple. No JVD present. No thyromegaly present.  Cardiovascular: Normal rate and regular rhythm.   Murmur heard. Pulmonary/Chest: Effort normal. She has rales.  Bibasilar rales.   Abdominal: Soft. There is no tenderness. There is no rebound.  Musculoskeletal: She exhibits edema and tenderness.  Back pain is chronic which is well controlled. BLE chronic edema trace only. Left foot external deviated deformity.   Lymphadenopathy:    She has no cervical adenopathy.  Neurological: She is alert. No cranial nerve deficit. Coordination normal.  Skin: Skin is warm and dry. Rash noted.  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 R+L Intergluteal cleft recurrent pressure uinjuries in mirror image-much smaller.  Left forefoot diabetic ulcer--healed. Excoriated urogenital/buttocks  Psychiatric: Her mood appears anxious. Her affect is not blunt, not labile and not inappropriate. Her speech is rapid and/or pressured (occasionally ). Her speech is not delayed and not slurred. She is hyperactive. She is not agitated, not aggressive, not slowed, not withdrawn, not actively hallucinating and not combative. Thought content is not paranoid and not delusional. She does not exhibit a depressed mood. She exhibits abnormal  recent memory.  MMSE 21/30 08/2013   Filed Vitals:   04/24/15 1521  BP: 138/82  Pulse: 83  Temp: 98.2 F (36.8 C)  TempSrc: Tympanic  Resp: 20      Labs reviewed: Basic Metabolic Panel:  Recent Labs  07/03/14 10/01/14 01/29/15  NA 137 136*  --   K 4.2 4.9  --   BUN 26* 35*  --   CREATININE 0.8 0.9  --   TSH 3.77  --  3.01   Liver Function Tests:  Recent Labs  10/01/14  AST 20  ALT 13  ALKPHOS 80   No results for input(s): LIPASE, AMYLASE in the last 8760 hours. No results for input(s): AMMONIA in the last 8760 hours. CBC:  Recent Labs  10/01/14  WBC 6.6  HGB 14.5  HCT 43  PLT 229   Lipid Panel: No results for input(s): CHOL, HDL, LDLCALC, TRIG, CHOLHDL, LDLDIRECT in the last 8760 hours.  Past Procedures:  10/22/13 CXR patchy interstitial changes left lower lung most consistent with pneumonia  11/21/13 CXR minimal cardiomegaly slightly increased without pulmonary vascular congestion or pleural effusion, no inflammatory consolidate or suspicious nodule, interval resolution of interstitial pneumonitis at the left lower lung.   01/29/14 for minor head injury and dementia: head CT w/o contrast: no acute findings.   04/18/14 X-ray L knee/ankle/tibia/fibula: no acute fracture or dislocation  07/18/14 X-ray L foot: the findings could be consistent with inflammatory process or osteomyelitis. Neuropathic arthropathy not excluded.   09/30/14 X-ray R rib: negative for appreciable right rib fracture   Assessment/Plan Type 2 diabetes mellitus with diabetic neuropathy affecting both sides of body Lantus to 18units daily continue Novolog 5 u with meals for CBG>150 since 02/16/15. Last Hgb A1c 9.2. Update Hgb A1c   Ischemic heart disease angina occasionally, f/u Cardiology, continue with Imdur 60mg  and Prn NTG(last dose 02/12/13) and ASA 81mg .     Dyspepsia Stable, continue Omeprazole 20mg  daily.   Hypothyroidism Corrected with Levothyroxine 112.12mcg. 07/03/14 TSH  3.773. 01/29/15 TSH 3.01. F/u TSH   Depression Better with Cymbalta 30mg  bid and Namenda. Observe the patient.   06/24/14 dc Ativan: infrequently use(1 dose in 3 months)  mood is stable.     Essential tremor Mild head titubation and hands tremor-not disabling. Takes Propranolol 10mg  bid     Back pain Chronic-coccyalgia in nature, pain is controlled with Fentanyl 117mcg/hr     HTN (hypertension) Controlled, continue Losartan 25mg  daily, Atenolol 20mg  bid, monitor blood pressure daily. Dr. Mare Ferrari 02/03/14 continue same cardiac meds.    Constipation Stable, takes colace 100 II qhs and Linzess 137mcg (off Amitiza 56mcg bid-therapeutic exchange by Pharm )and MiraLax prn.     Pressure ulcer stage II Chronic, not infected, pressure reduction and good personal hygiene   Edema Chronic venous insufficiency. Takes Furosemide 40mg  bid and Spironolctone25mg  daily. Trace edema BLE. Update CMP    A-fib Heart rate is in control. Propranolol was increased to 20mg  bid for better rate control in setting of HTN and essential tremor. Saw Dr. Mare Ferrari 02/03/14 doing well, EKG A-fib with controlled VR. 1year  office visit EKG    Dementia with behavioral disturbance Gradual decline but adjusted to SNF well. Takes Namenda 28mg  daily.    Venous stasis dermatitis of both lower extremities Chronic but stable, currently no open wounds or s/s of infection. Takes Claritin for itching      Family/ Staff Communication: observe the patient.   Goals of Care: SNF  Labs/tests ordered: CBC, CMP, TSH, Hgb A1c

## 2015-04-24 NOTE — Assessment & Plan Note (Signed)
Controlled, continue Losartan 25mg  daily, Atenolol 20mg  bid, monitor blood pressure daily. Dr. Mare Ferrari 02/03/14 continue same cardiac meds.

## 2015-04-24 NOTE — Assessment & Plan Note (Signed)
angina occasionally, f/u Cardiology, continue with Imdur 60mg  and Prn NTG(last dose 02/12/13) and ASA 81mg .

## 2015-04-24 NOTE — Assessment & Plan Note (Signed)
Stable, takes colace 100 II qhs and Linzess 139mcg (off Amitiza 9mcg bid-therapeutic exchange by Pharm )and MiraLax prn.

## 2015-04-24 NOTE — Assessment & Plan Note (Signed)
Heart rate is in control. Propranolol was increased to 20mg  bid for better rate control in setting of HTN and essential tremor. Saw Dr. Mare Ferrari 02/03/14 doing well, EKG A-fib with controlled VR. 1year office visit EKG

## 2015-04-24 NOTE — Assessment & Plan Note (Signed)
Gradual decline but adjusted to SNF well. Takes Namenda 28mg  daily.

## 2015-04-27 LAB — CBC AND DIFFERENTIAL
HCT: 51 % — AB (ref 36–46)
Hemoglobin: 17.3 g/dL — AB (ref 12.0–16.0)
Platelets: 220 10*3/uL (ref 150–399)
WBC: 6.8 10*3/mL

## 2015-04-27 LAB — BASIC METABOLIC PANEL
BUN: 28 mg/dL — AB (ref 4–21)
CREATININE: 0.9 mg/dL (ref 0.5–1.1)
Glucose: 149 mg/dL
Potassium: 3.7 mmol/L (ref 3.4–5.3)
SODIUM: 137 mmol/L (ref 137–147)

## 2015-04-27 LAB — HEPATIC FUNCTION PANEL
ALK PHOS: 101 U/L (ref 25–125)
ALT: 14 U/L (ref 7–35)
AST: 17 U/L (ref 13–35)
Bilirubin, Total: 0.6 mg/dL

## 2015-04-27 LAB — HEMOGLOBIN A1C: HEMOGLOBIN A1C: 9.6 % — AB (ref 4.0–6.0)

## 2015-04-27 LAB — TSH: TSH: 2.66 u[IU]/mL (ref 0.41–5.90)

## 2015-04-28 ENCOUNTER — Other Ambulatory Visit: Payer: Self-pay | Admitting: Nurse Practitioner

## 2015-04-28 DIAGNOSIS — E1142 Type 2 diabetes mellitus with diabetic polyneuropathy: Secondary | ICD-10-CM

## 2015-04-28 DIAGNOSIS — E039 Hypothyroidism, unspecified: Secondary | ICD-10-CM

## 2015-04-30 LAB — CBC AND DIFFERENTIAL
HEMATOCRIT: 51 % — AB (ref 36–46)
HEMOGLOBIN: 17.5 g/dL — AB (ref 12.0–16.0)
PLATELETS: 208 10*3/uL (ref 150–399)
WBC: 6.4 10^3/mL

## 2015-05-01 ENCOUNTER — Encounter: Payer: Self-pay | Admitting: Nurse Practitioner

## 2015-05-01 ENCOUNTER — Non-Acute Institutional Stay (SKILLED_NURSING_FACILITY): Payer: Medicare Other | Admitting: Nurse Practitioner

## 2015-05-01 DIAGNOSIS — R1013 Epigastric pain: Secondary | ICD-10-CM | POA: Diagnosis not present

## 2015-05-01 DIAGNOSIS — G250 Essential tremor: Secondary | ICD-10-CM | POA: Diagnosis not present

## 2015-05-01 DIAGNOSIS — F329 Major depressive disorder, single episode, unspecified: Secondary | ICD-10-CM

## 2015-05-01 DIAGNOSIS — I259 Chronic ischemic heart disease, unspecified: Secondary | ICD-10-CM

## 2015-05-01 DIAGNOSIS — I482 Chronic atrial fibrillation, unspecified: Secondary | ICD-10-CM

## 2015-05-01 DIAGNOSIS — E1142 Type 2 diabetes mellitus with diabetic polyneuropathy: Secondary | ICD-10-CM | POA: Diagnosis not present

## 2015-05-01 DIAGNOSIS — I8312 Varicose veins of left lower extremity with inflammation: Secondary | ICD-10-CM

## 2015-05-01 DIAGNOSIS — E039 Hypothyroidism, unspecified: Secondary | ICD-10-CM

## 2015-05-01 DIAGNOSIS — I872 Venous insufficiency (chronic) (peripheral): Secondary | ICD-10-CM

## 2015-05-01 DIAGNOSIS — D751 Secondary polycythemia: Secondary | ICD-10-CM

## 2015-05-01 DIAGNOSIS — K5900 Constipation, unspecified: Secondary | ICD-10-CM | POA: Diagnosis not present

## 2015-05-01 DIAGNOSIS — I10 Essential (primary) hypertension: Secondary | ICD-10-CM | POA: Diagnosis not present

## 2015-05-01 DIAGNOSIS — R609 Edema, unspecified: Secondary | ICD-10-CM | POA: Diagnosis not present

## 2015-05-01 DIAGNOSIS — F32A Depression, unspecified: Secondary | ICD-10-CM

## 2015-05-01 DIAGNOSIS — I8311 Varicose veins of right lower extremity with inflammation: Secondary | ICD-10-CM

## 2015-05-01 DIAGNOSIS — M5440 Lumbago with sciatica, unspecified side: Secondary | ICD-10-CM

## 2015-05-01 DIAGNOSIS — L8992 Pressure ulcer of unspecified site, stage 2: Secondary | ICD-10-CM

## 2015-05-01 DIAGNOSIS — F03918 Unspecified dementia, unspecified severity, with other behavioral disturbance: Secondary | ICD-10-CM

## 2015-05-01 DIAGNOSIS — F0391 Unspecified dementia with behavioral disturbance: Secondary | ICD-10-CM

## 2015-05-01 NOTE — Assessment & Plan Note (Signed)
Gradual decline but adjusted to SNF well. Takes Namenda 28mg  daily.

## 2015-05-01 NOTE — Assessment & Plan Note (Signed)
angina occasionally, f/u Cardiology, continue with Imdur 60mg  and Prn NTG(last dose 02/12/13) and ASA 81mg .

## 2015-05-01 NOTE — Assessment & Plan Note (Signed)
Chronic but stable, currently no open wounds or s/s of infection. Takes Claritin for itching

## 2015-05-01 NOTE — Assessment & Plan Note (Signed)
Mild head titubation and hands tremor-not disabling. Takes Propranolol 10mg  bid

## 2015-05-01 NOTE — Assessment & Plan Note (Signed)
Chronic buttocks sedentary and moist from incontinent of bladder.

## 2015-05-01 NOTE — Assessment & Plan Note (Signed)
Chronic venous insufficiency. Takes Furosemide 40mg  bid and Spironolctone25mg  daily. Trace edema BLE.

## 2015-05-01 NOTE — Assessment & Plan Note (Signed)
Better with Cymbalta 30mg  bid and Namenda. Observe the patient.   06/24/14 dc Ativan: infrequently use(1 dose in 3 months)  mood is stable.

## 2015-05-01 NOTE — Assessment & Plan Note (Signed)
Heart rate is in control. Propranolol was increased to 20mg  bid for better rate control in setting of HTN and essential tremor. Saw Dr. Mare Ferrari 02/03/14 doing well, EKG A-fib with controlled VR. 1year office visit EKG

## 2015-05-01 NOTE — Assessment & Plan Note (Signed)
6 /16/16 RBC 5.36, Hgb 17.5, Htc 31.0-Hematology when the patient desires.

## 2015-05-01 NOTE — Assessment & Plan Note (Signed)
Stable, takes colace 100 II qhs and Linzess 168mcg (off Amitiza 47mcg bid-therapeutic exchange by Pharm )and MiraLax prn.

## 2015-05-01 NOTE — Assessment & Plan Note (Signed)
Stable, continue Omeprazole 20mg daily.  

## 2015-05-01 NOTE — Assessment & Plan Note (Signed)
Corrected with Levothyroxine 112.63mcg. 07/03/14 TSH 3.773. 01/29/15 TSH 3.01.

## 2015-05-01 NOTE — Progress Notes (Signed)
Patient ID: [name], female   DOB: 1926-08-13, 81 y.o.   MRN: [id number]   Code Status: DNR  Allergies  Allergen Reactions  . Penicillins Other (See Comments)    Per MAR  . Bee Venom Other (See Comments)    Per MAR  . Celebrex [Celecoxib] Other (See Comments)    Per MAR  . Lidocaine Hcl Other (See Comments)    Per MAR  . Phenobarbital Other (See Comments)    Per MAR  . Procaine Hcl Other (See Comments)    Per mar    Chief Complaint  Patient presents with  . Medical Management of Chronic Issues  . Acute Visit    elevated RBC and Hgb    HPI: Patient is a 81 y.o. female seen in the SNF at [facility] today for evaluation of elevated RBC/Hgb and chronic medical conditions.  Problem List Items Addressed This Visit    Type 2 diabetes mellitus with diabetic neuropathy affecting both sides of body (Chronic)    Lantus 18units daily,  continue Novolog 5 u with meals for CBG>150 since 02/16/15. Last Hgb A1c 9.2.         Ischemic heart disease    angina occasionally, f/u Cardiology, continue with Imdur 60mg  and Prn NTG(last dose 02/12/13) and ASA 81mg .          Dyspepsia    Stable, continue Omeprazole 20mg  daily.        Hypothyroidism    Corrected with Levothyroxine 112.79mcg. 07/03/14 TSH 3.773. 01/29/15 TSH 3.01.      Depression    Better with Cymbalta 30mg  bid and Namenda. Observe the patient.   06/24/14 dc Ativan: infrequently use(1 dose in 3 months)  mood is stable.         Essential tremor    Mild head titubation and hands tremor-not disabling. Takes Propranolol 10mg  bid         Back pain    Chronic-coccyalgia in nature, pain is controlled with Fentanyl 120mcg/hr         HTN (hypertension)    Controlled, continue Losartan 25mg  daily, Atenolol 20mg  bid, monitor blood pressure daily. Dr. Mare Ferrari 02/03/14 continue same cardiac meds.         Constipation    Stable, takes colace 100 II qhs and Linzess 168mcg (off Amitiza 61mcg bid-therapeutic  exchange by Pharm )and MiraLax prn.       Pressure ulcer stage II    Chronic buttocks sedentary and moist from incontinent of bladder.       Edema    Chronic venous insufficiency. Takes Furosemide 40mg  bid and Spironolctone25mg  daily. Trace edema BLE.      A-fib    Heart rate is in control. Propranolol was increased to 20mg  bid for better rate control in setting of HTN and essential tremor. Saw Dr. Mare Ferrari 02/03/14 doing well, EKG A-fib with controlled VR. 1year office visit EKG        Dementia with behavioral disturbance    Gradual decline but adjusted to SNF well. Takes Namenda 28mg  daily.         Venous stasis dermatitis of both lower extremities    Chronic but stable, currently no open wounds or s/s of infection. Takes Claritin for itching        Erythrocytosis - Primary    6 /16/16 RBC 5.36, Hgb 17.5, Htc 31.0-Hematology when the patient desires.         Review of Systems:  Review of Systems  Constitutional: Negative for  fever, chills and diaphoresis.  HENT: Positive for hearing loss. Negative for congestion, ear discharge, ear pain, nosebleeds, sore throat and tinnitus.   Eyes: Negative for photophobia, pain, discharge and redness.  Respiratory: Negative for cough, shortness of breath, wheezing and stridor.   Cardiovascular: Positive for leg swelling. Negative for chest pain and palpitations.  Gastrointestinal: Negative for nausea, vomiting, abdominal pain, diarrhea, constipation and blood in stool.  Endocrine: Negative for polydipsia.  Genitourinary: Positive for frequency. Negative for dysuria, urgency, hematuria and flank pain.  Musculoskeletal: Positive for back pain. Negative for myalgias and neck pain.  Skin: Positive for rash.       Excoriated buttocks with pressure injuries.   Allergic/Immunologic: Negative for environmental allergies.  Neurological: Negative for dizziness, tremors, seizures, weakness and headaches.  Hematological: Does not  bruise/bleed easily.  Psychiatric/Behavioral: Negative for suicidal ideas and hallucinations. The patient is nervous/anxious.      Past Medical History  Diagnosis Date  . Diabetes mellitus   . Hypertension   . Heart murmur   . Benign familial tremor   . Rosacea   . Diverticulitis   . Heart failure     diastolic heart failure  . TIA (transient ischemic attack) 2010  . Arthritis   . Breast cancer     breast -rt  . CAD (coronary artery disease)     single vessel CAD per cath in 2006; scattered nonobstructive disease in the left system, left to right collaterals to the RCA which was occluded by flush shots; she has been managed medically   Past Surgical History  Procedure Laterality Date  . Mastectomy partial / lumpectomy  11/02/2000    Right, with SLN  . Mastectomy partial / lumpectomy  05/15/2008    Left  . Breast lumpectomy  01/19/2010    right  . Mastectomy  03/31/2010    Right   Social History:   reports that she has never smoked. She does not have any smokeless tobacco history on file. She reports that she does not drink alcohol or use illicit drugs.  No family history on file.  Medications: Patient's Medications  New Prescriptions   No medications on file  Previous Medications   ACETAMINOPHEN (TYLENOL) 325 MG TABLET    Take 2 tablets (650 mg total) by mouth every 4 (four) hours as needed.   ALUM & MAG HYDROXIDE-SIMETH (MAALOX/MYLANTA) 200-200-20 MG/5ML SUSPENSION    Take 30 mLs by mouth every 6 (six) hours as needed for indigestion or heartburn.   ASPIRIN EC 81 MG TABLET    Take 81 mg by mouth daily.   AYR SALINE NASAL NO-DRIP GEL    Place into the nose 2 (two) times daily as needed (nose bleeds).   DOCUSATE SODIUM (COLACE) 100 MG CAPSULE    Take 200 mg by mouth at bedtime.    DORZOLAMIDE-TIMOLOL (COSOPT) 22.3-6.8 MG/ML OPHTHALMIC SOLUTION    Place 1 drop into the left eye every 12 (twelve) hours.   DULOXETINE (CYMBALTA) 30 MG CAPSULE    Take 30 mg by mouth 2 (two)  times daily.   FENTANYL (DURAGESIC - DOSED MCG/HR) 100 MCG/HR    Apply one patch topically every 3 days for pain   FUROSEMIDE (LASIX) 40 MG TABLET    Take 40 mg by mouth 2 (two) times daily.    GLUCAGON (GLUCAGEN) 1 MG SOLR INJECTION    Inject 1 mg into the vein once as needed for low blood sugar.   GLUCOSAMINE-CHONDROITIN 500-400 MG TABLET  Take 1 tablet by mouth 3 (three) times daily.   GUAIFENESIN (ROBITUSSIN) 100 MG/5ML SYRUP    Take 300 mg by mouth 3 (three) times daily as needed for cough.   HYDROXYZINE (ATARAX/VISTARIL) 25 MG TABLET    Take 25 mg by mouth at bedtime as needed. For itching   INSULIN ASPART (NOVOLOG) 100 UNIT/ML INJECTION    Inject 5 Units into the skin 3 (three) times daily with meals. 8 units If CBG is greater than 150 at lunch and 12 units if CBG is greater than 150 at breakfast and dinner   INSULIN GLARGINE (LANTUS) 100 UNIT/ML INJECTION    Inject 18 Units into the skin at bedtime.    ISOSORBIDE MONONITRATE (IMDUR) 60 MG 24 HR TABLET    Take 60 mg by mouth every morning.    LEVOTHYROXINE (SYNTHROID, LEVOTHROID) 75 MCG TABLET    Take 75-112.5 mcg by mouth daily. Take 1 and 1/2 tablets on Saturdays then 1 tablet all other days   LINZESS 145 MCG CAPS CAPSULE    Take 145 mcg by mouth daily. Take 30 mins prior to breakfast on an empty stomach.   LORATADINE (CLARITIN) 10 MG TABLET    Take 10 mg by mouth daily.    LOSARTAN (COZAAR) 25 MG TABLET    Take 25 mg by mouth daily.   LUBIPROSTONE (AMITIZA) 24 MCG CAPSULE    Take 24 mcg by mouth 2 (two) times daily with a meal.     MEMANTINE HCL ER (NAMENDA XR) 28 MG CP24    Take by mouth.   METHYLCELLULOSE (ARTIFICIAL TEARS) 1 % OPHTHALMIC SOLUTION    Place 1 drop into both eyes 4 (four) times daily as needed (for dryness).   MULTIPLE VITAMINS-MINERALS (CERTAVITE SENIOR/ANTIOXIDANT PO)    Take 1 tablet by mouth daily.   NITROGLYCERIN (NITROSTAT) 0.4 MG SL TABLET    Place 0.4 mg under the tongue every 5 (five) minutes as needed. For  chest pain   NUTRITIONAL SUPPLEMENTS (RESOURCE 2.0 PO)    Take 120 mLs by mouth 3 (three) times daily.   OMEPRAZOLE (PRILOSEC) 20 MG CAPSULE    Take 20 mg by mouth daily.    PROBIOTIC PRODUCT (ALIGN) 4 MG CAPS    Take 1 capsule by mouth daily.   PROPRANOLOL (INDERAL) 20 MG TABLET    Take 20 mg by mouth 2 (two) times daily.   SPIRONOLACTONE (ALDACTONE) 25 MG TABLET    Take 25 mg by mouth daily.    VITAMIN D, ERGOCALCIFEROL, (DRISDOL) 50000 UNITS CAPS    Take 50,000 Units by mouth every 30 (thirty) days. On the first of the month  Modified Medications   No medications on file  Discontinued Medications   No medications on file     Physical Exam: Physical Exam  Constitutional: She appears well-developed.  HENT:  Head: Normocephalic and atraumatic.  Left Ear: External ear normal.  Eyes: Conjunctivae and EOM are normal. Pupils are equal, round, and reactive to light.  Neck: Neck supple. No JVD present. No thyromegaly present.  Cardiovascular: Normal rate and regular rhythm.   Murmur heard. Pulmonary/Chest: Effort normal. She has rales.  Bibasilar rales.   Abdominal: Soft. There is no tenderness. There is no rebound.  Musculoskeletal: She exhibits edema and tenderness.  Back pain is chronic which is well controlled. BLE chronic edema trace only. Left foot external deviated deformity.   Lymphadenopathy:    She has no cervical adenopathy.  Neurological: She is alert. No cranial nerve  deficit. Coordination normal.  Skin: Skin is warm and dry. Rash noted.  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 R+L Intergluteal cleft recurrent pressure uinjuries in mirror image-much smaller.  Left forefoot diabetic ulcer--healed. Excoriated urogenital/buttocks     Psychiatric: Her mood appears anxious. Her affect is not blunt, not labile and not inappropriate. Her speech is rapid and/or pressured (occasionally ). Her speech is not delayed and not slurred. She is hyperactive. She is not agitated, not aggressive,  not slowed, not withdrawn, not actively hallucinating and not combative. Thought content is not paranoid and not delusional. She does not exhibit a depressed mood. She exhibits abnormal recent memory.  MMSE 21/30 08/2013   Filed Vitals:   05/01/15 1602  BP: 128/60  Pulse: 68  Temp: 97.8 F (36.6 C)  TempSrc: Tympanic  Resp: 18      Labs reviewed: Basic Metabolic Panel:  Recent Labs  07/03/14 10/01/14 01/29/15 04/27/15  NA 137 136*  --  137  K 4.2 4.9  --  3.7  BUN 26* 35*  --  28*  CREATININE 0.8 0.9  --  0.9  TSH 3.77  --  3.01 2.66   Liver Function Tests:  Recent Labs  10/01/14 04/27/15  AST 20 17  ALT 13 14  ALKPHOS 80 101   No results for input(s): LIPASE, AMYLASE in the last 8760 hours. No results for input(s): AMMONIA in the last 8760 hours. CBC:  Recent Labs  10/01/14 04/27/15 04/30/15  WBC 6.6 6.8 6.4  HGB 14.5 17.3* 17.5*  HCT 43 51* 51*  PLT 229 220 208   Lipid Panel: No results for input(s): CHOL, HDL, LDLCALC, TRIG, CHOLHDL, LDLDIRECT in the last 8760 hours.  Past Procedures:  10/22/13 CXR patchy interstitial changes left lower lung most consistent with pneumonia  11/21/13 CXR minimal cardiomegaly slightly increased without pulmonary vascular congestion or pleural effusion, no inflammatory consolidate or suspicious nodule, interval resolution of interstitial pneumonitis at the left lower lung.   01/29/14 for minor head injury and dementia: head CT w/o contrast: no acute findings.   04/18/14 X-ray L knee/ankle/tibia/fibula: no acute fracture or dislocation  07/18/14 X-ray L foot: the findings could be consistent with inflammatory process or osteomyelitis. Neuropathic arthropathy not excluded.   09/30/14 X-ray R rib: negative for appreciable right rib fracture   Assessment/Plan Erythrocytosis 6 /16/16 RBC 5.36, Hgb 17.5, Htc 31.0-Hematology when the patient desires.  Type 2 diabetes mellitus with diabetic neuropathy affecting both sides of  body Lantus 18units daily,  continue Novolog 5 u with meals for CBG>150 since 02/16/15. Last Hgb A1c 9.2.     Ischemic heart disease angina occasionally, f/u Cardiology, continue with Imdur 60mg  and Prn NTG(last dose 02/12/13) and ASA 81mg .      Dyspepsia Stable, continue Omeprazole 20mg  daily.    Hypothyroidism Corrected with Levothyroxine 112.21mcg. 07/03/14 TSH 3.773. 01/29/15 TSH 3.01.  Depression Better with Cymbalta 30mg  bid and Namenda. Observe the patient.   06/24/14 dc Ativan: infrequently use(1 dose in 3 months)  mood is stable.     Essential tremor Mild head titubation and hands tremor-not disabling. Takes Propranolol 10mg  bid     Back pain Chronic-coccyalgia in nature, pain is controlled with Fentanyl 136mcg/hr     HTN (hypertension) Controlled, continue Losartan 25mg  daily, Atenolol 20mg  bid, monitor blood pressure daily. Dr. Mare Ferrari 02/03/14 continue same cardiac meds.     Constipation Stable, takes colace 100 II qhs and Linzess 144mcg (off Amitiza 28mcg bid-therapeutic exchange by Pharm )and MiraLax prn.  Pressure ulcer stage II Chronic buttocks sedentary and moist from incontinent of bladder.   Edema Chronic venous insufficiency. Takes Furosemide 40mg  bid and Spironolctone25mg  daily. Trace edema BLE.  A-fib Heart rate is in control. Propranolol was increased to 20mg  bid for better rate control in setting of HTN and essential tremor. Saw Dr. Mare Ferrari 02/03/14 doing well, EKG A-fib with controlled VR. 1year office visit EKG    Dementia with behavioral disturbance Gradual decline but adjusted to SNF well. Takes Namenda 28mg  daily.     Venous stasis dermatitis of both lower extremities Chronic but stable, currently no open wounds or s/s of infection. Takes Claritin for itching      Family/ Staff Communication: observe the patient. Hematology referral when patient desires.   Goals of Care: SNF  Labs/tests ordered: none

## 2015-05-01 NOTE — Assessment & Plan Note (Signed)
Chronic-coccyalgia in nature, pain is controlled with Fentanyl 162mcg/hr

## 2015-05-01 NOTE — Assessment & Plan Note (Signed)
Lantus 18units daily,  continue Novolog 5 u with meals for CBG>150 since 02/16/15. Last Hgb A1c 9.2.

## 2015-05-01 NOTE — Assessment & Plan Note (Signed)
Controlled, continue Losartan 25mg  daily, Atenolol 20mg  bid, monitor blood pressure daily. Dr. Mare Ferrari 02/03/14 continue same cardiac meds.

## 2015-05-12 ENCOUNTER — Other Ambulatory Visit: Payer: Self-pay | Admitting: *Deleted

## 2015-05-12 MED ORDER — FENTANYL 100 MCG/HR TD PT72: MEDICATED_PATCH | TRANSDERMAL | Status: DC

## 2015-05-12 NOTE — Telephone Encounter (Signed)
[facility] 

## 2015-05-19 ENCOUNTER — Non-Acute Institutional Stay (SKILLED_NURSING_FACILITY): Payer: Medicare Other | Admitting: Nurse Practitioner

## 2015-05-19 ENCOUNTER — Encounter: Payer: Self-pay | Admitting: Nurse Practitioner

## 2015-05-19 DIAGNOSIS — I8311 Varicose veins of right lower extremity with inflammation: Secondary | ICD-10-CM

## 2015-05-19 DIAGNOSIS — E039 Hypothyroidism, unspecified: Secondary | ICD-10-CM | POA: Diagnosis not present

## 2015-05-19 DIAGNOSIS — M5440 Lumbago with sciatica, unspecified side: Secondary | ICD-10-CM | POA: Diagnosis not present

## 2015-05-19 DIAGNOSIS — G250 Essential tremor: Secondary | ICD-10-CM

## 2015-05-19 DIAGNOSIS — R296 Repeated falls: Secondary | ICD-10-CM | POA: Diagnosis not present

## 2015-05-19 DIAGNOSIS — N390 Urinary tract infection, site not specified: Secondary | ICD-10-CM | POA: Diagnosis not present

## 2015-05-19 DIAGNOSIS — R609 Edema, unspecified: Secondary | ICD-10-CM

## 2015-05-19 DIAGNOSIS — L8992 Pressure ulcer of unspecified site, stage 2: Secondary | ICD-10-CM | POA: Diagnosis not present

## 2015-05-19 DIAGNOSIS — I259 Chronic ischemic heart disease, unspecified: Secondary | ICD-10-CM | POA: Diagnosis not present

## 2015-05-19 DIAGNOSIS — K5900 Constipation, unspecified: Secondary | ICD-10-CM | POA: Diagnosis not present

## 2015-05-19 DIAGNOSIS — E1142 Type 2 diabetes mellitus with diabetic polyneuropathy: Secondary | ICD-10-CM

## 2015-05-19 DIAGNOSIS — F329 Major depressive disorder, single episode, unspecified: Secondary | ICD-10-CM | POA: Diagnosis not present

## 2015-05-19 DIAGNOSIS — I482 Chronic atrial fibrillation, unspecified: Secondary | ICD-10-CM

## 2015-05-19 DIAGNOSIS — F0391 Unspecified dementia with behavioral disturbance: Secondary | ICD-10-CM

## 2015-05-19 DIAGNOSIS — I10 Essential (primary) hypertension: Secondary | ICD-10-CM | POA: Diagnosis not present

## 2015-05-19 DIAGNOSIS — F03918 Unspecified dementia, unspecified severity, with other behavioral disturbance: Secondary | ICD-10-CM

## 2015-05-19 DIAGNOSIS — D751 Secondary polycythemia: Secondary | ICD-10-CM

## 2015-05-19 DIAGNOSIS — I8312 Varicose veins of left lower extremity with inflammation: Secondary | ICD-10-CM

## 2015-05-19 DIAGNOSIS — I872 Venous insufficiency (chronic) (peripheral): Secondary | ICD-10-CM

## 2015-05-19 DIAGNOSIS — F32A Depression, unspecified: Secondary | ICD-10-CM

## 2015-05-19 DIAGNOSIS — R1013 Epigastric pain: Secondary | ICD-10-CM | POA: Diagnosis not present

## 2015-05-19 NOTE — Assessment & Plan Note (Signed)
Controlled, continue Losartan 25mg  daily, Atenolol 20mg  bid, monitor blood pressure daily. Dr. Mare Ferrari 02/03/14 continue same cardiac meds.

## 2015-05-19 NOTE — Progress Notes (Signed)
Patient ID: [name], female   DOB: 23-Jul-1926, 81 y.o.   MRN: [id number]   Code Status: DNR  Allergies  Allergen Reactions  . Penicillins Other (See Comments)    Per MAR  . Bee Venom Other (See Comments)    Per MAR  . Celebrex [Celecoxib] Other (See Comments)    Per MAR  . Lidocaine Hcl Other (See Comments)    Per MAR  . Phenobarbital Other (See Comments)    Per MAR  . Procaine Hcl Other (See Comments)    Per mar    Chief Complaint  Patient presents with  . Medical Management of Chronic Issues  . Acute Visit    UTI    HPI: Patient is a 81 y.o. female seen in the SNF at [facility] today for evaluation of UTI and chronic medical conditions.  Problem List Items Addressed This Visit    Type 2 diabetes mellitus with diabetic neuropathy affecting both sides of body (Chronic)    Lantus 18units daily,  continue Novolog 5 u with meals for CBG>150 since 02/16/15. Last Hgb A1c 9.2 01/26/15        Ischemic heart disease    angina occasionally, f/u Cardiology, continue with Imdur 60mg  and Prn NTG(last dose 02/12/13) and ASA 81mg .         Dyspepsia    Stable, continue Omeprazole 20mg  daily.         Hypothyroidism    Corrected with Levothyroxine 112.20mcg. 04/27/15 TSH 2.663       Depression    Better with Cymbalta 30mg  bid and Namenda. Observe the patient.   06/24/14 dc Ativan: infrequently use(1 dose in 3 months)  mood is stable.         Essential tremor    Mild head titubation and hands tremor-not disabling. Takes Propranolol 10mg  bid         Back pain    Chronic-coccyalgia in nature, pain is controlled with Fentanyl 143mcg/hr        HTN (hypertension)    Controlled, continue Losartan 25mg  daily, Atenolol 20mg  bid, monitor blood pressure daily. Dr. Mare Ferrari 02/03/14 continue same cardiac meds.         Constipation    Stable, takes colace 100 II qhs and Linzess 148mcg (off Amitiza 29mcg bid-therapeutic exchange by Pharm )and MiraLax prn.        Falls frequently    05/17/15 fall, right temple bruise.       Pressure ulcer stage II    Chronic buttocks sedentary and moist from incontinent of bladder.        Edema    Chronic venous insufficiency. Takes Furosemide 40mg  bid and Spironolctone25mg  daily. Trace edema BLE.       A-fib    Heart rate is in control. Propranolol was increased to 20mg  bid for better rate control in setting of HTN and essential tremor. Saw Dr. Mare Ferrari 02/03/14 doing well, EKG A-fib with controlled VR. 1year office visit EKG       Dementia with behavioral disturbance    Gradual decline but adjusted to SNF well. Takes Namenda 28mg  daily.          Venous stasis dermatitis of both lower extremities    Chronic but stable, currently no open wounds or s/s of infection. Takes Claritin for itching        UTI (urinary tract infection) - Primary    04/22/14 Urine culture E Coli 7 day course of Cipro 500mg  bid.  10/01/14 Urine culture pos  nitrate, large leukocyte esterase, many bacteria, wbc 21-50. Empirical ABT Nitrofurantoinin 100mg  bid x 7 days started 11/181/5.  05/17/15 urine culture E Coli, 05/18/15 7 day course Cipro 250mg  bid      Erythrocytosis    Pending Hematology referral.          Review of Systems:  Review of Systems  Constitutional: Negative for fever, chills and diaphoresis.  HENT: Positive for hearing loss. Negative for congestion, ear discharge, ear pain, nosebleeds, sore throat and tinnitus.   Eyes: Negative for photophobia, pain, discharge and redness.  Respiratory: Negative for cough, shortness of breath, wheezing and stridor.   Cardiovascular: Positive for leg swelling. Negative for chest pain and palpitations.  Gastrointestinal: Negative for nausea, vomiting, abdominal pain, diarrhea, constipation and blood in stool.  Endocrine: Negative for polydipsia.  Genitourinary: Positive for frequency. Negative for dysuria, urgency, hematuria and flank pain.  Musculoskeletal: Positive for  back pain. Negative for myalgias and neck pain.  Skin: Positive for rash.       Excoriated buttocks with pressure ulcers L+R buttocks Bruised left temple.   Allergic/Immunologic: Negative for environmental allergies.  Neurological: Negative for dizziness, tremors, seizures, weakness and headaches.  Hematological: Does not bruise/bleed easily.  Psychiatric/Behavioral: Negative for suicidal ideas and hallucinations. The patient is nervous/anxious.      Past Medical History  Diagnosis Date  . Diabetes mellitus   . Hypertension   . Heart murmur   . Benign familial tremor   . Rosacea   . Diverticulitis   . Heart failure     diastolic heart failure  . TIA (transient ischemic attack) 2010  . Arthritis   . Breast cancer     breast -rt  . CAD (coronary artery disease)     single vessel CAD per cath in 2006; scattered nonobstructive disease in the left system, left to right collaterals to the RCA which was occluded by flush shots; she has been managed medically   Past Surgical History  Procedure Laterality Date  . Mastectomy partial / lumpectomy  11/02/2000    Right, with SLN  . Mastectomy partial / lumpectomy  05/15/2008    Left  . Breast lumpectomy  01/19/2010    right  . Mastectomy  03/31/2010    Right   Social History:   reports that she has never smoked. She does not have any smokeless tobacco history on file. She reports that she does not drink alcohol or use illicit drugs.  No family history on file.  Medications: Patient's Medications  New Prescriptions   No medications on file  Previous Medications   ACETAMINOPHEN (TYLENOL) 325 MG TABLET    Take 2 tablets (650 mg total) by mouth every 4 (four) hours as needed.   ALUM & MAG HYDROXIDE-SIMETH (MAALOX/MYLANTA) 200-200-20 MG/5ML SUSPENSION    Take 30 mLs by mouth every 6 (six) hours as needed for indigestion or heartburn.   ASPIRIN EC 81 MG TABLET    Take 81 mg by mouth daily.   AYR SALINE NASAL NO-DRIP GEL    Place into the  nose 2 (two) times daily as needed (nose bleeds).   DOCUSATE SODIUM (COLACE) 100 MG CAPSULE    Take 200 mg by mouth at bedtime.    DORZOLAMIDE-TIMOLOL (COSOPT) 22.3-6.8 MG/ML OPHTHALMIC SOLUTION    Place 1 drop into the left eye every 12 (twelve) hours.   DULOXETINE (CYMBALTA) 30 MG CAPSULE    Take 30 mg by mouth 2 (two) times daily.   FENTANYL ([place] - DOSED  MCG/HR) 100 MCG/HR    Apply one patch topically every 3 days for pain. Remove old patch before applying new patch.   FUROSEMIDE (LASIX) 40 MG TABLET    Take 40 mg by mouth 2 (two) times daily.    GLUCAGON (GLUCAGEN) 1 MG SOLR INJECTION    Inject 1 mg into the vein once as needed for low blood sugar.   GLUCOSAMINE-CHONDROITIN 500-400 MG TABLET    Take 1 tablet by mouth 3 (three) times daily.   GUAIFENESIN (ROBITUSSIN) 100 MG/5ML SYRUP    Take 300 mg by mouth 3 (three) times daily as needed for cough.   HYDROXYZINE (ATARAX/VISTARIL) 25 MG TABLET    Take 25 mg by mouth at bedtime as needed. For itching   INSULIN ASPART (NOVOLOG) 100 UNIT/ML INJECTION    Inject 5 Units into the skin 3 (three) times daily with meals. 8 units If CBG is greater than 150 at lunch and 12 units if CBG is greater than 150 at breakfast and dinner   INSULIN GLARGINE (LANTUS) 100 UNIT/ML INJECTION    Inject 18 Units into the skin at bedtime.    ISOSORBIDE MONONITRATE (IMDUR) 60 MG 24 HR TABLET    Take 60 mg by mouth every morning.    LEVOTHYROXINE (SYNTHROID, LEVOTHROID) 75 MCG TABLET    Take 75-112.5 mcg by mouth daily. Take 1 and 1/2 tablets on Saturdays then 1 tablet all other days   LINZESS 145 MCG CAPS CAPSULE    Take 145 mcg by mouth daily. Take 30 mins prior to breakfast on an empty stomach.   LORATADINE (CLARITIN) 10 MG TABLET    Take 10 mg by mouth daily.    LOSARTAN (COZAAR) 25 MG TABLET    Take 25 mg by mouth daily.   LUBIPROSTONE (AMITIZA) 24 MCG CAPSULE    Take 24 mcg by mouth 2 (two) times daily with a meal.     MEMANTINE HCL ER (NAMENDA XR) 28 MG CP24     Take by mouth.   METHYLCELLULOSE (ARTIFICIAL TEARS) 1 % OPHTHALMIC SOLUTION    Place 1 drop into both eyes 4 (four) times daily as needed (for dryness).   MULTIPLE VITAMINS-MINERALS (CERTAVITE SENIOR/ANTIOXIDANT PO)    Take 1 tablet by mouth daily.   NITROGLYCERIN (NITROSTAT) 0.4 MG SL TABLET    Place 0.4 mg under the tongue every 5 (five) minutes as needed. For chest pain   NUTRITIONAL SUPPLEMENTS (RESOURCE 2.0 PO)    Take 120 mLs by mouth 3 (three) times daily.   OMEPRAZOLE (PRILOSEC) 20 MG CAPSULE    Take 20 mg by mouth daily.    PROBIOTIC PRODUCT (ALIGN) 4 MG CAPS    Take 1 capsule by mouth daily.   PROPRANOLOL (INDERAL) 20 MG TABLET    Take 20 mg by mouth 2 (two) times daily.   SPIRONOLACTONE (ALDACTONE) 25 MG TABLET    Take 25 mg by mouth daily.    VITAMIN D, ERGOCALCIFEROL, (DRISDOL) 50000 UNITS CAPS    Take 50,000 Units by mouth every 30 (thirty) days. On the first of the month  Modified Medications   No medications on file  Discontinued Medications   No medications on file     Physical Exam: Physical Exam  Constitutional: She appears well-developed.  HENT:  Head: Normocephalic and atraumatic.  Left Ear: External ear normal.  Eyes: Conjunctivae and EOM are normal. Pupils are equal, round, and reactive to light.  Neck: Neck supple. No JVD present. No thyromegaly present.  Cardiovascular:  Normal rate and regular rhythm.   Murmur heard. Pulmonary/Chest: Effort normal. She has rales.  Bibasilar rales.   Abdominal: Soft. There is no tenderness. There is no rebound.  Musculoskeletal: She exhibits edema and tenderness.  Back pain is chronic which is well controlled. BLE chronic edema trace only. Left foot external deviated deformity.   Lymphadenopathy:    She has no cervical adenopathy.  Neurological: She is alert. No cranial nerve deficit. Coordination normal.  Skin: Skin is warm and dry. Rash noted.  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 R+L Intergluteal cleft recurrent pressure  uinjuries in mirror image  Left forefoot diabetic ulcer--healed. Excoriated urogenital/buttocks  Excoriated buttocks with pressure ulcers L+R buttocks Bruised left temple.     Psychiatric: Her mood appears anxious. Her affect is not blunt, not labile and not inappropriate. Her speech is rapid and/or pressured (occasionally ). Her speech is not delayed and not slurred. She is hyperactive. She is not agitated, not aggressive, not slowed, not withdrawn, not actively hallucinating and not combative. Thought content is not paranoid and not delusional. She does not exhibit a depressed mood. She exhibits abnormal recent memory.  MMSE 21/30 08/2013   Filed Vitals:   05/19/15 1513  BP: 143/86  Pulse: 94  Temp: 98.2 F (36.8 C)  TempSrc: Tympanic  Resp: 20      Labs reviewed: Basic Metabolic Panel:  Recent Labs  07/03/14 10/01/14 01/29/15 04/27/15  NA 137 136*  --  137  K 4.2 4.9  --  3.7  BUN 26* 35*  --  28*  CREATININE 0.8 0.9  --  0.9  TSH 3.77  --  3.01 2.66   Liver Function Tests:  Recent Labs  10/01/14 04/27/15  AST 20 17  ALT 13 14  ALKPHOS 80 101   No results for input(s): LIPASE, AMYLASE in the last 8760 hours. No results for input(s): AMMONIA in the last 8760 hours. CBC:  Recent Labs  10/01/14 04/27/15 04/30/15  WBC 6.6 6.8 6.4  HGB 14.5 17.3* 17.5*  HCT 43 51* 51*  PLT 229 220 208   Lipid Panel: No results for input(s): CHOL, HDL, LDLCALC, TRIG, CHOLHDL, LDLDIRECT in the last 8760 hours.  Past Procedures:  10/22/13 CXR patchy interstitial changes left lower lung most consistent with pneumonia  11/21/13 CXR minimal cardiomegaly slightly increased without pulmonary vascular congestion or pleural effusion, no inflammatory consolidate or suspicious nodule, interval resolution of interstitial pneumonitis at the left lower lung.   01/29/14 for minor head injury and dementia: head CT w/o contrast: no acute findings.   04/18/14 X-ray L knee/ankle/tibia/fibula: no acute  fracture or dislocation  07/18/14 X-ray L foot: the findings could be consistent with inflammatory process or osteomyelitis. Neuropathic arthropathy not excluded.   09/30/14 X-ray R rib: negative for appreciable right rib fracture   Assessment/Plan UTI (urinary tract infection) 04/22/14 Urine culture E Coli 7 day course of Cipro 500mg  bid.  10/01/14 Urine culture pos nitrate, large leukocyte esterase, many bacteria, wbc 21-50. Empirical ABT Nitrofurantoinin 100mg  bid x 7 days started 11/181/5.  05/17/15 urine culture E Coli, 05/18/15 7 day course Cipro 250mg  bid  Falls frequently 05/17/15 fall, right temple bruise.   Pressure ulcer stage II Chronic buttocks sedentary and moist from incontinent of bladder.    Type 2 diabetes mellitus with diabetic neuropathy affecting both sides of body Lantus 18units daily,  continue Novolog 5 u with meals for CBG>150 since 02/16/15. Last Hgb A1c 9.2 01/26/15    Ischemic heart disease angina  occasionally, f/u Cardiology, continue with Imdur 60mg  and Prn NTG(last dose 02/12/13) and ASA 81mg .     Dyspepsia Stable, continue Omeprazole 20mg  daily.     Hypothyroidism Corrected with Levothyroxine 112.52mcg. 04/27/15 TSH 2.663   Depression Better with Cymbalta 30mg  bid and Namenda. Observe the patient.   06/24/14 dc Ativan: infrequently use(1 dose in 3 months)  mood is stable.     Essential tremor Mild head titubation and hands tremor-not disabling. Takes Propranolol 10mg  bid     Back pain Chronic-coccyalgia in nature, pain is controlled with Fentanyl 110mcg/hr    HTN (hypertension) Controlled, continue Losartan 25mg  daily, Atenolol 20mg  bid, monitor blood pressure daily. Dr. Mare Ferrari 02/03/14 continue same cardiac meds.     Constipation Stable, takes colace 100 II qhs and Linzess 116mcg (off Amitiza 53mcg bid-therapeutic exchange by Pharm )and MiraLax prn.    Edema Chronic venous insufficiency. Takes Furosemide 40mg  bid and  Spironolctone25mg  daily. Trace edema BLE.   A-fib Heart rate is in control. Propranolol was increased to 20mg  bid for better rate control in setting of HTN and essential tremor. Saw Dr. Mare Ferrari 02/03/14 doing well, EKG A-fib with controlled VR. 1year office visit EKG   Dementia with behavioral disturbance Gradual decline but adjusted to SNF well. Takes Namenda 28mg  daily.      Venous stasis dermatitis of both lower extremities Chronic but stable, currently no open wounds or s/s of infection. Takes Claritin for itching    Erythrocytosis Pending Hematology referral.     Family/ Staff Communication: observe the patient. Hematology referral when patient desires.   Goals of Care: SNF  Labs/tests ordered: none

## 2015-05-19 NOTE — Assessment & Plan Note (Signed)
Stable, takes colace 100 II qhs and Linzess 19mcg (off Amitiza 64mcg bid-therapeutic exchange by Pharm )and MiraLax prn.

## 2015-05-19 NOTE — Assessment & Plan Note (Signed)
Chronic but stable, currently no open wounds or s/s of infection. Takes Claritin for itching

## 2015-05-19 NOTE — Assessment & Plan Note (Signed)
Heart rate is in control. Propranolol was increased to 20mg  bid for better rate control in setting of HTN and essential tremor. Saw Dr. Mare Ferrari 02/03/14 doing well, EKG A-fib with controlled VR. 1year office visit EKG

## 2015-05-19 NOTE — Assessment & Plan Note (Signed)
Chronic-coccyalgia in nature, pain is controlled with Fentanyl 137mcg/hr

## 2015-05-19 NOTE — Assessment & Plan Note (Signed)
Chronic venous insufficiency. Takes Furosemide 40mg  bid and Spironolctone25mg  daily. Trace edema BLE.

## 2015-05-19 NOTE — Assessment & Plan Note (Signed)
Stable, continue Omeprazole 20mg daily.  

## 2015-05-19 NOTE — Assessment & Plan Note (Signed)
Pending Hematology referral.

## 2015-05-19 NOTE — Assessment & Plan Note (Signed)
angina occasionally, f/u Cardiology, continue with Imdur 60mg  and Prn NTG(last dose 02/12/13) and ASA 81mg .

## 2015-05-19 NOTE — Assessment & Plan Note (Signed)
Mild head titubation and hands tremor-not disabling. Takes Propranolol 10mg  bid

## 2015-05-19 NOTE — Assessment & Plan Note (Signed)
Gradual decline but adjusted to SNF well. Takes Namenda 28mg  daily.

## 2015-05-19 NOTE — Assessment & Plan Note (Signed)
Better with Cymbalta 30mg  bid and Namenda. Observe the patient.   06/24/14 dc Ativan: infrequently use(1 dose in 3 months)  mood is stable.

## 2015-05-19 NOTE — Assessment & Plan Note (Signed)
05/17/15 fall, right temple bruise.

## 2015-05-19 NOTE — Assessment & Plan Note (Signed)
04/22/14 Urine culture E Coli 7 day course of Cipro 500mg  bid.  10/01/14 Urine culture pos nitrate, large leukocyte esterase, many bacteria, wbc 21-50. Empirical ABT Nitrofurantoinin 100mg  bid x 7 days started 11/181/5.  05/17/15 urine culture E Coli, 05/18/15 7 day course Cipro 250mg  bid

## 2015-05-19 NOTE — Assessment & Plan Note (Signed)
Corrected with Levothyroxine 112.59mcg. 04/27/15 TSH 2.663

## 2015-05-19 NOTE — Assessment & Plan Note (Signed)
Lantus 18units daily,  continue Novolog 5 u with meals for CBG>150 since 02/16/15. Last Hgb A1c 9.2 01/26/15

## 2015-05-19 NOTE — Assessment & Plan Note (Signed)
Chronic buttocks sedentary and moist from incontinent of bladder.

## 2015-05-26 ENCOUNTER — Encounter: Payer: Self-pay | Admitting: Nurse Practitioner

## 2015-05-26 ENCOUNTER — Non-Acute Institutional Stay (SKILLED_NURSING_FACILITY): Payer: Medicare Other | Admitting: Nurse Practitioner

## 2015-05-26 DIAGNOSIS — F329 Major depressive disorder, single episode, unspecified: Secondary | ICD-10-CM | POA: Diagnosis not present

## 2015-05-26 DIAGNOSIS — I10 Essential (primary) hypertension: Secondary | ICD-10-CM

## 2015-05-26 DIAGNOSIS — I8312 Varicose veins of left lower extremity with inflammation: Secondary | ICD-10-CM

## 2015-05-26 DIAGNOSIS — E039 Hypothyroidism, unspecified: Secondary | ICD-10-CM | POA: Diagnosis not present

## 2015-05-26 DIAGNOSIS — I872 Venous insufficiency (chronic) (peripheral): Secondary | ICD-10-CM

## 2015-05-26 DIAGNOSIS — L8992 Pressure ulcer of unspecified site, stage 2: Secondary | ICD-10-CM | POA: Diagnosis not present

## 2015-05-26 DIAGNOSIS — K5900 Constipation, unspecified: Secondary | ICD-10-CM | POA: Diagnosis not present

## 2015-05-26 DIAGNOSIS — G250 Essential tremor: Secondary | ICD-10-CM

## 2015-05-26 DIAGNOSIS — B372 Candidiasis of skin and nail: Secondary | ICD-10-CM

## 2015-05-26 DIAGNOSIS — F0391 Unspecified dementia with behavioral disturbance: Secondary | ICD-10-CM

## 2015-05-26 DIAGNOSIS — I259 Chronic ischemic heart disease, unspecified: Secondary | ICD-10-CM

## 2015-05-26 DIAGNOSIS — I8311 Varicose veins of right lower extremity with inflammation: Secondary | ICD-10-CM

## 2015-05-26 DIAGNOSIS — E1142 Type 2 diabetes mellitus with diabetic polyneuropathy: Secondary | ICD-10-CM

## 2015-05-26 DIAGNOSIS — R1013 Epigastric pain: Secondary | ICD-10-CM

## 2015-05-26 DIAGNOSIS — I482 Chronic atrial fibrillation, unspecified: Secondary | ICD-10-CM

## 2015-05-26 DIAGNOSIS — R609 Edema, unspecified: Secondary | ICD-10-CM | POA: Diagnosis not present

## 2015-05-26 DIAGNOSIS — F03918 Unspecified dementia, unspecified severity, with other behavioral disturbance: Secondary | ICD-10-CM

## 2015-05-26 DIAGNOSIS — F32A Depression, unspecified: Secondary | ICD-10-CM

## 2015-05-26 DIAGNOSIS — M5440 Lumbago with sciatica, unspecified side: Secondary | ICD-10-CM

## 2015-05-26 NOTE — Progress Notes (Signed)
Patient ID: [name], female   DOB: [birth year], 81 y.o.   MRN: [id number]   Code Status: DNR  Allergies  Allergen Reactions  . Penicillins Other (See Comments)    Per MAR  . Bee Venom Other (See Comments)    Per MAR  . Celebrex [Celecoxib] Other (See Comments)    Per MAR  . Lidocaine Hcl Other (See Comments)    Per MAR  . Phenobarbital Other (See Comments)    Per MAR  . Procaine Hcl Other (See Comments)    Per mar    Chief Complaint  Patient presents with  . Medical Management of Chronic Issues  . Acute Visit    chest rash    HPI: Patient is a 81 y.o. female seen in the SNF at [facility] today for evaluation of chest rash and chronic medical conditions.  Problem List Items Addressed This Visit    Type 2 diabetes mellitus with diabetic neuropathy affecting both sides of body (Chronic)    Lantus 18units daily,  continue Novolog 5 u with meals for CBG>150 since 02/16/15. Last Hgb A1c 9.6 04/27/15        Ischemic heart disease    angina occasionally, f/u Cardiology, continue with Imdur 60mg  and Prn NTG(last dose 02/12/13) and ASA 81mg .        Dyspepsia    Stable, continue Omeprazole 20mg  daily.          Hypothyroidism    Corrected with Levothyroxine 112.21mcg. 04/27/15 TSH 2.663        Depression    Better with Cymbalta 30mg  bid and Namenda. Observe the patient.   06/24/14 dc Ativan: infrequently use(1 dose in 3 months)  mood is stable.        Essential tremor    Mild head titubation and hands tremor-not disabling. Takes Propranolol 10mg  bid        Back pain    Chronic-coccyalgia in nature, pain is controlled with Fentanyl 171mcg/hr         HTN (hypertension)    Controlled, continue Losartan 25mg  daily, Atenolol 20mg  bid, monitor blood pressure daily. Dr. Mare Ferrari 02/03/14 continue same cardiac meds.          Constipation    Stable, takes colace 100 II qhs and Linzess 162mcg (off Amitiza 65mcg bid-therapeutic exchange by Pharm )and MiraLax  prn.        Pressure ulcer stage II    Chronic buttocks sedentary and moist from incontinent of bladder.         Edema    Chronic venous insufficiency. Takes Furosemide 40mg  bid and Spironolctone25mg  daily. Trace edema BLE.        A-fib    Heart rate is in control. Propranolol was increased to 20mg  bid for better rate control in setting of HTN and essential tremor. Saw Dr. Mare Ferrari 02/03/14 doing well, EKG A-fib with controlled VR. 1year office visit EKG        Dementia with behavioral disturbance    Gradual decline but adjusted to SNF well. Takes Namenda 28mg  daily.         Candidiasis of skin - Primary    Chest, under breast skin folds satellite pattern rash-Nystatin powder bid, Diflucan 100mg  daily x 3 days. Observe.       Venous stasis dermatitis of both lower extremities    Chronic but stable, currently no open wounds or s/s of infection. Takes Claritin for itching            Review  of Systems:  Review of Systems  Constitutional: Negative for fever, chills and diaphoresis.  HENT: Positive for hearing loss. Negative for congestion, ear discharge, ear pain, nosebleeds, sore throat and tinnitus.   Eyes: Negative for photophobia, pain, discharge and redness.  Respiratory: Negative for cough, shortness of breath, wheezing and stridor.   Cardiovascular: Positive for leg swelling. Negative for chest pain and palpitations.  Gastrointestinal: Negative for nausea, vomiting, abdominal pain, diarrhea, constipation and blood in stool.  Endocrine: Negative for polydipsia.  Genitourinary: Positive for frequency. Negative for dysuria, urgency, hematuria and flank pain.  Musculoskeletal: Positive for back pain. Negative for myalgias and neck pain.  Skin: Positive for rash.       Excoriated buttocks with pressure ulcers L+R buttocks Bruised left temple.  Chest satellite pattern rash  Allergic/Immunologic: Negative for environmental allergies.  Neurological: Negative for  dizziness, tremors, seizures, weakness and headaches.  Hematological: Does not bruise/bleed easily.  Psychiatric/Behavioral: Negative for suicidal ideas and hallucinations. The patient is nervous/anxious.      Past Medical History  Diagnosis Date  . Diabetes mellitus   . Hypertension   . Heart murmur   . Benign familial tremor   . Rosacea   . Diverticulitis   . Heart failure     diastolic heart failure  . TIA (transient ischemic attack) 2010  . Arthritis   . Breast cancer     breast -rt  . CAD (coronary artery disease)     single vessel CAD per cath in 2006; scattered nonobstructive disease in the left system, left to right collaterals to the RCA which was occluded by flush shots; she has been managed medically   Past Surgical History  Procedure Laterality Date  . Mastectomy partial / lumpectomy  11/02/2000    Right, with SLN  . Mastectomy partial / lumpectomy  05/15/2008    Left  . Breast lumpectomy  01/19/2010    right  . Mastectomy  03/31/2010    Right   Social History:   reports that she has never smoked. She does not have any smokeless tobacco history on file. She reports that she does not drink alcohol or use illicit drugs.  No family history on file.  Medications: Patient's Medications  New Prescriptions   No medications on file  Previous Medications   ACETAMINOPHEN (TYLENOL) 325 MG TABLET    Take 2 tablets (650 mg total) by mouth every 4 (four) hours as needed.   ALUM & MAG HYDROXIDE-SIMETH (MAALOX/MYLANTA) 200-200-20 MG/5ML SUSPENSION    Take 30 mLs by mouth every 6 (six) hours as needed for indigestion or heartburn.   ASPIRIN EC 81 MG TABLET    Take 81 mg by mouth daily.   AYR SALINE NASAL NO-DRIP GEL    Place into the nose 2 (two) times daily as needed (nose bleeds).   DOCUSATE SODIUM (COLACE) 100 MG CAPSULE    Take 200 mg by mouth at bedtime.    DORZOLAMIDE-TIMOLOL (COSOPT) 22.3-6.8 MG/ML OPHTHALMIC SOLUTION    Place 1 drop into the left eye every 12 (twelve)  hours.   DULOXETINE (CYMBALTA) 30 MG CAPSULE    Take 30 mg by mouth 2 (two) times daily.   FENTANYL (DURAGESIC - DOSED MCG/HR) 100 MCG/HR    Apply one patch topically every 3 days for pain. Remove old patch before applying new patch.   FUROSEMIDE (LASIX) 40 MG TABLET    Take 40 mg by mouth 2 (two) times daily.    GLUCAGON (GLUCAGEN) 1 MG SOLR  INJECTION    Inject 1 mg into the vein once as needed for low blood sugar.   GLUCOSAMINE-CHONDROITIN 500-400 MG TABLET    Take 1 tablet by mouth 3 (three) times daily.   GUAIFENESIN (ROBITUSSIN) 100 MG/5ML SYRUP    Take 300 mg by mouth 3 (three) times daily as needed for cough.   HYDROXYZINE (ATARAX/VISTARIL) 25 MG TABLET    Take 25 mg by mouth at bedtime as needed. For itching   INSULIN ASPART (NOVOLOG) 100 UNIT/ML INJECTION    Inject 5 Units into the skin 3 (three) times daily with meals. 8 units If CBG is greater than 150 at lunch and 12 units if CBG is greater than 150 at breakfast and dinner   INSULIN GLARGINE (LANTUS) 100 UNIT/ML INJECTION    Inject 18 Units into the skin at bedtime.    ISOSORBIDE MONONITRATE (IMDUR) 60 MG 24 HR TABLET    Take 60 mg by mouth every morning.    LEVOTHYROXINE (SYNTHROID, LEVOTHROID) 75 MCG TABLET    Take 75-112.5 mcg by mouth daily. Take 1 and 1/2 tablets on Saturdays then 1 tablet all other days   LINZESS 145 MCG CAPS CAPSULE    Take 145 mcg by mouth daily. Take 30 mins prior to breakfast on an empty stomach.   LORATADINE (CLARITIN) 10 MG TABLET    Take 10 mg by mouth daily.    LOSARTAN (COZAAR) 25 MG TABLET    Take 25 mg by mouth daily.   LUBIPROSTONE (AMITIZA) 24 MCG CAPSULE    Take 24 mcg by mouth 2 (two) times daily with a meal.     MEMANTINE HCL ER (NAMENDA XR) 28 MG CP24    Take by mouth.   METHYLCELLULOSE (ARTIFICIAL TEARS) 1 % OPHTHALMIC SOLUTION    Place 1 drop into both eyes 4 (four) times daily as needed (for dryness).   MULTIPLE VITAMINS-MINERALS (CERTAVITE SENIOR/ANTIOXIDANT PO)    Take 1 tablet by mouth  daily.   NITROGLYCERIN (NITROSTAT) 0.4 MG SL TABLET    Place 0.4 mg under the tongue every 5 (five) minutes as needed. For chest pain   NUTRITIONAL SUPPLEMENTS (RESOURCE 2.0 PO)    Take 120 mLs by mouth 3 (three) times daily.   OMEPRAZOLE (PRILOSEC) 20 MG CAPSULE    Take 20 mg by mouth daily.    PROBIOTIC PRODUCT (ALIGN) 4 MG CAPS    Take 1 capsule by mouth daily.   PROPRANOLOL (INDERAL) 20 MG TABLET    Take 20 mg by mouth 2 (two) times daily.   SPIRONOLACTONE (ALDACTONE) 25 MG TABLET    Take 25 mg by mouth daily.    VITAMIN D, ERGOCALCIFEROL, (DRISDOL) 50000 UNITS CAPS    Take 50,000 Units by mouth every 30 (thirty) days. On the first of the month  Modified Medications   No medications on file  Discontinued Medications   No medications on file     Physical Exam: Physical Exam  Constitutional: She appears well-developed.  HENT:  Head: Normocephalic and atraumatic.  Left Ear: External ear normal.  Eyes: Conjunctivae and EOM are normal. Pupils are equal, round, and reactive to light.  Neck: Neck supple. No JVD present. No thyromegaly present.  Cardiovascular: Normal rate and regular rhythm.   Murmur heard. Pulmonary/Chest: Effort normal. She has rales.  Bibasilar rales.   Abdominal: Soft. There is no tenderness. There is no rebound.  Musculoskeletal: She exhibits edema and tenderness.  Back pain is chronic which is well controlled. BLE chronic edema  trace only. Left foot external deviated deformity.   Lymphadenopathy:    She has no cervical adenopathy.  Neurological: She is alert. No cranial nerve deficit. Coordination normal.  Skin: Skin is warm and dry. Rash noted.  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 R+L Intergluteal cleft recurrent pressure uinjuries in mirror image  Left forefoot diabetic ulcer--healed. Excoriated urogenital/buttocks  Excoriated buttocks with pressure ulcers L+R buttocks Bruised left temple.  New onset satellite pattern rash    Psychiatric: Her mood appears  anxious. Her affect is not blunt, not labile and not inappropriate. Her speech is rapid and/or pressured (occasionally ). Her speech is not delayed and not slurred. She is hyperactive. She is not agitated, not aggressive, not slowed, not withdrawn, not actively hallucinating and not combative. Thought content is not paranoid and not delusional. She does not exhibit a depressed mood. She exhibits abnormal recent memory.  MMSE 21/30 08/2013   Filed Vitals:   05/26/15 1608  BP: 122/70  Pulse: 76  Temp: 98 F (36.7 C)  TempSrc: Tympanic  Resp: 20      Labs reviewed: Basic Metabolic Panel:  Recent Labs  07/03/14 10/01/14 01/29/15 04/27/15  NA 137 136*  --  137  K 4.2 4.9  --  3.7  BUN 26* 35*  --  28*  CREATININE 0.8 0.9  --  0.9  TSH 3.77  --  3.01 2.66   Liver Function Tests:  Recent Labs  10/01/14 04/27/15  AST 20 17  ALT 13 14  ALKPHOS 80 101   No results for input(s): LIPASE, AMYLASE in the last 8760 hours. No results for input(s): AMMONIA in the last 8760 hours. CBC:  Recent Labs  10/01/14 04/27/15 04/30/15  WBC 6.6 6.8 6.4  HGB 14.5 17.3* 17.5*  HCT 43 51* 51*  PLT 229 220 208   Lipid Panel: No results for input(s): CHOL, HDL, LDLCALC, TRIG, CHOLHDL, LDLDIRECT in the last 8760 hours.  Past Procedures:  10/22/13 CXR patchy interstitial changes left lower lung most consistent with pneumonia  11/21/13 CXR minimal cardiomegaly slightly increased without pulmonary vascular congestion or pleural effusion, no inflammatory consolidate or suspicious nodule, interval resolution of interstitial pneumonitis at the left lower lung.   01/29/14 for minor head injury and dementia: head CT w/o contrast: no acute findings.   04/18/14 X-ray L knee/ankle/tibia/fibula: no acute fracture or dislocation  07/18/14 X-ray L foot: the findings could be consistent with inflammatory process or osteomyelitis. Neuropathic arthropathy not excluded.   09/30/14 X-ray R rib: negative for  appreciable right rib fracture   Assessment/Plan Candidiasis of skin Chest, under breast skin folds satellite pattern rash-Nystatin powder bid, Diflucan 100mg  daily x 3 days. Observe.   Type 2 diabetes mellitus with diabetic neuropathy affecting both sides of body Lantus 18units daily,  continue Novolog 5 u with meals for CBG>150 since 02/16/15. Last Hgb A1c 9.6 04/27/15    Ischemic heart disease angina occasionally, f/u Cardiology, continue with Imdur 60mg  and Prn NTG(last dose 02/12/13) and ASA 81mg .    Dyspepsia Stable, continue Omeprazole 20mg  daily.      Hypothyroidism Corrected with Levothyroxine 112.75mcg. 04/27/15 TSH 2.663    Depression Better with Cymbalta 30mg  bid and Namenda. Observe the patient.   06/24/14 dc Ativan: infrequently use(1 dose in 3 months)  mood is stable.    Essential tremor Mild head titubation and hands tremor-not disabling. Takes Propranolol 10mg  bid    Back pain Chronic-coccyalgia in nature, pain is controlled with Fentanyl 165mcg/hr  HTN (hypertension) Controlled, continue Losartan 25mg  daily, Atenolol 20mg  bid, monitor blood pressure daily. Dr. Mare Ferrari 02/03/14 continue same cardiac meds.      Constipation Stable, takes colace 100 II qhs and Linzess 189mcg (off Amitiza 3mcg bid-therapeutic exchange by Pharm )and MiraLax prn.    Pressure ulcer stage II Chronic buttocks sedentary and moist from incontinent of bladder.     Edema Chronic venous insufficiency. Takes Furosemide 40mg  bid and Spironolctone25mg  daily. Trace edema BLE.    A-fib Heart rate is in control. Propranolol was increased to 20mg  bid for better rate control in setting of HTN and essential tremor. Saw Dr. Mare Ferrari 02/03/14 doing well, EKG A-fib with controlled VR. 1year office visit EKG    Dementia with behavioral disturbance Gradual decline but adjusted to SNF well. Takes Namenda 28mg  daily.     Venous stasis dermatitis of both lower  extremities Chronic but stable, currently no open wounds or s/s of infection. Takes Claritin for itching       Family/ Staff Communication: observe the patient. Hematology referral when patient desires.   Goals of Care: SNF  Labs/tests ordered: none

## 2015-05-26 NOTE — Assessment & Plan Note (Signed)
Chronic but stable, currently no open wounds or s/s of infection. Takes Claritin for itching

## 2015-05-26 NOTE — Assessment & Plan Note (Signed)
angina occasionally, f/u Cardiology, continue with Imdur 60mg  and Prn NTG(last dose 02/12/13) and ASA 81mg .

## 2015-05-26 NOTE — Assessment & Plan Note (Signed)
Gradual decline but adjusted to SNF well. Takes Namenda 28mg  daily.

## 2015-05-26 NOTE — Assessment & Plan Note (Signed)
Better with Cymbalta 30mg  bid and Namenda. Observe the patient.   06/24/14 dc Ativan: infrequently use(1 dose in 3 months)  mood is stable.

## 2015-05-26 NOTE — Assessment & Plan Note (Signed)
Lantus 18units daily,  continue Novolog 5 u with meals for CBG>150 since 02/16/15. Last Hgb A1c 9.6 04/27/15

## 2015-05-26 NOTE — Assessment & Plan Note (Signed)
Chronic-coccyalgia in nature, pain is controlled with Fentanyl 140mcg/hr

## 2015-05-26 NOTE — Assessment & Plan Note (Signed)
Chronic buttocks sedentary and moist from incontinent of bladder.

## 2015-05-26 NOTE — Assessment & Plan Note (Signed)
Stable, continue Omeprazole 20mg daily.  

## 2015-05-26 NOTE — Assessment & Plan Note (Signed)
Chest, under breast skin folds satellite pattern rash-Nystatin powder bid, Diflucan 100mg  daily x 3 days. Observe.

## 2015-05-26 NOTE — Assessment & Plan Note (Signed)
Controlled, continue Losartan 25mg  daily, Atenolol 20mg  bid, monitor blood pressure daily. Dr. Mare Ferrari 02/03/14 continue same cardiac meds.

## 2015-05-26 NOTE — Assessment & Plan Note (Signed)
Heart rate is in control. Propranolol was increased to 20mg  bid for better rate control in setting of HTN and essential tremor. Saw Dr. Mare Ferrari 02/03/14 doing well, EKG A-fib with controlled VR. 1year office visit EKG

## 2015-05-26 NOTE — Assessment & Plan Note (Signed)
Stable, takes colace 100 II qhs and Linzess 123mcg (off Amitiza 32mcg bid-therapeutic exchange by Pharm )and MiraLax prn.

## 2015-05-26 NOTE — Assessment & Plan Note (Signed)
Corrected with Levothyroxine 112.17mcg. 04/27/15 TSH 2.663

## 2015-05-26 NOTE — Assessment & Plan Note (Signed)
Chronic venous insufficiency. Takes Furosemide 40mg  bid and Spironolctone25mg  daily. Trace edema BLE.

## 2015-05-26 NOTE — Assessment & Plan Note (Signed)
Mild head titubation and hands tremor-not disabling. Takes Propranolol 10mg  bid

## 2015-05-28 ENCOUNTER — Encounter: Payer: Self-pay | Admitting: Internal Medicine

## 2015-05-28 ENCOUNTER — Non-Acute Institutional Stay (SKILLED_NURSING_FACILITY): Payer: Medicare Other | Admitting: Internal Medicine

## 2015-05-28 DIAGNOSIS — I8312 Varicose veins of left lower extremity with inflammation: Secondary | ICD-10-CM

## 2015-05-28 DIAGNOSIS — R609 Edema, unspecified: Secondary | ICD-10-CM

## 2015-05-28 DIAGNOSIS — I8311 Varicose veins of right lower extremity with inflammation: Secondary | ICD-10-CM

## 2015-05-28 DIAGNOSIS — I10 Essential (primary) hypertension: Secondary | ICD-10-CM

## 2015-05-28 DIAGNOSIS — N390 Urinary tract infection, site not specified: Secondary | ICD-10-CM | POA: Diagnosis not present

## 2015-05-28 DIAGNOSIS — R296 Repeated falls: Secondary | ICD-10-CM

## 2015-05-28 DIAGNOSIS — F0391 Unspecified dementia with behavioral disturbance: Secondary | ICD-10-CM | POA: Diagnosis not present

## 2015-05-28 DIAGNOSIS — I482 Chronic atrial fibrillation, unspecified: Secondary | ICD-10-CM

## 2015-05-28 DIAGNOSIS — I798 Other disorders of arteries, arterioles and capillaries in diseases classified elsewhere: Secondary | ICD-10-CM

## 2015-05-28 DIAGNOSIS — E1142 Type 2 diabetes mellitus with diabetic polyneuropathy: Secondary | ICD-10-CM

## 2015-05-28 DIAGNOSIS — E1151 Type 2 diabetes mellitus with diabetic peripheral angiopathy without gangrene: Secondary | ICD-10-CM

## 2015-05-28 DIAGNOSIS — E039 Hypothyroidism, unspecified: Secondary | ICD-10-CM | POA: Diagnosis not present

## 2015-05-28 DIAGNOSIS — I872 Venous insufficiency (chronic) (peripheral): Secondary | ICD-10-CM

## 2015-05-28 DIAGNOSIS — F03918 Unspecified dementia, unspecified severity, with other behavioral disturbance: Secondary | ICD-10-CM

## 2015-05-28 NOTE — Progress Notes (Signed)
Patient ID: [name], female   DOB: [birth year], 81 y.o.   MRN: [id number]    Circle Room Number: N 2  Place of Service: SNF (31)     Allergies  Allergen Reactions  . Penicillins Other (See Comments)    Per MAR  . Bee Venom Other (See Comments)    Per MAR  . Celebrex [Celecoxib] Other (See Comments)    Per MAR  . Lidocaine Hcl Other (See Comments)    Per MAR  . Phenobarbital Other (See Comments)    Per MAR  . Procaine Hcl Other (See Comments)    Per mar    Chief Complaint  Patient presents with  . Medical Management of Chronic Issues    HPI:  Urinary tract infection without hematuria, site unspecified: Recurrent urinary tract infections. Latest UTI 05/17/2015 was with Escherichia coli. Treated with Cipro 250 mg twice daily 7 days. 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 A1c 9.6 on 04/27/2015.  Controlled type 2 DM with peripheral circulatory disorder: Poor control  Chronic atrial fibrillation: Rate controlled. Not anticoagulated due to multiple falls.  Dementia with behavioral disturbance: Slowly progressive and basically unchanged for the last few months.  Edema: Chronic problem related to venous insufficiency.  Falls frequently: Most recent fall resulted in injury to the right temple area. No visual disturbances.  Essential hypertension: Controlled  Hypothyroidism, unspecified hypothyroidism type: Compensated  Venous stasis dermatitis of both lower extremities: Chronic condition with multiple episodes of breakdown of the skin. Currently doing fairly well.    Medications: Patient's Medications  New Prescriptions   No medications on file  Previous Medications   ACETAMINOPHEN (TYLENOL) 325 MG TABLET    Take 2 tablets (650 mg total) by mouth every 4 (four) hours as needed.   ALUM & MAG HYDROXIDE-SIMETH (MAALOX/MYLANTA) 200-200-20 MG/5ML SUSPENSION     Take 30 mLs by mouth every 6 (six) hours as needed for indigestion or heartburn.   ASPIRIN EC 81 MG TABLET    Take 81 mg by mouth daily.   AYR SALINE NASAL NO-DRIP GEL    Place into the nose 2 (two) times daily as needed (nose bleeds).   DOCUSATE SODIUM (COLACE) 100 MG CAPSULE    Take 200 mg by mouth at bedtime.    DORZOLAMIDE-TIMOLOL (COSOPT) 22.3-6.8 MG/ML OPHTHALMIC SOLUTION    Place 1 drop into the left eye every 12 (twelve) hours.   DULOXETINE (CYMBALTA) 30 MG CAPSULE    Take 30 mg by mouth 2 (two) times daily.   FENTANYL (DURAGESIC - DOSED MCG/HR) 100 MCG/HR    Apply one patch topically every 3 days for pain. Remove old patch before applying new patch.   FUROSEMIDE (LASIX) 40 MG TABLET    Take 40 mg by mouth 2 (two) times daily.    GLUCAGON (GLUCAGEN) 1 MG SOLR INJECTION    Inject 1 mg into the vein once as needed for low blood sugar.   GLUCOSAMINE-CHONDROITIN 500-400 MG TABLET    Take 1 tablet by mouth 3 (three) times daily.   GUAIFENESIN (ROBITUSSIN) 100 MG/5ML SYRUP    Take 300 mg by mouth 3 (three) times daily as needed for cough.   HYDROXYZINE (ATARAX/VISTARIL) 25 MG TABLET    Take 25 mg by mouth at bedtime as needed. For itching   INSULIN ASPART (NOVOLOG) 100 UNIT/ML INJECTION    Inject 5 Units into the skin 3 (three) times  daily with meals. 8 units If CBG is greater than 150 at lunch and 12 units if CBG is greater than 150 at breakfast and dinner   INSULIN GLARGINE (LANTUS) 100 UNIT/ML INJECTION    Inject 18 Units into the skin at bedtime.    ISOSORBIDE MONONITRATE (IMDUR) 60 MG 24 HR TABLET    Take 60 mg by mouth every morning.    LEVOTHYROXINE (SYNTHROID, LEVOTHROID) 75 MCG TABLET    Take 75-112.5 mcg by mouth daily. Take 1 and 1/2 tablets on Saturdays then 1 tablet all other days   LINZESS 145 MCG CAPS CAPSULE    Take 145 mcg by mouth daily. Take 30 mins prior to breakfast on an empty stomach.   LORATADINE (CLARITIN) 10 MG TABLET    Take 10 mg by mouth daily.    LOSARTAN (COZAAR) 25  MG TABLET    Take 25 mg by mouth daily.   LUBIPROSTONE (AMITIZA) 24 MCG CAPSULE    Take 24 mcg by mouth 2 (two) times daily with a meal.     MEMANTINE HCL ER (NAMENDA XR) 28 MG CP24    Take by mouth.   METHYLCELLULOSE (ARTIFICIAL TEARS) 1 % OPHTHALMIC SOLUTION    Place 1 drop into both eyes 4 (four) times daily as needed (for dryness).   MULTIPLE VITAMINS-MINERALS (CERTAVITE SENIOR/ANTIOXIDANT PO)    Take 1 tablet by mouth daily.   NITROGLYCERIN (NITROSTAT) 0.4 MG SL TABLET    Place 0.4 mg under the tongue every 5 (five) minutes as needed. For chest pain   NUTRITIONAL SUPPLEMENTS (RESOURCE 2.0 PO)    Take 120 mLs by mouth 3 (three) times daily.   OMEPRAZOLE (PRILOSEC) 20 MG CAPSULE    Take 20 mg by mouth daily.    PROBIOTIC PRODUCT (ALIGN) 4 MG CAPS    Take 1 capsule by mouth daily.   PROPRANOLOL (INDERAL) 20 MG TABLET    Take 20 mg by mouth 2 (two) times daily.   SPIRONOLACTONE (ALDACTONE) 25 MG TABLET    Take 25 mg by mouth daily.    VITAMIN D, ERGOCALCIFEROL, (DRISDOL) 50000 UNITS CAPS    Take 50,000 Units by mouth every 30 (thirty) days. On the first of the month  Modified Medications   No medications on file  Discontinued Medications   No medications on file     Review of Systems  Constitutional: Negative for fever, chills and diaphoresis.  HENT: Positive for hearing loss. Negative for congestion, ear discharge, ear pain, nosebleeds, sore throat and tinnitus.   Eyes: Negative for photophobia, pain, discharge and redness.  Respiratory: Negative for cough, shortness of breath, wheezing and stridor.   Cardiovascular: Positive for leg swelling. Negative for chest pain and palpitations.  Gastrointestinal: Negative for nausea, vomiting, abdominal pain, diarrhea, constipation and blood in stool.  Endocrine: Negative for polydipsia.       Diabetic with circulatory and neurologic complications  Genitourinary: Positive for frequency. Negative for dysuria, urgency, hematuria and flank pain.        Recurrent urinary tract infections  Musculoskeletal: Positive for back pain. Negative for myalgias and neck pain.  Skin: Positive for rash.       Excoriated buttocks with pressure ulcers L+R buttocks Bruise right temple  Allergic/Immunologic: Negative for environmental allergies.  Neurological: Negative for dizziness, tremors, seizures, weakness and headaches.  Psychiatric/Behavioral: Negative for suicidal ideas and hallucinations. The patient is nervous/anxious.     Filed Vitals:   05/28/15 1413  BP: 137/84  Pulse: 79  Temp:  97.4 F (36.3 C)  Resp: 20  Height: 5\' 3"  (1.6 m)  Weight: 194 lb (87.998 kg)  SpO2: 95%   Body mass index is 34.37 kg/(m^2).  Physical Exam  Constitutional: She appears well-developed.  HENT:  Head: Normocephalic and atraumatic.  Left Ear: External ear normal.  Eyes: Conjunctivae and EOM are normal. Pupils are equal, round, and reactive to light.  Neck: Neck supple. No JVD present. No thyromegaly present.  Cardiovascular: Normal rate and regular rhythm.   Murmur heard. Pulmonary/Chest: Effort normal. She has rales.  Bibasilar rales.   Abdominal: Soft. There is no tenderness. There is no rebound.  Musculoskeletal: She exhibits edema and tenderness.  Back pain is chronic which is well controlled. BLE chronic edema trace only. Left foot external deviated deformity.   Lymphadenopathy:    She has no cervical adenopathy.  Neurological: She is alert. No cranial nerve deficit. Coordination normal.  Skin: Skin is warm and dry. Rash noted.  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 R+L Intergluteal cleft recurrent pressure uinjuries in mirror image   Bruise just lateral to the right eye and on the right temple.   Psychiatric: Her mood appears anxious. Her affect is not blunt, not labile and not inappropriate. Her speech is rapid and/or pressured (occasionally ). Her speech is not delayed and not slurred. She is hyperactive. She is not agitated, not aggressive, not  slowed, not withdrawn, not actively hallucinating and not combative. Thought content is not paranoid and not delusional. She does not exhibit a depressed mood. She exhibits abnormal recent memory.  MMSE 21/30 08/2013     Labs reviewed: Nursing Home on 05/01/2015  Component Date Value Ref Range Status  . Hemoglobin 04/30/2015 17.5* 12.0 - 16.0 g/dL Final  . HCT 04/30/2015 51* 36 - 46 % Final  . Platelets 04/30/2015 208  150 - 399 K/L Final  . WBC 04/30/2015 6.4   Final  Lab on 04/28/2015  Component Date Value Ref Range Status  . Hemoglobin 04/27/2015 17.3* 12.0 - 16.0 g/dL Final  . HCT 04/27/2015 51* 36 - 46 % Final  . Platelets 04/27/2015 220  150 - 399 K/L Final  . WBC 04/27/2015 6.8   Final  . Glucose 04/27/2015 149   Final  . BUN 04/27/2015 28* 4 - 21 mg/dL Final  . Creatinine 04/27/2015 0.9  0.5 - 1.1 mg/dL Final  . Potassium 04/27/2015 3.7  3.4 - 5.3 mmol/L Final  . Sodium 04/27/2015 137  137 - 147 mmol/L Final  . Alkaline Phosphatase 04/27/2015 101  25 - 125 U/L Final  . ALT 04/27/2015 14  7 - 35 U/L Final  . AST 04/27/2015 17  13 - 35 U/L Final  . Bilirubin, Total 04/27/2015 0.6   Final  . Hgb A1c MFr Bld 04/27/2015 9.6* 4.0 - 6.0 % Final  . TSH 04/27/2015 2.66  0.41 - 5.90 uIU/mL Final     Assessment/Plan  1. Urinary tract infection without hematuria, site unspecified Continue to monitor for signs and symptoms of recurrent UTI  2. Type 2 diabetes mellitus with diabetic neuropathy affecting both sides of body -Increase Lantus to 22 units nightly  3. Controlled type 2 DM with peripheral circulatory disorder Increase Lantus to 22 units nightly  4. Chronic atrial fibrillation Rate controlled. Not a candidate for anticoagulation due to falls.  5. Dementia with behavioral disturbance Remains on memantine. Behavior has improved somewhat  6. Edema Chronic and unchanged  7. Falls frequently High risk for further falls  8.  Essential  hypertension Controlled  9. Hypothyroidism, unspecified hypothyroidism type Compensated  10. Venous stasis dermatitis of both lower extremities Continue to monitor for recurrent issues with skin breakdown of the lower extremities.

## 2015-06-02 ENCOUNTER — Encounter: Payer: Self-pay | Admitting: Oncology

## 2015-06-02 ENCOUNTER — Ambulatory Visit (INDEPENDENT_AMBULATORY_CARE_PROVIDER_SITE_OTHER): Payer: Medicare Other | Admitting: Oncology

## 2015-06-02 VITALS — BP 139/79 | HR 87 | Temp 97.9°F | Wt 191.3 lb

## 2015-06-02 DIAGNOSIS — R718 Other abnormality of red blood cells: Secondary | ICD-10-CM

## 2015-06-02 DIAGNOSIS — D751 Secondary polycythemia: Secondary | ICD-10-CM

## 2015-06-02 LAB — CBC WITH DIFFERENTIAL/PLATELET
BASOS ABS: 0.1 10*3/uL (ref 0.0–0.1)
Basophils Relative: 1 % (ref 0–1)
Eosinophils Absolute: 0.6 10*3/uL (ref 0.0–0.7)
Eosinophils Relative: 8 % — ABNORMAL HIGH (ref 0–5)
HEMATOCRIT: 48.9 % — AB (ref 36.0–46.0)
HEMOGLOBIN: 16.5 g/dL — AB (ref 12.0–15.0)
LYMPHS ABS: 1.6 10*3/uL (ref 0.7–4.0)
Lymphocytes Relative: 21 % (ref 12–46)
MCH: 33.1 pg (ref 26.0–34.0)
MCHC: 33.7 g/dL (ref 30.0–36.0)
MCV: 98.2 fL (ref 78.0–100.0)
MPV: 11.3 fL (ref 8.6–12.4)
Monocytes Absolute: 0.2 10*3/uL (ref 0.1–1.0)
Monocytes Relative: 3 % (ref 3–12)
NEUTROS ABS: 5.2 10*3/uL (ref 1.7–7.7)
Neutrophils Relative %: 67 % (ref 43–77)
Platelets: 216 10*3/uL (ref 150–400)
RBC: 4.98 MIL/uL (ref 3.87–5.11)
RDW: 13.8 % (ref 11.5–15.5)
WBC: 7.7 10*3/uL (ref 4.0–10.5)

## 2015-06-02 LAB — RETICULOCYTES
ABS RETIC: 54.8 10*3/uL (ref 19.0–186.0)
RBC.: 4.98 MIL/uL (ref 3.87–5.11)
RETIC CT PCT: 1.1 % (ref 0.4–2.3)

## 2015-06-02 LAB — SAVE SMEAR

## 2015-06-02 NOTE — Patient Instructions (Signed)
To lab today Schedule ultrasound of abdomen We will call patient with test results - return visit will be scheduled as needed

## 2015-06-02 NOTE — Progress Notes (Signed)
Patient ID: [name], female   DOB: 11-10-1926, 81 y.o.   MRN: [id number] New Patient Hematology   [name] [id number] Mar 20, 1926 81 y.o. 06/02/2015  CC: Dr. Reynold Bowen   Reason for referral: Unexplained elevation of hemoglobin in a very old woman   HPI:  81 year old woman currently residing at the [facility]. I was her oncologist in the past when she was diagnosed with breast cancer. 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 She had a recurrent area of DCIS in the right breast diagnosed in March 2011 and underwent a mastectomy in May 2011. She had breast radiation at time of the initial diagnosis of the invasive cancer. She failed to keep multiple scheduled visits with me and I am not seen her since February 2011. She is a retired Training and development officer. She lost her husband. She has developed progressive medical problems and progressive memory loss. She has developed chronic venous stasis changes with recurrent ulcers of her lower extremities, diabetic neuropathy with foot ulcers, and is no longer able to take care of herself. Additional medical problems include hypertension, hyperlipidemia, chronic renal insufficiency, degenerative arthritis, reflux esophagitis, hypothyroidism, paroxysmal atrial fibrillation, single-vessel coronary artery disease, chronic diastolic heart failure with grade 2 diastolic dysfunction with reserved left ventricular ejection fraction 60-65% on an echocardiogram done in October 2013. Right ventricular function normal on that study. Routine laboratory work done on 04/27/2015 showed a hemoglobin of 17, hematocrit 51, MCV 97, white count 6800, 60% neutrophils, 27% lymphocytes, 8 monocytes, 4 eosinophils platelets 222,000. Repeat on June 16 unchanged. BUN 28. Creatinine 0.9. Bilirubin 0.6. Transaminases and  alkaline phosphatase normal. Total protein and albumin not recorded. Urinalysis without blood or protein. TSH 2.66. Hemoglobin and hematocrit done 10/01/2014 14.5 and 43% respectively.  She has no obvious reasons for secondary polycythemia. She is a never smoker. She denies any history of asthma, emphysema, or tuberculosis. No exposure to cotton dust or asbestos. Most recent chest radiograph done 07/20/2013 showed cardiomegaly, some platelike atelectasis on the left, no lymphadenopathy, no signs of chronic pulmonary disease.   PMH: Past Medical History  Diagnosis Date  . Diabetes mellitus   . Hypertension   . Heart murmur   . Benign familial tremor   . Rosacea   . Diverticulitis   . Heart failure     diastolic heart failure  . TIA (transient ischemic attack) 2010  . Arthritis   . Breast cancer     breast -rt  . CAD (coronary artery disease)     single vessel CAD per cath in 2006; scattered nonobstructive disease in the left system, left to right collaterals to the RCA which was occluded by flush shots; she has been managed medically    Past Surgical History  Procedure Laterality Date  . Mastectomy partial / lumpectomy  11/02/2000    Right, with SLN  . Mastectomy partial / lumpectomy  05/15/2008    Left  . Breast lumpectomy  01/19/2010    right  . Mastectomy  03/31/2010    Right    Allergies: Allergies  Allergen Reactions  . Penicillins Other (See Comments)    Per MAR  . Bee Venom Other (See Comments)    Per MAR  . Celebrex [Celecoxib] Other (See Comments)    Per MAR  . Lidocaine Hcl Other (See Comments)  Per MAR  . Phenobarbital Other (See Comments)    Per MAR  . Procaine Hcl Other (See Comments)    Per mar    Medications:  Current outpatient prescriptions:  .  acetaminophen (TYLENOL) 325 MG tablet, Take 2 tablets (650 mg total) by mouth every 4 (four) hours as needed., Disp: 30 tablet, Rfl: 2 .  alum & mag hydroxide-simeth (MAALOX/MYLANTA) 200-200-20 MG/5ML  suspension, Take 30 mLs by mouth every 6 (six) hours as needed for indigestion or heartburn., Disp: , Rfl:  .  aspirin EC 81 MG tablet, Take 81 mg by mouth daily., Disp: , Rfl:  .  AYR SALINE NASAL NO-DRIP GEL, Place into the nose 2 (two) times daily as needed (nose bleeds)., Disp: , Rfl:  .  docusate sodium (COLACE) 100 MG capsule, Take 200 mg by mouth at bedtime. , Disp: , Rfl:  .  dorzolamide-timolol (COSOPT) 22.3-6.8 MG/ML ophthalmic solution, Place 1 drop into the left eye every 12 (twelve) hours., Disp: , Rfl:  .  DULoxetine (CYMBALTA) 30 MG capsule, Take 30 mg by mouth 2 (two) times daily., Disp: , Rfl:  .  fentaNYL (DURAGESIC - DOSED MCG/HR) 100 MCG/HR, Apply one patch topically every 3 days for pain. Remove old patch before applying new patch., Disp: 10 patch, Rfl: 0 .  furosemide (LASIX) 40 MG tablet, Take 40 mg by mouth 2 (two) times daily. , Disp: , Rfl:  .  glucagon (GLUCAGEN) 1 MG SOLR injection, Inject 1 mg into the vein once as needed for low blood sugar., Disp: , Rfl:  .  glucosamine-chondroitin 500-400 MG tablet, Take 1 tablet by mouth 3 (three) times daily., Disp: , Rfl:  .  guaifenesin (ROBITUSSIN) 100 MG/5ML syrup, Take 300 mg by mouth 3 (three) times daily as needed for cough., Disp: , Rfl:  .  hydrOXYzine (ATARAX/VISTARIL) 25 MG tablet, Take 25 mg by mouth at bedtime as needed. For itching, Disp: , Rfl:  .  insulin aspart (NOVOLOG) 100 UNIT/ML injection, Inject 5 Units into the skin 3 (three) times daily with meals. 8 units If CBG is greater than 150 at lunch and 12 units if CBG is greater than 150 at breakfast and dinner, Disp: , Rfl:  .  insulin glargine (LANTUS) 100 UNIT/ML injection, Inject 18 Units into the skin at bedtime. , Disp: , Rfl:  .  isosorbide mononitrate (IMDUR) 60 MG 24 hr tablet, Take 60 mg by mouth every morning. , Disp: , Rfl:  .  levothyroxine (SYNTHROID, LEVOTHROID) 75 MCG tablet, Take 75-112.5 mcg by mouth daily. Take 1 and 1/2 tablets on Saturdays then 1  tablet all other days, Disp: , Rfl:  .  LINZESS 145 MCG CAPS capsule, Take 145 mcg by mouth daily. Take 30 mins prior to breakfast on an empty stomach., Disp: , Rfl:  .  loratadine (CLARITIN) 10 MG tablet, Take 10 mg by mouth daily. , Disp: , Rfl:  .  losartan (COZAAR) 25 MG tablet, Take 25 mg by mouth daily., Disp: , Rfl:  .  lubiprostone (AMITIZA) 24 MCG capsule, Take 24 mcg by mouth 2 (two) times daily with a meal.  , Disp: , Rfl:  .  Memantine HCl ER (NAMENDA XR) 28 MG CP24, Take by mouth., Disp: , Rfl:  .  methylcellulose (ARTIFICIAL TEARS) 1 % ophthalmic solution, Place 1 drop into both eyes 4 (four) times daily as needed (for dryness)., Disp: , Rfl:  .  Multiple Vitamins-Minerals (CERTAVITE SENIOR/ANTIOXIDANT PO), Take 1 tablet by mouth daily.,  Disp: , Rfl:  .  nitroGLYCERIN (NITROSTAT) 0.4 MG SL tablet, Place 0.4 mg under the tongue every 5 (five) minutes as needed. For chest pain, Disp: , Rfl:  .  Nutritional Supplements (RESOURCE 2.0 PO), Take 120 mLs by mouth 3 (three) times daily., Disp: , Rfl:  .  omeprazole (PRILOSEC) 20 MG capsule, Take 20 mg by mouth daily. , Disp: , Rfl:  .  Probiotic Product (ALIGN) 4 MG CAPS, Take 1 capsule by mouth daily., Disp: , Rfl:  .  propranolol (INDERAL) 20 MG tablet, Take 20 mg by mouth 2 (two) times daily., Disp: , Rfl:  .  spironolactone (ALDACTONE) 25 MG tablet, Take 25 mg by mouth daily. , Disp: , Rfl:  .  Vitamin D, Ergocalciferol, (DRISDOL) 50000 UNITS CAPS, Take 50,000 Units by mouth every 30 (thirty) days. 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 She is now a widow. She has 2 sons and a daughter all in the sciences.  She has never smoked.    Family History: She has 2 brothers who are still alive. One is older than her. There is no family history of any blood disorder that she is aware of.  Review of Systems: See HPI Remaining ROS negative.  Physical Exam: Blood pressure  139/79, pulse 87, temperature 97.9 F (36.6 C), temperature source Oral, weight 191 lb 4.8 oz (86.773 kg), SpO2 95 %. Wt Readings from Last 3 Encounters:  06/02/15 191 lb 4.8 oz (86.773 kg)  05/28/15 194 lb (87.998 kg)  02/20/15 190 lb 12.8 oz (86.546 kg)     General appearance: well nourished caucasian woman HENNT: Bilateral Esotrpion of the eyes. Dense corneal scarring left eye. No light reflex. Pharynx no erythema, exudate, mass, or ulcer. No thyromegaly or thyroid nodules Lymph nodes: No cervical, supraclavicular, or axillary lymphadenopathy Breasts: Right mastectomy. No left breast masses. Lungs: Clear to auscultation, resonant to percussion throughout Heart: Regular rhythm, no murmur, no gallop, no rub, no click, no edema Abdomen: Soft, nontender, normal bowel sounds, no mass, no organomegaly. Incomplete exam with patient in wheelchair. Extremities: Chronic edema, extensive venous stasis changes, purple discoloration of her legs up to the midcalf bilaterally. no calf tenderness Musculoskeletal: no joint deformities GU:  Vascular: Carotid pulses 2+, no bruits, distal pulses: Dorsalis pedis 1+ symmetric. No cyanotic changes of the nailbeds or lips. Neurologic: Alert, oriented, poor recent memory.Unable to do simple calculations, however, she is an Training and development officer and when I asked her what color you get when you mix red and yellow together she appropriately said orange. left eye blind. optic discs poorly visualized on the right, no gross retinal vein distention  vessels  no hemorrhage or exudate, cranial nerves grossly normal, except for vision motor strength 5 over 5, reflexes 1+ symmetric, upper body coordination normal, gait not tested Skin: No rash or ecchymosis    Lab Results: Lab Results  Component Value Date   WBC 6.4 04/30/2015   HGB 17.5* 04/30/2015   HCT 51* 04/30/2015   MCV 97.1 07/20/2013   PLT 208 04/30/2015     Chemistry      Component Value Date/Time   NA 137 04/27/2015    NA 136 07/20/2013 1921   K 3.7 04/27/2015   CL 97 07/20/2013 1921   CO2 30 07/20/2013 1921   BUN 28* 04/27/2015   BUN 46* 07/20/2013 1921   CREATININE 0.9 04/27/2015   CREATININE 1.00 07/20/2013 1921  GLU 149 04/27/2015      Component Value Date/Time   CALCIUM 9.3 07/20/2013 1921   ALKPHOS 101 04/27/2015   AST 17 04/27/2015   ALT 14 04/27/2015   BILITOT 0.3 07/20/2013 1921    ESR 30 mm Hb 16.5 Hct: 48.9%; WBC 7,700: 8% eosinophils  Pathology: See discussion above   Review of peripheral blood film: Normochromic, normocytic red cells, no teardrop cells to suggest myelodysplasia, no schistocytes, no RBC inclusions, mature neutrophils, lymphocytes, and monocytes, occasional benign reactive lymphs, increased eosinophils. Normal platelets.   Radiological Studies: No results found.    Impression and Plan: Unexplained elevation of hemoglobin and hematocrit compared with values obtained one year ago  My overall impression is that we are seeing an artifact in the lab reporting. She has no obvious reason to have a secondary polycythemia.  oxygen saturation is 95% on room air. Never smoker; no known chronic lung disease. Although a myeloproliferative disorder is in the differential, two thirds of the patients who have a myeloproliferative disorder, since this is a stem cell disorder, will also have concomitant elevation of the white count and platelets which she does not. (Panmyelosis). Exogenous erythropoietin production must be considered with malignancy associated production seen most commonly with liver and kidney tumors.*  Recommendation: I'm going to check an erythropoietin level. Check for a mutation in the JAK-2 gene which is very specific for polycythemia vera and occurs in 100% of patients with P vera. I'm ordering an ultrasound of her abdomen to assess her liver and kidneys for any focal lesions. It will also assess for any splenomegaly that might be associated with a  underlying myeloproliferative disorder.   Phlebotomy would only be indicated if this turns out to be a myeloproliferative disorder.  *Addendum: erythropoietin level normal @ 13.5: excludes paraneoplastic production     Annia Belt, MD 06/02/2015, 7:39 PM

## 2015-06-03 ENCOUNTER — Telehealth: Payer: Self-pay | Admitting: *Deleted

## 2015-06-03 LAB — SEDIMENTATION RATE: SED RATE: 30 mm/h (ref 0–30)

## 2015-06-03 NOTE — Telephone Encounter (Signed)
[place], talked to [place] - given appt information for pt about [place] [facility] on 7/29 @ 0800AM-arrive @ 0745AM and NPO x 6 hrs prior here at [facility]. She repeated instructions correctly.

## 2015-06-04 LAB — ERYTHROPOIETIN: Erythropoietin: 13.5 m[IU]/mL (ref 2.6–18.5)

## 2015-06-05 DIAGNOSIS — D751 Secondary polycythemia: Secondary | ICD-10-CM

## 2015-06-05 HISTORY — DX: Secondary polycythemia: D75.1

## 2015-06-07 LAB — RFLX JAK2 EXONS 12 AND 13 MUTATIONS
JAK2 EXON 12 MUTATION: NOT DETECTED
JAK2 Exon 13 Mutation: NOT DETECTED

## 2015-06-07 LAB — JAK2 V617F, RFLX EXONS 12 AND 13: JAK2 V617F: NOT DETECTED

## 2015-06-12 ENCOUNTER — Ambulatory Visit (HOSPITAL_COMMUNITY)
Admission: RE | Admit: 2015-06-12 | Discharge: 2015-06-12 | Disposition: A | Discharge status: Home / Self Care | Payer: Medicare Other | Source: Ambulatory Visit | Attending: Oncology | Admitting: Oncology

## 2015-06-12 DIAGNOSIS — R932 Abnormal findings on diagnostic imaging of liver and biliary tract: Secondary | ICD-10-CM | POA: Diagnosis not present

## 2015-06-12 DIAGNOSIS — E119 Type 2 diabetes mellitus without complications: Secondary | ICD-10-CM | POA: Diagnosis not present

## 2015-06-12 DIAGNOSIS — D751 Secondary polycythemia: Secondary | ICD-10-CM | POA: Insufficient documentation

## 2015-06-12 DIAGNOSIS — Z853 Personal history of malignant neoplasm of breast: Secondary | ICD-10-CM | POA: Diagnosis not present

## 2015-06-12 DIAGNOSIS — Z9049 Acquired absence of other specified parts of digestive tract: Secondary | ICD-10-CM | POA: Diagnosis not present

## 2015-07-10 ENCOUNTER — Encounter: Payer: Self-pay | Admitting: Nurse Practitioner

## 2015-07-10 ENCOUNTER — Non-Acute Institutional Stay (SKILLED_NURSING_FACILITY): Payer: Medicare Other | Admitting: Nurse Practitioner

## 2015-07-10 DIAGNOSIS — F0391 Unspecified dementia with behavioral disturbance: Secondary | ICD-10-CM | POA: Diagnosis not present

## 2015-07-10 DIAGNOSIS — I482 Chronic atrial fibrillation, unspecified: Secondary | ICD-10-CM

## 2015-07-10 DIAGNOSIS — M5489 Other dorsalgia: Secondary | ICD-10-CM | POA: Diagnosis not present

## 2015-07-10 DIAGNOSIS — E039 Hypothyroidism, unspecified: Secondary | ICD-10-CM

## 2015-07-10 DIAGNOSIS — R609 Edema, unspecified: Secondary | ICD-10-CM | POA: Diagnosis not present

## 2015-07-10 DIAGNOSIS — F03918 Unspecified dementia, unspecified severity, with other behavioral disturbance: Secondary | ICD-10-CM

## 2015-07-10 DIAGNOSIS — E1142 Type 2 diabetes mellitus with diabetic polyneuropathy: Secondary | ICD-10-CM | POA: Diagnosis not present

## 2015-07-10 DIAGNOSIS — I10 Essential (primary) hypertension: Secondary | ICD-10-CM

## 2015-07-10 NOTE — Assessment & Plan Note (Signed)
Continue Namenda 28mg  daily to preserve memory, SNF for care needs, lack of safety awareness and increased frailty are contributory to frequent falling. Intensive supervision needed. Risk falling.

## 2015-07-10 NOTE — Assessment & Plan Note (Signed)
Controlled, continue Losartan 25mg  daily, Atenolol 20mg  bid, monitor blood pressure daily. Dr. Mare Ferrari 02/03/14 continue same cardiac meds

## 2015-07-10 NOTE — Assessment & Plan Note (Signed)
Chronic issue, takes narcotics for pain control.

## 2015-07-10 NOTE — Assessment & Plan Note (Signed)
Continue Lantus 18units daily and  Novolog 5 u with meals for CBG>150 since 02/16/15. Last Hgb A1c 9.6 04/27/15, update Hgb A1c

## 2015-07-10 NOTE — Assessment & Plan Note (Signed)
02/03/14 doing well, EKG A-fib with controlled VR. 1year office visit EKG

## 2015-07-10 NOTE — Progress Notes (Signed)
Patient ID: [name], female   DOB: [birth year], 81 y.o.   MRN: [id number]  Location:  SNF [facility] Provider:  Marlana Latus NP  Code Status:  DNR Goals of care: Advanced Directive information    Chief Complaint  Patient presents with  . Medical Management of Chronic Issues     HPI: Patient is a 81 y.o. female seen in the SNF at [facility] today for evaluation of chronic medical conditions. Hx of dementia, takes Namenda for memory preserving purpose, SNF for care needs, frequent falling in the past due to lack of safety awareness and increased frailty, she is at high risk falling, SNF with intensive supervision needed, T2DM with elevated Hgb a1d 9s 3 months ago, she is insulin dependent, hypothyroidism is well controlled with last TSH wnl. Blood pressure is controlled while taking Losartan and Atenolol. Her tremor is not disabling while on Atenolol. Chronic Afib with heart rate controlled and f/u Cardiology regularly. CHF/Edema has been stable on Furosemide and Spironolactone Review of Systems:  Review of Systems  Constitutional: Negative for fever, chills and diaphoresis.  HENT: Positive for hearing loss. Negative for congestion, ear discharge, ear pain, nosebleeds, sore throat and tinnitus.   Eyes: Negative for photophobia, pain, discharge and redness.  Respiratory: Negative for cough, shortness of breath, wheezing and stridor.   Cardiovascular: Positive for leg swelling. Negative for chest pain and palpitations.  Gastrointestinal: Negative for nausea, vomiting, abdominal pain, diarrhea, constipation and blood in stool.  Genitourinary: Positive for frequency. Negative for dysuria, urgency, hematuria and flank pain.       Recurrent urinary tract infections  Musculoskeletal: Positive for back pain. Negative for myalgias and neck pain.  Skin: Positive for rash.       Long standing excoriated buttocks with pressure ulcers L+R buttocks  Neurological: Negative for dizziness, tremors, seizures,  weakness and headaches.  Endo/Heme/Allergies: Negative for environmental allergies and polydipsia.  Psychiatric/Behavioral: Positive for memory loss. Negative for suicidal ideas and hallucinations. The patient is nervous/anxious.     Past Medical History  Diagnosis Date  . Diabetes mellitus   . Hypertension   . Heart murmur   . Benign familial tremor   . Rosacea   . Diverticulitis   . Heart failure     diastolic heart failure  . TIA (transient ischemic attack) 2010  . Arthritis   . Breast cancer     breast -rt  . CAD (coronary artery disease)     single vessel CAD per cath in 2006; scattered nonobstructive disease in the left system, left to right collaterals to the RCA which was occluded by flush shots; she has been managed medically    Patient Active Problem List   Diagnosis Date Noted  . Polycythemia 06/05/2015  . Erythrocytosis 05/01/2015  . Controlled type 2 DM with peripheral circulatory disorder 11/04/2014  . Herpes zoster 09/30/2014  . Right-sided chest wall pain 09/30/2014  . Plantar fasciitis of left foot 07/18/2014  . Fecal incontinence 06/10/2014  . Left leg pain 04/18/2014  . Other malaise and fatigue 04/18/2014  . UTI (urinary tract infection) 02/09/2014  . Venous stasis dermatitis of both lower extremities 12/10/2013  . Candidiasis of skin 09/10/2013  . Dementia with behavioral disturbance 08/23/2013  . Right hip pain 08/13/2013  . PAF (paroxysmal atrial fibrillation) 07/26/2013  . A-fib 07/23/2013  . Edema 06/27/2013  . Ulcer of foot due to diabetes mellitus 06/27/2013  . Pressure ulcer stage II 06/18/2013  . Falls frequently 04/23/2013  . DNR (  do not resuscitate) 04/23/2013  . Type 2 diabetes mellitus with diabetic neuropathy affecting both sides of body 02/26/2013  . Hypothyroidism 02/26/2013  . Depression 02/26/2013  . Essential tremor 02/26/2013  . Back pain 02/26/2013  . HTN (hypertension) 02/26/2013  . Constipation 02/26/2013  . Ischemic  heart disease 10/02/2012  . Leg swelling 10/02/2012  . Dyspepsia 10/02/2012  . Diabetic foot ulcer 07/15/2011  . 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 Penicillins Other (See Comments)    Per MAR  . Bee Venom Other (See Comments)    Per MAR  . Celebrex [Celecoxib] Other (See Comments)    Per MAR  . Lidocaine Hcl Other (See Comments)    Per MAR  . Phenobarbital Other (See Comments)    Per MAR  . Procaine Hcl Other (See Comments)    Per mar    Medications: Patient's Medications  New Prescriptions   No medications on file  Previous Medications   ACETAMINOPHEN (TYLENOL) 325 MG TABLET    Take 2 tablets (650 mg total) by mouth every 4 (four) hours as needed.   ALUM & MAG HYDROXIDE-SIMETH (MAALOX/MYLANTA) 200-200-20 MG/5ML SUSPENSION    Take 30 mLs by mouth every 6 (six) hours as needed for indigestion or heartburn.   ASPIRIN EC 81 MG TABLET    Take 81 mg by mouth daily.   AYR SALINE NASAL NO-DRIP GEL    Place into the nose 2 (two) times daily as needed (nose bleeds).   DOCUSATE SODIUM (COLACE) 100 MG CAPSULE    Take 200 mg by mouth at bedtime.    DORZOLAMIDE-TIMOLOL (COSOPT) 22.3-6.8 MG/ML OPHTHALMIC SOLUTION    Place 1 drop into the left eye every 12 (twelve) hours.   DULOXETINE (CYMBALTA) 30 MG CAPSULE    Take 30 mg by mouth 2 (two) times daily.   FENTANYL (DURAGESIC - DOSED MCG/HR) 100 MCG/HR    Apply one patch topically every 3 days for pain. Remove old patch before applying new patch.   FUROSEMIDE (LASIX) 40 MG TABLET    Take 40 mg by mouth 2 (two) times daily.    GLUCAGON (GLUCAGEN) 1 MG SOLR INJECTION    Inject 1 mg into the vein once as needed for low blood sugar.   GLUCOSAMINE-CHONDROITIN 500-400 MG TABLET    Take 1 tablet by mouth 3 (three) times daily.   GUAIFENESIN (ROBITUSSIN) 100 MG/5ML SYRUP    Take 300 mg by mouth 3 (three) times daily as needed for cough.   HYDROXYZINE (ATARAX/VISTARIL) 25 MG TABLET    Take 25 mg by  mouth at bedtime as needed. For itching   INSULIN ASPART (NOVOLOG) 100 UNIT/ML INJECTION    Inject 5 Units into the skin 3 (three) times daily with meals. 8 units If CBG is greater than 150 at lunch and 12 units if CBG is greater than 150 at breakfast and dinner   INSULIN GLARGINE (LANTUS) 100 UNIT/ML INJECTION    Inject 18 Units into the skin at bedtime.    ISOSORBIDE MONONITRATE (IMDUR) 60 MG 24 HR TABLET    Take 60 mg by mouth every morning.    LEVOTHYROXINE (SYNTHROID, LEVOTHROID) 75 MCG TABLET    Take 75-112.5 mcg by mouth daily. Take 1 and 1/2 tablets on Saturdays then 1 tablet all other days   LINZESS 145 MCG CAPS CAPSULE    Take 145 mcg by mouth daily. Take 30 mins prior to breakfast on an empty  stomach.   LORATADINE (CLARITIN) 10 MG TABLET    Take 10 mg by mouth daily.    LOSARTAN (COZAAR) 25 MG TABLET    Take 25 mg by mouth daily.   LUBIPROSTONE (AMITIZA) 24 MCG CAPSULE    Take 24 mcg by mouth 2 (two) times daily with a meal.     MEMANTINE HCL ER (NAMENDA XR) 28 MG CP24    Take by mouth.   METHYLCELLULOSE (ARTIFICIAL TEARS) 1 % OPHTHALMIC SOLUTION    Place 1 drop into both eyes 4 (four) times daily as needed (for dryness).   MULTIPLE VITAMINS-MINERALS (CERTAVITE SENIOR/ANTIOXIDANT PO)    Take 1 tablet by mouth daily.   NITROGLYCERIN (NITROSTAT) 0.4 MG SL TABLET    Place 0.4 mg under the tongue every 5 (five) minutes as needed. For chest pain   NUTRITIONAL SUPPLEMENTS (RESOURCE 2.0 PO)    Take 120 mLs by mouth 3 (three) times daily.   OMEPRAZOLE (PRILOSEC) 20 MG CAPSULE    Take 20 mg by mouth daily.    PROBIOTIC PRODUCT (ALIGN) 4 MG CAPS    Take 1 capsule by mouth daily.   PROPRANOLOL (INDERAL) 20 MG TABLET    Take 20 mg by mouth 2 (two) times daily.   SPIRONOLACTONE (ALDACTONE) 25 MG TABLET    Take 25 mg by mouth daily.    VITAMIN D, ERGOCALCIFEROL, (DRISDOL) 50000 UNITS CAPS    Take 50,000 Units by mouth every 30 (thirty) days. On the first of the month  Modified Medications   No  medications on file  Discontinued Medications   No medications on file    Physical Exam: Filed Vitals:   07/10/15 0941  BP: 126/70  Pulse: 72  Temp: 98.2 F (36.8 C)  TempSrc: Tympanic  Resp: 16   There is no weight on file to calculate BMI.  Physical Exam  Constitutional: She appears well-developed.  HENT:  Head: Normocephalic and atraumatic.  Left Ear: External ear normal.  Eyes: Conjunctivae and EOM are normal. Pupils are equal, round, and reactive to light.  Neck: Neck supple. No JVD present. No thyromegaly present.  Cardiovascular: Normal rate and regular rhythm.   Murmur heard. Pulmonary/Chest: Effort normal. She has rales.  Bibasilar rales.   Abdominal: Soft. There is no tenderness. There is no rebound.  Musculoskeletal: She exhibits edema and tenderness.  Back pain is chronic which is well controlled. BLE chronic edema trace only. Left foot external deviated deformity.   Lymphadenopathy:    She has no cervical adenopathy.  Neurological: She is alert. No cranial nerve deficit. Coordination normal.  Skin: Skin is warm and dry. Rash noted.  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 R+L Intergluteal cleft recurrent pressure uinjuries in mirror image     Psychiatric: Her mood appears anxious. Her affect is not blunt, not labile and not inappropriate. Her speech is rapid and/or pressured (occasionally ). Her speech is not delayed and not slurred. She is hyperactive. She is not agitated, not aggressive, not slowed, not withdrawn, not actively hallucinating and not combative. Thought content is not paranoid and not delusional. She does not exhibit a depressed mood. She exhibits abnormal recent memory.  MMSE 21/30 08/2013    Labs reviewed: Basic Metabolic Panel:  Recent Labs  10/01/14 04/27/15  NA 136* 137  K 4.9 3.7  BUN 35* 28*  CREATININE 0.9 0.9    Liver Function Tests:  Recent Labs  10/01/14 04/27/15  AST 20 17  ALT 13 14  ALKPHOS 80 101  CBC:  Recent  Labs  04/27/15 04/30/15 06/02/15 1455  WBC 6.8 6.4 7.7  NEUTROABS  --   --  5.2  HGB 17.3* 17.5* 16.5*  HCT 51* 51* 48.9*  MCV  --   --  98.2  PLT 220 208 216    Lab Results  Component Value Date   TSH 2.66 04/27/2015   Lab Results  Component Value Date   HGBA1C 9.6* 04/27/2015   Lab Results  Component Value Date   CHOL  10/22/2010    100        ATP III CLASSIFICATION:  <200     mg/dL   Desirable  200-239  mg/dL   Borderline High  >=240    mg/dL   High          HDL 55 10/22/2010   LDLCALC  10/22/2010    29        Total Cholesterol/HDL:CHD Risk Coronary Heart Disease Risk Table                     Men   Women  1/2 Average Risk   3.4   3.3  Average Risk       5.0   4.4  2 X Average Risk   9.6   7.1  3 X Average Risk  23.4   11.0        Use the calculated Patient Ratio above and the CHD Risk Table to determine the patient's CHD Risk.        ATP III CLASSIFICATION (LDL):  <100     mg/dL   Optimal  100-129  mg/dL   Near or Above                    Optimal  130-159  mg/dL   Borderline  160-189  mg/dL   High  >190     mg/dL   Very High   TRIG 80 10/22/2010   CHOLHDL 1.8 10/22/2010    Significant Diagnostic Results since last visit: none  Patient Care Team: Reynold Bowen, MD as PCP - General (Endocrinology) Darlin Coco, MD (Cardiology) Arloa Koh, MD (Radiation Oncology) Neldon Mc, MD (General Surgery)  Assessment/Plan Problem List Items Addressed This Visit    Type 2 diabetes mellitus with diabetic neuropathy affecting both sides of body - Primary (Chronic)    Continue Lantus 18units daily and  Novolog 5 u with meals for CBG>150 since 02/16/15. Last Hgb A1c 9.6 04/27/15, update Hgb A1c      Hypothyroidism (Chronic)    Corrected with Levothyroxine 112.62mcg. 04/27/15 TSH 2.663      HTN (hypertension) (Chronic)    Controlled, continue Losartan 25mg  daily, Atenolol 20mg  bid, monitor blood pressure daily. Dr. Mare Ferrari 02/03/14 continue same  cardiac meds      Edema (Chronic)    Chronic venous insufficiency. Takes Furosemide 40mg  bid and Spironolctone25mg  daily. Trace edema BLE.      A-fib (Chronic)    02/03/14 doing well, EKG A-fib with controlled VR. 1year office visit EKG      Dementia with behavioral disturbance (Chronic)    Continue Namenda 28mg  daily to preserve memory, SNF for care needs, lack of safety awareness and increased frailty are contributory to frequent falling. Intensive supervision needed. Risk falling.       Back pain    Chronic issue, takes narcotics for pain control.           Family/ staff Communication: fall risk  Labs/tests ordered:  Hgb A1c   ManXie Abbi Mancini NP [facility] [address], [place] [address] On Call:  [phone] & follow prompts after 5pm & weekends Office Phone:  [phone] Office Fax:  [phone]

## 2015-07-10 NOTE — Assessment & Plan Note (Signed)
Chronic venous insufficiency. Takes Furosemide 40mg  bid and Spironolctone25mg  daily. Trace edema BLE.

## 2015-07-10 NOTE — Assessment & Plan Note (Signed)
Corrected with Levothyroxine 112.23mcg. 04/27/15 TSH 2.663

## 2015-07-24 ENCOUNTER — Non-Acute Institutional Stay (SKILLED_NURSING_FACILITY): Payer: Medicare Other | Admitting: Nurse Practitioner

## 2015-07-24 ENCOUNTER — Other Ambulatory Visit: Payer: Self-pay | Admitting: Nurse Practitioner

## 2015-07-24 ENCOUNTER — Encounter: Payer: Self-pay | Admitting: Nurse Practitioner

## 2015-07-24 DIAGNOSIS — R609 Edema, unspecified: Secondary | ICD-10-CM

## 2015-07-24 DIAGNOSIS — F329 Major depressive disorder, single episode, unspecified: Secondary | ICD-10-CM | POA: Diagnosis not present

## 2015-07-24 DIAGNOSIS — F0391 Unspecified dementia with behavioral disturbance: Secondary | ICD-10-CM

## 2015-07-24 DIAGNOSIS — D751 Secondary polycythemia: Secondary | ICD-10-CM

## 2015-07-24 DIAGNOSIS — E1142 Type 2 diabetes mellitus with diabetic polyneuropathy: Secondary | ICD-10-CM | POA: Diagnosis not present

## 2015-07-24 DIAGNOSIS — E039 Hypothyroidism, unspecified: Secondary | ICD-10-CM | POA: Diagnosis not present

## 2015-07-24 DIAGNOSIS — G250 Essential tremor: Secondary | ICD-10-CM

## 2015-07-24 DIAGNOSIS — I259 Chronic ischemic heart disease, unspecified: Secondary | ICD-10-CM

## 2015-07-24 DIAGNOSIS — M5440 Lumbago with sciatica, unspecified side: Secondary | ICD-10-CM | POA: Diagnosis not present

## 2015-07-24 DIAGNOSIS — I482 Chronic atrial fibrillation, unspecified: Secondary | ICD-10-CM

## 2015-07-24 DIAGNOSIS — K5900 Constipation, unspecified: Secondary | ICD-10-CM | POA: Diagnosis not present

## 2015-07-24 DIAGNOSIS — I10 Essential (primary) hypertension: Secondary | ICD-10-CM | POA: Diagnosis not present

## 2015-07-24 DIAGNOSIS — F32A Depression, unspecified: Secondary | ICD-10-CM

## 2015-07-24 DIAGNOSIS — F03918 Unspecified dementia, unspecified severity, with other behavioral disturbance: Secondary | ICD-10-CM

## 2015-07-24 LAB — HEMOGLOBIN A1C: HEMOGLOBIN A1C: 8.7 % — AB (ref 4.0–6.0)

## 2015-07-24 NOTE — Progress Notes (Signed)
Patient ID: [name], female   DOB: February 19, 1926, 81 y.o.   MRN: [id number]  Location:  SNF [facility] Provider:  Marlana Latus NP  Code Status:  DNR Goals of care: Advanced Directive information    Chief Complaint  Patient presents with  . Medical Management of Chronic Issues     HPI: Patient is a 80 y.o. female seen in the SNF at [name] today for evaluation of chronic medical conditions. Hx of depression/anxiety, stabilized, tolerated Cymbalta GDR about 4 weeks ago. Her blood sugar is better controlled, Hgb A1c down from 9.6 04/2015 to 8.7 07/2015. She sleeps more and walks less gradually. Chronic excoriated buttocks from moist and pressures has no change but not infected.   Review of Systems:  Review of Systems  Constitutional: Negative for fever, chills and diaphoresis.  HENT: Positive for hearing loss. Negative for congestion, ear discharge, ear pain, nosebleeds, sore throat and tinnitus.   Eyes: Negative for photophobia, pain, discharge and redness.  Respiratory: Negative for cough, shortness of breath, wheezing and stridor.   Cardiovascular: Positive for leg swelling. Negative for chest pain and palpitations.  Gastrointestinal: Negative for nausea, vomiting, abdominal pain, diarrhea, constipation and blood in stool.  Genitourinary: Positive for frequency. Negative for dysuria, urgency, hematuria and flank pain.       Recurrent urinary tract infections  Musculoskeletal: Positive for back pain. Negative for myalgias and neck pain.  Skin: Positive for rash.       Long standing excoriated buttocks with pressure ulcers L+R buttocks  Neurological: Negative for dizziness, tremors, seizures, weakness and headaches.  Endo/Heme/Allergies: Negative for environmental allergies and polydipsia.  Psychiatric/Behavioral: Positive for memory loss. Negative for suicidal ideas and hallucinations. The patient is nervous/anxious.     Past Medical History  Diagnosis Date  . Diabetes mellitus   .  Hypertension   . Heart murmur   . Benign familial tremor   . Rosacea   . Diverticulitis   . Heart failure     diastolic heart failure  . TIA (transient ischemic attack) 2010  . Arthritis   . Breast cancer     breast -rt  . CAD (coronary artery disease)     single vessel CAD per cath in 2006; scattered nonobstructive disease in the left system, left to right collaterals to the RCA which was occluded by flush shots; she has been managed medically    Patient Active Problem List   Diagnosis Date Noted  . Polycythemia 06/05/2015  . Erythrocytosis 05/01/2015  . Controlled type 2 DM with peripheral circulatory disorder 11/04/2014  . Herpes zoster 09/30/2014  . Right-sided chest wall pain 09/30/2014  . Plantar fasciitis of left foot 07/18/2014  . Fecal incontinence 06/10/2014  . Left leg pain 04/18/2014  . Other malaise and fatigue 04/18/2014  . UTI (urinary tract infection) 02/09/2014  . Venous stasis dermatitis of both lower extremities 12/10/2013  . Candidiasis of skin 09/10/2013  . Dementia with behavioral disturbance 08/23/2013  . Right hip pain 08/13/2013  . PAF (paroxysmal atrial fibrillation) 07/26/2013  . A-fib 07/23/2013  . Edema 06/27/2013  . Ulcer of foot due to diabetes mellitus 06/27/2013  . Pressure ulcer stage II 06/18/2013  . Falls frequently 04/23/2013  . DNR (do not resuscitate) 04/23/2013  . Type 2 diabetes mellitus with diabetic neuropathy affecting both sides of body 02/26/2013  . Hypothyroidism 02/26/2013  . Depression 02/26/2013  . Essential tremor 02/26/2013  . Back pain 02/26/2013  . HTN (hypertension) 02/26/2013  . Constipation 02/26/2013  .  Ischemic heart disease 10/02/2012  . Leg swelling 10/02/2012  . Dyspepsia 10/02/2012  . Diabetic foot ulcer 07/15/2011  . 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 Penicillins Other (See Comments)    Per MAR  . Bee Venom Other (See Comments)    Per MAR  .  Celebrex [Celecoxib] Other (See Comments)    Per MAR  . Lidocaine Hcl Other (See Comments)    Per MAR  . Phenobarbital Other (See Comments)    Per MAR  . Procaine Hcl Other (See Comments)    Per mar    Medications: Patient's Medications  New Prescriptions   No medications on file  Previous Medications   ACETAMINOPHEN (TYLENOL) 325 MG TABLET    Take 2 tablets (650 mg total) by mouth every 4 (four) hours as needed.   ALUM & MAG HYDROXIDE-SIMETH (MAALOX/MYLANTA) 200-200-20 MG/5ML SUSPENSION    Take 30 mLs by mouth every 6 (six) hours as needed for indigestion or heartburn.   ASPIRIN EC 81 MG TABLET    Take 81 mg by mouth daily.   AYR SALINE NASAL NO-DRIP GEL    Place into the nose 2 (two) times daily as needed (nose bleeds).   DOCUSATE SODIUM (COLACE) 100 MG CAPSULE    Take 200 mg by mouth at bedtime.    DORZOLAMIDE-TIMOLOL (COSOPT) 22.3-6.8 MG/ML OPHTHALMIC SOLUTION    Place 1 drop into the left eye every 12 (twelve) hours.   DULOXETINE (CYMBALTA) 30 MG CAPSULE    Take 30 mg by mouth 2 (two) times daily.   FENTANYL (DURAGESIC - DOSED MCG/HR) 100 MCG/HR    Apply one patch topically every 3 days for pain. Remove old patch before applying new patch.   FUROSEMIDE (LASIX) 40 MG TABLET    Take 40 mg by mouth 2 (two) times daily.    GLUCAGON (GLUCAGEN) 1 MG SOLR INJECTION    Inject 1 mg into the vein once as needed for low blood sugar.   GLUCOSAMINE-CHONDROITIN 500-400 MG TABLET    Take 1 tablet by mouth 3 (three) times daily.   GUAIFENESIN (ROBITUSSIN) 100 MG/5ML SYRUP    Take 300 mg by mouth 3 (three) times daily as needed for cough.   HYDROXYZINE (ATARAX/VISTARIL) 25 MG TABLET    Take 25 mg by mouth at bedtime as needed. For itching   INSULIN ASPART (NOVOLOG) 100 UNIT/ML INJECTION    Inject 5 Units into the skin 3 (three) times daily with meals. 8 units If CBG is greater than 150 at lunch and 12 units if CBG is greater than 150 at breakfast and dinner   INSULIN GLARGINE (LANTUS) 100 UNIT/ML  INJECTION    Inject 18 Units into the skin at bedtime.    ISOSORBIDE MONONITRATE (IMDUR) 60 MG 24 HR TABLET    Take 60 mg by mouth every morning.    LEVOTHYROXINE (SYNTHROID, LEVOTHROID) 75 MCG TABLET    Take 75-112.5 mcg by mouth daily. Take 1 and 1/2 tablets on Saturdays then 1 tablet all other days   LINZESS 145 MCG CAPS CAPSULE    Take 145 mcg by mouth daily. Take 30 mins prior to breakfast on an empty stomach.   LORATADINE (CLARITIN) 10 MG TABLET    Take 10 mg by mouth daily.    LOSARTAN (COZAAR) 25 MG TABLET    Take 25 mg by mouth daily.   LUBIPROSTONE (AMITIZA) 24 MCG CAPSULE    Take 24 mcg by  mouth 2 (two) times daily with a meal.     MEMANTINE HCL ER (NAMENDA XR) 28 MG CP24    Take by mouth.   METHYLCELLULOSE (ARTIFICIAL TEARS) 1 % OPHTHALMIC SOLUTION    Place 1 drop into both eyes 4 (four) times daily as needed (for dryness).   MULTIPLE VITAMINS-MINERALS (CERTAVITE SENIOR/ANTIOXIDANT PO)    Take 1 tablet by mouth daily.   NITROGLYCERIN (NITROSTAT) 0.4 MG SL TABLET    Place 0.4 mg under the tongue every 5 (five) minutes as needed. For chest pain   NUTRITIONAL SUPPLEMENTS (RESOURCE 2.0 PO)    Take 120 mLs by mouth 3 (three) times daily.   OMEPRAZOLE (PRILOSEC) 20 MG CAPSULE    Take 20 mg by mouth daily.    PROBIOTIC PRODUCT (ALIGN) 4 MG CAPS    Take 1 capsule by mouth daily.   PROPRANOLOL (INDERAL) 20 MG TABLET    Take 20 mg by mouth 2 (two) times daily.   SPIRONOLACTONE (ALDACTONE) 25 MG TABLET    Take 25 mg by mouth daily.    VITAMIN D, ERGOCALCIFEROL, (DRISDOL) 50000 UNITS CAPS    Take 50,000 Units by mouth every 30 (thirty) days. On the first of the month  Modified Medications   No medications on file  Discontinued Medications   No medications on file    Physical Exam: Filed Vitals:   07/24/15 1058  BP: 126/94  Pulse: 73  Temp: 96.9 F (36.1 C)  TempSrc: Tympanic  Resp: 18   There is no weight on file to calculate BMI.  Physical Exam  Constitutional: She appears  well-developed.  HENT:  Head: Normocephalic and atraumatic.  Left Ear: External ear normal.  Eyes: Conjunctivae and EOM are normal. Pupils are equal, round, and reactive to light.  Neck: Neck supple. No JVD present. No thyromegaly present.  Cardiovascular: Normal rate and regular rhythm.   Murmur heard. Pulmonary/Chest: Effort normal. She has rales.  Bibasilar rales.   Abdominal: Soft. There is no tenderness. There is no rebound.  Musculoskeletal: She exhibits edema and tenderness.  Back pain is chronic which is well controlled. BLE chronic edema trace only. Left foot external deviated deformity.   Lymphadenopathy:    She has no cervical adenopathy.  Neurological: She is alert. No cranial nerve deficit. Coordination normal.  Skin: Skin is warm and dry. Rash noted.  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 R+L Intergluteal cleft recurrent pressure uinjuries in mirror image     Psychiatric: Her mood appears anxious. Her affect is not blunt, not labile and not inappropriate. Her speech is rapid and/or pressured (occasionally ). Her speech is not delayed and not slurred. She is hyperactive. She is not agitated, not aggressive, not slowed, not withdrawn, not actively hallucinating and not combative. Thought content is not paranoid and not delusional. She does not exhibit a depressed mood. She exhibits abnormal recent memory.  MMSE 21/30 08/2013    Labs reviewed: Basic Metabolic Panel:  Recent Labs  10/01/14 04/27/15  NA 136* 137  K 4.9 3.7  BUN 35* 28*  CREATININE 0.9 0.9    Liver Function Tests:  Recent Labs  10/01/14 04/27/15  AST 20 17  ALT 13 14  ALKPHOS 80 101    CBC:  Recent Labs  04/27/15 04/30/15 06/02/15 1455  WBC 6.8 6.4 7.7  NEUTROABS  --   --  5.2  HGB 17.3* 17.5* 16.5*  HCT 51* 51* 48.9*  MCV  --   --  98.2  PLT 220 208  216    Lab Results  Component Value Date   TSH 2.66 04/27/2015   Lab Results  Component Value Date   HGBA1C 8.7* 07/24/2015   Lab  Results  Component Value Date   CHOL  10/22/2010    100        ATP III CLASSIFICATION:  <200     mg/dL   Desirable  200-239  mg/dL   Borderline High  >=240    mg/dL   High          HDL 55 10/22/2010   LDLCALC  10/22/2010    29        Total Cholesterol/HDL:CHD Risk Coronary Heart Disease Risk Table                     Men   Women  1/2 Average Risk   3.4   3.3  Average Risk       5.0   4.4  2 X Average Risk   9.6   7.1  3 X Average Risk  23.4   11.0        Use the calculated Patient Ratio above and the CHD Risk Table to determine the patient's CHD Risk.        ATP III CLASSIFICATION (LDL):  <100     mg/dL   Optimal  100-129  mg/dL   Near or Above                    Optimal  130-159  mg/dL   Borderline  160-189  mg/dL   High  >190     mg/dL   Very High   TRIG 80 10/22/2010   CHOLHDL 1.8 10/22/2010    Significant Diagnostic Results since last visit: none  Patient Care Team: Reynold Bowen, MD as PCP - General (Endocrinology) Darlin Coco, MD (Cardiology) Arloa Koh, MD (Radiation Oncology) Neldon Mc, MD (General Surgery)  Assessment/Plan Problem List Items Addressed This Visit    Type 2 diabetes mellitus with diabetic neuropathy affecting both sides of body - Primary (Chronic)    Continue Lantus 18units daily, Novolog 5 u with meals for CBG>150 since 02/16/15. Last Hgb A1c 9.6 04/27/15, 07/24/15 Hgb A1c 8.7       Hypothyroidism (Chronic)    Continue Levothyroxine 112.50mcg. 04/27/15 TSH 2.663        HTN (hypertension) (Chronic)    Controlled, continue Losartan 25mg  daily, Atenolol 20mg  bid        Edema (Chronic)    Chronic venous insufficiency. Continue Furosemide 40mg  bid, Spironolctone25mg  daily. Trace edema BLE.        A-fib (Chronic)    02/03/14 doing well, EKG A-fib with controlled VR.  Heart rate is in control.         Dementia with behavioral disturbance (Chronic)    Continue Namenda 28mg  daily to preserve memory      Ischemic  heart disease    No angina since last visit, f/u Cardiology, continue Imdur 60mg , Prn NTG, ASA 81mg       Depression    06/24/14 dc Ativan: infrequently use(1 dose in 3 months) 06/29/15 Pharm decrease Cymbalta 30mg  qd-no anxiety or mood issues.  mood is stable.       Essential tremor    Mild head titubation and hands tremor-not disabling, continue Propranolol 10mg  bid      Back pain    Chronic issue, pain is controlled on narcotics      Constipation  Stable, continue colace 100 II qhs, Linzess 17mcg, MiraLax prn      Polycythemia    Hematology evaluation 05/2015, dx polycythemia with normal eryhtropoietein          Family/ staff Communication: continue SNF for care needs.   Labs/tests ordered:  Hgb A1c done 07/24/15  [facility] Niva Murren NP [facility] [address], [place] [address] On Call:  [phone] & follow prompts after 5pm & weekends Office Phone:  [phone] Office Fax:  [phone]

## 2015-07-24 NOTE — Assessment & Plan Note (Signed)
Controlled, continue Losartan 25mg  daily, Atenolol 20mg  bid

## 2015-07-24 NOTE — Assessment & Plan Note (Signed)
06/24/14 dc Ativan: infrequently use(1 dose in 3 months) 06/29/15 Pharm decrease Cymbalta 30mg  qd-no anxiety or mood issues.  mood is stable.

## 2015-07-24 NOTE — Assessment & Plan Note (Signed)
Hematology evaluation 05/2015, dx polycythemia with normal eryhtropoietein

## 2015-07-24 NOTE — Assessment & Plan Note (Signed)
No angina since last visit,  f/u Cardiology, continue with Imdur 60mg , Prn NTG, ASA 81mg 

## 2015-07-24 NOTE — Assessment & Plan Note (Signed)
Chronic venous insufficiency. Continue Furosemide 40mg  bid and Spironolctone25mg  daily. Trace edema BLE.

## 2015-07-24 NOTE — Assessment & Plan Note (Signed)
No angina since last visit, f/u Cardiology, continue Imdur 60mg , Prn NTG, ASA 81mg 

## 2015-07-24 NOTE — Assessment & Plan Note (Addendum)
Continue Lantus 18units daily, Novolog 5 u with meals for CBG>150 since 02/16/15. Last Hgb A1c 9.6 04/27/15, 07/24/15 Hgb A1c 8.7

## 2015-07-24 NOTE — Assessment & Plan Note (Addendum)
Chronic venous insufficiency. Continue Furosemide 40mg  bid, Spironolctone25mg  daily. Trace edema BLE.

## 2015-07-24 NOTE — Assessment & Plan Note (Addendum)
Continue Levothyroxine 112.52mcg. 04/27/15 TSH 2.663

## 2015-07-24 NOTE — Assessment & Plan Note (Addendum)
02/03/14 doing well, EKG A-fib with controlled VR.  Heart rate is in control.

## 2015-07-24 NOTE — Assessment & Plan Note (Signed)
Continue Namenda 28mg  daily to preserve memory,

## 2015-07-24 NOTE — Assessment & Plan Note (Signed)
Continue Namenda 28mg  daily to preserve memory

## 2015-07-24 NOTE — Assessment & Plan Note (Signed)
Chronic issue, pain is controlled on narcotics

## 2015-07-24 NOTE — Assessment & Plan Note (Signed)
Hematology evaluation 05/2015-dx polycythemia.

## 2015-07-24 NOTE — Assessment & Plan Note (Signed)
Stable, continue colace 100 II qhs, Linzess 161mcg, MiraLax prn

## 2015-07-24 NOTE — Assessment & Plan Note (Signed)
Stable, continue colace 100 II qhs, Linzess 151mcg, MiraLax prn.

## 2015-07-24 NOTE — Assessment & Plan Note (Signed)
Mild head titubation and hands tremor-not disabling. Continue Propranolol 10mg  bid

## 2015-07-24 NOTE — Assessment & Plan Note (Signed)
02/03/14 doing well, EKG A-fib with controlled VR.  Heart rate is in controlled, chronic Afib.

## 2015-07-24 NOTE — Assessment & Plan Note (Signed)
continue Levothyroxine 112.23mcg. 04/27/15 TSH 2.663

## 2015-07-24 NOTE — Assessment & Plan Note (Signed)
Mild head titubation and hands tremor-not disabling, continue Propranolol 10mg  bid

## 2015-07-24 NOTE — Assessment & Plan Note (Addendum)
Controlled, continue Losartan 25mg  daily, Atenolol 20mg  bid

## 2015-07-24 NOTE — Assessment & Plan Note (Signed)
Continue Lantus 18units daily and  Novolog 5 u with meals for CBG>150 since 02/16/15. Last Hgb A1c 9.6 04/27/15, 07/24/15 Hgb A1c 8.7

## 2015-07-31 ENCOUNTER — Encounter: Payer: Self-pay | Admitting: Nurse Practitioner

## 2015-07-31 ENCOUNTER — Non-Acute Institutional Stay (SKILLED_NURSING_FACILITY): Payer: Medicare Other | Admitting: Nurse Practitioner

## 2015-07-31 DIAGNOSIS — F0391 Unspecified dementia with behavioral disturbance: Secondary | ICD-10-CM

## 2015-07-31 DIAGNOSIS — M5440 Lumbago with sciatica, unspecified side: Secondary | ICD-10-CM

## 2015-07-31 DIAGNOSIS — F329 Major depressive disorder, single episode, unspecified: Secondary | ICD-10-CM

## 2015-07-31 DIAGNOSIS — E039 Hypothyroidism, unspecified: Secondary | ICD-10-CM | POA: Diagnosis not present

## 2015-07-31 DIAGNOSIS — R609 Edema, unspecified: Secondary | ICD-10-CM

## 2015-07-31 DIAGNOSIS — F03918 Unspecified dementia, unspecified severity, with other behavioral disturbance: Secondary | ICD-10-CM

## 2015-07-31 DIAGNOSIS — N390 Urinary tract infection, site not specified: Secondary | ICD-10-CM | POA: Diagnosis not present

## 2015-07-31 DIAGNOSIS — I10 Essential (primary) hypertension: Secondary | ICD-10-CM

## 2015-07-31 DIAGNOSIS — E1142 Type 2 diabetes mellitus with diabetic polyneuropathy: Secondary | ICD-10-CM | POA: Diagnosis not present

## 2015-07-31 DIAGNOSIS — I482 Chronic atrial fibrillation, unspecified: Secondary | ICD-10-CM

## 2015-07-31 DIAGNOSIS — F32A Depression, unspecified: Secondary | ICD-10-CM

## 2015-07-31 NOTE — Assessment & Plan Note (Signed)
Continue Lantus 18units daily, Novolog 5 u with meals for CBG>150 since 02/16/15. Last Hgb A1c 9.6 04/27/15, 07/24/15 Hgb A1c 8.7

## 2015-07-31 NOTE — Progress Notes (Signed)
Patient ID: [name], female   DOB: Oct 12, 1926, 81 y.o.   MRN: [id number]  Location:  SNF [facility]:  Marlana Latus NP  Code Status:  DNR Goals of care: Advanced Directive information    Chief Complaint  Patient presents with  . Medical Management of Chronic Issues  . Acute Visit    UTI     HPI: Patient is a 81 y.o. female seen in the SNF at [facility] today for evaluation of  UTI, 07/30/15 revealed E. Coli >100,000c/ml, she c/o aches and pain allover her, attributed her pain to be beaten last night which staff stated it was not true. She is afebrile, denied nausea, vomiting, or abd pain.    Review of Systems:  Review of Systems  Constitutional: Negative for fever, chills and diaphoresis.       C/o aches allover  HENT: Positive for hearing loss. Negative for congestion, ear discharge and ear pain.   Eyes: Negative for pain, discharge and redness.  Respiratory: Negative for cough, shortness of breath and wheezing.   Cardiovascular: Positive for leg swelling. Negative for chest pain and palpitations.  Gastrointestinal: Negative for nausea, vomiting, abdominal pain and constipation.  Genitourinary: Positive for frequency. Negative for dysuria and urgency.       Recurrent urinary tract infections  Musculoskeletal: Positive for back pain. Negative for myalgias and neck pain.  Skin: Positive for rash.       Long standing excoriated buttocks with pressure ulcers L+R buttocks  Neurological: Negative for dizziness, tremors, seizures, weakness and headaches.  Endo/Heme/Allergies: Negative for polydipsia.  Psychiatric/Behavioral: Positive for memory loss. Negative for suicidal ideas and hallucinations. The patient is nervous/anxious.        More confused    Past Medical History  Diagnosis Date  . Diabetes mellitus   . Hypertension   . Heart murmur   . Benign familial tremor   . Rosacea   . Diverticulitis   . Heart failure     diastolic heart failure  . TIA (transient  ischemic attack) 2010  . Arthritis   . Breast cancer     breast -rt  . CAD (coronary artery disease)     single vessel CAD per cath in 2006; scattered nonobstructive disease in the left system, left to right collaterals to the RCA which was occluded by flush shots; she has been managed medically    Patient Active Problem List   Diagnosis Date Noted  . Polycythemia 06/05/2015  . Erythrocytosis 05/01/2015  . Controlled type 2 DM with peripheral circulatory disorder 11/04/2014  . Herpes zoster 09/30/2014  . Right-sided chest wall pain 09/30/2014  . Plantar fasciitis of left foot 07/18/2014  . Fecal incontinence 06/10/2014  . Left leg pain 04/18/2014  . Other malaise and fatigue 04/18/2014  . UTI (urinary tract infection) 02/09/2014  . Venous stasis dermatitis of both lower extremities 12/10/2013  . Candidiasis of skin 09/10/2013  . Dementia with behavioral disturbance 08/23/2013  . Right hip pain 08/13/2013  . PAF (paroxysmal atrial fibrillation) 07/26/2013  . A-fib 07/23/2013  . Edema 06/27/2013  . Ulcer of foot due to diabetes mellitus 06/27/2013  . Pressure ulcer stage II 06/18/2013  . Falls frequently 04/23/2013  . DNR (do not resuscitate) 04/23/2013  . Type 2 diabetes mellitus with diabetic neuropathy affecting both sides of body 02/26/2013  . Hypothyroidism 02/26/2013  . Depression 02/26/2013  . Essential tremor 02/26/2013  . Back pain 02/26/2013  . HTN (hypertension) 02/26/2013  . Constipation 02/26/2013  .  Ischemic heart disease 10/02/2012  . Leg swelling 10/02/2012  . Dyspepsia 10/02/2012  . Diabetic foot ulcer 07/15/2011  . 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 Penicillins Other (See Comments)    Per MAR  . Bee Venom Other (See Comments)    Per MAR  . Celebrex [Celecoxib] Other (See Comments)    Per MAR  . Lidocaine Hcl Other (See Comments)    Per MAR  . Phenobarbital Other (See Comments)    Per MAR  .  Procaine Hcl Other (See Comments)    Per mar    Medications: Patient's Medications  New Prescriptions   No medications on file  Previous Medications   ACETAMINOPHEN (TYLENOL) 325 MG TABLET    Take 2 tablets (650 mg total) by mouth every 4 (four) hours as needed.   ALUM & MAG HYDROXIDE-SIMETH (MAALOX/MYLANTA) 200-200-20 MG/5ML SUSPENSION    Take 30 mLs by mouth every 6 (six) hours as needed for indigestion or heartburn.   ASPIRIN EC 81 MG TABLET    Take 81 mg by mouth daily.   AYR SALINE NASAL NO-DRIP GEL    Place into the nose 2 (two) times daily as needed (nose bleeds).   DOCUSATE SODIUM (COLACE) 100 MG CAPSULE    Take 200 mg by mouth at bedtime.    DORZOLAMIDE-TIMOLOL (COSOPT) 22.3-6.8 MG/ML OPHTHALMIC SOLUTION    Place 1 drop into the left eye every 12 (twelve) hours.   DULOXETINE (CYMBALTA) 30 MG CAPSULE    Take 30 mg by mouth 2 (two) times daily.   FENTANYL (DURAGESIC - DOSED MCG/HR) 100 MCG/HR    Apply one patch topically every 3 days for pain. Remove old patch before applying new patch.   FUROSEMIDE (LASIX) 40 MG TABLET    Take 40 mg by mouth 2 (two) times daily.    GLUCAGON (GLUCAGEN) 1 MG SOLR INJECTION    Inject 1 mg into the vein once as needed for low blood sugar.   GLUCOSAMINE-CHONDROITIN 500-400 MG TABLET    Take 1 tablet by mouth 3 (three) times daily.   GUAIFENESIN (ROBITUSSIN) 100 MG/5ML SYRUP    Take 300 mg by mouth 3 (three) times daily as needed for cough.   HYDROXYZINE (ATARAX/VISTARIL) 25 MG TABLET    Take 25 mg by mouth at bedtime as needed. For itching   INSULIN ASPART (NOVOLOG) 100 UNIT/ML INJECTION    Inject 5 Units into the skin 3 (three) times daily with meals. 8 units If CBG is greater than 150 at lunch and 12 units if CBG is greater than 150 at breakfast and dinner   INSULIN GLARGINE (LANTUS) 100 UNIT/ML INJECTION    Inject 18 Units into the skin at bedtime.    ISOSORBIDE MONONITRATE (IMDUR) 60 MG 24 HR TABLET    Take 60 mg by mouth every morning.     LEVOTHYROXINE (SYNTHROID, LEVOTHROID) 75 MCG TABLET    Take 75-112.5 mcg by mouth daily. Take 1 and 1/2 tablets on Saturdays then 1 tablet all other days   LINZESS 145 MCG CAPS CAPSULE    Take 145 mcg by mouth daily. Take 30 mins prior to breakfast on an empty stomach.   LORATADINE (CLARITIN) 10 MG TABLET    Take 10 mg by mouth daily.    LOSARTAN (COZAAR) 25 MG TABLET    Take 25 mg by mouth daily.   LUBIPROSTONE (AMITIZA) 24 MCG CAPSULE    Take 24 mcg by  mouth 2 (two) times daily with a meal.     MEMANTINE HCL ER (NAMENDA XR) 28 MG CP24    Take by mouth.   METHYLCELLULOSE (ARTIFICIAL TEARS) 1 % OPHTHALMIC SOLUTION    Place 1 drop into both eyes 4 (four) times daily as needed (for dryness).   MULTIPLE VITAMINS-MINERALS (CERTAVITE SENIOR/ANTIOXIDANT PO)    Take 1 tablet by mouth daily.   NITROGLYCERIN (NITROSTAT) 0.4 MG SL TABLET    Place 0.4 mg under the tongue every 5 (five) minutes as needed. For chest pain   NUTRITIONAL SUPPLEMENTS (RESOURCE 2.0 PO)    Take 120 mLs by mouth 3 (three) times daily.   OMEPRAZOLE (PRILOSEC) 20 MG CAPSULE    Take 20 mg by mouth daily.    PROBIOTIC PRODUCT (ALIGN) 4 MG CAPS    Take 1 capsule by mouth daily.   PROPRANOLOL (INDERAL) 20 MG TABLET    Take 20 mg by mouth 2 (two) times daily.   SPIRONOLACTONE (ALDACTONE) 25 MG TABLET    Take 25 mg by mouth daily.    VITAMIN D, ERGOCALCIFEROL, (DRISDOL) 50000 UNITS CAPS    Take 50,000 Units by mouth every 30 (thirty) days. On the first of the month  Modified Medications   No medications on file  Discontinued Medications   No medications on file    Physical Exam: Filed Vitals:   07/31/15 1259  BP: 138/76  Pulse: 82  Temp: 97.7 F (36.5 C)  TempSrc: Tympanic  Resp: 18   There is no weight on file to calculate BMI.  Physical Exam  Constitutional: She appears well-developed.  HENT:  Head: Normocephalic and atraumatic.  Left Ear: External ear normal.  Eyes: Conjunctivae and EOM are normal. Pupils are equal,  round, and reactive to light.  Neck: Neck supple. No JVD present. No thyromegaly present.  Cardiovascular: Normal rate and regular rhythm.   Murmur heard. Pulmonary/Chest: Effort normal. She has rales.  Bibasilar rales.   Abdominal: Soft. There is no tenderness. There is no rebound.  Musculoskeletal: She exhibits edema and tenderness.  Back pain is chronic which is well controlled. BLE chronic edema trace only. Left foot external deviated deformity.   Lymphadenopathy:    She has no cervical adenopathy.  Neurological: She is alert. No cranial nerve deficit. Coordination normal.  Skin: Skin is warm and dry. Rash noted.  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 R+L Intergluteal cleft recurrent pressure uinjuries in mirror image  Small open areas lateral left lower leg, anterior right lower leg   Psychiatric: Her mood appears anxious. Her affect is not blunt, not labile and not inappropriate. Her speech is rapid and/or pressured (occasionally ). Her speech is not delayed and not slurred. She is hyperactive. She is not agitated, not aggressive, not slowed, not withdrawn, not actively hallucinating and not combative. Thought content is not paranoid and not delusional. She does not exhibit a depressed mood. She exhibits abnormal recent memory.  MMSE 21/30 08/2013    Labs reviewed: Basic Metabolic Panel:  Recent Labs  10/01/14 04/27/15  NA 136* 137  K 4.9 3.7  BUN 35* 28*  CREATININE 0.9 0.9    Liver Function Tests:  Recent Labs  10/01/14 04/27/15  AST 20 17  ALT 13 14  ALKPHOS 80 101    CBC:  Recent Labs  04/27/15 04/30/15 06/02/15 1455  WBC 6.8 6.4 7.7  NEUTROABS  --   --  5.2  HGB 17.3* 17.5* 16.5*  HCT 51* 51* 48.9*  MCV  --   --  98.2  PLT 220 208 216    Lab Results  Component Value Date   TSH 2.66 04/27/2015   Lab Results  Component Value Date   HGBA1C 8.7* 07/24/2015   Lab Results  Component Value Date   CHOL  10/22/2010    100        ATP III CLASSIFICATION:   <200     mg/dL   Desirable  200-239  mg/dL   Borderline High  >=240    mg/dL   High          HDL 55 10/22/2010   LDLCALC  10/22/2010    29        Total Cholesterol/HDL:CHD Risk Coronary Heart Disease Risk Table                     Men   Women  1/2 Average Risk   3.4   3.3  Average Risk       5.0   4.4  2 X Average Risk   9.6   7.1  3 X Average Risk  23.4   11.0        Use the calculated Patient Ratio above and the CHD Risk Table to determine the patient's CHD Risk.        ATP III CLASSIFICATION (LDL):  <100     mg/dL   Optimal  100-129  mg/dL   Near or Above                    Optimal  130-159  mg/dL   Borderline  160-189  mg/dL   High  >190     mg/dL   Very High   TRIG 80 10/22/2010   CHOLHDL 1.8 10/22/2010    Significant Diagnostic Results since last visit: none  Patient Care Team: Reynold Bowen, MD as PCP - General (Endocrinology) Darlin Coco, MD (Cardiology) Arloa Koh, MD (Radiation Oncology) Neldon Mc, MD (General Surgery)  Assessment/Plan Problem List Items Addressed This Visit    Type 2 diabetes mellitus with diabetic neuropathy affecting both sides of body (Chronic)    Continue Lantus 18units daily, Novolog 5 u with meals for CBG>150 since 02/16/15. Last Hgb A1c 9.6 04/27/15, 07/24/15 Hgb A1c 8.7      Hypothyroidism (Chronic)    Continue Levothyroxine 112.39mcg. 04/27/15 TSH 2.663      HTN (hypertension) (Chronic)    Controlled, continue Losartan 25mg  daily, Atenolol 20mg  bid      Edema (Chronic)    Chronic venous insufficiency. Continue Furosemide 40mg  bid, Spironolctone25mg  daily. Trace edema BLE.      A-fib (Chronic)    02/03/14 doing well, EKG A-fib with controlled VR.  Heart rate is in control.       Dementia with behavioral disturbance (Chronic)    Apparent she is more confused than usual, likely related to onset of UTI, observe the patient. Continue Namenda 28mg  daily to preserve memory      UTI (urinary tract infection) -  Primary (Chronic)    07/30/15 urine culture E. Coli Nitrofurantoin 100mg  bid x 7 days. Aches allover and more confused complained. Observe the patient.       Depression    Seems more anxiety, likely related to onset of UTI, but possible related to GDR Cymbalta 30mg  since 06/29/15. Observe.       Back pain    Chronic issue, pain is controlled on narcotics          Family/ staff Communication: continue  SNF for care needs, improvement of S/S of UTI.   Labs/tests ordered:  none  ManXie Mast NP Geriatrics [facility] [address] [place],  [address] On Call:  [phone] & follow prompts after 5pm & weekends Office Phone:  [phone] Office Fax:  [phone]

## 2015-07-31 NOTE — Assessment & Plan Note (Signed)
Chronic venous insufficiency. Continue Furosemide 40mg  bid, Spironolctone25mg  daily. Trace edema BLE.

## 2015-07-31 NOTE — Assessment & Plan Note (Signed)
02/03/14 doing well, EKG A-fib with controlled VR.  Heart rate is in control.

## 2015-07-31 NOTE — Assessment & Plan Note (Signed)
07/30/15 urine culture E. Coli Nitrofurantoin 100mg  bid x 7 days. Aches allover and more confused complained. Observe the patient.

## 2015-07-31 NOTE — Assessment & Plan Note (Signed)
Apparent she is more confused than usual, likely related to onset of UTI, observe the patient. Continue Namenda 28mg  daily to preserve memory

## 2015-07-31 NOTE — Assessment & Plan Note (Signed)
Controlled, continue Losartan 25mg  daily, Atenolol 20mg  bid

## 2015-07-31 NOTE — Assessment & Plan Note (Signed)
Chronic issue, pain is controlled on narcotics

## 2015-07-31 NOTE — Assessment & Plan Note (Signed)
Continue Levothyroxine 112.57mcg. 04/27/15 TSH 2.663

## 2015-07-31 NOTE — Assessment & Plan Note (Signed)
Seems more anxiety, likely related to onset of UTI, but possible related to GDR Cymbalta 30mg  since 06/29/15. Observe.

## 2015-08-07 ENCOUNTER — Encounter: Payer: Self-pay | Admitting: Internal Medicine

## 2015-08-07 ENCOUNTER — Non-Acute Institutional Stay (SKILLED_NURSING_FACILITY): Payer: Medicare Other | Admitting: Internal Medicine

## 2015-08-07 DIAGNOSIS — I798 Other disorders of arteries, arterioles and capillaries in diseases classified elsewhere: Secondary | ICD-10-CM | POA: Diagnosis not present

## 2015-08-07 DIAGNOSIS — R238 Other skin changes: Secondary | ICD-10-CM | POA: Diagnosis not present

## 2015-08-07 DIAGNOSIS — R609 Edema, unspecified: Secondary | ICD-10-CM

## 2015-08-07 DIAGNOSIS — D751 Secondary polycythemia: Secondary | ICD-10-CM

## 2015-08-07 DIAGNOSIS — I10 Essential (primary) hypertension: Secondary | ICD-10-CM | POA: Diagnosis not present

## 2015-08-07 DIAGNOSIS — S8002XD Contusion of left knee, subsequent encounter: Secondary | ICD-10-CM

## 2015-08-07 DIAGNOSIS — S8002XA Contusion of left knee, initial encounter: Secondary | ICD-10-CM | POA: Insufficient documentation

## 2015-08-07 DIAGNOSIS — E1151 Type 2 diabetes mellitus with diabetic peripheral angiopathy without gangrene: Secondary | ICD-10-CM | POA: Diagnosis not present

## 2015-08-07 NOTE — Progress Notes (Signed)
Patient ID: [name], female   DOB: 11/22/1925, 81 y.o.   MRN: [id number]    [place] Room Number: N 2  Place of Service: SNF (31)     Allergies  Allergen Reactions  . Penicillins Other (See Comments)    Per MAR  . Bee Venom Other (See Comments)    Per MAR  . Celebrex [Celecoxib] Other (See Comments)    Per MAR  . Lidocaine Hcl Other (See Comments)    Per MAR  . Phenobarbital Other (See Comments)    Per MAR  . Procaine Hcl Other (See Comments)    Per mar    Chief Complaint  Patient presents with  . Acute Visit    Follow-up of blister areas on both legs and hematoma above the left knee    HPI:  Blisters of multiple sites - patient was seen initially for this 07/31/2015. This is a follow-up visit. She has very thin skin of both lower legs related to her chronic venous insufficiency and swelling. She had spontaneous blisters appear about the size of a silver dollar on each lower legs. The list from the left leg was artery rupture penicillin last week. Since last week the blister on the right leg has ruptured as well. Both blisters are more comfortable than they were last week. Silver sulfadiazine was ordered and has been applied daily. There is good healing bilaterally. There is slightly less edema of both legs than appeared last week.  Traumatic hematoma of left knee, subsequent encounter - patient had fallen last week and sustained a large hematoma above the left knee. It was quite tender. It is now less tender and is softening as compared to last week. Diclofenac gel was ordered last week and has been applied to the hematoma daily. He seems to be helping pain control.  Polycythemia - patient has been seen by Dr. Beryle Beams. He does not feel that any particular testing is mandatory in this patient. We will continue to follow blood counts periodically.  Edema - patient has known mild renal insufficiency. We will continue to monitor edema. No additional medications  seem necessary at this time.  Essential hypertension - controlled  Controlled type 2 DM with peripheral circulatory disorder - patient has been somewhat brittle. She has poor dietary compliance. Overall control is not good. Hemoglobin A1c was 9.6 earlier this year, but the most recent one was down to 8.7. She has had some episodes of hypoglycemia in the recent past.    Medications: Patient's Medications  New Prescriptions   No medications on file  Previous Medications   ACETAMINOPHEN (TYLENOL) 325 MG TABLET    Take 2 tablets (650 mg total) by mouth every 4 (four) hours as needed.   ALUM & MAG HYDROXIDE-SIMETH (MAALOX/MYLANTA) 200-200-20 MG/5ML SUSPENSION    Take 30 mLs by mouth every 6 (six) hours as needed for indigestion or heartburn.   ASPIRIN EC 81 MG TABLET    Take 81 mg by mouth daily.   AYR SALINE NASAL NO-DRIP GEL    Place into the nose 2 (two) times daily as needed (nose bleeds).   DOCUSATE SODIUM (COLACE) 100 MG CAPSULE    Take 200 mg by mouth at bedtime.    DORZOLAMIDE-TIMOLOL (COSOPT) 22.3-6.8 MG/ML OPHTHALMIC SOLUTION    Place 1 drop into the left eye every 12 (twelve) hours.   DULOXETINE (CYMBALTA) 30 MG CAPSULE    Take 30 mg by mouth 2 (two) times daily.   FENTANYL (  DURAGESIC - DOSED MCG/HR) 100 MCG/HR    Apply one patch topically every 3 days for pain. Remove old patch before applying new patch.   FUROSEMIDE (LASIX) 40 MG TABLET    Take 40 mg by mouth 2 (two) times daily.    GLUCAGON (GLUCAGEN) 1 MG SOLR INJECTION    Inject 1 mg into the vein once as needed for low blood sugar.   GLUCOSAMINE-CHONDROITIN 500-400 MG TABLET    Take 1 tablet by mouth 3 (three) times daily.   GUAIFENESIN (ROBITUSSIN) 100 MG/5ML SYRUP    Take 300 mg by mouth 3 (three) times daily as needed for cough.   HYDROXYZINE (ATARAX/VISTARIL) 25 MG TABLET    Take 25 mg by mouth at bedtime as needed. For itching   INSULIN ASPART (NOVOLOG) 100 UNIT/ML INJECTION    Inject 5 Units into the skin 3 (three) times  daily with meals. 8 units If CBG is greater than 150 at lunch and 12 units if CBG is greater than 150 at breakfast and dinner   INSULIN GLARGINE (LANTUS) 100 UNIT/ML INJECTION    Inject 18 Units into the skin at bedtime.    ISOSORBIDE MONONITRATE (IMDUR) 60 MG 24 HR TABLET    Take 60 mg by mouth every morning.    LEVOTHYROXINE (SYNTHROID, LEVOTHROID) 75 MCG TABLET    Take 75-112.5 mcg by mouth daily. Take 1 and 1/2 tablets on Saturdays then 1 tablet all other days   LINZESS 145 MCG CAPS CAPSULE    Take 145 mcg by mouth daily. Take 30 mins prior to breakfast on an empty stomach.   LORATADINE (CLARITIN) 10 MG TABLET    Take 10 mg by mouth daily.    LOSARTAN (COZAAR) 25 MG TABLET    Take 25 mg by mouth daily.   LUBIPROSTONE (AMITIZA) 24 MCG CAPSULE    Take 24 mcg by mouth 2 (two) times daily with a meal.     MEMANTINE HCL ER (NAMENDA XR) 28 MG CP24    Take by mouth.   METHYLCELLULOSE (ARTIFICIAL TEARS) 1 % OPHTHALMIC SOLUTION    Place 1 drop into both eyes 4 (four) times daily as needed (for dryness).   MULTIPLE VITAMINS-MINERALS (CERTAVITE SENIOR/ANTIOXIDANT PO)    Take 1 tablet by mouth daily.   NITROGLYCERIN (NITROSTAT) 0.4 MG SL TABLET    Place 0.4 mg under the tongue every 5 (five) minutes as needed. For chest pain   NUTRITIONAL SUPPLEMENTS (RESOURCE 2.0 PO)    Take 120 mLs by mouth 3 (three) times daily.   OMEPRAZOLE (PRILOSEC) 20 MG CAPSULE    Take 20 mg by mouth daily.    PROBIOTIC PRODUCT (ALIGN) 4 MG CAPS    Take 1 capsule by mouth daily.   PROPRANOLOL (INDERAL) 20 MG TABLET    Take 20 mg by mouth 2 (two) times daily.   SPIRONOLACTONE (ALDACTONE) 25 MG TABLET    Take 25 mg by mouth daily.    VITAMIN D, ERGOCALCIFEROL, (DRISDOL) 50000 UNITS CAPS    Take 50,000 Units by mouth every 30 (thirty) days. On the first of the month  Modified Medications   No medications on file  Discontinued Medications   No medications on file     Review of Systems  Constitutional: Negative for fever, chills  and diaphoresis.       Generalized aching and discomfort. Overweight.  HENT: Positive for hearing loss. Negative for congestion, ear discharge, ear pain, nosebleeds, sore throat and tinnitus.   Eyes: Negative for  photophobia, pain, discharge and redness.  Respiratory: Negative for cough, shortness of breath, wheezing and stridor.   Cardiovascular: Positive for leg swelling. Negative for chest pain and palpitations.  Gastrointestinal: Negative for nausea, vomiting, abdominal pain, diarrhea, constipation and blood in stool.  Endocrine: Negative for polydipsia.       Diabetic with circulatory and neurologic complications  Genitourinary: Positive for frequency. Negative for dysuria, urgency, hematuria and flank pain.       Recurrent urinary tract infections  Musculoskeletal: Positive for back pain. Negative for myalgias and neck pain.       Large hematoma above the left knee is beginning to soften as compared to last week area  Skin: Positive for rash.       Silver dollar size area that was blistered on both lower legs. Was recently ruptured. There is no infection. There is good granulation tissue. The edges of these wounds are beginning to show signs of new tissue growth.  Allergic/Immunologic: Negative for environmental allergies.  Neurological: Negative for dizziness, tremors, seizures, weakness and headaches.  Psychiatric/Behavioral: Negative for suicidal ideas and hallucinations. The patient is nervous/anxious.     Filed Vitals:   08/07/15 1110  BP: 125/72  Pulse: 91  Temp: 98.6 F (37 C)  Resp: 20  Height: _0  (1.6 m)  Weight: 191 lb (86.637 kg)   Body mass index is 33.84 kg/(m^2).  Physical Exam  Constitutional: She appears well-developed.  HENT:  Head: Normocephalic and atraumatic.  Left Ear: External ear normal.  Eyes: Conjunctivae and EOM are normal. Pupils are equal, round, and reactive to light.  Corrective lenses  Neck: Neck supple. No JVD present. No thyromegaly  present.  Cardiovascular: Normal rate and regular rhythm.   Murmur heard. Pulmonary/Chest: Effort normal. She has rales.  Bibasilar rales.   Abdominal: Soft. There is no tenderness. There is no rebound.  Musculoskeletal: She exhibits edema and tenderness.  Back pain is chronic which is well controlled. BLE chronic edema trace only. Left foot external deviated deformity.  Large hematoma above the left knee.  Lymphadenopathy:    She has no cervical adenopathy.  Neurological: She is alert. No cranial nerve deficit. Coordination normal.  Dementia  Skin: Skin is warm and dry. Rash noted.  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 R+L Intergluteal cleft recurrent pressure injuries in mirror image. Bilateral areas of both lower legs where she had spontaneous blisters occurred that have now ruptured. There is good granulation tissue and signs of healing without infection.   Psychiatric: Her mood appears anxious. Her affect is not blunt, not labile and not inappropriate. Her speech is rapid and/or pressured (occasionally ). Her speech is not delayed and not slurred. She is hyperactive. She is not agitated, not aggressive, not slowed, not withdrawn, not actively hallucinating and not combative. Thought content is not paranoid and not delusional. She does not exhibit a depressed mood. She exhibits abnormal recent memory.  MMSE 21/30 08/2013 Confused     Labs reviewed: Lab Summary Latest Ref Rng 06/02/2015 04/30/2015 04/27/2015 10/01/2014  Hemoglobin 12.0 - 15.0 g/dL 16.5(H) 17.5(A) 17.3(A) 14.5  Hematocrit 36.0 - 46.0 % 48.9(H) 51(A) 51(A) 43  White count 4.0 - 10.5 K/uL 7.7 6.4 6.8 6.6  Platelet count 150 - 400 K/uL 216 208 220 229  Sodium 137 - 147 mmol/L (None) (None) 137 136(A)  Potassium 3.4 - 5.3 mmol/L (None) (None) 3.7 4.9  Calcium - (None) (None) (None) (None)  Phosphorus - (None) (None) (None) (None)  Creatinine 0.5 - 1.1  mg/dL (None) (None) 0.9 0.9  AST 13 - 35 U/L (None) (None) 17 20  Alk  Phos 25 - 125 U/L (None) (None) 101 80  Bilirubin - (None) (None) (None) (None)  Glucose - (None) (None) 149 94  Cholesterol - (None) (None) (None) (None)  HDL cholesterol - (None) (None) (None) (None)  Triglycerides - (None) (None) (None) (None)  LDL Direct - (None) (None) (None) (None)  LDL Calc - (None) (None) (None) (None)  Total protein - (None) (None) (None) (None)  Albumin - (None) (None) (None) (None)   Lab Results  Component Value Date   TSH 2.66 04/27/2015   Lab Results  Component Value Date   BUN 28* 04/27/2015   Lab Results  Component Value Date   HGBA1C 8.7* 07/24/2015       Assessment/Plan  1. Blisters of multiple sites Continue SSD cream and daily bandaging  2. Traumatic hematoma of left knee, subsequent encounter This area should ultimately resolved without further intervention. Continue diclofenac gel.  3. Polycythemia Monitor periodically. This patient is elderly, fragile, confused, and without any issues that I can relate to her polycythemia. I'm not in favor of any further testing.  4. Edema Chronic and bilateral lower leg and foot edema at about 3+  5. Essential hypertension Controlled  6. Controlled type 2 DM with peripheral circulatory disorder Overall poor control. Insulin using. Brittle due to dietary noncompliance.

## 2015-08-17 ENCOUNTER — Encounter: Payer: Self-pay | Admitting: Internal Medicine

## 2015-08-17 NOTE — Progress Notes (Signed)
This encounter was created in error - please disregard.

## 2015-08-25 ENCOUNTER — Non-Acute Institutional Stay (SKILLED_NURSING_FACILITY): Payer: Medicare Other | Admitting: Nurse Practitioner

## 2015-08-25 ENCOUNTER — Encounter: Payer: Self-pay | Admitting: Nurse Practitioner

## 2015-08-25 DIAGNOSIS — F329 Major depressive disorder, single episode, unspecified: Secondary | ICD-10-CM

## 2015-08-25 DIAGNOSIS — I10 Essential (primary) hypertension: Secondary | ICD-10-CM | POA: Diagnosis not present

## 2015-08-25 DIAGNOSIS — E039 Hypothyroidism, unspecified: Secondary | ICD-10-CM | POA: Diagnosis not present

## 2015-08-25 DIAGNOSIS — D751 Secondary polycythemia: Secondary | ICD-10-CM | POA: Diagnosis not present

## 2015-08-25 DIAGNOSIS — R609 Edema, unspecified: Secondary | ICD-10-CM

## 2015-08-25 DIAGNOSIS — F0391 Unspecified dementia with behavioral disturbance: Secondary | ICD-10-CM | POA: Diagnosis not present

## 2015-08-25 DIAGNOSIS — M5440 Lumbago with sciatica, unspecified side: Secondary | ICD-10-CM | POA: Diagnosis not present

## 2015-08-25 DIAGNOSIS — K219 Gastro-esophageal reflux disease without esophagitis: Secondary | ICD-10-CM | POA: Diagnosis not present

## 2015-08-25 DIAGNOSIS — E1142 Type 2 diabetes mellitus with diabetic polyneuropathy: Secondary | ICD-10-CM

## 2015-08-25 DIAGNOSIS — I482 Chronic atrial fibrillation, unspecified: Secondary | ICD-10-CM

## 2015-08-25 DIAGNOSIS — K5900 Constipation, unspecified: Secondary | ICD-10-CM

## 2015-08-25 DIAGNOSIS — F03918 Unspecified dementia, unspecified severity, with other behavioral disturbance: Secondary | ICD-10-CM

## 2015-08-25 DIAGNOSIS — F32A Depression, unspecified: Secondary | ICD-10-CM

## 2015-08-25 DIAGNOSIS — G250 Essential tremor: Secondary | ICD-10-CM | POA: Diagnosis not present

## 2015-08-25 NOTE — Assessment & Plan Note (Signed)
Stable since UTI fully treated, continue Cymbalta 30mg  since 06/29/15.

## 2015-08-25 NOTE — Progress Notes (Signed)
Patient ID: [name], female   DOB: 19-Jun-1926, 81 y.o.   MRN: [id number]  Location:  SNF [facility] Provider:  Marlana Latus NP  Code Status:  DNR Goals of care: Advanced Directive information    Chief Complaint  Patient presents with  . Medical Management of Chronic Issues     HPI: Patient is a 81 y.o. female seen in the SNF at [facility] today for evaluation of blood sugar, runs <100s ac breakfast, 200s at lunch and dinner. Mostly not getting am insulin coverage.   Review of Systems:  Review of Systems  Constitutional: Negative for fever, chills and diaphoresis.       C/o aches allover  HENT: Positive for hearing loss. Negative for congestion, ear discharge and ear pain.   Eyes: Negative for pain, discharge and redness.  Respiratory: Negative for cough, shortness of breath and wheezing.   Cardiovascular: Positive for leg swelling. Negative for chest pain and palpitations.  Gastrointestinal: Negative for nausea, vomiting, abdominal pain and constipation.  Genitourinary: Positive for frequency. Negative for dysuria and urgency.       Recurrent urinary tract infections  Musculoskeletal: Positive for back pain. Negative for myalgias and neck pain.  Skin: Positive for rash.       Long standing excoriated buttocks with pressure ulcers L+R buttocks  Neurological: Negative for dizziness, tremors, seizures, weakness and headaches.  Endo/Heme/Allergies: Negative for polydipsia.  Psychiatric/Behavioral: Positive for memory loss. Negative for suicidal ideas and hallucinations. The patient is nervous/anxious.        More confused    Past Medical History  Diagnosis Date  . Diabetes mellitus   . Hypertension   . Heart murmur   . Benign familial tremor   . Rosacea   . Diverticulitis   . Heart failure     diastolic heart failure  . TIA (transient ischemic attack) 2010  . Arthritis   . Breast cancer ([place])     breast -rt  . CAD (coronary artery disease)     single vessel CAD per cath  in 2006; scattered nonobstructive disease in the left system, left to right collaterals to the RCA which was occluded by flush shots; she has been managed medically  . Polycythemia 06/05/2015    Normal erythropoietin; O2 sat 95% room air     Patient Active Problem List   Diagnosis Date Noted  . GERD (gastroesophageal reflux disease) 08/25/2015  . Blisters of multiple sites 08/07/2015  . Traumatic hematoma of left knee 08/07/2015  . Polycythemia 06/05/2015  . Erythrocytosis 05/01/2015  . Controlled type 2 DM with peripheral circulatory disorder ([place]) 11/04/2014  . Herpes zoster 09/30/2014  . Right-sided chest wall pain 09/30/2014  . Plantar fasciitis of left foot 07/18/2014  . Fecal incontinence 06/10/2014  . Left leg pain 04/18/2014  . Other malaise and fatigue 04/18/2014  . UTI (urinary tract infection) 02/09/2014  . Venous stasis dermatitis of both lower extremities 12/10/2013  . Candidiasis of skin 09/10/2013  . Dementia with behavioral disturbance 08/23/2013  . Right hip pain 08/13/2013  . PAF (paroxysmal atrial fibrillation) ([place]) 07/26/2013  . A-fib ([place]) 07/23/2013  . Edema 06/27/2013  . Ulcer of foot due to diabetes mellitus ([place]) 06/27/2013  . Pressure ulcer stage II 06/18/2013  . Falls frequently 04/23/2013  . DNR (do not resuscitate) 04/23/2013  . Type 2 diabetes mellitus with diabetic neuropathy affecting both sides of body ([place]) 02/26/2013  . Hypothyroidism 02/26/2013  . Depression 02/26/2013  . Essential tremor 02/26/2013  .  Back pain 02/26/2013  . HTN (hypertension) 02/26/2013  . Constipation 02/26/2013  . Ischemic heart disease 10/02/2012  . Leg swelling 10/02/2012  . Dyspepsia 10/02/2012  . Diabetic foot ulcer ([place]) 07/15/2011  . 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 Penicillins Other (See Comments)    Per MAR  . Bee Venom Other (See Comments)    Per MAR  . Celebrex [Celecoxib] Other (See Comments)     Per MAR  . Lidocaine Hcl Other (See Comments)    Per MAR  . Phenobarbital Other (See Comments)    Per MAR  . Procaine Hcl Other (See Comments)    Per mar    Medications: Patient's Medications  New Prescriptions   No medications on file  Previous Medications   ACETAMINOPHEN (TYLENOL) 325 MG TABLET    Take 2 tablets (650 mg total) by mouth every 4 (four) hours as needed.   ALUM & MAG HYDROXIDE-SIMETH (MAALOX/MYLANTA) 200-200-20 MG/5ML SUSPENSION    Take 30 mLs by mouth every 6 (six) hours as needed for indigestion or heartburn.   ASPIRIN EC 81 MG TABLET    Take 81 mg by mouth daily.   AYR SALINE NASAL NO-DRIP GEL    Place into the nose 2 (two) times daily as needed (nose bleeds).   DOCUSATE SODIUM (COLACE) 100 MG CAPSULE    Take 200 mg by mouth at bedtime.    DORZOLAMIDE-TIMOLOL (COSOPT) 22.3-6.8 MG/ML OPHTHALMIC SOLUTION    Place 1 drop into the left eye every 12 (twelve) hours.   DULOXETINE (CYMBALTA) 30 MG CAPSULE    Take 30 mg by mouth 2 (two) times daily.   FENTANYL (DURAGESIC - DOSED MCG/HR) 100 MCG/HR    Apply one patch topically every 3 days for pain. Remove old patch before applying new patch.   FUROSEMIDE (LASIX) 40 MG TABLET    Take 40 mg by mouth 2 (two) times daily.    GLUCAGON (GLUCAGEN) 1 MG SOLR INJECTION    Inject 1 mg into the vein once as needed for low blood sugar.   GLUCOSAMINE-CHONDROITIN 500-400 MG TABLET    Take 1 tablet by mouth 3 (three) times daily.   GUAIFENESIN (ROBITUSSIN) 100 MG/5ML SYRUP    Take 300 mg by mouth 3 (three) times daily as needed for cough.   HYDROXYZINE (ATARAX/VISTARIL) 25 MG TABLET    Take 25 mg by mouth at bedtime as needed. For itching   INSULIN ASPART (NOVOLOG) 100 UNIT/ML INJECTION    Inject 5 Units into the skin 3 (three) times daily with meals. 8 units If CBG is greater than 150 at lunch and 12 units if CBG is greater than 150 at breakfast and dinner   INSULIN GLARGINE (LANTUS) 100 UNIT/ML INJECTION    Inject 18 Units into the skin at  bedtime.    ISOSORBIDE MONONITRATE (IMDUR) 60 MG 24 HR TABLET    Take 60 mg by mouth every morning.    LEVOTHYROXINE (SYNTHROID, LEVOTHROID) 75 MCG TABLET    Take 75-112.5 mcg by mouth daily. Take 1 and 1/2 tablets on Saturdays then 1 tablet all other days   LINZESS 145 MCG CAPS CAPSULE    Take 145 mcg by mouth daily. Take 30 mins prior to breakfast on an empty stomach.   LORATADINE (CLARITIN) 10 MG TABLET    Take 10 mg by mouth daily.    LOSARTAN (COZAAR) 25 MG TABLET    Take 25 mg by mouth  daily.   LUBIPROSTONE (AMITIZA) 24 MCG CAPSULE    Take 24 mcg by mouth 2 (two) times daily with a meal.     MEMANTINE HCL ER (NAMENDA XR) 28 MG CP24    Take by mouth.   METHYLCELLULOSE (ARTIFICIAL TEARS) 1 % OPHTHALMIC SOLUTION    Place 1 drop into both eyes 4 (four) times daily as needed (for dryness).   MULTIPLE VITAMINS-MINERALS (CERTAVITE SENIOR/ANTIOXIDANT PO)    Take 1 tablet by mouth daily.   NITROGLYCERIN (NITROSTAT) 0.4 MG SL TABLET    Place 0.4 mg under the tongue every 5 (five) minutes as needed. For chest pain   NUTRITIONAL SUPPLEMENTS (RESOURCE 2.0 PO)    Take 120 mLs by mouth 3 (three) times daily.   OMEPRAZOLE (PRILOSEC) 20 MG CAPSULE    Take 20 mg by mouth daily.    PROBIOTIC PRODUCT (ALIGN) 4 MG CAPS    Take 1 capsule by mouth daily.   PROPRANOLOL (INDERAL) 20 MG TABLET    Take 20 mg by mouth 2 (two) times daily.   SPIRONOLACTONE (ALDACTONE) 25 MG TABLET    Take 25 mg by mouth daily.    VITAMIN D, ERGOCALCIFEROL, (DRISDOL) 50000 UNITS CAPS    Take 50,000 Units by mouth every 30 (thirty) days. On the first of the month  Modified Medications   No medications on file  Discontinued Medications   No medications on file    Physical Exam: Filed Vitals:   08/25/15 1158  BP: 129/84  Pulse: 82  Temp: 97.7 F (36.5 C)  TempSrc: Tympanic  Resp: 16   There is no weight on file to calculate BMI.  Physical Exam  Constitutional: She appears well-developed.  HENT:  Head: Normocephalic and  atraumatic.  Left Ear: External ear normal.  Eyes: Conjunctivae and EOM are normal. Pupils are equal, round, and reactive to light.  Neck: Neck supple. No JVD present. No thyromegaly present.  Cardiovascular: Normal rate and regular rhythm.   Murmur heard. Pulmonary/Chest: Effort normal. She has rales.  Bibasilar rales.   Abdominal: Soft. There is no tenderness. There is no rebound.  Musculoskeletal: She exhibits edema and tenderness.  Back pain is chronic which is well controlled. BLE chronic edema trace only. Left foot external deviated deformity.   Lymphadenopathy:    She has no cervical adenopathy.  Neurological: She is alert. No cranial nerve deficit. Coordination normal.  Skin: Skin is warm and dry. Rash noted.  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 R+L Intergluteal cleft recurrent pressure uinjuries in mirror image  Small open areas lateral left lower leg, anterior right lower leg   Psychiatric: Her mood appears anxious. Her affect is not blunt, not labile and not inappropriate. Her speech is rapid and/or pressured (occasionally ). Her speech is not delayed and not slurred. She is hyperactive. She is not agitated, not aggressive, not slowed, not withdrawn, not actively hallucinating and not combative. Thought content is not paranoid and not delusional. She does not exhibit a depressed mood. She exhibits abnormal recent memory.  MMSE 21/30 08/2013    Labs reviewed: Basic Metabolic Panel:  Recent Labs  10/01/14 04/27/15  NA 136* 137  K 4.9 3.7  BUN 35* 28*  CREATININE 0.9 0.9    Liver Function Tests:  Recent Labs  10/01/14 04/27/15  AST 20 17  ALT 13 14  ALKPHOS 80 101    CBC:  Recent Labs  04/27/15 04/30/15 06/02/15 1455  WBC 6.8 6.4 7.7  NEUTROABS  --   --  5.2  HGB 17.3* 17.5* 16.5*  HCT 51* 51* 48.9*  MCV  --   --  98.2  PLT 220 208 216    Lab Results  Component Value Date   TSH 2.66 04/27/2015   Lab Results  Component Value Date   HGBA1C 8.7*  07/24/2015   Lab Results  Component Value Date   CHOL  10/22/2010    100        ATP III CLASSIFICATION:  <200     mg/dL   Desirable  200-239  mg/dL   Borderline High  >=240    mg/dL   High          HDL 55 10/22/2010   LDLCALC  10/22/2010    29        Total Cholesterol/HDL:CHD Risk Coronary Heart Disease Risk Table                     Men   Women  1/2 Average Risk   3.4   3.3  Average Risk       5.0   4.4  2 X Average Risk   9.6   7.1  3 X Average Risk  23.4   11.0        Use the calculated Patient Ratio above and the CHD Risk Table to determine the patient's CHD Risk.        ATP III CLASSIFICATION (LDL):  <100     mg/dL   Optimal  100-129  mg/dL   Near or Above                    Optimal  130-159  mg/dL   Borderline  160-189  mg/dL   High  >190     mg/dL   Very High   TRIG 80 10/22/2010   CHOLHDL 1.8 10/22/2010    Significant Diagnostic Results since last visit: none  Patient Care Team: Reynold Bowen, MD as PCP - General (Endocrinology) Darlin Coco, MD (Cardiology) Arloa Koh, MD (Radiation Oncology) Neldon Mc, MD (General Surgery)  Assessment/Plan Problem List Items Addressed This Visit    Type 2 diabetes mellitus with diabetic neuropathy affecting both sides of body ([place]) - Primary (Chronic)    08/25/15 Lantus 18u qd sq, Novolog 8u with meals for CBG >100 for better CBG control, last Hgb A1c 8.7 07/24/15, CBG am 100s, noon and pm 200s, not consistently receive 5 u Novolog in am, not adequate to cover lunch and dinner mostly. Decrease Lantus to 18u qd sq, increase Novolog to 8u with meals. Monitor CBGs.       Hypothyroidism (Chronic)    Continue Levothyroxine 112.47mcg Sat, 18mcg M, T, W, Thr, F, and Sun.  04/27/15 TSH 2.663. Repeat TSH      HTN (hypertension) (Chronic)    Controlled, continue Losartan 25mg  daily, Atenolol 20mg  bid, Furosemide 40mg  bid, Spironolactone 25mg , Imdur 60mg       GERD (gastroesophageal reflux disease)    Asymptomatic,  change from Omeprazole 20mg  to Pepcis 20mg  daily per Pharm recommendation.       Essential tremor    Mild head titubation and hands tremor-not disabling, continue Propranolol 10mg  bid      Erythrocytosis    Update CBC      Edema (Chronic)    Chronic venous insufficiency. Continue Furosemide 40mg  bid, Spironolctone25mg  daily. Trace edema BLE. Update CMP      Depression    Stable since UTI fully treated, continue Cymbalta 30mg  since 06/29/15.  Dementia with behavioral disturbance (Chronic)    Gradual decline, worsens at onset of UTI, usually returns her baseline mentation after UTI fully treated. Continue Namenda 28mg  daily to preserve memory      Constipation    Stable, continue colace 100 II qhs, Linzess 147mcg, MiraLax prn      Back pain    Chronic issue, pain is controlled, continue Fentanyl 140mcg/hr      A-fib (HCC) (Chronic)    02/03/14 doing well, EKG A-fib with controlled VR.  Heart rate is in control, continue Atenolol.           Family/ staff Communication: continue SNF for care needs,  Labs/tests ordered:  TSH, CBC, CMP  ManXie Jesus Poplin NP [facility] [address], [place] [address] On Call:  [phone] & follow prompts after 5pm & weekends Office Phone:  [phone] Office Fax:  [phone]

## 2015-08-25 NOTE — Assessment & Plan Note (Signed)
02/03/14 doing well, EKG A-fib with controlled VR.  Heart rate is in control, continue Atenolol.

## 2015-08-25 NOTE — Assessment & Plan Note (Signed)
08/25/15 Lantus 18u qd sq, Novolog 8u with meals for CBG >100 for better CBG control, last Hgb A1c 8.7 07/24/15, CBG am 100s, noon and pm 200s, not consistently receive 5 u Novolog in am, not adequate to cover lunch and dinner mostly. Decrease Lantus to 18u qd sq, increase Novolog to 8u with meals. Monitor CBGs.

## 2015-08-25 NOTE — Assessment & Plan Note (Signed)
Mild head titubation and hands tremor-not disabling, continue Propranolol 10mg  bid

## 2015-08-25 NOTE — Assessment & Plan Note (Signed)
Asymptomatic, change from Omeprazole 20mg  to Pepcis 20mg  daily per Pharm recommendation.

## 2015-08-25 NOTE — Assessment & Plan Note (Signed)
Gradual decline, worsens at onset of UTI, usually returns her baseline mentation after UTI fully treated. Continue Namenda 28mg  daily to preserve memory

## 2015-08-25 NOTE — Assessment & Plan Note (Signed)
Chronic issue, pain is controlled, continue Fentanyl 140mcg/hr

## 2015-08-25 NOTE — Assessment & Plan Note (Addendum)
Chronic venous insufficiency. Continue Furosemide 40mg  bid, Spironolctone25mg  daily. Trace edema BLE. Update CMP

## 2015-08-25 NOTE — Assessment & Plan Note (Addendum)
Continue Levothyroxine 112.81mcg Sat, 43mcg M, T, W, Thr, F, and Sun.  04/27/15 TSH 2.663. Repeat TSH

## 2015-08-25 NOTE — Assessment & Plan Note (Signed)
Controlled, continue Losartan 25mg  daily, Atenolol 20mg  bid, Furosemide 40mg  bid, Spironolactone 25mg , Imdur 60mg 

## 2015-08-25 NOTE — Assessment & Plan Note (Signed)
Stable, continue colace 100 II qhs, Linzess 164mcg, MiraLax prn

## 2015-08-25 NOTE — Assessment & Plan Note (Signed)
Update CBC. 

## 2015-08-27 LAB — BASIC METABOLIC PANEL
BUN: 36 mg/dL — AB (ref 4–21)
Creatinine: 1.2 mg/dL — AB (ref 0.5–1.1)
Glucose: 252 mg/dL
Potassium: 4.1 mmol/L (ref 3.4–5.3)
Sodium: 137 mmol/L (ref 137–147)

## 2015-08-27 LAB — HEPATIC FUNCTION PANEL
ALK PHOS: 120 U/L (ref 25–125)
ALT: 100 U/L — AB (ref 7–35)
AST: 88 U/L — AB (ref 13–35)
BILIRUBIN, TOTAL: 0.7 mg/dL

## 2015-08-27 LAB — CBC AND DIFFERENTIAL
HEMATOCRIT: 50 % — AB (ref 36–46)
Hemoglobin: 17.8 g/dL — AB (ref 12.0–16.0)
PLATELETS: 218 10*3/uL (ref 150–399)
WBC: 9.3 10*3/mL

## 2015-08-27 LAB — TSH: TSH: 2.37 u[IU]/mL (ref 0.41–5.90)

## 2015-08-28 ENCOUNTER — Other Ambulatory Visit: Payer: Self-pay | Admitting: Nurse Practitioner

## 2015-08-28 DIAGNOSIS — E039 Hypothyroidism, unspecified: Secondary | ICD-10-CM

## 2015-08-28 DIAGNOSIS — D751 Secondary polycythemia: Secondary | ICD-10-CM

## 2015-09-04 ENCOUNTER — Non-Acute Institutional Stay (SKILLED_NURSING_FACILITY): Payer: Medicare Other | Admitting: Nurse Practitioner

## 2015-09-04 ENCOUNTER — Encounter: Payer: Self-pay | Admitting: Nurse Practitioner

## 2015-09-04 DIAGNOSIS — D751 Secondary polycythemia: Secondary | ICD-10-CM | POA: Diagnosis not present

## 2015-09-04 DIAGNOSIS — F329 Major depressive disorder, single episode, unspecified: Secondary | ICD-10-CM | POA: Diagnosis not present

## 2015-09-04 DIAGNOSIS — I10 Essential (primary) hypertension: Secondary | ICD-10-CM | POA: Diagnosis not present

## 2015-09-04 DIAGNOSIS — R609 Edema, unspecified: Secondary | ICD-10-CM

## 2015-09-04 DIAGNOSIS — E039 Hypothyroidism, unspecified: Secondary | ICD-10-CM | POA: Diagnosis not present

## 2015-09-04 DIAGNOSIS — E1142 Type 2 diabetes mellitus with diabetic polyneuropathy: Secondary | ICD-10-CM

## 2015-09-04 DIAGNOSIS — R748 Abnormal levels of other serum enzymes: Secondary | ICD-10-CM

## 2015-09-04 DIAGNOSIS — F0391 Unspecified dementia with behavioral disturbance: Secondary | ICD-10-CM | POA: Diagnosis not present

## 2015-09-04 DIAGNOSIS — F03918 Unspecified dementia, unspecified severity, with other behavioral disturbance: Secondary | ICD-10-CM

## 2015-09-04 DIAGNOSIS — K219 Gastro-esophageal reflux disease without esophagitis: Secondary | ICD-10-CM

## 2015-09-04 DIAGNOSIS — I4821 Permanent atrial fibrillation: Secondary | ICD-10-CM

## 2015-09-04 DIAGNOSIS — F32A Depression, unspecified: Secondary | ICD-10-CM

## 2015-09-04 DIAGNOSIS — M5440 Lumbago with sciatica, unspecified side: Secondary | ICD-10-CM

## 2015-09-04 DIAGNOSIS — I482 Chronic atrial fibrillation: Secondary | ICD-10-CM

## 2015-09-04 DIAGNOSIS — G250 Essential tremor: Secondary | ICD-10-CM

## 2015-09-04 DIAGNOSIS — L8992 Pressure ulcer of unspecified site, stage 2: Secondary | ICD-10-CM | POA: Diagnosis not present

## 2015-09-04 NOTE — Assessment & Plan Note (Signed)
Gradual decline. Continue Namenda 28mg  daily to preserve memory

## 2015-09-04 NOTE — Assessment & Plan Note (Signed)
6 /16/16 RBC 5.36, Hgb 17.5, Htc 31.0-Hematology when the patient desires. 08/27/15 RBC 5.27, Hgb 17.8, Htc 50.3

## 2015-09-04 NOTE — Progress Notes (Signed)
Patient ID: [name], female   DOB: [birth year], 81 y.o.   MRN: [id number]  Location:  SNF [facility] Provider:  Marlana Latus NP  Code Status:  DNR Goals of care: Advanced Directive information    Chief Complaint  Patient presents with  . Medical Management of Chronic Issues  . Acute Visit    pressure ulcers R+L buttocks.      HPI: Patient is a 81 y.o. female seen in the SNF at [facility] today for evaluation of pressure ulcers at the R+L intergluteal cleft, stage II, healthy wound beds, small amount of bleeding, no s/s of infection.   Review of Systems:  Review of Systems  Constitutional: Negative for fever, chills and diaphoresis.       C/o aches allover  HENT: Positive for hearing loss. Negative for congestion, ear discharge and ear pain.   Eyes: Negative for pain, discharge and redness.  Respiratory: Negative for cough, shortness of breath and wheezing.   Cardiovascular: Positive for leg swelling. Negative for chest pain and palpitations.  Gastrointestinal: Negative for nausea, vomiting, abdominal pain and constipation.  Genitourinary: Positive for frequency. Negative for dysuria and urgency.       Recurrent urinary tract infections  Musculoskeletal: Positive for back pain. Negative for myalgias and neck pain.  Skin: Negative for rash.       Long standing excoriated buttocks with pressure ulcers L+R buttocks, pressure ulcers at the R+L intergluteal cleft, stage II, healthy wound beds, small amount of bleeding, no s/s of infection.    Neurological: Negative for dizziness, tremors, seizures, weakness and headaches.  Endo/Heme/Allergies: Negative for polydipsia.  Psychiatric/Behavioral: Positive for memory loss. Negative for suicidal ideas and hallucinations. The patient is nervous/anxious.        More confused    Past Medical History  Diagnosis Date  . Diabetes mellitus   . Hypertension   . Heart murmur   . Benign familial tremor   . Rosacea   . Diverticulitis   . Heart  failure     diastolic heart failure  . TIA (transient ischemic attack) 2010  . Arthritis   . Breast cancer ([place])     breast -rt  . CAD (coronary artery disease)     single vessel CAD per cath in 2006; scattered nonobstructive disease in the left system, left to right collaterals to the RCA which was occluded by flush shots; she has been managed medically  . Polycythemia 06/05/2015    Normal erythropoietin; O2 sat 95% room air     Patient Active Problem List   Diagnosis Date Noted  . Liver enzyme elevation 09/04/2015  . GERD (gastroesophageal reflux disease) 08/25/2015  . Blisters of multiple sites 08/07/2015  . Traumatic hematoma of left knee 08/07/2015  . Polycythemia 06/05/2015  . Erythrocytosis 05/01/2015  . Controlled type 2 DM with peripheral circulatory disorder ([place]) 11/04/2014  . Herpes zoster 09/30/2014  . Right-sided chest wall pain 09/30/2014  . Plantar fasciitis of left foot 07/18/2014  . Fecal incontinence 06/10/2014  . Left leg pain 04/18/2014  . Other malaise and fatigue 04/18/2014  . UTI (urinary tract infection) 02/09/2014  . Venous stasis dermatitis of both lower extremities 12/10/2013  . Candidiasis of skin 09/10/2013  . Dementia with behavioral disturbance 08/23/2013  . Right hip pain 08/13/2013  . PAF (paroxysmal atrial fibrillation) ([place]) 07/26/2013  . A-fib ([place]) 07/23/2013  . Edema 06/27/2013  . Ulcer of foot due to diabetes mellitus ([place]) 06/27/2013  . Pressure ulcer stage II 06/18/2013  .  Falls frequently 04/23/2013  . DNR (do not resuscitate) 04/23/2013  . Type 2 diabetes mellitus with diabetic neuropathy affecting both sides of body ([place]) 02/26/2013  . Hypothyroidism 02/26/2013  . Depression 02/26/2013  . Essential tremor 02/26/2013  . Back pain 02/26/2013  . HTN (hypertension) 02/26/2013  . Constipation 02/26/2013  . Ischemic heart disease 10/02/2012  . Leg swelling 10/02/2012  . Dyspepsia 10/02/2012  . Diabetic foot ulcer ([place]) 07/15/2011    . 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 Penicillins Other (See Comments)    Per MAR  . Bee Venom Other (See Comments)    Per MAR  . Celebrex [Celecoxib] Other (See Comments)    Per MAR  . Lidocaine Hcl Other (See Comments)    Per MAR  . Phenobarbital Other (See Comments)    Per MAR  . Procaine Hcl Other (See Comments)    Per mar    Medications: Patient's Medications  New Prescriptions   No medications on file  Previous Medications   ACETAMINOPHEN (TYLENOL) 325 MG TABLET    Take 2 tablets (650 mg total) by mouth every 4 (four) hours as needed.   ALUM & MAG HYDROXIDE-SIMETH (MAALOX/MYLANTA) 200-200-20 MG/5ML SUSPENSION    Take 30 mLs by mouth every 6 (six) hours as needed for indigestion or heartburn.   ASPIRIN EC 81 MG TABLET    Take 81 mg by mouth daily.   AYR SALINE NASAL NO-DRIP GEL    Place into the nose 2 (two) times daily as needed (nose bleeds).   DOCUSATE SODIUM (COLACE) 100 MG CAPSULE    Take 200 mg by mouth at bedtime.    DORZOLAMIDE-TIMOLOL (COSOPT) 22.3-6.8 MG/ML OPHTHALMIC SOLUTION    Place 1 drop into the left eye every 12 (twelve) hours.   DULOXETINE (CYMBALTA) 30 MG CAPSULE    Take 30 mg by mouth 2 (two) times daily.   FENTANYL (DURAGESIC - DOSED MCG/HR) 100 MCG/HR    Apply one patch topically every 3 days for pain. Remove old patch before applying new patch.   FUROSEMIDE (LASIX) 40 MG TABLET    Take 40 mg by mouth 2 (two) times daily.    GLUCAGON (GLUCAGEN) 1 MG SOLR INJECTION    Inject 1 mg into the vein once as needed for low blood sugar.   GLUCOSAMINE-CHONDROITIN 500-400 MG TABLET    Take 1 tablet by mouth 3 (three) times daily.   GUAIFENESIN (ROBITUSSIN) 100 MG/5ML SYRUP    Take 300 mg by mouth 3 (three) times daily as needed for cough.   HYDROXYZINE (ATARAX/VISTARIL) 25 MG TABLET    Take 25 mg by mouth at bedtime as needed. For itching   INSULIN ASPART (NOVOLOG) 100 UNIT/ML INJECTION    Inject 5 Units into  the skin 3 (three) times daily with meals. 8 units If CBG is greater than 150 at lunch and 12 units if CBG is greater than 150 at breakfast and dinner   INSULIN GLARGINE (LANTUS) 100 UNIT/ML INJECTION    Inject 18 Units into the skin at bedtime.    ISOSORBIDE MONONITRATE (IMDUR) 60 MG 24 HR TABLET    Take 60 mg by mouth every morning.    LEVOTHYROXINE (SYNTHROID, LEVOTHROID) 75 MCG TABLET    Take 75-112.5 mcg by mouth daily. Take 1 and 1/2 tablets on Saturdays then 1 tablet all other days   LINZESS 145 MCG CAPS CAPSULE    Take 145 mcg by mouth daily.  Take 30 mins prior to breakfast on an empty stomach.   LORATADINE (CLARITIN) 10 MG TABLET    Take 10 mg by mouth daily.    LOSARTAN (COZAAR) 25 MG TABLET    Take 25 mg by mouth daily.   LUBIPROSTONE (AMITIZA) 24 MCG CAPSULE    Take 24 mcg by mouth 2 (two) times daily with a meal.     MEMANTINE HCL ER (NAMENDA XR) 28 MG CP24    Take by mouth.   METHYLCELLULOSE (ARTIFICIAL TEARS) 1 % OPHTHALMIC SOLUTION    Place 1 drop into both eyes 4 (four) times daily as needed (for dryness).   MULTIPLE VITAMINS-MINERALS (CERTAVITE SENIOR/ANTIOXIDANT PO)    Take 1 tablet by mouth daily.   NITROGLYCERIN (NITROSTAT) 0.4 MG SL TABLET    Place 0.4 mg under the tongue every 5 (five) minutes as needed. For chest pain   NUTRITIONAL SUPPLEMENTS (RESOURCE 2.0 PO)    Take 120 mLs by mouth 3 (three) times daily.   OMEPRAZOLE (PRILOSEC) 20 MG CAPSULE    Take 20 mg by mouth daily.    PROBIOTIC PRODUCT (ALIGN) 4 MG CAPS    Take 1 capsule by mouth daily.   PROPRANOLOL (INDERAL) 20 MG TABLET    Take 20 mg by mouth 2 (two) times daily.   SPIRONOLACTONE (ALDACTONE) 25 MG TABLET    Take 25 mg by mouth daily.    VITAMIN D, ERGOCALCIFEROL, (DRISDOL) 50000 UNITS CAPS    Take 50,000 Units by mouth every 30 (thirty) days. On the first of the month  Modified Medications   No medications on file  Discontinued Medications   No medications on file    Physical Exam: Filed Vitals:    09/04/15 1254  BP: 128/76  Pulse: 64  Temp: 98.2 F (36.8 C)  TempSrc: Tympanic  Resp: 18   There is no weight on file to calculate BMI.  Physical Exam  Constitutional: She appears well-developed.  HENT:  Head: Normocephalic and atraumatic.  Left Ear: External ear normal.  Eyes: Conjunctivae and EOM are normal. Pupils are equal, round, and reactive to light.  Neck: Neck supple. No JVD present. No thyromegaly present.  Cardiovascular: Normal rate and regular rhythm.   Murmur heard. Pulmonary/Chest: Effort normal. She has rales.  Bibasilar rales.   Abdominal: Soft. There is no tenderness. There is no rebound.  Musculoskeletal: She exhibits edema and tenderness.  Back pain is chronic which is well controlled. BLE chronic edema trace only. Left foot external deviated deformity.   Lymphadenopathy:    She has no cervical adenopathy.  Neurological: She is alert. No cranial nerve deficit. Coordination normal.  Skin: Skin is warm and dry. Rash noted.  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 R+L   Small open areas lateral left lower leg, anterior right lower leg. Pressure ulcers at the R+L intergluteal cleft, stage II, healthy wound beds, small amount of bleeding, no s/s of infection.    Psychiatric: Her mood appears anxious. Her affect is not blunt, not labile and not inappropriate. Her speech is rapid and/or pressured (occasionally ). Her speech is not delayed and not slurred. She is hyperactive. She is not agitated, not aggressive, not slowed, not withdrawn, not actively hallucinating and not combative. Thought content is not paranoid and not delusional. She does not exhibit a depressed mood. She exhibits abnormal recent memory.  MMSE 21/30 08/2013    Labs reviewed: Basic Metabolic Panel:  Recent Labs  10/01/14 04/27/15 08/27/15  NA 136* 137 137  K  4.9 3.7 4.1  BUN 35* 28* 36*  CREATININE 0.9 0.9 1.2*    Liver Function Tests:  Recent Labs  10/01/14 04/27/15 08/27/15  AST 20 17 88*   ALT 13 14 100*  ALKPHOS 80 101 120    CBC:  Recent Labs  04/30/15 06/02/15 1455 08/27/15  WBC 6.4 7.7 9.3  NEUTROABS  --  5.2  --   HGB 17.5* 16.5* 17.8*  HCT 51* 48.9* 50*  MCV  --  98.2  --   PLT 208 216 218    Lab Results  Component Value Date   TSH 2.37 08/27/2015   Lab Results  Component Value Date   HGBA1C 8.7* 07/24/2015   Lab Results  Component Value Date   CHOL  10/22/2010    100        ATP III CLASSIFICATION:  <200     mg/dL   Desirable  200-239  mg/dL   Borderline High  >=240    mg/dL   High          HDL 55 10/22/2010   LDLCALC  10/22/2010    29        Total Cholesterol/HDL:CHD Risk Coronary Heart Disease Risk Table                     Men   Women  1/2 Average Risk   3.4   3.3  Average Risk       5.0   4.4  2 X Average Risk   9.6   7.1  3 X Average Risk  23.4   11.0        Use the calculated Patient Ratio above and the CHD Risk Table to determine the patient's CHD Risk.        ATP III CLASSIFICATION (LDL):  <100     mg/dL   Optimal  100-129  mg/dL   Near or Above                    Optimal  130-159  mg/dL   Borderline  160-189  mg/dL   High  >190     mg/dL   Very High   TRIG 80 10/22/2010   CHOLHDL 1.8 10/22/2010    Significant Diagnostic Results since last visit: none  Patient Care Team: Reynold Bowen, MD as PCP - General (Endocrinology) Darlin Coco, MD (Cardiology) Arloa Koh, MD (Radiation Oncology) Neldon Mc, MD (General Surgery)  Assessment/Plan Problem List Items Addressed This Visit    A-fib [facility]) (Chronic)    02/03/14 doing well, EKG A-fib with controlled VR.  Heart rate is in control, continue Atenolol.       Back pain    Chronic issue, pain is controlled, continue Fentanyl 113mcg/hr      Dementia with behavioral disturbance (Chronic)    Gradual decline. Continue Namenda 28mg  daily to preserve memory       Depression    Stable, continue Cymbalta 30mg  since 06/29/15.      Edema (Chronic)     Chronic venous insufficiency. Continue Furosemide 40mg  bid, Spironolctone25mg  daily. Trace edema BLE      Erythrocytosis    6 /16/16 RBC 5.36, Hgb 17.5, Htc 31.0-Hematology when the patient desires. 08/27/15 RBC 5.27, Hgb 17.8, Htc 50.3      Essential tremor    Mild head titubation and hands tremor-not disabling, continue Propranolol 10mg  bid      GERD (gastroesophageal reflux disease)    Asymptomatic, continue Pepcid 20mg   HTN (hypertension) (Chronic)    Controlled, continue Losartan 25mg  daily, Atenolol 20mg  bid, Furosemide 40mg  bid, Spironolactone 25mg , Imdur 60mg       Hypothyroidism (Chronic)    Continue Levothyroxine 112.67mcg Sat, 67mcg M, T, W, Thr, F, and Sun, last TSH 08/27/15 2.365      Liver enzyme elevation - Primary    08/27/15 AST 88, ALT 122 09/03/15 AST 36, ALT 45 Better, continue to observe.       Pressure ulcer stage II    Several small open areas at the R+L intergluteal cleft, healthy wound beds, no s/s of infection, small amount of bleeding noted, pressure reduction is must, but the patient is sitting and sleeping in her reclining all the time, will apply heavy and generous amount of barrier ointment 4x/day, encourage frequent position change to reduce pressure.        Type 2 diabetes mellitus with diabetic neuropathy affecting both sides of body ([facility]CC) (Chronic)    Continue Lantus 18u qd sq, Novolog 8u with meals for CBG >100 for better CBG control, last Hgb A1c 8.7 07/24/15           Family/ staff Communication: continue SNF for care needs,  Labs/tests ordered:  none  [facility] Dezarai Prew NP Geriatrics [facility] [address], [place] [address] On Call:  [phone] & follow prompts after 5pm & weekends Office Phone:  [phone] Office Fax:  [phone]

## 2015-09-04 NOTE — Assessment & Plan Note (Signed)
02/03/14 doing well, EKG A-fib with controlled VR.  Heart rate is in control, continue Atenolol.

## 2015-09-04 NOTE — Assessment & Plan Note (Signed)
Stable, continue Cymbalta 30mg  since 06/29/15.

## 2015-09-04 NOTE — Assessment & Plan Note (Signed)
Several small open areas at the R+L intergluteal cleft, healthy wound beds, no s/s of infection, small amount of bleeding noted, pressure reduction is must, but the patient is sitting and sleeping in her reclining all the time, will apply heavy and generous amount of barrier ointment 4x/day, encourage frequent position change to reduce pressure.

## 2015-09-04 NOTE — Assessment & Plan Note (Signed)
Controlled, continue Losartan 25mg  daily, Atenolol 20mg  bid, Furosemide 40mg  bid, Spironolactone 25mg , Imdur 60mg 

## 2015-09-04 NOTE — Assessment & Plan Note (Signed)
Chronic issue, pain is controlled, continue Fentanyl 152mcg/hr

## 2015-09-04 NOTE — Assessment & Plan Note (Signed)
Continue Lantus 18u qd sq, Novolog 8u with meals for CBG >100 for better CBG control, last Hgb A1c 8.7 07/24/15

## 2015-09-04 NOTE — Assessment & Plan Note (Signed)
Continue Levothyroxine 112.26mcg Sat, 13mcg M, T, W, Thr, F, and Sun, last TSH 08/27/15 2.365

## 2015-09-04 NOTE — Assessment & Plan Note (Signed)
Asymptomatic, continue Pepcid 20mg 

## 2015-09-04 NOTE — Assessment & Plan Note (Signed)
Chronic venous insufficiency. Continue Furosemide 40mg  bid, Spironolctone25mg  daily. Trace edema BLE

## 2015-09-04 NOTE — Assessment & Plan Note (Signed)
Mild head titubation and hands tremor-not disabling, continue Propranolol 10mg  bid

## 2015-09-04 NOTE — Assessment & Plan Note (Signed)
08/27/15 AST 88, ALT 122 09/03/15 AST 36, ALT 45 Better, continue to observe.

## 2015-09-25 ENCOUNTER — Non-Acute Institutional Stay (SKILLED_NURSING_FACILITY): Payer: Medicare Other | Admitting: Nurse Practitioner

## 2015-09-25 ENCOUNTER — Encounter: Payer: Self-pay | Admitting: Nurse Practitioner

## 2015-09-25 DIAGNOSIS — G250 Essential tremor: Secondary | ICD-10-CM

## 2015-09-25 DIAGNOSIS — K5900 Constipation, unspecified: Secondary | ICD-10-CM

## 2015-09-25 DIAGNOSIS — E1151 Type 2 diabetes mellitus with diabetic peripheral angiopathy without gangrene: Secondary | ICD-10-CM

## 2015-09-25 DIAGNOSIS — I4821 Permanent atrial fibrillation: Secondary | ICD-10-CM

## 2015-09-25 DIAGNOSIS — F32A Depression, unspecified: Secondary | ICD-10-CM

## 2015-09-25 DIAGNOSIS — R609 Edema, unspecified: Secondary | ICD-10-CM | POA: Diagnosis not present

## 2015-09-25 DIAGNOSIS — N390 Urinary tract infection, site not specified: Secondary | ICD-10-CM | POA: Diagnosis not present

## 2015-09-25 DIAGNOSIS — I10 Essential (primary) hypertension: Secondary | ICD-10-CM | POA: Diagnosis not present

## 2015-09-25 DIAGNOSIS — K219 Gastro-esophageal reflux disease without esophagitis: Secondary | ICD-10-CM

## 2015-09-25 DIAGNOSIS — F329 Major depressive disorder, single episode, unspecified: Secondary | ICD-10-CM

## 2015-09-25 DIAGNOSIS — I482 Chronic atrial fibrillation: Secondary | ICD-10-CM | POA: Diagnosis not present

## 2015-09-25 DIAGNOSIS — M5440 Lumbago with sciatica, unspecified side: Secondary | ICD-10-CM | POA: Diagnosis not present

## 2015-09-25 DIAGNOSIS — F03918 Unspecified dementia, unspecified severity, with other behavioral disturbance: Secondary | ICD-10-CM

## 2015-09-25 DIAGNOSIS — F0391 Unspecified dementia with behavioral disturbance: Secondary | ICD-10-CM | POA: Diagnosis not present

## 2015-09-25 NOTE — Assessment & Plan Note (Signed)
Chronic issue, pain is controlled, continue Fentanyl 100mcg/hr 

## 2015-09-25 NOTE — Assessment & Plan Note (Signed)
Stable, continue Cymbalta 30mg since 06/29/15. 

## 2015-09-25 NOTE — Assessment & Plan Note (Signed)
04/22/14 Urine culture E Coli 7 day course of Cipro 500mg  bid.  10/01/14 Urine culture pos nitrate, large leukocyte esterase, many bacteria, wbc 21-50. Empirical ABT Nitrofurantoinin 100mg  bid x 7 days started 11/181/5.  05/17/15 urine culture E Coli, 05/18/15 7 day course Cipro 250mg  bid 07/30/15 urine culture E. Coli Nitrofurantoin 100mg  bid x 7 days 09/20/15 urine culture, K. [place] 7 day course Cipro 500mg  bid

## 2015-09-25 NOTE — Assessment & Plan Note (Signed)
Chronic venous insufficiency. Continue Furosemide 40mg bid, Spironolctone25mg daily. Trace edema BLE.   

## 2015-09-25 NOTE — Assessment & Plan Note (Signed)
Continue Lantus 18u qd sq, Novolog 8u with meals for CBG >100 for better CBG control, last Hgb A1c 8.7 07/24/15  

## 2015-09-25 NOTE — Assessment & Plan Note (Signed)
02/03/14 doing well, EKG A-fib with controlled VR.  Heart rate is in control, continue Atenolol.  

## 2015-09-25 NOTE — Assessment & Plan Note (Signed)
Mild head titubation and hands tremor-not disabling, continue Propranolol 10mg bid 

## 2015-09-25 NOTE — Assessment & Plan Note (Signed)
Asymptomatic, continue Pepcid 20mg  

## 2015-09-25 NOTE — Assessment & Plan Note (Signed)
Stable, continue colace 100 II qhs, Linzess 145mcg, MiraLax prn. 

## 2015-09-25 NOTE — Progress Notes (Signed)
Patient ID: [name], female   DOB: Mar 11, 1926, 81 y.o.   MRN: [id number]  Location:  SNF [facility] Provider:  Marlana Latus NP  Code Status:  DNR Goals of care: Advanced Directive information    Chief Complaint  Patient presents with  . Medical Management of Chronic Issues  . Acute Visit    UTI     HPI: Patient is a 81 y.o. female seen in the SNF at [facility] today for evaluation of UTI, urine culture 09/20/15 K. Pneumoniae, 7 day course Cipro 500mg  bid, tolerated, improved dysuria and urinary frequency, blood sugar runs150-200,  pressure ulcers at the R+L intergluteal cleft, stage II, slow healing.   Review of Systems  Constitutional: Negative for fever, chills and diaphoresis.       C/o aches allover  HENT: Positive for hearing loss. Negative for congestion, ear discharge and ear pain.   Eyes: Negative for pain, discharge and redness.  Respiratory: Negative for cough, shortness of breath and wheezing.   Cardiovascular: Positive for leg swelling. Negative for chest pain and palpitations.  Gastrointestinal: Negative for nausea, vomiting, abdominal pain and constipation.  Genitourinary: Positive for frequency. Negative for dysuria and urgency.       Recurrent urinary tract infections  Musculoskeletal: Positive for back pain. Negative for myalgias and neck pain.  Skin: Negative for rash.       Long standing excoriated buttocks with pressure ulcers L+R buttocks, pressure ulcers at the R+L intergluteal cleft, stage II, healthy wound beds, small amount of bleeding, no s/s of infection.    Neurological: Negative for dizziness, tremors, seizures, weakness and headaches.  Endo/Heme/Allergies: Negative for polydipsia.  Psychiatric/Behavioral: Positive for memory loss. Negative for suicidal ideas and hallucinations. The patient is nervous/anxious.        More confused    Past Medical History  Diagnosis Date  . Diabetes mellitus   . Hypertension   . Heart murmur   . Benign familial  tremor   . Rosacea   . Diverticulitis   . Heart failure     diastolic heart failure  . TIA (transient ischemic attack) 2010  . Arthritis   . Breast cancer ([place])     breast -rt  . CAD (coronary artery disease)     single vessel CAD per cath in 2006; scattered nonobstructive disease in the left system, left to right collaterals to the RCA which was occluded by flush shots; she has been managed medically  . Polycythemia 06/05/2015    Normal erythropoietin; O2 sat 95% room air     Patient Active Problem List   Diagnosis Date Noted  . Liver enzyme elevation 09/04/2015  . GERD (gastroesophageal reflux disease) 08/25/2015  . Blisters of multiple sites 08/07/2015  . Traumatic hematoma of left knee 08/07/2015  . Polycythemia 06/05/2015  . Erythrocytosis 05/01/2015  . Controlled type 2 DM with peripheral circulatory disorder ([place]) 11/04/2014  . Herpes zoster 09/30/2014  . Right-sided chest wall pain 09/30/2014  . Plantar fasciitis of left foot 07/18/2014  . Fecal incontinence 06/10/2014  . Left leg pain 04/18/2014  . Other malaise and fatigue 04/18/2014  . UTI (urinary tract infection) 02/09/2014  . Venous stasis dermatitis of both lower extremities 12/10/2013  . Candidiasis of skin 09/10/2013  . Dementia with behavioral disturbance 08/23/2013  . Right hip pain 08/13/2013  . PAF (paroxysmal atrial fibrillation) ([place]) 07/26/2013  . A-fib ([place]) 07/23/2013  . Edema 06/27/2013  . Ulcer of foot due to diabetes mellitus ([place]) 06/27/2013  .  Pressure ulcer stage II 06/18/2013  . Falls frequently 04/23/2013  . DNR (do not resuscitate) 04/23/2013  . Type 2 diabetes mellitus with diabetic neuropathy affecting both sides of body ([place]) 02/26/2013  . Hypothyroidism 02/26/2013  . Depression 02/26/2013  . Essential tremor 02/26/2013  . Back pain 02/26/2013  . HTN (hypertension) 02/26/2013  . Constipation 02/26/2013  . Ischemic heart disease 10/02/2012  . Leg swelling 10/02/2012  . Dyspepsia  10/02/2012  . Diabetic foot ulcer ([place]) 07/15/2011  . 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 Penicillins Other (See Comments)    Per MAR  . Bee Venom Other (See Comments)    Per MAR  . Celebrex [Celecoxib] Other (See Comments)    Per MAR  . Lidocaine Hcl Other (See Comments)    Per MAR  . Phenobarbital Other (See Comments)    Per MAR  . Procaine Hcl Other (See Comments)    Per mar    Medications: Patient's Medications  New Prescriptions   No medications on file  Previous Medications   ACETAMINOPHEN (TYLENOL) 325 MG TABLET    Take 2 tablets (650 mg total) by mouth every 4 (four) hours as needed.   ALUM & MAG HYDROXIDE-SIMETH (MAALOX/MYLANTA) 200-200-20 MG/5ML SUSPENSION    Take 30 mLs by mouth every 6 (six) hours as needed for indigestion or heartburn.   ASPIRIN EC 81 MG TABLET    Take 81 mg by mouth daily.   AYR SALINE NASAL NO-DRIP GEL    Place into the nose 2 (two) times daily as needed (nose bleeds).   DOCUSATE SODIUM (COLACE) 100 MG CAPSULE    Take 200 mg by mouth at bedtime.    DORZOLAMIDE-TIMOLOL (COSOPT) 22.3-6.8 MG/ML OPHTHALMIC SOLUTION    Place 1 drop into the left eye every 12 (twelve) hours.   DULOXETINE (CYMBALTA) 30 MG CAPSULE    Take 30 mg by mouth 2 (two) times daily.   FENTANYL (DURAGESIC - DOSED MCG/HR) 100 MCG/HR    Apply one patch topically every 3 days for pain. Remove old patch before applying new patch.   FUROSEMIDE (LASIX) 40 MG TABLET    Take 40 mg by mouth 2 (two) times daily.    GLUCAGON (GLUCAGEN) 1 MG SOLR INJECTION    Inject 1 mg into the vein once as needed for low blood sugar.   GLUCOSAMINE-CHONDROITIN 500-400 MG TABLET    Take 1 tablet by mouth 3 (three) times daily.   GUAIFENESIN (ROBITUSSIN) 100 MG/5ML SYRUP    Take 300 mg by mouth 3 (three) times daily as needed for cough.   HYDROXYZINE (ATARAX/VISTARIL) 25 MG TABLET    Take 25 mg by mouth at bedtime as needed. For itching   INSULIN ASPART  (NOVOLOG) 100 UNIT/ML INJECTION    Inject 5 Units into the skin 3 (three) times daily with meals. 8 units If CBG is greater than 150 at lunch and 12 units if CBG is greater than 150 at breakfast and dinner   INSULIN GLARGINE (LANTUS) 100 UNIT/ML INJECTION    Inject 18 Units into the skin at bedtime.    ISOSORBIDE MONONITRATE (IMDUR) 60 MG 24 HR TABLET    Take 60 mg by mouth every morning.    LEVOTHYROXINE (SYNTHROID, LEVOTHROID) 75 MCG TABLET    Take 75-112.5 mcg by mouth daily. Take 1 and 1/2 tablets on Saturdays then 1 tablet all other days   LINZESS 145 MCG CAPS CAPSULE  Take 145 mcg by mouth daily. Take 30 mins prior to breakfast on an empty stomach.   LORATADINE (CLARITIN) 10 MG TABLET    Take 10 mg by mouth daily.    LOSARTAN (COZAAR) 25 MG TABLET    Take 25 mg by mouth daily.   LUBIPROSTONE (AMITIZA) 24 MCG CAPSULE    Take 24 mcg by mouth 2 (two) times daily with a meal.     MEMANTINE HCL ER (NAMENDA XR) 28 MG CP24    Take by mouth.   METHYLCELLULOSE (ARTIFICIAL TEARS) 1 % OPHTHALMIC SOLUTION    Place 1 drop into both eyes 4 (four) times daily as needed (for dryness).   MULTIPLE VITAMINS-MINERALS (CERTAVITE SENIOR/ANTIOXIDANT PO)    Take 1 tablet by mouth daily.   NITROGLYCERIN (NITROSTAT) 0.4 MG SL TABLET    Place 0.4 mg under the tongue every 5 (five) minutes as needed. For chest pain   NUTRITIONAL SUPPLEMENTS (RESOURCE 2.0 PO)    Take 120 mLs by mouth 3 (three) times daily.   OMEPRAZOLE (PRILOSEC) 20 MG CAPSULE    Take 20 mg by mouth daily.    PROBIOTIC PRODUCT (ALIGN) 4 MG CAPS    Take 1 capsule by mouth daily.   PROPRANOLOL (INDERAL) 20 MG TABLET    Take 20 mg by mouth 2 (two) times daily.   SPIRONOLACTONE (ALDACTONE) 25 MG TABLET    Take 25 mg by mouth daily.    VITAMIN D, ERGOCALCIFEROL, (DRISDOL) 50000 UNITS CAPS    Take 50,000 Units by mouth every 30 (thirty) days. On the first of the month  Modified Medications   No medications on file  Discontinued Medications   No  medications on file    Physical Exam: Filed Vitals:   09/25/15 1121  BP: 105/64  Pulse: 82  Temp: 97.1 F (36.2 C)  TempSrc: Tympanic  Resp: 20   There is no weight on file to calculate BMI.  Physical Exam  Constitutional: She appears well-developed.  HENT:  Head: Normocephalic and atraumatic.  Left Ear: External ear normal.  Eyes: Conjunctivae and EOM are normal. Pupils are equal, round, and reactive to light.  Neck: Neck supple. No JVD present. No thyromegaly present.  Cardiovascular: Normal rate and regular rhythm.   Murmur heard. Pulmonary/Chest: Effort normal. She has rales.  Bibasilar rales.   Abdominal: Soft. There is no tenderness. There is no rebound.  Musculoskeletal: She exhibits edema and tenderness.  Back pain is chronic which is well controlled. BLE chronic edema trace only. Left foot external deviated deformity.   Lymphadenopathy:    She has no cervical adenopathy.  Neurological: She is alert. No cranial nerve deficit. Coordination normal.  Skin: Skin is warm and dry. Rash noted.  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 R+L   Small open areas lateral left lower leg, anterior right lower leg. Pressure ulcers at the R+L intergluteal cleft, stage II, healthy wound beds, small amount of bleeding, no s/s of infection.    Psychiatric: Her mood appears anxious. Her affect is not blunt, not labile and not inappropriate. Her speech is rapid and/or pressured (occasionally ). Her speech is not delayed and not slurred. She is hyperactive. She is not agitated, not aggressive, not slowed, not withdrawn, not actively hallucinating and not combative. Thought content is not paranoid and not delusional. She does not exhibit a depressed mood. She exhibits abnormal recent memory.  MMSE 21/30 08/2013    Labs reviewed: Basic Metabolic Panel:  Recent Labs  10/01/14 04/27/15 08/27/15  NA 136* 137 137  K 4.9 3.7 4.1  BUN 35* 28* 36*  CREATININE 0.9 0.9 1.2*    Liver Function  Tests:  Recent Labs  10/01/14 04/27/15 08/27/15  AST 20 17 88*  ALT 13 14 100*  ALKPHOS 80 101 120    CBC:  Recent Labs  04/30/15 06/02/15 1455 08/27/15  WBC 6.4 7.7 9.3  NEUTROABS  --  5.2  --   HGB 17.5* 16.5* 17.8*  HCT 51* 48.9* 50*  MCV  --  98.2  --   PLT 208 216 218    Lab Results  Component Value Date   TSH 2.37 08/27/2015   Lab Results  Component Value Date   HGBA1C 8.7* 07/24/2015   Lab Results  Component Value Date   CHOL  10/22/2010    100        ATP III CLASSIFICATION:  <200     mg/dL   Desirable  200-239  mg/dL   Borderline High  >=240    mg/dL   High          HDL 55 10/22/2010   LDLCALC  10/22/2010    29        Total Cholesterol/HDL:CHD Risk Coronary Heart Disease Risk Table                     Men   Women  1/2 Average Risk   3.4   3.3  Average Risk       5.0   4.4  2 X Average Risk   9.6   7.1  3 X Average Risk  23.4   11.0        Use the calculated Patient Ratio above and the CHD Risk Table to determine the patient's CHD Risk.        ATP III CLASSIFICATION (LDL):  <100     mg/dL   Optimal  100-129  mg/dL   Near or Above                    Optimal  130-159  mg/dL   Borderline  160-189  mg/dL   High  >190     mg/dL   Very High   TRIG 80 10/22/2010   CHOLHDL 1.8 10/22/2010    Significant Diagnostic Results since last visit: none  Patient Care Team: Reynold Bowen, MD as PCP - General (Endocrinology) Darlin Coco, MD (Cardiology) Arloa Koh, MD (Radiation Oncology) Neldon Mc, MD (General Surgery)  Assessment/Plan Problem List Items Addressed This Visit    HTN (hypertension) (Chronic)    Controlled, continue Losartan 25mg  daily, Atenolol 20mg  bid, Furosemide 40mg  bid, Spironolactone 25mg , Imdur 60mg       Edema (Chronic)    Chronic venous insufficiency. Continue Furosemide 40mg  bid, Spironolctone25mg  daily. Trace edema BLE      A-fib (HCC) (Chronic)    02/03/14 doing well, EKG A-fib with controlled VR.  Heart  rate is in control, continue Atenolol.       Dementia with behavioral disturbance (Chronic)    Gradual decline. Continue Namenda 28mg  daily to preserve memory      UTI (urinary tract infection) - Primary (Chronic)    04/22/14 Urine culture E Coli 7 day course of Cipro 500mg  bid.  10/01/14 Urine culture pos nitrate, large leukocyte esterase, many bacteria, wbc 21-50. Empirical ABT Nitrofurantoinin 100mg  bid x 7 days started 11/181/5.  05/17/15 urine culture E Coli, 05/18/15 7 day course Cipro 250mg  bid 07/30/15 urine culture E.  Coli Nitrofurantoin 100mg  bid x 7 days 09/20/15 urine culture, K. P[place]umoniae 7 day course Cipro 500mg  bid       Controlled type 2 DM with peripheral circulatory disorder (HCC) (Chronic)    Continue Lantus 18u qd sq, Novolog 8u with meals for CBG >100 for better CBG control, last Hgb A1c 8.7 07/24/15      Depression    Stable, continue Cymbalta 30mg  since 06/29/15.       Essential tremor    Mild head titubation and hands tremor-not disabling, continue Propranolol 10mg  bid      Back pain    Chronic issue, pain is controlled, continue Fentanyl 13mcg/hr       Constipation    Stable, continue colace 100 II qhs, Linzess 16mcg, MiraLax prn      GERD (gastroesophageal reflux disease)    Asymptomatic, continue Pepcid 20mg            Family/ staff Communication: continue SNF for care needs,  Labs/tests ordered:  none  [facility] Paxton Kanaan NP [facility] [address], [place] [address] On Call:  [phone] & follow prompts after 5pm & weekends Office Phone:  [phone] Office Fax:  [phone]

## 2015-09-25 NOTE — Assessment & Plan Note (Signed)
Gradual decline. Continue Namenda 28mg daily to preserve memory  

## 2015-09-25 NOTE — Assessment & Plan Note (Signed)
Controlled, continue Losartan 25mg daily, Atenolol 20mg bid, Furosemide 40mg bid, Spironolactone 25mg, Imdur 60mg 

## 2015-10-23 ENCOUNTER — Encounter: Payer: Self-pay | Admitting: Nurse Practitioner

## 2015-10-23 ENCOUNTER — Non-Acute Institutional Stay (SKILLED_NURSING_FACILITY): Payer: Medicare Other | Admitting: Nurse Practitioner

## 2015-10-23 DIAGNOSIS — E1151 Type 2 diabetes mellitus with diabetic peripheral angiopathy without gangrene: Secondary | ICD-10-CM | POA: Diagnosis not present

## 2015-10-23 DIAGNOSIS — F03918 Unspecified dementia, unspecified severity, with other behavioral disturbance: Secondary | ICD-10-CM

## 2015-10-23 DIAGNOSIS — R609 Edema, unspecified: Secondary | ICD-10-CM | POA: Diagnosis not present

## 2015-10-23 DIAGNOSIS — I10 Essential (primary) hypertension: Secondary | ICD-10-CM | POA: Diagnosis not present

## 2015-10-23 DIAGNOSIS — F329 Major depressive disorder, single episode, unspecified: Secondary | ICD-10-CM

## 2015-10-23 DIAGNOSIS — I482 Chronic atrial fibrillation: Secondary | ICD-10-CM | POA: Diagnosis not present

## 2015-10-23 DIAGNOSIS — M5440 Lumbago with sciatica, unspecified side: Secondary | ICD-10-CM

## 2015-10-23 DIAGNOSIS — E039 Hypothyroidism, unspecified: Secondary | ICD-10-CM | POA: Diagnosis not present

## 2015-10-23 DIAGNOSIS — F0391 Unspecified dementia with behavioral disturbance: Secondary | ICD-10-CM | POA: Diagnosis not present

## 2015-10-23 DIAGNOSIS — K5900 Constipation, unspecified: Secondary | ICD-10-CM

## 2015-10-23 DIAGNOSIS — K219 Gastro-esophageal reflux disease without esophagitis: Secondary | ICD-10-CM | POA: Diagnosis not present

## 2015-10-23 DIAGNOSIS — I4821 Permanent atrial fibrillation: Secondary | ICD-10-CM

## 2015-10-23 DIAGNOSIS — G250 Essential tremor: Secondary | ICD-10-CM | POA: Diagnosis not present

## 2015-10-23 DIAGNOSIS — F32A Depression, unspecified: Secondary | ICD-10-CM

## 2015-10-23 NOTE — Assessment & Plan Note (Signed)
Continue Lantus 18u qd sq, Novolog 8u with meals for CBG >100 for better CBG control, last Hgb A1c 8.7 07/24/15  

## 2015-10-23 NOTE — Assessment & Plan Note (Signed)
Continue Levothyroxine 112.5mcg Sat, 75mcg M, T, W, Thr, F, and Sun, last TSH 08/27/15 2.365 

## 2015-10-23 NOTE — Assessment & Plan Note (Signed)
Stable, continue Cymbalta 30mg since 06/29/15. 

## 2015-10-23 NOTE — Assessment & Plan Note (Signed)
Chronic venous insufficiency. Continue Furosemide 40mg bid, Spironolctone25mg daily. Trace edema BLE.   

## 2015-10-23 NOTE — Assessment & Plan Note (Signed)
Controlled, continue Losartan 25mg daily, Atenolol 20mg bid, Furosemide 40mg bid, Spironolactone 25mg, Imdur 60mg 

## 2015-10-23 NOTE — Assessment & Plan Note (Signed)
02/03/14 doing well, EKG A-fib with controlled VR.  Heart rate is in control, continue Atenolol.  

## 2015-10-23 NOTE — Assessment & Plan Note (Signed)
Mild head titubation and hands tremor-not disabling, continue Propranolol 10mg bid 

## 2015-10-23 NOTE — Assessment & Plan Note (Signed)
Gradual decline. Continue Namenda 28mg daily to preserve memory  

## 2015-10-23 NOTE — Progress Notes (Signed)
Patient ID: [name], female   DOB: 08-Jun-1926, 81 y.o.   MRN: [id number]  Location:  SNF [facility] Provider:  Marlana Latus NP  Code Status:  DNR Goals of care: Advanced Directive information    Chief Complaint  Patient presents with  . Medical Management of Chronic Issues     HPI: Patient is a 81 y.o. female seen in the SNF at [facility] today for evaluation of HTN, depression, dementia, blood sugar runs150-200,  pressure ulcers at the R+L intergluteal cleft, stage II, slow healing.   Review of Systems  Constitutional: Negative for fever, chills and diaphoresis.       C/o aches allover  HENT: Positive for hearing loss. Negative for congestion, ear discharge and ear pain.   Eyes: Negative for pain, discharge and redness.  Respiratory: Negative for cough, shortness of breath and wheezing.   Cardiovascular: Positive for leg swelling. Negative for chest pain and palpitations.  Gastrointestinal: Negative for nausea, vomiting, abdominal pain and constipation.  Genitourinary: Positive for frequency. Negative for dysuria and urgency.       Recurrent urinary tract infections  Musculoskeletal: Positive for back pain. Negative for myalgias and neck pain.  Skin: Negative for rash.       Long standing excoriated buttocks with pressure ulcers L+R buttocks, pressure ulcers at the R+L intergluteal cleft, stage II, healthy wound beds, small amount of bleeding, no s/s of infection.    Neurological: Negative for dizziness, tremors, seizures, weakness and headaches.  Endo/Heme/Allergies: Negative for polydipsia.  Psychiatric/Behavioral: Positive for memory loss. Negative for suicidal ideas and hallucinations. The patient is nervous/anxious.        More confused    Past Medical History  Diagnosis Date  . Diabetes mellitus   . Hypertension   . Heart murmur   . Benign familial tremor   . Rosacea   . Diverticulitis   . Heart failure     diastolic heart failure  . TIA (transient ischemic  attack) 2010  . Arthritis   . Breast cancer ([place])     breast -rt  . CAD (coronary artery disease)     single vessel CAD per cath in 2006; scattered nonobstructive disease in the left system, left to right collaterals to the RCA which was occluded by flush shots; she has been managed medically  . Polycythemia 06/05/2015    Normal erythropoietin; O2 sat 95% room air     Patient Active Problem List   Diagnosis Date Noted  . Liver enzyme elevation 09/04/2015  . GERD (gastroesophageal reflux disease) 08/25/2015  . Blisters of multiple sites 08/07/2015  . Traumatic hematoma of left knee 08/07/2015  . Polycythemia 06/05/2015  . Erythrocytosis 05/01/2015  . Controlled type 2 DM with peripheral circulatory disorder ([place]) 11/04/2014  . Herpes zoster 09/30/2014  . Right-sided chest wall pain 09/30/2014  . Plantar fasciitis of left foot 07/18/2014  . Fecal incontinence 06/10/2014  . Left leg pain 04/18/2014  . Other malaise and fatigue 04/18/2014  . UTI (urinary tract infection) 02/09/2014  . Venous stasis dermatitis of both lower extremities 12/10/2013  . Candidiasis of skin 09/10/2013  . Dementia with behavioral disturbance 08/23/2013  . Right hip pain 08/13/2013  . PAF (paroxysmal atrial fibrillation) ([place]) 07/26/2013  . A-fib ([place]) 07/23/2013  . Edema 06/27/2013  . Ulcer of foot due to diabetes mellitus ([place]) 06/27/2013  . Pressure ulcer stage II 06/18/2013  . Falls frequently 04/23/2013  . DNR (do not resuscitate) 04/23/2013  . Type 2 diabetes mellitus  with diabetic neuropathy affecting both sides of body ([place]) 02/26/2013  . Hypothyroidism 02/26/2013  . Depression 02/26/2013  . Essential tremor 02/26/2013  . Back pain 02/26/2013  . HTN (hypertension) 02/26/2013  . Constipation 02/26/2013  . Ischemic heart disease 10/02/2012  . Leg swelling 10/02/2012  . Dyspepsia 10/02/2012  . Diabetic foot ulcer ([place]) 07/15/2011  . 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 Penicillins Other (See Comments)    Per MAR  . Bee Venom Other (See Comments)    Per MAR  . Celebrex [Celecoxib] Other (See Comments)    Per MAR  . Lidocaine Hcl Other (See Comments)    Per MAR  . Phenobarbital Other (See Comments)    Per MAR  . Procaine Hcl Other (See Comments)    Per mar    Medications: Patient's Medications  New Prescriptions   No medications on file  Previous Medications   ACETAMINOPHEN (TYLENOL) 325 MG TABLET    Take 2 tablets (650 mg total) by mouth every 4 (four) hours as needed.   ALUM & MAG HYDROXIDE-SIMETH (MAALOX/MYLANTA) 200-200-20 MG/5ML SUSPENSION    Take 30 mLs by mouth every 6 (six) hours as needed for indigestion or heartburn.   ASPIRIN EC 81 MG TABLET    Take 81 mg by mouth daily.   AYR SALINE NASAL NO-DRIP GEL    Place into the nose 2 (two) times daily as needed (nose bleeds).   DOCUSATE SODIUM (COLACE) 100 MG CAPSULE    Take 200 mg by mouth at bedtime.    DORZOLAMIDE-TIMOLOL (COSOPT) 22.3-6.8 MG/ML OPHTHALMIC SOLUTION    Place 1 drop into the left eye every 12 (twelve) hours.   DULOXETINE (CYMBALTA) 30 MG CAPSULE    Take 30 mg by mouth 2 (two) times daily.   FENTANYL (DURAGESIC - DOSED MCG/HR) 100 MCG/HR    Apply one patch topically every 3 days for pain. Remove old patch before applying new patch.   FUROSEMIDE (LASIX) 40 MG TABLET    Take 40 mg by mouth 2 (two) times daily.    GLUCAGON (GLUCAGEN) 1 MG SOLR INJECTION    Inject 1 mg into the vein once as needed for low blood sugar.   GLUCOSAMINE-CHONDROITIN 500-400 MG TABLET    Take 1 tablet by mouth 3 (three) times daily.   GUAIFENESIN (ROBITUSSIN) 100 MG/5ML SYRUP    Take 300 mg by mouth 3 (three) times daily as needed for cough.   HYDROXYZINE (ATARAX/VISTARIL) 25 MG TABLET    Take 25 mg by mouth at bedtime as needed. For itching   INSULIN ASPART (NOVOLOG) 100 UNIT/ML INJECTION    Inject 5 Units into the skin 3 (three) times daily with meals. 8 units If CBG  is greater than 150 at lunch and 12 units if CBG is greater than 150 at breakfast and dinner   INSULIN GLARGINE (LANTUS) 100 UNIT/ML INJECTION    Inject 18 Units into the skin at bedtime.    ISOSORBIDE MONONITRATE (IMDUR) 60 MG 24 HR TABLET    Take 60 mg by mouth every morning.    LEVOTHYROXINE (SYNTHROID, LEVOTHROID) 75 MCG TABLET    Take 75-112.5 mcg by mouth daily. Take 1 and 1/2 tablets on Saturdays then 1 tablet all other days   LINZESS 145 MCG CAPS CAPSULE    Take 145 mcg by mouth daily. Take 30 mins prior to breakfast on an empty stomach.   LORATADINE (CLARITIN) 10 MG TABLET  Take 10 mg by mouth daily.    LOSARTAN (COZAAR) 25 MG TABLET    Take 25 mg by mouth daily.   LUBIPROSTONE (AMITIZA) 24 MCG CAPSULE    Take 24 mcg by mouth 2 (two) times daily with a meal.     MEMANTINE HCL ER (NAMENDA XR) 28 MG CP24    Take by mouth.   METHYLCELLULOSE (ARTIFICIAL TEARS) 1 % OPHTHALMIC SOLUTION    Place 1 drop into both eyes 4 (four) times daily as needed (for dryness).   MULTIPLE VITAMINS-MINERALS (CERTAVITE SENIOR/ANTIOXIDANT PO)    Take 1 tablet by mouth daily.   NITROGLYCERIN (NITROSTAT) 0.4 MG SL TABLET    Place 0.4 mg under the tongue every 5 (five) minutes as needed. For chest pain   NUTRITIONAL SUPPLEMENTS (RESOURCE 2.0 PO)    Take 120 mLs by mouth 3 (three) times daily.   OMEPRAZOLE (PRILOSEC) 20 MG CAPSULE    Take 20 mg by mouth daily.    PROBIOTIC PRODUCT (ALIGN) 4 MG CAPS    Take 1 capsule by mouth daily.   PROPRANOLOL (INDERAL) 20 MG TABLET    Take 20 mg by mouth 2 (two) times daily.   SPIRONOLACTONE (ALDACTONE) 25 MG TABLET    Take 25 mg by mouth daily.    VITAMIN D, ERGOCALCIFEROL, (DRISDOL) 50000 UNITS CAPS    Take 50,000 Units by mouth every 30 (thirty) days. On the first of the month  Modified Medications   No medications on file  Discontinued Medications   No medications on file    Physical Exam: Filed Vitals:   10/23/15 1102  BP: 125/61  Pulse: 68  Temp: 98.4 F (36.9 C)   TempSrc: Tympanic  Resp: 19   There is no weight on file to calculate BMI.  Physical Exam  Constitutional: She appears well-developed.  HENT:  Head: Normocephalic and atraumatic.  Left Ear: External ear normal.  Eyes: Conjunctivae and EOM are normal. Pupils are equal, round, and reactive to light.  Neck: Neck supple. No JVD present. No thyromegaly present.  Cardiovascular: Normal rate and regular rhythm.   Murmur heard. Pulmonary/Chest: Effort normal. She has rales.  Bibasilar rales.   Abdominal: Soft. There is no tenderness. There is no rebound.  Musculoskeletal: She exhibits edema and tenderness.  Back pain is chronic which is well controlled. BLE chronic edema trace only. Left foot external deviated deformity.   Lymphadenopathy:    She has no cervical adenopathy.  Neurological: She is alert. No cranial nerve deficit. Coordination normal.  Skin: Skin is warm and dry. Rash noted.  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 R+L   Small open areas lateral left lower leg, anterior right lower leg. Pressure ulcers at the R+L intergluteal cleft, stage II, healthy wound beds, small amount of bleeding, no s/s of infection.    Psychiatric: Her mood appears anxious. Her affect is not blunt, not labile and not inappropriate. Her speech is rapid and/or pressured (occasionally ). Her speech is not delayed and not slurred. She is hyperactive. She is not agitated, not aggressive, not slowed, not withdrawn, not actively hallucinating and not combative. Thought content is not paranoid and not delusional. She does not exhibit a depressed mood. She exhibits abnormal recent memory.  MMSE 21/30 08/2013    Labs reviewed: Basic Metabolic Panel:  Recent Labs  04/27/15 08/27/15  NA 137 137  K 3.7 4.1  BUN 28* 36*  CREATININE 0.9 1.2*    Liver Function Tests:  Recent Labs  04/27/15 08/27/15  AST 17 88*  ALT 14 100*  ALKPHOS 101 120    CBC:  Recent Labs  04/30/15 06/02/15 1455 08/27/15  WBC 6.4  7.7 9.3  NEUTROABS  --  5.2  --   HGB 17.5* 16.5* 17.8*  HCT 51* 48.9* 50*  MCV  --  98.2  --   PLT 208 216 218    Lab Results  Component Value Date   TSH 2.37 08/27/2015   Lab Results  Component Value Date   HGBA1C 8.7* 07/24/2015   Lab Results  Component Value Date   CHOL  10/22/2010    100        ATP III CLASSIFICATION:  <200     mg/dL   Desirable  200-239  mg/dL   Borderline High  >=240    mg/dL   High          HDL 55 10/22/2010   LDLCALC  10/22/2010    29        Total Cholesterol/HDL:CHD Risk Coronary Heart Disease Risk Table                     Men   Women  1/2 Average Risk   3.4   3.3  Average Risk       5.0   4.4  2 X Average Risk   9.6   7.1  3 X Average Risk  23.4   11.0        Use the calculated Patient Ratio above and the CHD Risk Table to determine the patient's CHD Risk.        ATP III CLASSIFICATION (LDL):  <100     mg/dL   Optimal  100-129  mg/dL   Near or Above                    Optimal  130-159  mg/dL   Borderline  160-189  mg/dL   High  >190     mg/dL   Very High   TRIG 80 10/22/2010   CHOLHDL 1.8 10/22/2010    Significant Diagnostic Results since last visit: none  Patient Care Team: Reynold Bowen, MD as PCP - General (Endocrinology) Darlin Coco, MD (Cardiology) Arloa Koh, MD (Radiation Oncology) Neldon Mc, MD (General Surgery)  Assessment/Plan Problem List Items Addressed This Visit    A-fib [facility]) - Primary (Chronic)    02/03/14 doing well, EKG A-fib with controlled VR.  Heart rate is in control, continue Atenolol.       Back pain    Chronic issue, pain is controlled, continue Fentanyl 145mcg/hr      Constipation    Stable, continue colace 100 II qhs, Linzess 139mcg, MiraLax prn      Controlled type 2 DM with peripheral circulatory disorder (HCC) (Chronic)    Continue Lantus 18u qd sq, Novolog 8u with meals for CBG >100 for better CBG control, last Hgb A1c 8.7 07/24/15      Dementia with behavioral  disturbance (Chronic)    Gradual decline. Continue Namenda 28mg  daily to preserve memory      Depression    Stable, continue Cymbalta 30mg  since 06/29/15      Edema (Chronic)    Chronic venous insufficiency. Continue Furosemide 40mg  bid, Spironolctone25mg  daily. Trace edema BLE      Essential tremor    Mild head titubation and hands tremor-not disabling, continue Propranolol 10mg  bid      GERD (gastroesophageal reflux disease)    Asymptomatic, continue Pepcid  20mg       HTN (hypertension) (Chronic)    Controlled, continue Losartan 25mg  daily, Atenolol 20mg  bid, Furosemide 40mg  bid, Spironolactone 25mg , Imdur 60mg       Hypothyroidism (Chronic)    Continue Levothyroxine 112.54mcg Sat, 72mcg M, T, W, Thr, F, and Sun, last TSH 08/27/15 2.365          Family/ staff Communication: continue SNF for care needs,  Labs/tests ordered:  none  [facility] Carmaleta Youngers NP [facility] [address], [place] [address] On Call:  [phone] & follow prompts after 5pm & weekends Office Phone:  [phone] Office Fax:  [phone]

## 2015-10-23 NOTE — Assessment & Plan Note (Signed)
Stable, continue colace 100 II qhs, Linzess 145mcg, MiraLax prn. 

## 2015-10-23 NOTE — Assessment & Plan Note (Signed)
Chronic issue, pain is controlled, continue Fentanyl 100mcg/hr 

## 2015-10-23 NOTE — Assessment & Plan Note (Signed)
Asymptomatic, continue Pepcid 20mg  

## 2015-10-27 ENCOUNTER — Encounter: Payer: Self-pay | Admitting: Nurse Practitioner

## 2015-10-27 ENCOUNTER — Non-Acute Institutional Stay (SKILLED_NURSING_FACILITY): Payer: Medicare Other | Admitting: Nurse Practitioner

## 2015-10-27 DIAGNOSIS — G250 Essential tremor: Secondary | ICD-10-CM | POA: Diagnosis not present

## 2015-10-27 DIAGNOSIS — E1142 Type 2 diabetes mellitus with diabetic polyneuropathy: Secondary | ICD-10-CM

## 2015-10-27 DIAGNOSIS — F0391 Unspecified dementia with behavioral disturbance: Secondary | ICD-10-CM | POA: Diagnosis not present

## 2015-10-27 DIAGNOSIS — R609 Edema, unspecified: Secondary | ICD-10-CM

## 2015-10-27 DIAGNOSIS — E1151 Type 2 diabetes mellitus with diabetic peripheral angiopathy without gangrene: Secondary | ICD-10-CM | POA: Diagnosis not present

## 2015-10-27 DIAGNOSIS — I10 Essential (primary) hypertension: Secondary | ICD-10-CM

## 2015-10-27 DIAGNOSIS — M5440 Lumbago with sciatica, unspecified side: Secondary | ICD-10-CM

## 2015-10-27 DIAGNOSIS — F329 Major depressive disorder, single episode, unspecified: Secondary | ICD-10-CM

## 2015-10-27 DIAGNOSIS — F03918 Unspecified dementia, unspecified severity, with other behavioral disturbance: Secondary | ICD-10-CM

## 2015-10-27 DIAGNOSIS — I4821 Permanent atrial fibrillation: Secondary | ICD-10-CM

## 2015-10-27 DIAGNOSIS — I482 Chronic atrial fibrillation: Secondary | ICD-10-CM | POA: Diagnosis not present

## 2015-10-27 DIAGNOSIS — K5900 Constipation, unspecified: Secondary | ICD-10-CM

## 2015-10-27 DIAGNOSIS — F32A Depression, unspecified: Secondary | ICD-10-CM

## 2015-10-27 NOTE — Assessment & Plan Note (Signed)
02/03/14 doing well, EKG A-fib with controlled VR.  Heart rate is in control, continue Atenolol.  

## 2015-10-27 NOTE — Assessment & Plan Note (Signed)
Stable, continue colace 100 II qhs, Linzess 145mcg, MiraLax prn. 

## 2015-10-27 NOTE — Progress Notes (Signed)
Patient ID: [name], female   DOB: [birth year], 81 y.o.   MRN: [id number]  Location:  SNF [facility] Provider:  Marlana Latus NP  Code Status:  DNR Goals of care: Advanced Directive information    Chief Complaint  Patient presents with  . Medical Management of Chronic Issues  . Acute Visit    fall, confusion.     HPI: Patient is a 81 y.o. female seen in the SNF at [facility] today for evaluation of fall 10/27/15, no apparent injury, increased confusion noted, no new focal neurological deficits, denied chest pain, palpitation. HTN, depression, dementia, blood sugar runs150-200,  pressure ulcers at the R+L intergluteal cleft, stage II, slow healing.   Review of Systems  Constitutional: Negative for fever, chills and diaphoresis.       C/o aches allover  HENT: Positive for hearing loss. Negative for congestion, ear discharge and ear pain.   Eyes: Negative for pain, discharge and redness.  Respiratory: Negative for cough, shortness of breath and wheezing.   Cardiovascular: Positive for leg swelling. Negative for chest pain and palpitations.  Gastrointestinal: Negative for nausea, vomiting, abdominal pain and constipation.  Genitourinary: Positive for frequency. Negative for dysuria and urgency.       Recurrent urinary tract infections  Musculoskeletal: Positive for back pain. Negative for myalgias and neck pain.  Skin: Negative for rash.       Long standing excoriated buttocks with pressure ulcers L+R buttocks, pressure ulcers at the R+L intergluteal cleft, stage II, healthy wound beds, small amount of bleeding, no s/s of infection.    Neurological: Negative for dizziness, tremors, seizures, weakness and headaches.  Endo/Heme/Allergies: Negative for polydipsia.  Psychiatric/Behavioral: Positive for memory loss. Negative for suicidal ideas and hallucinations. The patient is nervous/anxious.        More confused    Past Medical History  Diagnosis Date  . Diabetes mellitus   .  Hypertension   . Heart murmur   . Benign familial tremor   . Rosacea   . Diverticulitis   . Heart failure     diastolic heart failure  . TIA (transient ischemic attack) 2010  . Arthritis   . Breast cancer ([place])     breast -rt  . CAD (coronary artery disease)     single vessel CAD per cath in 2006; scattered nonobstructive disease in the left system, left to right collaterals to the RCA which was occluded by flush shots; she has been managed medically  . Polycythemia 06/05/2015    Normal erythropoietin; O2 sat 95% room air     Patient Active Problem List   Diagnosis Date Noted  . Liver enzyme elevation 09/04/2015  . GERD (gastroesophageal reflux disease) 08/25/2015  . Blisters of multiple sites 08/07/2015  . Traumatic hematoma of left knee 08/07/2015  . Polycythemia 06/05/2015  . Erythrocytosis 05/01/2015  . Controlled type 2 DM with peripheral circulatory disorder ([place]) 11/04/2014  . Herpes zoster 09/30/2014  . Right-sided chest wall pain 09/30/2014  . Plantar fasciitis of left foot 07/18/2014  . Fecal incontinence 06/10/2014  . Left leg pain 04/18/2014  . Other malaise and fatigue 04/18/2014  . UTI (urinary tract infection) 02/09/2014  . Venous stasis dermatitis of both lower extremities 12/10/2013  . Candidiasis of skin 09/10/2013  . Dementia with behavioral disturbance 08/23/2013  . Right hip pain 08/13/2013  . PAF (paroxysmal atrial fibrillation) ([place]) 07/26/2013  . A-fib ([place]) 07/23/2013  . Edema 06/27/2013  . Ulcer of foot due to diabetes mellitus ([place])  06/27/2013  . Pressure ulcer stage II 06/18/2013  . Falls frequently 04/23/2013  . DNR (do not resuscitate) 04/23/2013  . Type 2 diabetes mellitus with diabetic neuropathy affecting both sides of body ([place]) 02/26/2013  . Hypothyroidism 02/26/2013  . Depression 02/26/2013  . Essential tremor 02/26/2013  . Back pain 02/26/2013  . HTN (hypertension) 02/26/2013  . Constipation 02/26/2013  . Ischemic heart disease  10/02/2012  . Leg swelling 10/02/2012  . Dyspepsia 10/02/2012  . Diabetic foot ulcer ([place]) 07/15/2011  . 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 Penicillins Other (See Comments)    Per MAR  . Bee Venom Other (See Comments)    Per MAR  . Celebrex [Celecoxib] Other (See Comments)    Per MAR  . Lidocaine Hcl Other (See Comments)    Per MAR  . Phenobarbital Other (See Comments)    Per MAR  . Procaine Hcl Other (See Comments)    Per mar    Medications: Patient's Medications  New Prescriptions   No medications on file  Previous Medications   ACETAMINOPHEN (TYLENOL) 325 MG TABLET    Take 2 tablets (650 mg total) by mouth every 4 (four) hours as needed.   ALUM & MAG HYDROXIDE-SIMETH (MAALOX/MYLANTA) 200-200-20 MG/5ML SUSPENSION    Take 30 mLs by mouth every 6 (six) hours as needed for indigestion or heartburn.   ASPIRIN EC 81 MG TABLET    Take 81 mg by mouth daily.   AYR SALINE NASAL NO-DRIP GEL    Place into the nose 2 (two) times daily as needed (nose bleeds).   DOCUSATE SODIUM (COLACE) 100 MG CAPSULE    Take 200 mg by mouth at bedtime.    DORZOLAMIDE-TIMOLOL (COSOPT) 22.3-6.8 MG/ML OPHTHALMIC SOLUTION    Place 1 drop into the left eye every 12 (twelve) hours.   DULOXETINE (CYMBALTA) 30 MG CAPSULE    Take 30 mg by mouth 2 (two) times daily.   FENTANYL (DURAGESIC - DOSED MCG/HR) 100 MCG/HR    Apply one patch topically every 3 days for pain. Remove old patch before applying new patch.   FUROSEMIDE (LASIX) 40 MG TABLET    Take 40 mg by mouth 2 (two) times daily.    GLUCAGON (GLUCAGEN) 1 MG SOLR INJECTION    Inject 1 mg into the vein once as needed for low blood sugar.   GLUCOSAMINE-CHONDROITIN 500-400 MG TABLET    Take 1 tablet by mouth 3 (three) times daily.   GUAIFENESIN (ROBITUSSIN) 100 MG/5ML SYRUP    Take 300 mg by mouth 3 (three) times daily as needed for cough.   HYDROXYZINE (ATARAX/VISTARIL) 25 MG TABLET    Take 25 mg by mouth at  bedtime as needed. For itching   INSULIN ASPART (NOVOLOG) 100 UNIT/ML INJECTION    Inject 5 Units into the skin 3 (three) times daily with meals. 8 units If CBG is greater than 150 at lunch and 12 units if CBG is greater than 150 at breakfast and dinner   INSULIN GLARGINE (LANTUS) 100 UNIT/ML INJECTION    Inject 18 Units into the skin at bedtime.    ISOSORBIDE MONONITRATE (IMDUR) 60 MG 24 HR TABLET    Take 60 mg by mouth every morning.    LEVOTHYROXINE (SYNTHROID, LEVOTHROID) 75 MCG TABLET    Take 75-112.5 mcg by mouth daily. Take 1 and 1/2 tablets on Saturdays then 1 tablet all other days   LINZESS 145 MCG CAPS CAPSULE  Take 145 mcg by mouth daily. Take 30 mins prior to breakfast on an empty stomach.   LORATADINE (CLARITIN) 10 MG TABLET    Take 10 mg by mouth daily.    LOSARTAN (COZAAR) 25 MG TABLET    Take 25 mg by mouth daily.   LUBIPROSTONE (AMITIZA) 24 MCG CAPSULE    Take 24 mcg by mouth 2 (two) times daily with a meal.     MEMANTINE HCL ER (NAMENDA XR) 28 MG CP24    Take by mouth.   METHYLCELLULOSE (ARTIFICIAL TEARS) 1 % OPHTHALMIC SOLUTION    Place 1 drop into both eyes 4 (four) times daily as needed (for dryness).   MULTIPLE VITAMINS-MINERALS (CERTAVITE SENIOR/ANTIOXIDANT PO)    Take 1 tablet by mouth daily.   NITROGLYCERIN (NITROSTAT) 0.4 MG SL TABLET    Place 0.4 mg under the tongue every 5 (five) minutes as needed. For chest pain   NUTRITIONAL SUPPLEMENTS (RESOURCE 2.0 PO)    Take 120 mLs by mouth 3 (three) times daily.   OMEPRAZOLE (PRILOSEC) 20 MG CAPSULE    Take 20 mg by mouth daily.    PROBIOTIC PRODUCT (ALIGN) 4 MG CAPS    Take 1 capsule by mouth daily.   PROPRANOLOL (INDERAL) 20 MG TABLET    Take 20 mg by mouth 2 (two) times daily.   SPIRONOLACTONE (ALDACTONE) 25 MG TABLET    Take 25 mg by mouth daily.    VITAMIN D, ERGOCALCIFEROL, (DRISDOL) 50000 UNITS CAPS    Take 50,000 Units by mouth every 30 (thirty) days. On the first of the month  Modified Medications   No medications on  file  Discontinued Medications   No medications on file    Physical Exam: Filed Vitals:   10/27/15 1258  BP: 130/80  Pulse: 78  Temp: 97.4 F (36.3 C)  TempSrc: Tympanic  Resp: 20   There is no weight on file to calculate BMI.  Physical Exam  Constitutional: She appears well-developed.  HENT:  Head: Normocephalic and atraumatic.  Left Ear: External ear normal.  Eyes: Conjunctivae and EOM are normal. Pupils are equal, round, and reactive to light.  Neck: Neck supple. No JVD present. No thyromegaly present.  Cardiovascular: Normal rate and regular rhythm.   Murmur heard. Pulmonary/Chest: Effort normal. She has rales.  Bibasilar rales.   Abdominal: Soft. There is no tenderness. There is no rebound.  Musculoskeletal: She exhibits edema and tenderness.  Back pain is chronic which is well controlled. BLE chronic edema trace only. Left foot external deviated deformity.   Lymphadenopathy:    She has no cervical adenopathy.  Neurological: She is alert. No cranial nerve deficit. Coordination normal.  Skin: Skin is warm and dry. Rash noted.  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 R+L   Small open areas lateral left lower leg, anterior right lower leg. Pressure ulcers at the R+L intergluteal cleft, stage II, healthy wound beds, small amount of bleeding, no s/s of infection.    Psychiatric: Her mood appears anxious. Her affect is not blunt, not labile and not inappropriate. Her speech is rapid and/or pressured (occasionally ). Her speech is not delayed and not slurred. She is hyperactive. She is not agitated, not aggressive, not slowed, not withdrawn, not actively hallucinating and not combative. Thought content is not paranoid and not delusional. She does not exhibit a depressed mood. She exhibits abnormal recent memory.  MMSE 21/30 08/2013    Labs reviewed: Basic Metabolic Panel:  Recent Labs  04/27/15 08/27/15  NA  137 137  K 3.7 4.1  BUN 28* 36*  CREATININE 0.9 1.2*    Liver  Function Tests:  Recent Labs  04/27/15 08/27/15  AST 17 88*  ALT 14 100*  ALKPHOS 101 120    CBC:  Recent Labs  04/30/15 06/02/15 1455 08/27/15  WBC 6.4 7.7 9.3  NEUTROABS  --  5.2  --   HGB 17.5* 16.5* 17.8*  HCT 51* 48.9* 50*  MCV  --  98.2  --   PLT 208 216 218    Lab Results  Component Value Date   TSH 2.37 08/27/2015   Lab Results  Component Value Date   HGBA1C 8.7* 07/24/2015   Lab Results  Component Value Date   CHOL  10/22/2010    100        ATP III CLASSIFICATION:  <200     mg/dL   Desirable  200-239  mg/dL   Borderline High  >=240    mg/dL   High          HDL 55 10/22/2010   LDLCALC  10/22/2010    29        Total Cholesterol/HDL:CHD Risk Coronary Heart Disease Risk Table                     Men   Women  1/2 Average Risk   3.4   3.3  Average Risk       5.0   4.4  2 X Average Risk   9.6   7.1  3 X Average Risk  23.4   11.0        Use the calculated Patient Ratio above and the CHD Risk Table to determine the patient's CHD Risk.        ATP III CLASSIFICATION (LDL):  <100     mg/dL   Optimal  100-129  mg/dL   Near or Above                    Optimal  130-159  mg/dL   Borderline  160-189  mg/dL   High  >190     mg/dL   Very High   TRIG 80 10/22/2010   CHOLHDL 1.8 10/22/2010    Significant Diagnostic Results since last visit: none  Patient Care Team: Reynold Bowen, MD as PCP - General (Endocrinology) Darlin Coco, MD (Cardiology) Arloa Koh, MD (Radiation Oncology) Neldon Mc, MD (General Surgery)  Assessment/Plan Problem List Items Addressed This Visit    A-fib [facility]) (Chronic)    02/03/14 doing well, EKG A-fib with controlled VR.  Heart rate is in control, continue Atenolol.       Back pain    Chronic issue, pain is controlled, continue Fentanyl 123mcg/hr      Constipation    Stable, continue colace 100 II qhs, Linzess 119mcg, MiraLax prn      Controlled type 2 DM with peripheral circulatory disorder (HCC) -  Primary (Chronic)    Continue Lantus 18u qd sq, Novolog 8u with meals for CBG >100 for better CBG control, last Hgb A1c 8.7 07/24/15, update Hgb A1c      Dementia with behavioral disturbance (Chronic)    Gradual decline. Continue Namenda 28mg  daily to preserve memory. Increased confusion w/o new focal neurological symptoms, will update UA C/S, CBC, CMP      Depression    Stable, continue Cymbalta 30mg  since 06/29/15      Edema (Chronic)    Chronic venous insufficiency. Continue Furosemide  40mg  bid, Spironolctone25mg  daily. Trace edema BLE      Essential tremor    Mild head titubation and hands tremor-not disabling, continue Propranolol 10mg  bid      HTN (hypertension) (Chronic)    Controlled, continue Losartan 25mg  daily, Atenolol 20mg  bid, Furosemide 40mg  bid, Spironolactone 25mg , Imdur 60mg . Update CMP      Type 2 diabetes mellitus with diabetic neuropathy affecting both sides of body (HCC) (Chronic)    Continue Lantus 18u qd sq, Novolog 8u with meals for CBG >100 for better CBG control, last Hgb A1c 8.7 07/24/15, Hgb a1c 9.2 01/26/15. Update Hgb A1c          Family/ staff Communication: continue SNF for care needs,  Labs/tests ordered:  CBC, CMP, Hgb A1c, UA C/S  [facility] Mast NP [facility] [address], [place] [address] On Call:  [phone] & follow prompts after 5pm & weekends Office Phone:  [phone] Office Fax:  [phone]

## 2015-10-27 NOTE — Assessment & Plan Note (Signed)
Controlled, continue Losartan 25mg  daily, Atenolol 20mg  bid, Furosemide 40mg  bid, Spironolactone 25mg , Imdur 60mg . Update CMP

## 2015-10-27 NOTE — Assessment & Plan Note (Signed)
Chronic venous insufficiency. Continue Furosemide 40mg bid, Spironolctone25mg daily. Trace edema BLE.   

## 2015-10-27 NOTE — Assessment & Plan Note (Signed)
Gradual decline. Continue Namenda 28mg  daily to preserve memory. Increased confusion w/o new focal neurological symptoms, will update UA C/S, CBC, CMP

## 2015-10-27 NOTE — Assessment & Plan Note (Signed)
Continue Lantus 18u qd sq, Novolog 8u with meals for CBG >100 for better CBG control, last Hgb A1c 8.7 07/24/15, update Hgb A1c

## 2015-10-27 NOTE — Assessment & Plan Note (Signed)
Stable, continue Cymbalta 30mg since 06/29/15. 

## 2015-10-27 NOTE — Assessment & Plan Note (Signed)
Continue Lantus 18u qd sq, Novolog 8u with meals for CBG >100 for better CBG control, last Hgb A1c 8.7 07/24/15, Hgb a1c 9.2 01/26/15. Update Hgb A1c

## 2015-10-27 NOTE — Assessment & Plan Note (Signed)
Mild head titubation and hands tremor-not disabling, continue Propranolol 10mg bid 

## 2015-10-27 NOTE — Assessment & Plan Note (Signed)
Chronic issue, pain is controlled, continue Fentanyl 100mcg/hr 

## 2015-10-29 LAB — BASIC METABOLIC PANEL
BUN: 35 mg/dL — AB (ref 4–21)
CREATININE: 1 mg/dL (ref 0.5–1.1)
Glucose: 43 mg/dL
Potassium: 3.8 mmol/L (ref 3.4–5.3)
Sodium: 140 mmol/L (ref 137–147)

## 2015-10-29 LAB — CBC AND DIFFERENTIAL
HEMATOCRIT: 45 % (ref 36–46)
Hemoglobin: 15.2 g/dL (ref 12.0–16.0)
PLATELETS: 171 10*3/uL (ref 150–399)
WBC: 6.7 10*3/mL

## 2015-10-29 LAB — HEPATIC FUNCTION PANEL
ALK PHOS: 79 U/L (ref 25–125)
ALT: 15 U/L (ref 7–35)
AST: 24 U/L (ref 13–35)
BILIRUBIN, TOTAL: 0.6 mg/dL

## 2015-10-29 LAB — HEMOGLOBIN A1C: HEMOGLOBIN A1C: 8.1

## 2015-10-31 ENCOUNTER — Encounter: Payer: Self-pay | Admitting: Nurse Practitioner

## 2015-10-31 ENCOUNTER — Non-Acute Institutional Stay (SKILLED_NURSING_FACILITY): Payer: Medicare Other | Admitting: Nurse Practitioner

## 2015-10-31 DIAGNOSIS — F329 Major depressive disorder, single episode, unspecified: Secondary | ICD-10-CM

## 2015-10-31 DIAGNOSIS — E1151 Type 2 diabetes mellitus with diabetic peripheral angiopathy without gangrene: Secondary | ICD-10-CM | POA: Diagnosis not present

## 2015-10-31 DIAGNOSIS — F0391 Unspecified dementia with behavioral disturbance: Secondary | ICD-10-CM

## 2015-10-31 DIAGNOSIS — N390 Urinary tract infection, site not specified: Secondary | ICD-10-CM

## 2015-10-31 DIAGNOSIS — E08621 Diabetes mellitus due to underlying condition with foot ulcer: Secondary | ICD-10-CM | POA: Diagnosis not present

## 2015-10-31 DIAGNOSIS — I482 Chronic atrial fibrillation: Secondary | ICD-10-CM

## 2015-10-31 DIAGNOSIS — M5440 Lumbago with sciatica, unspecified side: Secondary | ICD-10-CM | POA: Diagnosis not present

## 2015-10-31 DIAGNOSIS — G250 Essential tremor: Secondary | ICD-10-CM

## 2015-10-31 DIAGNOSIS — I10 Essential (primary) hypertension: Secondary | ICD-10-CM

## 2015-10-31 DIAGNOSIS — K5900 Constipation, unspecified: Secondary | ICD-10-CM

## 2015-10-31 DIAGNOSIS — F03918 Unspecified dementia, unspecified severity, with other behavioral disturbance: Secondary | ICD-10-CM

## 2015-10-31 DIAGNOSIS — K219 Gastro-esophageal reflux disease without esophagitis: Secondary | ICD-10-CM

## 2015-10-31 DIAGNOSIS — E039 Hypothyroidism, unspecified: Secondary | ICD-10-CM

## 2015-10-31 DIAGNOSIS — D751 Secondary polycythemia: Secondary | ICD-10-CM

## 2015-10-31 DIAGNOSIS — E1142 Type 2 diabetes mellitus with diabetic polyneuropathy: Secondary | ICD-10-CM

## 2015-10-31 DIAGNOSIS — F32A Depression, unspecified: Secondary | ICD-10-CM

## 2015-10-31 DIAGNOSIS — L8992 Pressure ulcer of unspecified site, stage 2: Secondary | ICD-10-CM

## 2015-10-31 DIAGNOSIS — R609 Edema, unspecified: Secondary | ICD-10-CM | POA: Diagnosis not present

## 2015-10-31 DIAGNOSIS — L97509 Non-pressure chronic ulcer of other part of unspecified foot with unspecified severity: Secondary | ICD-10-CM

## 2015-10-31 DIAGNOSIS — I4821 Permanent atrial fibrillation: Secondary | ICD-10-CM

## 2015-10-31 NOTE — Progress Notes (Signed)
Patient ID: [name], female   DOB: 26-Nov-1925, 81 y.o.   MRN: [id number]  Location:  SNF [facility] Provider:  Marlana Latus NP  Code Status:  DNR Goals of care: Advanced Directive information    Chief Complaint  Patient presents with  . Medical Management of Chronic Issues  . Acute Visit    hypoglycemia, UTI     HPI: Patient is a 81 y.o. female seen in the SNF at [facility] today for evaluation of fall 10/27/15, no apparent injury, increased confusion noted, no new focal neurological deficits, denied chest pain, palpitation. HTN, depression, dementia, blood sugar runs150-200,  pressure ulcers at the R+L intergluteal cleft, stage II, slow healing.   Review of Systems  Constitutional: Negative for fever, chills and diaphoresis.       C/o aches allover  HENT: Positive for hearing loss. Negative for congestion, ear discharge and ear pain.   Eyes: Negative for pain, discharge and redness.  Respiratory: Negative for cough, shortness of breath and wheezing.   Cardiovascular: Positive for leg swelling. Negative for chest pain and palpitations.  Gastrointestinal: Negative for nausea, vomiting, abdominal pain and constipation.  Genitourinary: Positive for frequency. Negative for dysuria and urgency.       Recurrent urinary tract infections  Musculoskeletal: Positive for back pain. Negative for myalgias and neck pain.  Skin: Negative for rash.       Long standing excoriated buttocks with pressure ulcers L+R buttocks, pressure ulcers at the R+L intergluteal cleft, stage II, healthy wound beds, small amount of bleeding, no s/s of infection.    Neurological: Negative for dizziness, tremors, seizures, weakness and headaches.  Endo/Heme/Allergies: Negative for polydipsia.  Psychiatric/Behavioral: Positive for memory loss. Negative for suicidal ideas and hallucinations. The patient is nervous/anxious.        More confused    Past Medical History  Diagnosis Date  . Diabetes mellitus   .  Hypertension   . Heart murmur   . Benign familial tremor   . Rosacea   . Diverticulitis   . Heart failure     diastolic heart failure  . TIA (transient ischemic attack) 2010  . Arthritis   . Breast cancer ([place])     breast -rt  . CAD (coronary artery disease)     single vessel CAD per cath in 2006; scattered nonobstructive disease in the left system, left to right collaterals to the RCA which was occluded by flush shots; she has been managed medically  . Polycythemia 06/05/2015    Normal erythropoietin; O2 sat 95% room air     Patient Active Problem List   Diagnosis Date Noted  . Liver enzyme elevation 09/04/2015  . GERD (gastroesophageal reflux disease) 08/25/2015  . Blisters of multiple sites 08/07/2015  . Traumatic hematoma of left knee 08/07/2015  . Polycythemia 06/05/2015  . Erythrocytosis 05/01/2015  . Controlled type 2 DM with peripheral circulatory disorder ([place]) 11/04/2014  . Herpes zoster 09/30/2014  . Right-sided chest wall pain 09/30/2014  . Plantar fasciitis of left foot 07/18/2014  . Fecal incontinence 06/10/2014  . Left leg pain 04/18/2014  . Other malaise and fatigue 04/18/2014  . UTI (urinary tract infection) 02/09/2014  . Venous stasis dermatitis of both lower extremities 12/10/2013  . Candidiasis of skin 09/10/2013  . Dementia with behavioral disturbance 08/23/2013  . Right hip pain 08/13/2013  . PAF (paroxysmal atrial fibrillation) ([place]) 07/26/2013  . A-fib ([place]) 07/23/2013  . Edema 06/27/2013  . Ulcer of foot due to diabetes mellitus ([place])  06/27/2013  . Pressure ulcer stage II 06/18/2013  . Falls frequently 04/23/2013  . DNR (do not resuscitate) 04/23/2013  . Type 2 diabetes mellitus with diabetic neuropathy affecting both sides of body ([place]) 02/26/2013  . Hypothyroidism 02/26/2013  . Depression 02/26/2013  . Essential tremor 02/26/2013  . Back pain 02/26/2013  . HTN (hypertension) 02/26/2013  . Constipation 02/26/2013  . Ischemic heart disease  10/02/2012  . Leg swelling 10/02/2012  . Dyspepsia 10/02/2012  . Diabetic foot ulcer ([place]) 07/15/2011  . 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 Penicillins Other (See Comments)    Per MAR  . Bee Venom Other (See Comments)    Per MAR  . Celebrex [Celecoxib] Other (See Comments)    Per MAR  . Lidocaine Hcl Other (See Comments)    Per MAR  . Phenobarbital Other (See Comments)    Per MAR  . Procaine Hcl Other (See Comments)    Per mar    Medications: Patient's Medications  New Prescriptions   No medications on file  Previous Medications   ACETAMINOPHEN (TYLENOL) 325 MG TABLET    Take 2 tablets (650 mg total) by mouth every 4 (four) hours as needed.   ALUM & MAG HYDROXIDE-SIMETH (MAALOX/MYLANTA) 200-200-20 MG/5ML SUSPENSION    Take 30 mLs by mouth every 6 (six) hours as needed for indigestion or heartburn.   ASPIRIN EC 81 MG TABLET    Take 81 mg by mouth daily.   AYR SALINE NASAL NO-DRIP GEL    Place into the nose 2 (two) times daily as needed (nose bleeds).   DOCUSATE SODIUM (COLACE) 100 MG CAPSULE    Take 200 mg by mouth at bedtime.    DORZOLAMIDE-TIMOLOL (COSOPT) 22.3-6.8 MG/ML OPHTHALMIC SOLUTION    Place 1 drop into the left eye every 12 (twelve) hours.   DULOXETINE (CYMBALTA) 30 MG CAPSULE    Take 30 mg by mouth 2 (two) times daily.   FENTANYL (DURAGESIC - DOSED MCG/HR) 100 MCG/HR    Apply one patch topically every 3 days for pain. Remove old patch before applying new patch.   FUROSEMIDE (LASIX) 40 MG TABLET    Take 40 mg by mouth 2 (two) times daily.    GLUCAGON (GLUCAGEN) 1 MG SOLR INJECTION    Inject 1 mg into the vein once as needed for low blood sugar.   GLUCOSAMINE-CHONDROITIN 500-400 MG TABLET    Take 1 tablet by mouth 3 (three) times daily.   GUAIFENESIN (ROBITUSSIN) 100 MG/5ML SYRUP    Take 300 mg by mouth 3 (three) times daily as needed for cough.   HYDROXYZINE (ATARAX/VISTARIL) 25 MG TABLET    Take 25 mg by mouth at  bedtime as needed. For itching   INSULIN ASPART (NOVOLOG) 100 UNIT/ML INJECTION    Inject 5 Units into the skin 3 (three) times daily with meals. 8 units If CBG is greater than 150 at lunch and 12 units if CBG is greater than 150 at breakfast and dinner   INSULIN GLARGINE (LANTUS) 100 UNIT/ML INJECTION    Inject 18 Units into the skin at bedtime.    ISOSORBIDE MONONITRATE (IMDUR) 60 MG 24 HR TABLET    Take 60 mg by mouth every morning.    LEVOTHYROXINE (SYNTHROID, LEVOTHROID) 75 MCG TABLET    Take 75-112.5 mcg by mouth daily. Take 1 and 1/2 tablets on Saturdays then 1 tablet all other days   LINZESS 145 MCG CAPS CAPSULE  Take 145 mcg by mouth daily. Take 30 mins prior to breakfast on an empty stomach.   LORATADINE (CLARITIN) 10 MG TABLET    Take 10 mg by mouth daily.    LOSARTAN (COZAAR) 25 MG TABLET    Take 25 mg by mouth daily.   LUBIPROSTONE (AMITIZA) 24 MCG CAPSULE    Take 24 mcg by mouth 2 (two) times daily with a meal.     MEMANTINE HCL ER (NAMENDA XR) 28 MG CP24    Take by mouth.   METHYLCELLULOSE (ARTIFICIAL TEARS) 1 % OPHTHALMIC SOLUTION    Place 1 drop into both eyes 4 (four) times daily as needed (for dryness).   MULTIPLE VITAMINS-MINERALS (CERTAVITE SENIOR/ANTIOXIDANT PO)    Take 1 tablet by mouth daily.   NITROGLYCERIN (NITROSTAT) 0.4 MG SL TABLET    Place 0.4 mg under the tongue every 5 (five) minutes as needed. For chest pain   NUTRITIONAL SUPPLEMENTS (RESOURCE 2.0 PO)    Take 120 mLs by mouth 3 (three) times daily.   OMEPRAZOLE (PRILOSEC) 20 MG CAPSULE    Take 20 mg by mouth daily.    PROBIOTIC PRODUCT (ALIGN) 4 MG CAPS    Take 1 capsule by mouth daily.   PROPRANOLOL (INDERAL) 20 MG TABLET    Take 20 mg by mouth 2 (two) times daily.   SPIRONOLACTONE (ALDACTONE) 25 MG TABLET    Take 25 mg by mouth daily.    VITAMIN D, ERGOCALCIFEROL, (DRISDOL) 50000 UNITS CAPS    Take 50,000 Units by mouth every 30 (thirty) days. On the first of the month  Modified Medications   No medications on  file  Discontinued Medications   No medications on file    Physical Exam: Filed Vitals:   10/31/15 2232  BP: 120/80  Pulse: 68  Temp: 97.6 F (36.4 C)  TempSrc: Tympanic  Resp: 16   There is no weight on file to calculate BMI.  Physical Exam  Constitutional: She appears well-developed.  HENT:  Head: Normocephalic and atraumatic.  Left Ear: External ear normal.  Eyes: Conjunctivae and EOM are normal. Pupils are equal, round, and reactive to light.  Neck: Neck supple. No JVD present. No thyromegaly present.  Cardiovascular: Normal rate and regular rhythm.   Murmur heard. Pulmonary/Chest: Effort normal. She has rales.  Bibasilar rales.   Abdominal: Soft. There is no tenderness. There is no rebound.  Musculoskeletal: She exhibits edema and tenderness.  Back pain is chronic which is well controlled. BLE chronic edema trace only. Left foot external deviated deformity.   Lymphadenopathy:    She has no cervical adenopathy.  Neurological: She is alert. No cranial nerve deficit. Coordination normal.  Skin: Skin is warm and dry. Rash noted.  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 R+L   Small open areas lateral left lower leg, anterior right lower leg. Pressure ulcers at the R+L intergluteal cleft, stage II, healthy wound beds, small amount of bleeding, no s/s of infection.    Psychiatric: Her mood appears anxious. Her affect is not blunt, not labile and not inappropriate. Her speech is rapid and/or pressured (occasionally ). Her speech is not delayed and not slurred. She is hyperactive. She is not agitated, not aggressive, not slowed, not withdrawn, not actively hallucinating and not combative. Thought content is not paranoid and not delusional. She does not exhibit a depressed mood. She exhibits abnormal recent memory.  MMSE 21/30 08/2013    Labs reviewed: Basic Metabolic Panel:  Recent Labs  04/27/15 08/27/15 10/29/15  NA 137 137 140  K 3.7 4.1 3.8  BUN 28* 36* 35*  CREATININE 0.9  1.2* 1.0    Liver Function Tests:  Recent Labs  04/27/15 08/27/15 10/29/15  AST 17 88* 24  ALT 14 100* 15  ALKPHOS 101 120 79    CBC:  Recent Labs  06/02/15 1455 08/27/15 10/29/15  WBC 7.7 9.3 6.7  NEUTROABS 5.2  --   --   HGB 16.5* 17.8* 15.2  HCT 48.9* 50* 45  MCV 98.2  --   --   PLT 216 218 171    Lab Results  Component Value Date   TSH 2.37 08/27/2015   Lab Results  Component Value Date   HGBA1C 8.1 10/29/2015   Lab Results  Component Value Date   CHOL  10/22/2010    100        ATP III CLASSIFICATION:  <200     mg/dL   Desirable  200-239  mg/dL   Borderline High  >=240    mg/dL   High          HDL 55 10/22/2010   LDLCALC  10/22/2010    29        Total Cholesterol/HDL:CHD Risk Coronary Heart Disease Risk Table                     Men   Women  1/2 Average Risk   3.4   3.3  Average Risk       5.0   4.4  2 X Average Risk   9.6   7.1  3 X Average Risk  23.4   11.0        Use the calculated Patient Ratio above and the CHD Risk Table to determine the patient's CHD Risk.        ATP III CLASSIFICATION (LDL):  <100     mg/dL   Optimal  100-129  mg/dL   Near or Above                    Optimal  130-159  mg/dL   Borderline  160-189  mg/dL   High  >190     mg/dL   Very High   TRIG 80 10/22/2010   CHOLHDL 1.8 10/22/2010    Significant Diagnostic Results since last visit: none  Patient Care Team: Reynold Bowen, MD as PCP - General (Endocrinology) Darlin Coco, MD (Cardiology) Arloa Koh, MD (Radiation Oncology) Neldon Mc, MD (General Surgery)  Assessment/Plan Problem List Items Addressed This Visit    A-fib [facility]) (Chronic)    02/03/14 doing well, EKG A-fib with controlled VR.  Heart rate is in control, continue Atenolol.      Back pain    Chronic issue, pain is controlled, continue Fentanyl 142mcg/hr      Constipation    Stable, continue colace 100 II qhs, Linzess 135mcg, MiraLax prn      Controlled type 2 DM with peripheral  circulatory disorder (HCC) (Chronic)    10/29/15 Hgb A1c 8.1, glucose 43. Decrease Lantus to 14 u, increase Novolog 10u with meals CBG>100.       Dementia with behavioral disturbance (Chronic)    Gradual decline. Continue Namenda 28mg  daily to preserve memory. Increased confusion w/o new focal neurological symptoms      Depression    Stable, continue Cymbalta 30mg  since 06/29/15      Diabetic foot ulcer ([place])    healed      Edema (Chronic)  Chronic venous insufficiency. Continue Furosemide 40mg  bid, Spironolctone25mg  daily. Trace edema BLE      Essential tremor    Mild head titubation and hands tremor-not disabling, continue Propranolol 10mg  bid      GERD (gastroesophageal reflux disease)    Asymptomatic, continue Pepcid 20mg       HTN (hypertension) (Chronic)    Controlled, continue Losartan 25mg  daily, Atenolol 20mg  bid, Furosemide 40mg  bid, Spironolactone 25mg , Imdur 60mg       Hypothyroidism (Chronic)    Continue Levothyroxine 112.32mcg Sat, 57mcg M, T, W, Thr, F, and Sun, last TSH 08/27/15 2.365      Polycythemia    Normal erythropoietin; O2 sat 95% room air 10/29/15 Hgb 15.2, Red 4.62      Pressure ulcer stage II    Improving.       Type 2 diabetes mellitus with diabetic neuropathy affecting both sides of body (HCC) (Chronic)    10/29/15 Hgb A1c 8.1      UTI (urinary tract infection) - Primary (Chronic)    10/30/15 urine culture E. Coli Nitrofurantoin 100mg  bid x 10days.           Family/ staff Communication: continue SNF for care needs,  Labs/tests ordered:  CBC, CMP, Hgb A1c, UA C/S done 10/29/15  [facility] Xion Debruyne NP Geriatrics [facility] [address], [place] [address] On Call:  [phone] & follow prompts after 5pm & weekends Office Phone:  [phone] Office Fax:  [phone]

## 2015-11-03 ENCOUNTER — Encounter: Payer: Self-pay | Admitting: Nurse Practitioner

## 2015-11-03 NOTE — Assessment & Plan Note (Signed)
10/29/15 Hgb A1c 8.1, glucose 43. Decrease Lantus to 14 u, increase Novolog 10u with meals CBG>100.

## 2015-11-03 NOTE — Assessment & Plan Note (Signed)
02/03/14 doing well, EKG A-fib with controlled VR.  Heart rate is in control, continue Atenolol.  

## 2015-11-03 NOTE — Assessment & Plan Note (Signed)
Gradual decline. Continue Namenda 28mg  daily to preserve memory. Increased confusion w/o new focal neurological symptoms

## 2015-11-03 NOTE — Assessment & Plan Note (Signed)
Chronic issue, pain is controlled, continue Fentanyl 100mcg/hr 

## 2015-11-03 NOTE — Assessment & Plan Note (Signed)
Stable, continue Cymbalta 30mg since 06/29/15. 

## 2015-11-03 NOTE — Assessment & Plan Note (Signed)
Mild head titubation and hands tremor-not disabling, continue Propranolol 10mg bid 

## 2015-11-03 NOTE — Assessment & Plan Note (Signed)
Normal erythropoietin; O2 sat 95% room air 10/29/15 Hgb 15.2, Red 4.62

## 2015-11-03 NOTE — Assessment & Plan Note (Signed)
Continue Levothyroxine 112.5mcg Sat, 75mcg M, T, W, Thr, F, and Sun, last TSH 08/27/15 2.365 

## 2015-11-03 NOTE — Assessment & Plan Note (Signed)
10/30/15 urine culture E. Coli Nitrofurantoin 100mg  bid x 10days.

## 2015-11-03 NOTE — Assessment & Plan Note (Signed)
Chronic venous insufficiency. Continue Furosemide 40mg bid, Spironolctone25mg daily. Trace edema BLE.   

## 2015-11-03 NOTE — Assessment & Plan Note (Signed)
Improving.

## 2015-11-03 NOTE — Assessment & Plan Note (Signed)
10/29/15 Hgb A1c 8.1

## 2015-11-03 NOTE — Assessment & Plan Note (Signed)
Asymptomatic, continue Pepcid 20mg  

## 2015-11-03 NOTE — Assessment & Plan Note (Signed)
Stable, continue colace 100 II qhs, Linzess 145mcg, MiraLax prn. 

## 2015-11-03 NOTE — Assessment & Plan Note (Signed)
Controlled, continue Losartan 25mg daily, Atenolol 20mg bid, Furosemide 40mg bid, Spironolactone 25mg, Imdur 60mg 

## 2015-11-03 NOTE — Assessment & Plan Note (Signed)
healed 

## 2015-11-24 ENCOUNTER — Non-Acute Institutional Stay (SKILLED_NURSING_FACILITY): Payer: Medicare Other | Admitting: Nurse Practitioner

## 2015-11-24 ENCOUNTER — Encounter: Payer: Self-pay | Admitting: Nurse Practitioner

## 2015-11-24 DIAGNOSIS — E039 Hypothyroidism, unspecified: Secondary | ICD-10-CM | POA: Diagnosis not present

## 2015-11-24 DIAGNOSIS — I480 Paroxysmal atrial fibrillation: Secondary | ICD-10-CM

## 2015-11-24 DIAGNOSIS — E11621 Type 2 diabetes mellitus with foot ulcer: Secondary | ICD-10-CM | POA: Diagnosis not present

## 2015-11-24 DIAGNOSIS — L97509 Non-pressure chronic ulcer of other part of unspecified foot with unspecified severity: Secondary | ICD-10-CM

## 2015-11-24 DIAGNOSIS — K219 Gastro-esophageal reflux disease without esophagitis: Secondary | ICD-10-CM

## 2015-11-24 DIAGNOSIS — N390 Urinary tract infection, site not specified: Secondary | ICD-10-CM | POA: Diagnosis not present

## 2015-11-24 DIAGNOSIS — R609 Edema, unspecified: Secondary | ICD-10-CM

## 2015-11-24 DIAGNOSIS — I10 Essential (primary) hypertension: Secondary | ICD-10-CM | POA: Diagnosis not present

## 2015-11-24 DIAGNOSIS — E1151 Type 2 diabetes mellitus with diabetic peripheral angiopathy without gangrene: Secondary | ICD-10-CM | POA: Diagnosis not present

## 2015-11-24 DIAGNOSIS — F329 Major depressive disorder, single episode, unspecified: Secondary | ICD-10-CM | POA: Diagnosis not present

## 2015-11-24 DIAGNOSIS — F0391 Unspecified dementia with behavioral disturbance: Secondary | ICD-10-CM

## 2015-11-24 DIAGNOSIS — M5440 Lumbago with sciatica, unspecified side: Secondary | ICD-10-CM

## 2015-11-24 DIAGNOSIS — D751 Secondary polycythemia: Secondary | ICD-10-CM | POA: Diagnosis not present

## 2015-11-24 DIAGNOSIS — F32A Depression, unspecified: Secondary | ICD-10-CM

## 2015-11-24 DIAGNOSIS — F03918 Unspecified dementia, unspecified severity, with other behavioral disturbance: Secondary | ICD-10-CM

## 2015-11-24 DIAGNOSIS — G250 Essential tremor: Secondary | ICD-10-CM | POA: Diagnosis not present

## 2015-11-24 DIAGNOSIS — E1142 Type 2 diabetes mellitus with diabetic polyneuropathy: Secondary | ICD-10-CM | POA: Diagnosis not present

## 2015-11-24 DIAGNOSIS — K5900 Constipation, unspecified: Secondary | ICD-10-CM

## 2015-11-24 NOTE — Assessment & Plan Note (Signed)
Stable, continue colace 100 II qhs, Linzess 145mcg, MiraLax prn. 

## 2015-11-24 NOTE — Assessment & Plan Note (Signed)
Chronic issue, pain is controlled, continue Fentanyl 100mcg/hr 

## 2015-11-24 NOTE — Assessment & Plan Note (Signed)
Gradual decline. Continue Namenda 28mg  daily to preserve memory. Increased confusion w/o new focal neurological symptoms

## 2015-11-24 NOTE — Assessment & Plan Note (Signed)
Asymptomatic, continue Pepcid 20mg  

## 2015-11-24 NOTE — Progress Notes (Signed)
Patient ID: [name], female   DOB: 26-Sep-1926, 82 y.o.   MRN: [id number]  Location:  SNF [facility] Provider:  Marlana Latus NP  Code Status:  DNR Goals of care: Advanced Directive information    Chief Complaint  Patient presents with  . Medical Management of Chronic Issues     HPI: Patient is a 82 y.o. female seen in the SNF at [facility] today for evaluation of HTN, depression, dementia, blood sugar,  pressure ulcers at the R+L intergluteal cleft, stage II, slow healing.   Review of Systems  Constitutional: Negative for fever, chills and diaphoresis.       C/o aches allover  HENT: Positive for hearing loss. Negative for congestion, ear discharge and ear pain.   Eyes: Negative for pain, discharge and redness.  Respiratory: Negative for cough, shortness of breath and wheezing.   Cardiovascular: Positive for leg swelling. Negative for chest pain and palpitations.  Gastrointestinal: Negative for nausea, vomiting, abdominal pain and constipation.  Genitourinary: Positive for frequency. Negative for dysuria and urgency.       Recurrent urinary tract infections  Musculoskeletal: Positive for back pain. Negative for myalgias and neck pain.  Skin: Negative for rash.       Long standing excoriated buttocks with pressure ulcers L+R buttocks, pressure ulcers at the R+L intergluteal cleft, stage II, healthy wound beds, small amount of bleeding, no s/s of infection.    Neurological: Negative for dizziness, tremors, seizures, weakness and headaches.  Endo/Heme/Allergies: Negative for polydipsia.  Psychiatric/Behavioral: Positive for memory loss. Negative for suicidal ideas and hallucinations. The patient is nervous/anxious.        More confused    Past Medical History  Diagnosis Date  . Diabetes mellitus   . Hypertension   . Heart murmur   . Benign familial tremor   . Rosacea   . Diverticulitis   . Heart failure     diastolic heart failure  . TIA (transient ischemic attack) 2010  .  Arthritis   . Breast cancer ([place])     breast -rt  . CAD (coronary artery disease)     single vessel CAD per cath in 2006; scattered nonobstructive disease in the left system, left to right collaterals to the RCA which was occluded by flush shots; she has been managed medically  . Polycythemia 06/05/2015    Normal erythropoietin; O2 sat 95% room air     Patient Active Problem List   Diagnosis Date Noted  . Liver enzyme elevation 09/04/2015  . GERD (gastroesophageal reflux disease) 08/25/2015  . Blisters of multiple sites 08/07/2015  . Traumatic hematoma of left knee 08/07/2015  . Polycythemia 06/05/2015  . Erythrocytosis 05/01/2015  . Controlled type 2 DM with peripheral circulatory disorder ([place]) 11/04/2014  . Herpes zoster 09/30/2014  . Right-sided chest wall pain 09/30/2014  . Plantar fasciitis of left foot 07/18/2014  . Fecal incontinence 06/10/2014  . Left leg pain 04/18/2014  . Other malaise and fatigue 04/18/2014  . UTI (urinary tract infection) 02/09/2014  . Venous stasis dermatitis of both lower extremities 12/10/2013  . Candidiasis of skin 09/10/2013  . Dementia with behavioral disturbance 08/23/2013  . Right hip pain 08/13/2013  . PAF (paroxysmal atrial fibrillation) ([place]) 07/26/2013  . A-fib ([place]) 07/23/2013  . Edema 06/27/2013  . Ulcer of foot due to diabetes mellitus ([place]) 06/27/2013  . Pressure ulcer stage II 06/18/2013  . Falls frequently 04/23/2013  . DNR (do not resuscitate) 04/23/2013  . Type 2 diabetes mellitus with  diabetic neuropathy affecting both sides of body ([place]) 02/26/2013  . Hypothyroidism 02/26/2013  . Depression 02/26/2013  . Essential tremor 02/26/2013  . Back pain 02/26/2013  . HTN (hypertension) 02/26/2013  . Constipation 02/26/2013  . Ischemic heart disease 10/02/2012  . Leg swelling 10/02/2012  . Dyspepsia 10/02/2012  . Diabetic foot ulcer ([place]) 07/15/2011  . 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 Penicillins Other (See Comments)    Per MAR  . Bee Venom Other (See Comments)    Per MAR  . Celebrex [Celecoxib] Other (See Comments)    Per MAR  . Lidocaine Hcl Other (See Comments)    Per MAR  . Phenobarbital Other (See Comments)    Per MAR  . Procaine Hcl Other (See Comments)    Per mar    Medications: Patient's Medications  New Prescriptions   No medications on file  Previous Medications   ACETAMINOPHEN (TYLENOL) 325 MG TABLET    Take 2 tablets (650 mg total) by mouth every 4 (four) hours as needed.   ALUM & MAG HYDROXIDE-SIMETH (MAALOX/MYLANTA) 200-200-20 MG/5ML SUSPENSION    Take 30 mLs by mouth every 6 (six) hours as needed for indigestion or heartburn.   ASPIRIN EC 81 MG TABLET    Take 81 mg by mouth daily.   AYR SALINE NASAL NO-DRIP GEL    Place into the nose 2 (two) times daily as needed (nose bleeds).   DOCUSATE SODIUM (COLACE) 100 MG CAPSULE    Take 200 mg by mouth at bedtime.    DORZOLAMIDE-TIMOLOL (COSOPT) 22.3-6.8 MG/ML OPHTHALMIC SOLUTION    Place 1 drop into the left eye every 12 (twelve) hours.   DULOXETINE (CYMBALTA) 30 MG CAPSULE    Take 30 mg by mouth 2 (two) times daily.   FENTANYL (DURAGESIC - DOSED MCG/HR) 100 MCG/HR    Apply one patch topically every 3 days for pain. Remove old patch before applying new patch.   FUROSEMIDE (LASIX) 40 MG TABLET    Take 40 mg by mouth 2 (two) times daily.    GLUCAGON (GLUCAGEN) 1 MG SOLR INJECTION    Inject 1 mg into the vein once as needed for low blood sugar.   GLUCOSAMINE-CHONDROITIN 500-400 MG TABLET    Take 1 tablet by mouth 3 (three) times daily.   GUAIFENESIN (ROBITUSSIN) 100 MG/5ML SYRUP    Take 300 mg by mouth 3 (three) times daily as needed for cough.   HYDROXYZINE (ATARAX/VISTARIL) 25 MG TABLET    Take 25 mg by mouth at bedtime as needed. For itching   INSULIN ASPART (NOVOLOG) 100 UNIT/ML INJECTION    Inject 5 Units into the skin 3 (three) times daily with meals. 8 units If CBG is greater than 150 at  lunch and 12 units if CBG is greater than 150 at breakfast and dinner   INSULIN GLARGINE (LANTUS) 100 UNIT/ML INJECTION    Inject 18 Units into the skin at bedtime.    ISOSORBIDE MONONITRATE (IMDUR) 60 MG 24 HR TABLET    Take 60 mg by mouth every morning.    LEVOTHYROXINE (SYNTHROID, LEVOTHROID) 75 MCG TABLET    Take 75-112.5 mcg by mouth daily. Take 1 and 1/2 tablets on Saturdays then 1 tablet all other days   LINZESS 145 MCG CAPS CAPSULE    Take 145 mcg by mouth daily. Take 30 mins prior to breakfast on an empty stomach.   LORATADINE (CLARITIN) 10 MG TABLET  Take 10 mg by mouth daily.    LOSARTAN (COZAAR) 25 MG TABLET    Take 25 mg by mouth daily.   LUBIPROSTONE (AMITIZA) 24 MCG CAPSULE    Take 24 mcg by mouth 2 (two) times daily with a meal.     MEMANTINE HCL ER (NAMENDA XR) 28 MG CP24    Take by mouth.   METHYLCELLULOSE (ARTIFICIAL TEARS) 1 % OPHTHALMIC SOLUTION    Place 1 drop into both eyes 4 (four) times daily as needed (for dryness).   MULTIPLE VITAMINS-MINERALS (CERTAVITE SENIOR/ANTIOXIDANT PO)    Take 1 tablet by mouth daily.   NITROGLYCERIN (NITROSTAT) 0.4 MG SL TABLET    Place 0.4 mg under the tongue every 5 (five) minutes as needed. For chest pain   NUTRITIONAL SUPPLEMENTS (RESOURCE 2.0 PO)    Take 120 mLs by mouth 3 (three) times daily.   OMEPRAZOLE (PRILOSEC) 20 MG CAPSULE    Take 20 mg by mouth daily.    PROBIOTIC PRODUCT (ALIGN) 4 MG CAPS    Take 1 capsule by mouth daily.   PROPRANOLOL (INDERAL) 20 MG TABLET    Take 20 mg by mouth 2 (two) times daily.   SPIRONOLACTONE (ALDACTONE) 25 MG TABLET    Take 25 mg by mouth daily.    VITAMIN D, ERGOCALCIFEROL, (DRISDOL) 50000 UNITS CAPS    Take 50,000 Units by mouth every 30 (thirty) days. On the first of the month  Modified Medications   No medications on file  Discontinued Medications   No medications on file    Physical Exam: Filed Vitals:   11/24/15 1652  BP: 148/70  Pulse: 66  Temp: 97.4 F (36.3 C)  TempSrc: Tympanic    Resp: 20   There is no weight on file to calculate BMI.  Physical Exam  Constitutional: She appears well-developed.  HENT:  Head: Normocephalic and atraumatic.  Left Ear: External ear normal.  Eyes: Conjunctivae and EOM are normal. Pupils are equal, round, and reactive to light.  Neck: Neck supple. No JVD present. No thyromegaly present.  Cardiovascular: Normal rate and regular rhythm.   Murmur heard. Pulmonary/Chest: Effort normal. She has rales.  Bibasilar rales.   Abdominal: Soft. There is no tenderness. There is no rebound.  Musculoskeletal: She exhibits edema and tenderness.  Back pain is chronic which is well controlled. BLE chronic edema trace only. Left foot external deviated deformity.   Lymphadenopathy:    She has no cervical adenopathy.  Neurological: She is alert. No cranial nerve deficit. Coordination normal.  Skin: Skin is warm and dry. Rash noted.  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 R+L   Small open areas lateral left lower leg, anterior right lower leg. Pressure ulcers at the R+L intergluteal cleft, stage II, healthy wound beds, small amount of bleeding, no s/s of infection.    Psychiatric: Her mood appears anxious. Her affect is not blunt, not labile and not inappropriate. Her speech is rapid and/or pressured (occasionally ). Her speech is not delayed and not slurred. She is hyperactive. She is not agitated, not aggressive, not slowed, not withdrawn, not actively hallucinating and not combative. Thought content is not paranoid and not delusional. She does not exhibit a depressed mood. She exhibits abnormal recent memory.  MMSE 21/30 08/2013    Labs reviewed: Basic Metabolic Panel:  Recent Labs  04/27/15 08/27/15 10/29/15  NA 137 137 140  K 3.7 4.1 3.8  BUN 28* 36* 35*  CREATININE 0.9 1.2* 1.0    Liver Function Tests:  Recent Labs  04/27/15 08/27/15 10/29/15  AST 17 88* 24  ALT 14 100* 15  ALKPHOS 101 120 79    CBC:  Recent Labs  06/02/15 1455  08/27/15 10/29/15  WBC 7.7 9.3 6.7  NEUTROABS 5.2  --   --   HGB 16.5* 17.8* 15.2  HCT 48.9* 50* 45  MCV 98.2  --   --   PLT 216 218 171    Lab Results  Component Value Date   TSH 2.37 08/27/2015   Lab Results  Component Value Date   HGBA1C 8.1 10/29/2015   Lab Results  Component Value Date   CHOL  10/22/2010    100        ATP III CLASSIFICATION:  <200     mg/dL   Desirable  200-239  mg/dL   Borderline High  >=240    mg/dL   High          HDL 55 10/22/2010   LDLCALC  10/22/2010    29        Total Cholesterol/HDL:CHD Risk Coronary Heart Disease Risk Table                     Men   Women  1/2 Average Risk   3.4   3.3  Average Risk       5.0   4.4  2 X Average Risk   9.6   7.1  3 X Average Risk  23.4   11.0        Use the calculated Patient Ratio above and the CHD Risk Table to determine the patient's CHD Risk.        ATP III CLASSIFICATION (LDL):  <100     mg/dL   Optimal  100-129  mg/dL   Near or Above                    Optimal  130-159  mg/dL   Borderline  160-189  mg/dL   High  >190     mg/dL   Very High   TRIG 80 10/22/2010   CHOLHDL 1.8 10/22/2010    Significant Diagnostic Results since last visit: none  Patient Care Team: Reynold Bowen, MD as PCP - General (Endocrinology) Darlin Coco, MD (Cardiology) Arloa Koh, MD (Radiation Oncology) Neldon Mc, MD (General Surgery)  Assessment/Plan Problem List Items Addressed This Visit    UTI (urinary tract infection) (Chronic)    10/30/15 urine culture E. Coli Nitrofurantoin 100mg  bid x 10days.  Fully treated, she is in her usual state of health.       Type 2 diabetes mellitus with diabetic neuropathy affecting both sides of body (HCC) (Chronic)    10/29/15 Hgb A1c 8.1 Continue Lantus 14u daily, Novolog 10u with meals for CBG>100      Polycythemia    Normal erythropoietin; O2 sat 95% room air 10/29/15 Hgb 15.2, Red 4.62      Hypothyroidism (Chronic)    Continue Levothyroxine  112.93mcg Sat, 24mcg M, T, W, Thr, F, and Sun, last TSH 08/27/15 2.365      HTN (hypertension) - Primary (Chronic)    Controlled, continue Losartan 25mg  daily, Atenolol 20mg  bid, Furosemide 40mg  bid, Spironolactone 25mg , Imdur 60mg       GERD (gastroesophageal reflux disease)    Asymptomatic, continue Pepcid 20mg       Essential tremor    Mild head titubation and hands tremor-not disabling, continue Propranolol 10mg  bid      Edema (Chronic)  Chronic venous insufficiency. Continue Furosemide 40mg  bid, Spironolctone25mg  daily. Trace edema BLE      Diabetic foot ulcer (HCC)    Healed.       Depression    Stable, continue Cymbalta 30mg  since 06/29/15      Dementia with behavioral disturbance (Chronic)    Gradual decline. Continue Namenda 28mg  daily to preserve memory. Increased confusion w/o new focal neurological symptoms      Controlled type 2 DM with peripheral circulatory disorder (HCC) (Chronic)    10/29/15 Hgb A1c 8.1. Chronic CAD, PVD      Constipation    Stable, continue colace 100 II qhs, Linzess 144mcg, MiraLax prn      Back pain    Chronic issue, pain is controlled, continue Fentanyl 162mcg/hr      A-fib (HCC) (Chronic)    02/03/14 doing well, EKG A-fib with controlled VR.  Heart rate is in control, continue Atenolol.          Family/ staff Communication: continue SNF for care needs,  Labs/tests ordered:  CBC, CMP, Hgb A1c, UA C/S done 10/29/15  [facility] NP Geriatrics [facility] [address], [place] [address] On Call:  [phone] & follow prompts after 5pm & weekends Office Phone:  [phone] Office Fax:  [phone]

## 2015-11-24 NOTE — Assessment & Plan Note (Signed)
Normal erythropoietin; O2 sat 95% room air 10/29/15 Hgb 15.2, Red 4.62

## 2015-11-24 NOTE — Assessment & Plan Note (Signed)
Continue Levothyroxine 112.5mcg Sat, 75mcg M, T, W, Thr, F, and Sun, last TSH 08/27/15 2.365 

## 2015-11-24 NOTE — Assessment & Plan Note (Signed)
10/29/15 Hgb A1c 8.1 Continue Lantus 14u daily, Novolog 10u with meals for CBG>100

## 2015-11-24 NOTE — Assessment & Plan Note (Signed)
Mild head titubation and hands tremor-not disabling, continue Propranolol 10mg bid 

## 2015-11-24 NOTE — Assessment & Plan Note (Signed)
Healed

## 2015-11-24 NOTE — Assessment & Plan Note (Signed)
10/29/15 Hgb A1c 8.1. Chronic CAD, PVD

## 2015-11-24 NOTE — Assessment & Plan Note (Signed)
02/03/14 doing well, EKG A-fib with controlled VR.  Heart rate is in control, continue Atenolol.  

## 2015-11-24 NOTE — Assessment & Plan Note (Signed)
Chronic venous insufficiency. Continue Furosemide 40mg bid, Spironolctone25mg daily. Trace edema BLE.   

## 2015-11-24 NOTE — Assessment & Plan Note (Signed)
10/30/15 urine culture E. Coli Nitrofurantoin 100mg  bid x 10days.  Fully treated, she is in her usual state of health.

## 2015-11-24 NOTE — Assessment & Plan Note (Signed)
Controlled, continue Losartan 25mg daily, Atenolol 20mg bid, Furosemide 40mg bid, Spironolactone 25mg, Imdur 60mg 

## 2015-11-24 NOTE — Assessment & Plan Note (Signed)
Stable, continue Cymbalta 30mg since 06/29/15. 

## 2015-12-25 ENCOUNTER — Encounter: Payer: Self-pay | Admitting: Nurse Practitioner

## 2015-12-25 ENCOUNTER — Non-Acute Institutional Stay (SKILLED_NURSING_FACILITY): Payer: Medicare Other | Admitting: Nurse Practitioner

## 2015-12-25 DIAGNOSIS — I4891 Unspecified atrial fibrillation: Secondary | ICD-10-CM | POA: Diagnosis not present

## 2015-12-25 DIAGNOSIS — E1142 Type 2 diabetes mellitus with diabetic polyneuropathy: Secondary | ICD-10-CM

## 2015-12-25 DIAGNOSIS — M5440 Lumbago with sciatica, unspecified side: Secondary | ICD-10-CM | POA: Diagnosis not present

## 2015-12-25 DIAGNOSIS — K219 Gastro-esophageal reflux disease without esophagitis: Secondary | ICD-10-CM | POA: Diagnosis not present

## 2015-12-25 DIAGNOSIS — F03918 Unspecified dementia, unspecified severity, with other behavioral disturbance: Secondary | ICD-10-CM

## 2015-12-25 DIAGNOSIS — F0391 Unspecified dementia with behavioral disturbance: Secondary | ICD-10-CM

## 2015-12-25 DIAGNOSIS — G250 Essential tremor: Secondary | ICD-10-CM

## 2015-12-25 DIAGNOSIS — I10 Essential (primary) hypertension: Secondary | ICD-10-CM

## 2015-12-25 DIAGNOSIS — F32A Depression, unspecified: Secondary | ICD-10-CM

## 2015-12-25 DIAGNOSIS — R609 Edema, unspecified: Secondary | ICD-10-CM

## 2015-12-25 DIAGNOSIS — K5900 Constipation, unspecified: Secondary | ICD-10-CM

## 2015-12-25 DIAGNOSIS — E1151 Type 2 diabetes mellitus with diabetic peripheral angiopathy without gangrene: Secondary | ICD-10-CM | POA: Diagnosis not present

## 2015-12-25 DIAGNOSIS — F329 Major depressive disorder, single episode, unspecified: Secondary | ICD-10-CM | POA: Diagnosis not present

## 2015-12-25 DIAGNOSIS — E039 Hypothyroidism, unspecified: Secondary | ICD-10-CM | POA: Diagnosis not present

## 2015-12-25 NOTE — Assessment & Plan Note (Signed)
Stable, continue colace 100 II qhs, Linzess 145mcg, MiraLax prn. 

## 2015-12-25 NOTE — Assessment & Plan Note (Signed)
02/03/14 doing well, EKG A-fib with controlled VR.  Heart rate is in control, continue Atenolol.  

## 2015-12-25 NOTE — Assessment & Plan Note (Signed)
Controlled, continue Losartan 25mg daily, Atenolol 20mg bid, Furosemide 40mg bid, Spironolactone 25mg, Imdur 60mg 

## 2015-12-25 NOTE — Assessment & Plan Note (Signed)
Chronic venous insufficiency. Continue Furosemide 40mg  bid, Spironolactone25mg  daily. Trace edema BLE

## 2015-12-25 NOTE — Assessment & Plan Note (Signed)
10/29/15 Hgb A1c 8.1. Chronic CAD, PVD. Continue Lantus 14u qd, Novolog 10u with meals for CBG>100, 12u if CBG>300. Monitor blood sugar.

## 2015-12-25 NOTE — Assessment & Plan Note (Signed)
Mild head titubation and hands tremor-not disabling, continue Propranolol 10mg bid 

## 2015-12-25 NOTE — Assessment & Plan Note (Signed)
Gradual decline. Continue Namenda 28mg  daily to preserve memory. Increased confusion w/o new focal neurological symptoms

## 2015-12-25 NOTE — Assessment & Plan Note (Signed)
Chronic issue, pain is controlled, continue Fentanyl 100mcg/hr 

## 2015-12-25 NOTE — Assessment & Plan Note (Signed)
Asymptomatic, continue Pepcid 20mg  

## 2015-12-25 NOTE — Assessment & Plan Note (Signed)
Stable, continue Cymbalta 30mg since 06/29/15. 

## 2015-12-25 NOTE — Assessment & Plan Note (Signed)
Continue Levothyroxine 112.5mcg Sat, 75mcg M, T, W, Thr, F, and Sun, last TSH 08/27/15 2.365 

## 2015-12-25 NOTE — Assessment & Plan Note (Signed)
10/29/15 Hgb A1c 8.1. Chronic CAD, PVD

## 2015-12-25 NOTE — Progress Notes (Signed)
Patient ID: [name], female   DOB: February 01, 1926, 82 y.o.   MRN: [id number]  Location:  SNF [facility] Provider:  Marlana Latus NP  Code Status:  DNR Goals of care: Advanced Directive information Does patient have an advance directive?: Yes, Type of Advance Directive: [place];Out of facility DNR (pink MOST or yellow form)  Chief Complaint  Patient presents with  . Medical Management of Chronic Issues    Diabeties     HPI: Patient is a 82 y.o. female seen in the SNF at [facility] today for evaluation of HTN, depression, dementia, blood sugar, over 300s about daily bases, takes Lantus 10u daily, 10 u with meals for CBG>100,  pressure ulcers at the R+L intergluteal cleft, stage II, slow healing.   Review of Systems  Constitutional: Negative for fever, chills and diaphoresis.       C/o aches allover  HENT: Positive for hearing loss. Negative for congestion, ear discharge and ear pain.   Eyes: Negative for pain, discharge and redness.  Respiratory: Negative for cough, shortness of breath and wheezing.   Cardiovascular: Positive for leg swelling. Negative for chest pain and palpitations.  Gastrointestinal: Negative for nausea, vomiting, abdominal pain and constipation.  Genitourinary: Positive for frequency. Negative for dysuria and urgency.       Recurrent urinary tract infections  Musculoskeletal: Positive for back pain. Negative for myalgias and neck pain.  Skin: Negative for rash.       Long standing excoriated buttocks with pressure ulcers L+R buttocks, pressure ulcers at the R+L intergluteal cleft, stage II, healthy wound beds, small amount of bleeding, no s/s of infection.    Neurological: Negative for dizziness, tremors, seizures, weakness and headaches.  Endo/Heme/Allergies: Negative for polydipsia.  Psychiatric/Behavioral: Positive for memory loss. Negative for suicidal ideas and hallucinations. The patient is nervous/anxious.        More confused    Past  Medical History  Diagnosis Date  . Diabetes mellitus   . Hypertension   . Heart murmur   . Benign familial tremor   . Rosacea   . Diverticulitis   . Heart failure     diastolic heart failure  . TIA (transient ischemic attack) 2010  . Arthritis   . Breast cancer ([place])     breast -rt  . CAD (coronary artery disease)     single vessel CAD per cath in 2006; scattered nonobstructive disease in the left system, left to right collaterals to the RCA which was occluded by flush shots; she has been managed medically  . Polycythemia 06/05/2015    Normal erythropoietin; O2 sat 95% room air     Patient Active Problem List   Diagnosis Date Noted  . Liver enzyme elevation 09/04/2015  . GERD (gastroesophageal reflux disease) 08/25/2015  . Blisters of multiple sites 08/07/2015  . Traumatic hematoma of left knee 08/07/2015  . Polycythemia 06/05/2015  . Erythrocytosis 05/01/2015  . Controlled type 2 DM with peripheral circulatory disorder ([place]) 11/04/2014  . Herpes zoster 09/30/2014  . Right-sided chest wall pain 09/30/2014  . Plantar fasciitis of left foot 07/18/2014  . Fecal incontinence 06/10/2014  . Left leg pain 04/18/2014  . Other malaise and fatigue 04/18/2014  . UTI (urinary tract infection) 02/09/2014  . Venous stasis dermatitis of both lower extremities 12/10/2013  . Candidiasis of skin 09/10/2013  . Dementia with behavioral disturbance 08/23/2013  . Right hip pain 08/13/2013  . PAF (paroxysmal atrial fibrillation) ([place]) 07/26/2013  . A-fib ([place]) 07/23/2013  .  Edema 06/27/2013  . Ulcer of foot due to diabetes mellitus ([place]) 06/27/2013  . Pressure ulcer stage II 06/18/2013  . Falls frequently 04/23/2013  . DNR (do not resuscitate) 04/23/2013  . Type 2 diabetes mellitus with diabetic neuropathy affecting both sides of body ([place]) 02/26/2013  . Hypothyroidism 02/26/2013  . Depression 02/26/2013  . Essential tremor 02/26/2013  . Back pain 02/26/2013  . HTN (hypertension)  02/26/2013  . Constipation 02/26/2013  . Ischemic heart disease 10/02/2012  . Leg swelling 10/02/2012  . Dyspepsia 10/02/2012  . Diabetic foot ulcer ([place]) 07/15/2011  . 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 Penicillins Other (See Comments)    Per MAR  . Bee Venom Other (See Comments)    Per MAR  . Celebrex [Celecoxib] Other (See Comments)    Per MAR  . Lidocaine Hcl Other (See Comments)    Per MAR  . Phenobarbital Other (See Comments)    Per MAR  . Procaine Hcl Other (See Comments)    Per mar    Medications: Patient's Medications  New Prescriptions   No medications on file  Previous Medications   ACETAMINOPHEN (TYLENOL) 325 MG TABLET    Take 2 tablets (650 mg total) by mouth every 4 (four) hours as needed.   AMINO ACIDS-PROTEIN HYDROLYS (FEEDING SUPPLEMENT, PRO-STAT SUGAR FREE 64,) LIQD    Take 30 mLs by mouth daily at 8 pm.   ARTIFICIAL TEAR OINTMENT (LACRI-LUBE OP)    Apply to eye. Apply to both eyes twice daily   ASPIRIN EC 81 MG TABLET    Take 81 mg by mouth daily.   DOCUSATE SODIUM (COLACE) 100 MG CAPSULE    Take 200 mg by mouth at bedtime.    DORZOLAMIDE-TIMOLOL (COSOPT) 22.3-6.8 MG/ML OPHTHALMIC SOLUTION    Place 1 drop into the left eye every 12 (twelve) hours.   DULOXETINE (CYMBALTA) 30 MG CAPSULE    Take 30 mg by mouth daily.    FAMOTIDINE (PEPCID) 20 MG TABLET    Take 20 mg by mouth at bedtime.   FENTANYL (DURAGESIC - DOSED MCG/HR) 100 MCG/HR    Apply one patch topically every 3 days for pain. Remove old patch before applying new patch.   FUROSEMIDE (LASIX) 40 MG TABLET    Take 40 mg by mouth 2 (two) times daily.    GLUCAGON (GLUCAGEN) 1 MG SOLR INJECTION    Inject 1 mg into the vein once as needed for low blood sugar.   GUAIFENESIN (ROBITUSSIN) 100 MG/5ML SYRUP    Take 15 mLs by mouth every 8 (eight) hours as needed for cough or congestion.    INSULIN ASPART (NOVOLOG) 100 UNIT/ML INJECTION    Inject 10 Units into  the skin 3 (three) times daily with meals. If CBG is greater than 100   INSULIN GLARGINE (LANTUS) 100 UNIT/ML INJECTION    Inject 14 Units into the skin at bedtime.    ISOSORBIDE MONONITRATE (IMDUR) 60 MG 24 HR TABLET    Take 60 mg by mouth every morning.    LEVOTHYROXINE (SYNTHROID, LEVOTHROID) 75 MCG TABLET    Take 75-112.5 mcg by mouth daily. Take 1 and 1/2 tablets on Saturdays then 1 tablet all other days   LINZESS 145 MCG CAPS CAPSULE    Take 145 mcg by mouth daily. Take 30 mins prior to breakfast on an empty stomach.   LORATADINE (CLARITIN) 10 MG TABLET    Take 10 mg by  mouth daily.    LOSARTAN (COZAAR) 25 MG TABLET    Take 25 mg by mouth daily.   MEMANTINE HCL ER (NAMENDA XR) 28 MG CP24    Take 28 mg by mouth daily.    METHYLCELLULOSE (ARTIFICIAL TEARS) 1 % OPHTHALMIC SOLUTION    Place 1 drop into both eyes 4 (four) times daily as needed (for dryness).   MULTIPLE VITAMINS-MINERALS (CERTAVITE SENIOR/ANTIOXIDANT PO)    Take 1 tablet by mouth daily.   NITROGLYCERIN (NITROSTAT) 0.4 MG SL TABLET    Place 0.4 mg under the tongue every 5 (five) minutes as needed. For chest pain   NUTRITIONAL SUPPLEMENTS (RESOURCE 2.0 PO)    Take 120 mLs by mouth 2 (two) times daily.    PROBIOTIC PRODUCT (ALIGN) 4 MG CAPS    Take 1 capsule by mouth daily.   PROPRANOLOL (INDERAL) 20 MG TABLET    Take 20 mg by mouth 2 (two) times daily.   SPIRONOLACTONE (ALDACTONE) 25 MG TABLET    Take 25 mg by mouth daily.    VITAMIN D, ERGOCALCIFEROL, (DRISDOL) 50000 UNITS CAPS    Take 50,000 Units by mouth every 30 (thirty) days. On the first of the month  Modified Medications   No medications on file  Discontinued Medications   ALUM & MAG HYDROXIDE-SIMETH (MAALOX/MYLANTA) 200-200-20 MG/5ML SUSPENSION    Take 30 mLs by mouth every 6 (six) hours as needed for indigestion or heartburn.   AYR SALINE NASAL NO-DRIP GEL    Place into the nose 2 (two) times daily as needed (nose bleeds).   GLUCOSAMINE-CHONDROITIN 500-400 MG TABLET     Take 1 tablet by mouth 3 (three) times daily.   HYDROXYZINE (ATARAX/VISTARIL) 25 MG TABLET    Take 25 mg by mouth at bedtime as needed. For itching   LUBIPROSTONE (AMITIZA) 24 MCG CAPSULE    Take 24 mcg by mouth 2 (two) times daily with a meal.     OMEPRAZOLE (PRILOSEC) 20 MG CAPSULE    Take 20 mg by mouth daily.     Physical Exam: Filed Vitals:   12/25/15 1306  BP: 132/80  Pulse: 72  Resp: 18   There is no weight on file to calculate BMI.  Physical Exam  Constitutional: She appears well-developed.  HENT:  Head: Normocephalic and atraumatic.  Left Ear: External ear normal.  Eyes: Conjunctivae and EOM are normal. Pupils are equal, round, and reactive to light.  Neck: Neck supple. No JVD present. No thyromegaly present.  Cardiovascular: Normal rate and regular rhythm.   Murmur heard. Pulmonary/Chest: Effort normal. She has rales.  Bibasilar rales.   Abdominal: Soft. There is no tenderness. There is no rebound.  Musculoskeletal: She exhibits edema and tenderness.  Back pain is chronic which is well controlled. BLE chronic edema trace only. Left foot external deviated deformity.   Lymphadenopathy:    She has no cervical adenopathy.  Neurological: She is alert. No cranial nerve deficit. Coordination normal.  Skin: Skin is warm and dry. Rash noted.  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 R+L   Small open areas lateral left lower leg, anterior right lower leg. Pressure ulcers at the R+L intergluteal cleft, stage II, healthy wound beds, small amount of bleeding, no s/s of infection.    Psychiatric: Her mood appears anxious. Her affect is not blunt, not labile and not inappropriate. Her speech is rapid and/or pressured (occasionally ). Her speech is not delayed and not slurred. She is hyperactive. She is not agitated, not aggressive, not  slowed, not withdrawn, not actively hallucinating and not combative. Thought content is not paranoid and not delusional. She does not exhibit a depressed mood.  She exhibits abnormal recent memory.  MMSE 21/30 08/2013    Labs reviewed: Basic Metabolic Panel:  Recent Labs  04/27/15 08/27/15 10/29/15  NA 137 137 140  K 3.7 4.1 3.8  BUN 28* 36* 35*  CREATININE 0.9 1.2* 1.0    Liver Function Tests:  Recent Labs  04/27/15 08/27/15 10/29/15  AST 17 88* 24  ALT 14 100* 15  ALKPHOS 101 120 79    CBC:  Recent Labs  06/02/15 1455 08/27/15 10/29/15  WBC 7.7 9.3 6.7  NEUTROABS 5.2  --   --   HGB 16.5* 17.8* 15.2  HCT 48.9* 50* 45  MCV 98.2  --   --   PLT 216 218 171    Lab Results  Component Value Date   TSH 2.37 08/27/2015   Lab Results  Component Value Date   HGBA1C 8.1 10/29/2015   Lab Results  Component Value Date   CHOL  10/22/2010    100        ATP III CLASSIFICATION:  <200     mg/dL   Desirable  200-239  mg/dL   Borderline High  >=240    mg/dL   High          HDL 55 10/22/2010   LDLCALC  10/22/2010    29        Total Cholesterol/HDL:CHD Risk Coronary Heart Disease Risk Table                     Men   Women  1/2 Average Risk   3.4   3.3  Average Risk       5.0   4.4  2 X Average Risk   9.6   7.1  3 X Average Risk  23.4   11.0        Use the calculated Patient Ratio above and the CHD Risk Table to determine the patient's CHD Risk.        ATP III CLASSIFICATION (LDL):  <100     mg/dL   Optimal  100-129  mg/dL   Near or Above                    Optimal  130-159  mg/dL   Borderline  160-189  mg/dL   High  >190     mg/dL   Very High   TRIG 80 10/22/2010   CHOLHDL 1.8 10/22/2010    Significant Diagnostic Results since last visit: none  Patient Care Team: Reynold Bowen, MD as PCP - General (Endocrinology) Darlin Coco, MD (Cardiology) Arloa Koh, MD (Radiation Oncology) Neldon Mc, MD (General Surgery)  Assessment/Plan Problem List Items Addressed This Visit    Type 2 diabetes mellitus with diabetic neuropathy affecting both sides of body ([place]) - Primary (Chronic)    10/29/15 Hgb A1c  8.1. Chronic CAD, PVD. Continue Lantus 14u qd, Novolog 10u with meals for CBG>100, 12u if CBG>300. Monitor blood sugar.       Hypothyroidism (Chronic)    Continue Levothyroxine 112.2mcg Sat, 60mcg M, T, W, Thr, F, and Sun, last TSH 08/27/15 2.365      HTN (hypertension) (Chronic)    Controlled, continue Losartan 25mg  daily, Atenolol 20mg  bid, Furosemide 40mg  bid, Spironolactone 25mg , Imdur 60mg       Edema (Chronic)    Chronic venous insufficiency. Continue Furosemide 40mg   bid, Spironolactone25mg  daily. Trace edema BLE      A-fib (HCC) (Chronic)    02/03/14 doing well, EKG A-fib with controlled VR.  Heart rate is in control, continue Atenolol.      Dementia with behavioral disturbance (Chronic)    Gradual decline. Continue Namenda 28mg  daily to preserve memory. Increased confusion w/o new focal neurological symptoms      Controlled type 2 DM with peripheral circulatory disorder (HCC) (Chronic)    10/29/15 Hgb A1c 8.1. Chronic CAD, PVD      Depression    Stable, continue Cymbalta 30mg  since 06/29/15      Essential tremor    Mild head titubation and hands tremor-not disabling, continue Propranolol 10mg  bid      Back pain    Chronic issue, pain is controlled, continue Fentanyl 134mcg/hr      Constipation    Stable, continue colace 100 II qhs, Linzess 122mcg, MiraLax prn      GERD (gastroesophageal reflux disease)    Asymptomatic, continue Pepcid 20mg       Relevant Medications   famotidine (PEPCID) 20 MG tablet       Family/ staff Communication: continue SNF for care needs,  Labs/tests ordered:  CBC, CMP, Hgb A1c, UA C/S done 10/29/15  [facility] Thailan Sava NP Geriatrics [facility] [address], [place] [address] On Call:  [phone] & follow prompts after 5pm & weekends Office Phone:  [phone] Office Fax:  [phone]

## 2016-01-08 ENCOUNTER — Non-Acute Institutional Stay (SKILLED_NURSING_FACILITY): Payer: Medicare Other | Admitting: Internal Medicine

## 2016-01-08 ENCOUNTER — Encounter: Payer: Self-pay | Admitting: Internal Medicine

## 2016-01-08 DIAGNOSIS — R609 Edema, unspecified: Secondary | ICD-10-CM

## 2016-01-08 DIAGNOSIS — E039 Hypothyroidism, unspecified: Secondary | ICD-10-CM

## 2016-01-08 DIAGNOSIS — E1151 Type 2 diabetes mellitus with diabetic peripheral angiopathy without gangrene: Secondary | ICD-10-CM | POA: Diagnosis not present

## 2016-01-08 DIAGNOSIS — F03918 Unspecified dementia, unspecified severity, with other behavioral disturbance: Secondary | ICD-10-CM

## 2016-01-08 DIAGNOSIS — I10 Essential (primary) hypertension: Secondary | ICD-10-CM

## 2016-01-08 DIAGNOSIS — F0391 Unspecified dementia with behavioral disturbance: Secondary | ICD-10-CM | POA: Diagnosis not present

## 2016-01-08 NOTE — Progress Notes (Signed)
Location:  [facility] Room Number: N 2 Place of Service:  SNF (31) Provider:  Jeanmarie Hubert MD  Estill Dooms, MD  Patient Care Team: Reynold Bowen, MD as PCP - General (Endocrinology) Darlin Coco, MD (Cardiology) Arloa Koh, MD (Radiation Oncology) Neldon Mc, MD (General Surgery)  Extended Emergency Contact Information Primary Emergency Contact: Skillern,Clifton B Address: [address]          [place], [place] [address] [place] Phone: [phone] Mobile Phone: [phone] Relation: Spouse Secondary Emergency Contact: Dorna Leitz Address: [place], [place] [address] [name] of [place] Phone: [phone] Mobile Phone: [phone] Relation: Daughter  Code Status: DNR Goals of care: Advanced Directive information Advanced Directives 01/08/2016  Does patient have an advance directive? Yes  Type of Paramedic of [place];Out of facility DNR (pink MOST or yellow form)  Does patient want to make changes to advanced directive? No - Patient declined  Copy of advanced directive(s) in chart? Yes     Chief Complaint  Patient presents with  . Medical Management of Chronic Issues    HPI:  Pt is a 82 y.o. female seen today for medical management of chronic diseases.    Controlled type 2 DM with peripheral circulatory disorder (HCC) - diabetes is under reasonable control given her obesity and lack of dietary compliance at times  Dementia with behavioral disturbance - slow loss of memory   Edema, unspecified type - persistent problem that is unchanged over the last several years. Associated with thin scan and multiple wounds. Currently has only very small lesions.   Essential hypertension - controlled  Hypothyroidism, unspecified hypothyroidism type - compensated     Past Medical History  Diagnosis Date  . Diabetes mellitus   . Hypertension   .  Heart murmur   . Benign familial tremor   . Rosacea   . Diverticulitis   . Heart failure     diastolic heart failure  . TIA (transient ischemic attack) 2010  . Arthritis   . Breast cancer ([place])     breast -rt  . CAD (coronary artery disease)     single vessel CAD per cath in 2006; scattered nonobstructive disease in the left system, left to right collaterals to the RCA which was occluded by flush shots; she has been managed medically  . Polycythemia 06/05/2015    Normal erythropoietin; O2 sat 95% room air    Past Surgical History  Procedure Laterality Date  . Mastectomy partial / lumpectomy  11/02/2000    Right, with SLN  . Mastectomy partial / lumpectomy  05/15/2008    Left  . Breast lumpectomy  01/19/2010    right  . Mastectomy  03/31/2010    Right    Allergies  Allergen Reactions  . Penicillins Other (See Comments)    Per MAR  . Bee Venom Other (See Comments)    Per MAR  . Celebrex [Celecoxib] Other (See Comments)    Per MAR  . Lidocaine Hcl Other (See Comments)    Per MAR  . Phenobarbital Other (See Comments)    Per MAR  . Procaine Hcl Other (See Comments)    Per mar      Medication List       This list is accurate as of: 01/08/16  3:59 PM.  Always use your most recent med list.  acetaminophen 325 MG tablet  Commonly known as:  TYLENOL  Take 2 tablets (650 mg total) by mouth every 4 (four) hours as needed.     ALIGN 4 MG Caps  Take 1 capsule by mouth daily.     aspirin EC 81 MG tablet  Take 81 mg by mouth daily.     CERTAVITE SENIOR/ANTIOXIDANT PO  Take 1 tablet by mouth daily.     DIABETIC TUSSIN EX 100 MG/5ML liquid  Generic drug:  guaiFENesin  Take 15 mLs by mouth every 8 (eight) hours as needed for cough.     docusate sodium 100 MG capsule  Commonly known as:  COLACE  Take 200 mg by mouth at bedtime.     dorzolamide-timolol 22.3-6.8 MG/ML ophthalmic solution  Commonly known as:  COSOPT  Place 1 drop into the left eye every 12  (twelve) hours.     DULoxetine 30 MG capsule  Commonly known as:  CYMBALTA  Take 30 mg by mouth daily.     famotidine 20 MG tablet  Commonly known as:  PEPCID  Take 20 mg by mouth at bedtime.     feeding supplement (PRO-STAT SUGAR FREE 64) Liqd  Take 30 mLs by mouth daily at 8 pm.     fentaNYL 100 MCG/HR  Commonly known as:  DURAGESIC - dosed mcg/hr  Apply one patch topically every 3 days for pain. Remove old patch before applying new patch.     furosemide 40 MG tablet  Commonly known as:  LASIX  Take 40 mg by mouth 2 (two) times daily.     GLUCAGEN 1 MG Solr injection  Generic drug:  glucagon  Inject 1 mg into the vein once as needed for low blood sugar.     insulin aspart 100 UNIT/ML injection  Commonly known as:  novoLOG  Inject 10 Units into the skin 3 (three) times daily with meals. If CBG is greater than 100     insulin glargine 100 UNIT/ML injection  Commonly known as:  LANTUS  Inject 14 Units into the skin at bedtime.     isosorbide mononitrate 60 MG 24 hr tablet  Commonly known as:  IMDUR  Take 60 mg by mouth every morning.     LACRI-LUBE OP  Apply to eye. Apply to both eyes twice daily     levothyroxine 75 MCG tablet  Commonly known as:  SYNTHROID, LEVOTHROID  Take 75-112.5 mcg by mouth daily. Take 1 and 1/2 tablets on Saturdays then 1 tablet all other days     LINZESS 145 MCG Caps capsule  Generic drug:  Linaclotide  Take 145 mcg by mouth daily. Take 30 mins prior to breakfast on an empty stomach.     loratadine 10 MG tablet  Commonly known as:  CLARITIN  Take 10 mg by mouth daily.     losartan 25 MG tablet  Commonly known as:  COZAAR  Take 25 mg by mouth daily.     methylcellulose 1 % ophthalmic solution  Commonly known as:  ARTIFICIAL TEARS  Place 1 drop into both eyes 4 (four) times daily as needed (for dryness).     NAMENDA XR 28 MG Cp24 24 hr capsule  Generic drug:  memantine  Take 28 mg by mouth daily.     nitroGLYCERIN 0.4 MG SL tablet   Commonly known as:  NITROSTAT  Place 0.4 mg under the tongue every 5 (five) minutes as needed. For chest pain     propranolol 20 MG  tablet  Commonly known as:  INDERAL  Take 20 mg by mouth 2 (two) times daily.     RESOURCE 2.0 PO  Take 120 mLs by mouth 2 (two) times daily.     spironolactone 25 MG tablet  Commonly known as:  ALDACTONE  Take 25 mg by mouth daily.     Vitamin D (Ergocalciferol) 50000 units Caps capsule  Commonly known as:  DRISDOL  Take 50,000 Units by mouth every 30 (thirty) days. On the first of the month        Review of Systems  Constitutional: Negative for fever, chills and diaphoresis.       Generalized aching and discomfort. Overweight.  HENT: Positive for hearing loss. Negative for congestion, ear discharge, ear pain, nosebleeds, sore throat and tinnitus.   Eyes: Negative for photophobia, pain, discharge and redness.  Respiratory: Negative for cough, shortness of breath, wheezing and stridor.   Cardiovascular: Positive for leg swelling. Negative for chest pain and palpitations.  Gastrointestinal: Negative for nausea, vomiting, abdominal pain, diarrhea, constipation and blood in stool.  Endocrine: Negative for polydipsia.       Diabetic with circulatory and neurologic complications  Genitourinary: Positive for frequency. Negative for dysuria, urgency, hematuria and flank pain.       Recurrent urinary tract infections  Musculoskeletal: Positive for back pain. Negative for myalgias and neck pain.  Skin: Positive for rash.  Allergic/Immunologic: Negative for environmental allergies.  Neurological: Negative for dizziness, tremors, seizures, weakness and headaches.       Demented  Psychiatric/Behavioral: Negative for suicidal ideas and hallucinations. The patient is nervous/anxious.     Immunization History  Administered Date(s) Administered  . Influenza Whole 09/04/2013  . Influenza-Unspecified 08/21/2014, 08/21/2015  . PPD Test 04/26/2010    Pertinent  Health Maintenance Due  Topic Date Due  . OPHTHALMOLOGY EXAM  10/15/1936  . DEXA SCAN  10/15/1993  . PNA vac Low Risk Adult (1 of 2 - PCV13) 10/15/1993  . FOOT EXAM  02/05/2016  . HEMOGLOBIN A1C  04/28/2016  . INFLUENZA VACCINE  06/14/2016   Fall Risk  06/02/2015 02/05/2015 11/04/2014  Falls in the past year? No No Yes  Number falls in past yr: - - 2 or more  Risk for fall due to : - Impaired balance/gait History of fall(s);Impaired balance/gait;Impaired mobility    Filed Vitals:   01/08/16 1532  BP: 136/70  Pulse: 70  Temp: 98.9 F (37.2 C)  TempSrc: Oral  Resp: 20  SpO2: 95%   There is no weight on file to calculate BMI. Physical Exam  Constitutional: She appears well-developed.  HENT:  Head: Normocephalic and atraumatic.  Left Ear: External ear normal.  Eyes: Conjunctivae and EOM are normal. Pupils are equal, round, and reactive to light.  Neck: Neck supple. No JVD present. No thyromegaly present.  Cardiovascular: Normal rate and regular rhythm.   Murmur heard. Pulmonary/Chest: Effort normal. She has rales.  Bibasilar rales.   Abdominal: Soft. There is no tenderness. There is no rebound.  Musculoskeletal: She exhibits edema and tenderness.  Back pain is chronic which is well controlled. BLE chronic edema trace only. Left foot external deviated deformity.   Lymphadenopathy:    She has no cervical adenopathy.  Neurological: She is alert. No cranial nerve deficit. Coordination normal.  Skin: Skin is warm and dry. Rash noted.  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 R+L   Small open areas lateral left lower leg, anterior right lower leg.   Psychiatric: She has a normal  mood and affect. Her affect is not blunt, not labile and not inappropriate. Her speech is rapid and/or pressured (occasionally ). Her speech is not delayed and not slurred. She is hyperactive. She is not agitated, not aggressive, not slowed, not withdrawn, not actively hallucinating and not combative.  Thought content is not paranoid and not delusional. She does not exhibit a depressed mood. She exhibits abnormal recent memory.  MMSE 21/30 08/2013    Labs reviewed:  Recent Labs  04/27/15 08/27/15 10/29/15  NA 137 137 140  K 3.7 4.1 3.8  BUN 28* 36* 35*  CREATININE 0.9 1.2* 1.0    Recent Labs  04/27/15 08/27/15 10/29/15  AST 17 88* 24  ALT 14 100* 15  ALKPHOS 101 120 79    Recent Labs  06/02/15 1455 08/27/15 10/29/15  WBC 7.7 9.3 6.7  NEUTROABS 5.2  --   --   HGB 16.5* 17.8* 15.2  HCT 48.9* 50* 45  MCV 98.2  --   --   PLT 216 218 171   Lab Results  Component Value Date   TSH 2.37 08/27/2015   Lab Results  Component Value Date   HGBA1C 8.1 10/29/2015   Lab Results  Component Value Date   CHOL  10/22/2010    100        ATP III CLASSIFICATION:  <200     mg/dL   Desirable  200-239  mg/dL   Borderline High  >=240    mg/dL   High          HDL 55 10/22/2010   LDLCALC  10/22/2010    29        Total Cholesterol/HDL:CHD Risk Coronary Heart Disease Risk Table                     Men   Women  1/2 Average Risk   3.4   3.3  Average Risk       5.0   4.4  2 X Average Risk   9.6   7.1  3 X Average Risk  23.4   11.0        Use the calculated Patient Ratio above and the CHD Risk Table to determine the patient's CHD Risk.        ATP III CLASSIFICATION (LDL):  <100     mg/dL   Optimal  100-129  mg/dL   Near or Above                    Optimal  130-159  mg/dL   Borderline  160-189  mg/dL   High  >190     mg/dL   Very High   TRIG 80 10/22/2010   CHOLHDL 1.8 10/22/2010    Assessment/Plan 1. Controlled type 2 DM with peripheral circulatory disorder (HCC) Continue current medications  2. Dementia with behavioral disturbance No change in medication  3. Edema, unspecified type Continue current medication  4. Essential hypertension Continue current medication  5. Hypothyroidism, unspecified hypothyroidism type Continue current medication. Repeat TSH twice  yearly. Last done October 2016.

## 2016-01-26 ENCOUNTER — Non-Acute Institutional Stay (SKILLED_NURSING_FACILITY): Payer: Medicare Other | Admitting: Nurse Practitioner

## 2016-01-26 ENCOUNTER — Encounter: Payer: Self-pay | Admitting: Nurse Practitioner

## 2016-01-26 DIAGNOSIS — I481 Persistent atrial fibrillation: Secondary | ICD-10-CM

## 2016-01-26 DIAGNOSIS — E039 Hypothyroidism, unspecified: Secondary | ICD-10-CM

## 2016-01-26 DIAGNOSIS — I4819 Other persistent atrial fibrillation: Secondary | ICD-10-CM

## 2016-01-26 DIAGNOSIS — I10 Essential (primary) hypertension: Secondary | ICD-10-CM | POA: Diagnosis not present

## 2016-01-26 DIAGNOSIS — K219 Gastro-esophageal reflux disease without esophagitis: Secondary | ICD-10-CM | POA: Diagnosis not present

## 2016-01-26 DIAGNOSIS — R627 Adult failure to thrive: Secondary | ICD-10-CM | POA: Diagnosis not present

## 2016-01-26 DIAGNOSIS — R609 Edema, unspecified: Secondary | ICD-10-CM | POA: Diagnosis not present

## 2016-01-26 DIAGNOSIS — F03918 Unspecified dementia, unspecified severity, with other behavioral disturbance: Secondary | ICD-10-CM

## 2016-01-26 DIAGNOSIS — E1151 Type 2 diabetes mellitus with diabetic peripheral angiopathy without gangrene: Secondary | ICD-10-CM | POA: Diagnosis not present

## 2016-01-26 DIAGNOSIS — F329 Major depressive disorder, single episode, unspecified: Secondary | ICD-10-CM | POA: Diagnosis not present

## 2016-01-26 DIAGNOSIS — M5440 Lumbago with sciatica, unspecified side: Secondary | ICD-10-CM | POA: Diagnosis not present

## 2016-01-26 DIAGNOSIS — G250 Essential tremor: Secondary | ICD-10-CM | POA: Diagnosis not present

## 2016-01-26 DIAGNOSIS — F0391 Unspecified dementia with behavioral disturbance: Secondary | ICD-10-CM | POA: Diagnosis not present

## 2016-01-26 DIAGNOSIS — K5900 Constipation, unspecified: Secondary | ICD-10-CM

## 2016-01-26 DIAGNOSIS — F32A Depression, unspecified: Secondary | ICD-10-CM

## 2016-01-26 DIAGNOSIS — E1142 Type 2 diabetes mellitus with diabetic polyneuropathy: Secondary | ICD-10-CM

## 2016-01-26 NOTE — Assessment & Plan Note (Signed)
Stable, continue colace 100 II qhs, Linzess 145mcg, MiraLax prn. 

## 2016-01-26 NOTE — Assessment & Plan Note (Signed)
Controlled, continue Losartan 25mg  daily, Propranolol 20mg  bid, Furosemide 40mg  bid, Spironolactone 25mg , Imdur 60mg 

## 2016-01-26 NOTE — Assessment & Plan Note (Signed)
Continue Levothyroxine 112.33mcg Sat, 45mcg M, T, W, Thr, F, and Sun, last TSH 08/27/15 2.365, update TSH

## 2016-01-26 NOTE — Assessment & Plan Note (Signed)
10/29/15 Hgb A1c 8.1. Chronic CAD, PVD Continue Lantus 14u, Novolog 10u with meals, additional 2u if CBG>300

## 2016-01-26 NOTE — Assessment & Plan Note (Signed)
10/29/15 Hgb A1c 8.1. Chronic CAD, PVD, continue Lantus 14u daily, novolog 10u with meals, addition 2 u if CBG>300

## 2016-01-26 NOTE — Assessment & Plan Note (Signed)
Stable, continue Cymbalta 30mg since 06/29/15. 

## 2016-01-26 NOTE — Assessment & Plan Note (Signed)
Asymptomatic, continue Famotidine 20mg 

## 2016-01-26 NOTE — Assessment & Plan Note (Signed)
Chronic venous insufficiency. Continue Furosemide 40mg  bid, Spironolactone25mg  daily. Trace edema BLE, update CMP

## 2016-01-26 NOTE — Assessment & Plan Note (Signed)
Supportive care, continue comfort care with Fentanyl 69mcg/hr, dc Vit D, Loratadine, Certavite, and Glucosamine-questionable benefit.

## 2016-01-26 NOTE — Assessment & Plan Note (Signed)
02/03/14 doing well, EKG A-fib with controlled VR.  Heart rate is in control, continue Propranolol.

## 2016-01-26 NOTE — Assessment & Plan Note (Signed)
Chronic issue, pain is controlled, decrease Fentanyl 56mcg/hr after 153mcg/hr exhausted.

## 2016-01-26 NOTE — Assessment & Plan Note (Signed)
Gradual decline. Continue Namenda 28mg  daily to preserve memory. Increased confusion w/o new focal neurological symptoms, update CBC, CMP, TSH

## 2016-01-26 NOTE — Progress Notes (Signed)
Patient ID: [name], female   DOB: 04-23-1926, 82 y.o.   MRN: [id number]  Location:  [facility] Room Number: N-2 Place of Service:  SNF (31) Provider: Lennie Odor Eddis Pingleton NP  GREEN, Viviann Spare, MD  Patient Care Team: Estill Dooms, MD as PCP - General (Internal Medicine) Darlin Coco, MD (Cardiology) Arloa Koh, MD (Radiation Oncology) Neldon Mc, MD (General Surgery)  Extended Emergency Contact Information Primary Emergency Contact: Coke,[address] B Address: [address]          [place], [place] [address] [place] Phone: [phone] Mobile Phone: [phone] Relation: Spouse Secondary Emergency Contact: Dorna Leitz Address: [place], [place] [address] [name] of [employer] Phone: [phone] Mobile Phone: [phone] Relation: Daughter  Code Status:  DNR Goals of care: Advanced Directive information Advanced Directives 01/26/2016  Does patient have an advance directive? Yes  Type of Paramedic of [place];Out of facility DNR (pink MOST or yellow form)  Does patient want to make changes to advanced directive? No - Patient declined  Copy of advanced directive(s) in chart? Yes     Chief Complaint  Patient presents with  . Medical Management of Chronic Issues    Routine Visit    HPI:  Pt is a 82 y.o. female seen today for medical management of chronic diseases.  HTN, controlled while on Losartan 25mg , Imdur 60mg , Furosemide 40mg  bid, Spironolactone 25mg , Propranolol 25mg  bid,  Depression, stable on Cymbalta 30mg  daily, dementia, gradual declined, more bed rest, taking Namenda, blood sugar, takes Lantus 14u daily, 10 u with meals for CBG>100,  pressure ulcers at the R+L intergluteal cleft, stage II, slow healing. Hypothyroidism, last TSH wnl 10/2015, taking Levothyroxine 75mg  daily except Sat 157mcg. Chronic pain in the lower back, taking Fentanyl 150mcg/hr, now  less pain complained since she is more bedrest. Constipation, stable while on Liness 125mcg daily and Colace 200mg  hs. GERD stable on Famotidine 20mg  daily.    Past Medical History  Diagnosis Date  . Diabetes mellitus   . Hypertension   . Heart murmur   . Benign familial tremor   . Rosacea   . Diverticulitis   . Heart failure     diastolic heart failure  . TIA (transient ischemic attack) 2010  . Arthritis   . Breast cancer ([facility])     breast -rt  . CAD (coronary artery disease)     single vessel CAD per cath in 2006; scattered nonobstructive disease in the left system, left to right collaterals to the RCA which was occluded by flush shots; she has been managed medically  . Polycythemia 06/05/2015    Normal erythropoietin; O2 sat 95% room air    Past Surgical History  Procedure Laterality Date  . Mastectomy partial / lumpectomy  11/02/2000    Right, with SLN  . Mastectomy partial / lumpectomy  05/15/2008    Left  . Breast lumpectomy  01/19/2010    right  . Mastectomy  03/31/2010    Right    Allergies  Allergen Reactions  . Penicillins Other (See Comments)    Per MAR  . Bee Venom Other (See Comments)    Per MAR  . Celebrex [Celecoxib] Other (See Comments)    Per MAR  . Lidocaine Hcl Other (See Comments)    Per MAR  . Phenobarbital Other (See Comments)    Per MAR  . Procaine Hcl Other (See  Comments)    Per mar      Medication List       This list is accurate as of: 01/26/16 11:43 AM.  Always use your most recent med list.               acetaminophen 325 MG tablet  Commonly known as:  TYLENOL  Take 2 tablets (650 mg total) by mouth every 4 (four) hours as needed.     ALIGN 4 MG Caps  Take 1 capsule by mouth daily.     aspirin EC 81 MG tablet  Take 81 mg by mouth daily.     CERTAVITE SENIOR/ANTIOXIDANT PO  Take 1 tablet by mouth daily.     DIABETIC TUSSIN EX 100 MG/5ML liquid  Generic drug:  guaiFENesin  Take 15 mLs by mouth every 8 (eight) hours as needed  for cough.     docusate sodium 100 MG capsule  Commonly known as:  COLACE  Take 200 mg by mouth at bedtime.     dorzolamide-timolol 22.3-6.8 MG/ML ophthalmic solution  Commonly known as:  COSOPT  Place 1 drop into the left eye every 12 (twelve) hours.     DULoxetine 30 MG capsule  Commonly known as:  CYMBALTA  Take 30 mg by mouth daily.     famotidine 20 MG tablet  Commonly known as:  PEPCID  Take 20 mg by mouth at bedtime.     feeding supplement (PRO-STAT SUGAR FREE 64) Liqd  Take 30 mLs by mouth daily at 8 pm.     feeding supplement Liqd  Take 1 Container by mouth 3 (three) times daily between meals.     fentaNYL 100 MCG/HR  Commonly known as:  DURAGESIC - dosed mcg/hr  Apply one patch topically every 3 days for pain. Remove old patch before applying new patch.     furosemide 40 MG tablet  Commonly known as:  LASIX  Take 40 mg by mouth 2 (two) times daily.     GLUCAGEN 1 MG Solr injection  Generic drug:  glucagon  Inject 1 mg into the vein once as needed for low blood sugar.     glucosamine-chondroitin 500-400 MG tablet  Take 1 tablet by mouth 3 (three) times daily.     insulin aspart 100 UNIT/ML injection  Commonly known as:  novoLOG  Inject 10 Units into the skin 3 (three) times daily with meals. If CBG is greater than 100, Give additional 2 units for CBG >300     insulin glargine 100 UNIT/ML injection  Commonly known as:  LANTUS  Inject 14 Units into the skin at bedtime.     isosorbide mononitrate 60 MG 24 hr tablet  Commonly known as:  IMDUR  Take 60 mg by mouth every morning.     LACRI-LUBE OP  Apply to eye. Apply to both eyes twice daily     levothyroxine 75 MCG tablet  Commonly known as:  SYNTHROID, LEVOTHROID  Take 75-112.5 mcg by mouth daily. Take 1 and 1/2 tablets on Saturdays then 1 tablet all other days     LINZESS 145 MCG Caps capsule  Generic drug:  Linaclotide  Take 145 mcg by mouth daily. Take 30 mins prior to breakfast on an empty  stomach.     loratadine 10 MG tablet  Commonly known as:  CLARITIN  Take 10 mg by mouth daily.     losartan 25 MG tablet  Commonly known as:  COZAAR  Take 25 mg by mouth  daily.     methylcellulose 1 % ophthalmic solution  Commonly known as:  ARTIFICIAL TEARS  Place 1 drop into both eyes 4 (four) times daily as needed (for dryness).     NAMENDA XR 28 MG Cp24 24 hr capsule  Generic drug:  memantine  Take 28 mg by mouth daily.     nitroGLYCERIN 0.4 MG SL tablet  Commonly known as:  NITROSTAT  Place 0.4 mg under the tongue every 5 (five) minutes as needed. For chest pain     propranolol 20 MG tablet  Commonly known as:  INDERAL  Take 20 mg by mouth 2 (two) times daily.     spironolactone 25 MG tablet  Commonly known as:  ALDACTONE  Take 25 mg by mouth daily.     Vitamin D (Ergocalciferol) 50000 units Caps capsule  Commonly known as:  DRISDOL  Take 50,000 Units by mouth every 30 (thirty) days. On the first of the month        Review of Systems  Constitutional: Positive for activity change, appetite change and fatigue. Negative for fever, chills and diaphoresis.       C/o aches allover  HENT: Positive for hearing loss. Negative for congestion, ear discharge and ear pain.   Eyes: Negative for pain, discharge and redness.  Respiratory: Negative for cough, shortness of breath and wheezing.   Cardiovascular: Positive for leg swelling. Negative for chest pain and palpitations.  Gastrointestinal: Negative for nausea, vomiting, abdominal pain and constipation.  Endocrine: Negative for polydipsia.  Genitourinary: Positive for frequency. Negative for dysuria and urgency.       Recurrent urinary tract infections  Musculoskeletal: Positive for back pain. Negative for myalgias and neck pain.  Skin: Negative for rash.       Long standing excoriated buttocks with pressure ulcers L+R buttocks, pressure ulcers at the R+L intergluteal cleft, stage II, healthy wound beds, small amount of  bleeding, no s/s of infection.    Neurological: Negative for dizziness, tremors, seizures, weakness and headaches.  Psychiatric/Behavioral: Negative for suicidal ideas and hallucinations. The patient is nervous/anxious.        More confused    Immunization History  Administered Date(s) Administered  . Influenza Whole 09/04/2013  . Influenza-Unspecified 08/21/2014, 08/21/2015  . PPD Test 04/26/2010   Pertinent  Health Maintenance Due  Topic Date Due  . OPHTHALMOLOGY EXAM  10/15/1938  . DEXA SCAN  10/15/1993  . PNA vac Low Risk Adult (1 of 2 - PCV13) 10/15/1993  . FOOT EXAM  02/05/2016  . HEMOGLOBIN A1C  04/28/2016  . INFLUENZA VACCINE  06/14/2016   Fall Risk  06/02/2015 02/05/2015 11/04/2014  Falls in the past year? No No Yes  Number falls in past yr: - - 2 or more  Risk for fall due to : - Impaired balance/gait History of fall(s);Impaired balance/gait;Impaired mobility   Functional Status Survey:    Filed Vitals:   01/26/16 0928  BP: 132/66  Pulse: 100  Temp: 96.9 F (36.1 C)  TempSrc: Oral  Resp: 18  Height: 5\' 3"  (1.6 m)  Weight: 186 lb (84.369 kg)   Body mass index is 32.96 kg/(m^2). Physical Exam  Constitutional: She appears well-developed.  HENT:  Head: Normocephalic and atraumatic.  Left Ear: External ear normal.  Eyes: Conjunctivae and EOM are normal. Pupils are equal, round, and reactive to light.  Neck: Neck supple. No JVD present. No thyromegaly present.  Cardiovascular: Normal rate and regular rhythm.   Murmur heard. Pulmonary/Chest: Effort normal. She  has rales.  Bibasilar rales.   Abdominal: Soft. There is no tenderness. There is no rebound.  Musculoskeletal: She exhibits edema and tenderness.  Back pain is chronic which is well controlled. BLE chronic edema trace only. Left foot external deviated deformity.   Lymphadenopathy:    She has no cervical adenopathy.  Neurological: She is alert. No cranial nerve deficit. Coordination normal.  Skin: Skin  is warm and dry. Rash noted.  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 R+L   Small open areas lateral left lower leg, anterior right lower leg. Pressure ulcers at the R+L intergluteal cleft, stage II, healthy wound beds, small amount of bleeding, no s/s of infection.    Psychiatric: Her mood appears anxious. Her affect is not blunt, not labile and not inappropriate. Her speech is rapid and/or pressured (occasionally ). Her speech is not delayed and not slurred. She is hyperactive. She is not agitated, not aggressive, not slowed, not withdrawn, not actively hallucinating and not combative. Thought content is not paranoid and not delusional. She does not exhibit a depressed mood. She exhibits abnormal recent memory.  MMSE 21/30 08/2013    Labs reviewed:  Recent Labs  04/27/15 08/27/15 10/29/15  NA 137 137 140  K 3.7 4.1 3.8  BUN 28* 36* 35*  CREATININE 0.9 1.2* 1.0    Recent Labs  04/27/15 08/27/15 10/29/15  AST 17 88* 24  ALT 14 100* 15  ALKPHOS 101 120 79    Recent Labs  06/02/15 1455 08/27/15 10/29/15  WBC 7.7 9.3 6.7  NEUTROABS 5.2  --   --   HGB 16.5* 17.8* 15.2  HCT 48.9* 50* 45  MCV 98.2  --   --   PLT 216 218 171   Lab Results  Component Value Date   TSH 2.37 08/27/2015   Lab Results  Component Value Date   HGBA1C 8.1 10/29/2015   Lab Results  Component Value Date   CHOL  10/22/2010    100        ATP III CLASSIFICATION:  <200     mg/dL   Desirable  200-239  mg/dL   Borderline High  >=240    mg/dL   High          HDL 55 10/22/2010   LDLCALC  10/22/2010    29        Total Cholesterol/HDL:CHD Risk Coronary Heart Disease Risk Table                     Men   Women  1/2 Average Risk   3.4   3.3  Average Risk       5.0   4.4  2 X Average Risk   9.6   7.1  3 X Average Risk  23.4   11.0        Use the calculated Patient Ratio above and the CHD Risk Table to determine the patient's CHD Risk.        ATP III CLASSIFICATION (LDL):  <100     mg/dL   Optimal   100-129  mg/dL   Near or Above                    Optimal  130-159  mg/dL   Borderline  160-189  mg/dL   High  >190     mg/dL   Very High   TRIG 80 10/22/2010   CHOLHDL 1.8 10/22/2010    Significant Diagnostic Results in last 30 days:  No results found.  Assessment/Plan A-fib 02/03/14 doing well, EKG A-fib with controlled VR.  Heart rate is in control, continue Propranolol.   Back pain Chronic issue, pain is controlled, decrease Fentanyl 3mcg/hr after 138mcg/hr exhausted.   Constipation Stable, continue colace 100 II qhs, Linzess 151mcg, MiraLax prn  Controlled type 2 DM with peripheral circulatory disorder ([facility]) 10/29/15 Hgb A1c 8.1. Chronic CAD, PVD, continue Lantus 14u daily, novolog 10u with meals, addition 2 u if CBG>300  Dementia with behavioral disturbance Gradual decline. Continue Namenda 28mg  daily to preserve memory. Increased confusion w/o new focal neurological symptoms, update CBC, CMP, TSH  Depression Stable, continue Cymbalta 30mg  since 06/29/15  Edema Chronic venous insufficiency. Continue Furosemide 40mg  bid, Spironolactone25mg  daily. Trace edema BLE, update CMP  Essential tremor Mild head titubation and hands tremor-not disabling, continue Propranolol 10mg  bid  GERD (gastroesophageal reflux disease) Asymptomatic, continue Famotidine 20mg   HTN (hypertension) Controlled, continue Losartan 25mg  daily, Propranolol 20mg  bid, Furosemide 40mg  bid, Spironolactone 25mg , Imdur 60mg   Hypothyroidism Continue Levothyroxine 112.67mcg Sat, 60mcg M, T, W, Thr, F, and Sun, last TSH 08/27/15 2.365, update TSH  Type 2 diabetes mellitus with diabetic neuropathy affecting both sides of body ([facility]) 10/29/15 Hgb A1c 8.1. Chronic CAD, PVD Continue Lantus 14u, Novolog 10u with meals, additional 2u if CBG>300  FTT (failure to thrive) in adult Supportive care, continue comfort care with Fentanyl 78mcg/hr, dc Vit D, Loratadine, Certavite, and Glucosamine-questionable benefit.       Family/ staff Communication: continue SNF for care needs.   Labs/tests ordered:  CBC, CMP, TSH

## 2016-01-26 NOTE — Assessment & Plan Note (Signed)
Mild head titubation and hands tremor-not disabling, continue Propranolol 10mg bid 

## 2016-01-28 LAB — CBC AND DIFFERENTIAL
HCT: 47 % — AB (ref 36–46)
Hemoglobin: 15.6 g/dL (ref 12.0–16.0)
Platelets: 277 10*3/uL (ref 150–399)
WBC: 7 10^3/mL

## 2016-01-28 LAB — HEPATIC FUNCTION PANEL
ALK PHOS: 119 U/L (ref 25–125)
ALT: 13 U/L (ref 7–35)
AST: 28 U/L (ref 13–35)
Bilirubin, Total: 0.5 mg/dL

## 2016-01-28 LAB — BASIC METABOLIC PANEL
BUN: 42 mg/dL — AB (ref 4–21)
Creatinine: 0.9 mg/dL (ref 0.5–1.1)
GLUCOSE: 163 mg/dL
POTASSIUM: 4.8 mmol/L (ref 3.4–5.3)
SODIUM: 141 mmol/L (ref 137–147)

## 2016-01-28 LAB — TSH: TSH: 3.95 u[IU]/mL (ref 0.41–5.90)

## 2016-02-23 ENCOUNTER — Non-Acute Institutional Stay (SKILLED_NURSING_FACILITY): Payer: Medicare Other | Admitting: Nurse Practitioner

## 2016-02-23 ENCOUNTER — Encounter: Payer: Self-pay | Admitting: Nurse Practitioner

## 2016-02-23 DIAGNOSIS — I480 Paroxysmal atrial fibrillation: Secondary | ICD-10-CM | POA: Diagnosis not present

## 2016-02-23 DIAGNOSIS — F03918 Unspecified dementia, unspecified severity, with other behavioral disturbance: Secondary | ICD-10-CM

## 2016-02-23 DIAGNOSIS — F32A Depression, unspecified: Secondary | ICD-10-CM

## 2016-02-23 DIAGNOSIS — E1151 Type 2 diabetes mellitus with diabetic peripheral angiopathy without gangrene: Secondary | ICD-10-CM | POA: Diagnosis not present

## 2016-02-23 DIAGNOSIS — I10 Essential (primary) hypertension: Secondary | ICD-10-CM | POA: Diagnosis not present

## 2016-02-23 DIAGNOSIS — F329 Major depressive disorder, single episode, unspecified: Secondary | ICD-10-CM

## 2016-02-23 DIAGNOSIS — E039 Hypothyroidism, unspecified: Secondary | ICD-10-CM

## 2016-02-23 DIAGNOSIS — R609 Edema, unspecified: Secondary | ICD-10-CM | POA: Diagnosis not present

## 2016-02-23 DIAGNOSIS — K219 Gastro-esophageal reflux disease without esophagitis: Secondary | ICD-10-CM | POA: Diagnosis not present

## 2016-02-23 DIAGNOSIS — R748 Abnormal levels of other serum enzymes: Secondary | ICD-10-CM

## 2016-02-23 DIAGNOSIS — G250 Essential tremor: Secondary | ICD-10-CM

## 2016-02-23 DIAGNOSIS — K5900 Constipation, unspecified: Secondary | ICD-10-CM

## 2016-02-23 DIAGNOSIS — E1142 Type 2 diabetes mellitus with diabetic polyneuropathy: Secondary | ICD-10-CM

## 2016-02-23 DIAGNOSIS — F0391 Unspecified dementia with behavioral disturbance: Secondary | ICD-10-CM

## 2016-02-23 DIAGNOSIS — M5440 Lumbago with sciatica, unspecified side: Secondary | ICD-10-CM

## 2016-02-23 DIAGNOSIS — R627 Adult failure to thrive: Secondary | ICD-10-CM

## 2016-02-23 DIAGNOSIS — D751 Secondary polycythemia: Secondary | ICD-10-CM

## 2016-02-23 NOTE — Assessment & Plan Note (Signed)
Stable, continue Cymbalta 30mg since 06/29/15. 

## 2016-02-23 NOTE — Progress Notes (Signed)
Patient ID: [name], female   DOB: Mar 25, 1926, 82 y.o.   MRN: [id number]  Location:  [facility] Room Number: N 2 Place of Service:  SNF (31) Provider: Lennie Odor Tanner Yeley NP  Estill Dooms, MD  Patient Care Team: Estill Dooms, MD as PCP - General (Internal Medicine) Darlin Coco, MD (Cardiology) Arloa Koh, MD (Radiation Oncology) Neldon Mc, MD (General Surgery)  Extended Emergency Contact Information Primary Emergency Contact: Voight,Clifton B Address: [address]          [place], [place] [address] [place] Phone: [phone] Mobile Phone: [phone] Relation: Spouse Secondary Emergency Contact: Dorna Leitz Address: [place], [place] [address] [name] Phone: [phone] Mobile Phone: [phone] Relation: Daughter  Code Status:  DNR Goals of care: Advanced Directive information Advanced Directives 02/23/2016  Does patient have an advance directive? Yes  Type of Paramedic of [place];Out of facility DNR (pink MOST or yellow form)  Does patient want to make changes to advanced directive? No - Patient declined  Copy of advanced directive(s) in chart? Yes     Chief Complaint  Patient presents with  . Medical Management of Chronic Issues    Routine Visit    HPI:  Pt is a 82 y.o. female seen today for medical management of chronic diseases.  HTN, controlled while on Losartan 6m, Imdur 629m Furosemide 4015mid, Spironolactone 2m1mropranolol 2mg79m,  Depression, stable on Cymbalta 30mg 28my, dementia, gradual declined, more bed rest, no longer beneficial of Namenda, blood sugar, takes Lantus 14u daily, 10 u with meals for CBG>100,  pressure ulcers at the R+L intergluteal cleft, stage II, slow healing. Hypothyroidism, last TSH wnl 10/2015, taking Levothyroxine 75mg d12m except Sat 112mcg. 52mnic pain in the lower back, taking Fentanyl  75mcg/hr66mw less pain complained since she is more bedrest. Constipation, stable while on Liness 145mcg dai52mnd Colace 200mg hs. G57mstable on Famotidine 20mg daily.91mPast Medical History  Diagnosis Date  . Diabetes mellitus   . Hypertension   . Heart murmur   . Benign familial tremor   . Rosacea   . Diverticulitis   . Heart failure     diastolic heart failure  . TIA (transient ischemic attack) 2010  . Arthritis   . Breast cancer (HCC)     [place]  . CAD (coronary artery disease)     single vessel CAD per cath in 2006; scattered nonobstructive disease in the left system, left to right collaterals to the RCA which was occluded by flush shots; she has been managed medically  . Polycythemia 06/05/2015    Normal erythropoietin; O2 sat 95% room air    Past Surgical History  Procedure Laterality Date  . Mastectomy partial / lumpectomy  11/02/2000    Right, with SLN  . Mastectomy partial / lumpectomy  05/15/2008    Left  . Breast lumpectomy  01/19/2010    right  . Mastectomy  03/31/2010    Right    Allergies  Allergen Reactions  . Penicillins Other (See Comments)    Per MAR  . Bee Venom Other (See Comments)    Per MAR  . Celebrex [Celecoxib] Other (See Comments)    Per MAR  . Lidocaine Hcl Other (See Comments)    Per MAR  . Phenobarbital Other (See Comments)    Per MAR  .  Procaine Hcl Other (See Comments)    Per mar      Medication List       This list is accurate as of: 02/23/16  2:16 PM.  Always use your most recent med list.               acetaminophen 325 MG tablet  Commonly known as:  TYLENOL  Take 2 tablets (650 mg total) by mouth every 4 (four) hours as needed.     ALIGN 4 MG Caps  Take 1 capsule by mouth daily.     aspirin EC 81 MG tablet  Take 81 mg by mouth daily.     DIABETIC TUSSIN EX 100 MG/5ML liquid  Generic drug:  guaiFENesin  Take 15 mLs by mouth every 8 (eight) hours as needed for cough.     docusate sodium 100 MG capsule    Commonly known as:  COLACE  Take 200 mg by mouth at bedtime.     dorzolamide-timolol 22.3-6.8 MG/ML ophthalmic solution  Commonly known as:  COSOPT  Place 1 drop into the left eye every 12 (twelve) hours.     DULoxetine 30 MG capsule  Commonly known as:  CYMBALTA  Take 30 mg by mouth daily.     famotidine 20 MG tablet  Commonly known as:  PEPCID  Take 20 mg by mouth at bedtime.     feeding supplement (PRO-STAT SUGAR FREE 64) Liqd  Take 30 mLs by mouth daily at 8 pm.     feeding supplement Liqd  Take 1 Container by mouth 3 (three) times daily between meals.     fentaNYL 75 MCG/HR  Commonly known as:  DURAGESIC - dosed mcg/hr  Place 75 mcg onto the skin every 3 (three) days.     furosemide 40 MG tablet  Commonly known as:  LASIX  Take 40 mg by mouth 2 (two) times daily.     GLUCAGEN 1 MG Solr injection  Generic drug:  glucagon  Inject 1 mg into the vein once as needed for low blood sugar.     HUMALOG 100 UNIT/ML injection  Generic drug:  insulin lispro  Inject 10 Units into the skin 3 (three) times daily after meals. For CBG>100 give additional 2 units for CBG>300     insulin glargine 100 UNIT/ML injection  Commonly known as:  LANTUS  Inject 14 Units into the skin at bedtime.     isosorbide mononitrate 60 MG 24 hr tablet  Commonly known as:  IMDUR  Take 60 mg by mouth every morning.     LACRI-LUBE OP  Apply to eye. Apply to both eyes twice daily     levothyroxine 75 MCG tablet  Commonly known as:  SYNTHROID, LEVOTHROID  Take 75-112.5 mcg by mouth daily. Take 1 and 1/2 tablets on Saturdays then 1 tablet all other days     LINZESS 145 MCG Caps capsule  Generic drug:  Linaclotide  Take 145 mcg by mouth daily. Take 30 mins prior to breakfast on an empty stomach.     losartan 25 MG tablet  Commonly known as:  COZAAR  Take 25 mg by mouth daily.     methylcellulose 1 % ophthalmic solution  Commonly known as:  ARTIFICIAL TEARS  Place 1 drop into both eyes 4  (four) times daily as needed (for dryness).     NAMENDA XR 28 MG Cp24 24 hr capsule  Generic drug:  memantine  Take 28 mg by mouth daily.  nitroGLYCERIN 0.4 MG SL tablet  Commonly known as:  NITROSTAT  Place 0.4 mg under the tongue every 5 (five) minutes as needed. For chest pain     propranolol 20 MG tablet  Commonly known as:  INDERAL  Take 20 mg by mouth 2 (two) times daily.     spironolactone 25 MG tablet  Commonly known as:  ALDACTONE  Take 25 mg by mouth daily.     UNABLE TO FIND  Med Name: Magic Cup with lunch and dinner        Review of Systems  Constitutional: Positive for activity change, appetite change and fatigue. Negative for fever, chills and diaphoresis.       C/o aches allover  HENT: Positive for hearing loss. Negative for congestion, ear discharge and ear pain.   Eyes: Negative for pain, discharge and redness.  Respiratory: Negative for cough, shortness of breath and wheezing.   Cardiovascular: Positive for leg swelling. Negative for chest pain and palpitations.  Gastrointestinal: Negative for nausea, vomiting, abdominal pain and constipation.  Endocrine: Negative for polydipsia.  Genitourinary: Positive for frequency. Negative for dysuria and urgency.       Recurrent urinary tract infections  Musculoskeletal: Positive for back pain. Negative for myalgias and neck pain.  Skin: Negative for rash.       Long standing excoriated buttocks with pressure ulcers L+R buttocks, pressure ulcers at the R+L intergluteal cleft, stage II, healthy wound beds, small amount of bleeding, no s/s of infection.    Neurological: Negative for dizziness, tremors, seizures, weakness and headaches.  Psychiatric/Behavioral: Negative for suicidal ideas and hallucinations. The patient is nervous/anxious.        More confused    Immunization History  Administered Date(s) Administered  . Influenza Whole 09/04/2013  . Influenza-Unspecified 08/21/2014, 08/21/2015  . PPD Test  04/26/2010   Pertinent  Health Maintenance Due  Topic Date Due  . OPHTHALMOLOGY EXAM  10/15/1938  . DEXA SCAN  10/15/1993  . PNA vac Low Risk Adult (1 of 2 - PCV13) 10/15/1993  . FOOT EXAM  02/05/2016  . HEMOGLOBIN A1C  04/28/2016  . INFLUENZA VACCINE  06/14/2016   Fall Risk  06/02/2015 02/05/2015 11/04/2014  Falls in the past year? No No Yes  Number falls in past yr: - - 2 or more  Risk for fall due to : - Impaired balance/gait History of fall(s);Impaired balance/gait;Impaired mobility   Functional Status Survey:    Filed Vitals:   02/23/16 0937  BP: 130/87  Pulse: 64  Temp: 98.3 F (36.8 C)  TempSrc: Oral  Resp: 16  Height: _0  (1.6 m)  Weight: 186 lb (84.369 kg)  SpO2: 95%   Body mass index is 32.96 kg/(m^2). Physical Exam  Constitutional: She appears well-developed.  HENT:  Head: Normocephalic and atraumatic.  Left Ear: External ear normal.  Eyes: Conjunctivae and EOM are normal. Pupils are equal, round, and reactive to light.  Neck: Neck supple. No JVD present. No thyromegaly present.  Cardiovascular: Normal rate and regular rhythm.   Murmur heard. Pulmonary/Chest: Effort normal. She has rales.  Bibasilar rales.   Abdominal: Soft. There is no tenderness. There is no rebound.  Musculoskeletal: She exhibits edema and tenderness.  Back pain is chronic which is well controlled. BLE chronic edema trace only. Left foot external deviated deformity.   Lymphadenopathy:    She has no cervical adenopathy.  Neurological: She is alert. No cranial nerve deficit. Coordination normal.  Skin: Skin is warm and dry. Rash noted.  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 R+L   Small open areas lateral left lower leg, anterior right lower leg. Pressure ulcers at the R+L intergluteal cleft, stage II, healthy wound beds, small amount of bleeding, no s/s of infection.    Psychiatric: Her mood appears anxious. Her affect is not blunt, not labile and not inappropriate. Her speech is rapid  and/or pressured (occasionally ). Her speech is not delayed and not slurred. She is hyperactive. She is not agitated, not aggressive, not slowed, not withdrawn, not actively hallucinating and not combative. Thought content is not paranoid and not delusional. She does not exhibit a depressed mood. She exhibits abnormal recent memory.  MMSE 21/30 08/2013    Labs reviewed:  Recent Labs  08/27/15 10/29/15 01/28/16  NA 137 140 141  K 4.1 3.8 4.8  BUN 36* 35* 42*  CREATININE 1.2* 1.0 0.9    Recent Labs  08/27/15 10/29/15 01/28/16  AST 88* 24 28  ALT 100* 15 13  ALKPHOS 120 79 119    Recent Labs  06/02/15 1455 08/27/15 10/29/15 01/28/16  WBC 7.7 9.3 6.7 7.0  NEUTROABS 5.2  --   --   --   HGB 16.5* 17.8* 15.2 15.6  HCT 48.9* 50* 45 47*  MCV 98.2  --   --   --   PLT 216 218 171 277   Lab Results  Component Value Date   TSH 3.95 01/28/2016   Lab Results  Component Value Date   HGBA1C 8.1 10/29/2015   Lab Results  Component Value Date   CHOL  10/22/2010    100        ATP III CLASSIFICATION:  <200     mg/dL   Desirable  200-239  mg/dL   Borderline High  >=240    mg/dL   High          HDL 55 10/22/2010   LDLCALC  10/22/2010    29        Total Cholesterol/HDL:CHD Risk Coronary Heart Disease Risk Table                     Men   Women  1/2 Average Risk   3.4   3.3  Average Risk       5.0   4.4  2 X Average Risk   9.6   7.1  3 X Average Risk  23.4   11.0        Use the calculated Patient Ratio above and the CHD Risk Table to determine the patient's CHD Risk.        ATP III CLASSIFICATION (LDL):  <100     mg/dL   Optimal  100-129  mg/dL   Near or Above                    Optimal  130-159  mg/dL   Borderline  160-189  mg/dL   High  >190     mg/dL   Very High   TRIG 80 10/22/2010   CHOLHDL 1.8 10/22/2010    Significant Diagnostic Results in last 30 days:  No results found.  Assessment/Plan A-fib 02/03/14 doing well, EKG A-fib with controlled VR.  Heart rate  is in control, continue Propranolol.   Back pain Chronic issue, pain is controlled, decrease Fentanyl 86mg/hr after current supplies exhausted.   Constipation Stable, continue colace 100 II qhs, Linzess 1429m, MiraLax prn  Controlled type 2 DM with peripheral circulatory disorder ([place]16 Hgb  A1c 8.1. Chronic CAD, PVD, continue Lantus 14u daily, novolog 10u with meals, addition 2 u if CBG>300   Dementia with behavioral disturbance Gradual decline. Increased confusion w/o new focal neurological symptoms, dc Namenda, R>B   Depression Stable, continue Cymbalta 15m since 06/29/15  Edema Chronic venous insufficiency. Continue Furosemide 438mbid, Spironolactone2533maily. Trace edema BLE  Essential tremor Mild head titubation and hands tremor-not disabling, continue Propranolol 77m38md  FTT (failure to thrive) in adult Supportive care, continue comfort care with Fentanyl 50mc46m, dc Namenda, ASA, Align R>B  GERD (gastroesophageal reflux disease) Asymptomatic, continue Famotidine 20mg 77mN (hypertension) Controlled, continue Losartan 25mg d77m, Propranolol 20mg bi42murosemide 40mg bid70mironolactone 25mg, Imd48m0mg  Hypo63moidism Continue Levothyroxine 112.5mcg Sat, 761mg M, T, 46mhr, F, and Sun, last TSH 3.95 01/28/16   Liver enzyme elevation 01/28/16 AST 28, ALT 13, alk phos 119   Polycythemia 01/28/16 Hgb 15.6, red 4.89  Type 2 diabetes mellitus with diabetic neuropathy affecting both sides of body (HCC) [place] A1[place] 8.1. Chronic CAD, PVD Continue Lantus 14u, Novolog 10u with meals, additional 2u if CBG>300    Family/ staff Communication: continue SNF for care needs.   Labs/tests ordered: none

## 2016-02-23 NOTE — Assessment & Plan Note (Signed)
Asymptomatic, continue Famotidine 20mg 

## 2016-02-23 NOTE — Assessment & Plan Note (Signed)
10/29/15 Hgb A1c 8.1. Chronic CAD, PVD Continue Lantus 14u, Novolog 10u with meals, additional 2u if CBG>300

## 2016-02-23 NOTE — Assessment & Plan Note (Signed)
Stable, continue colace 100 II qhs, Linzess 145mcg, MiraLax prn. 

## 2016-02-23 NOTE — Assessment & Plan Note (Signed)
Gradual decline. Increased confusion w/o new focal neurological symptoms, dc Namenda, R>B

## 2016-02-23 NOTE — Assessment & Plan Note (Signed)
01/28/16 Hgb 15.6, red 4.89

## 2016-02-23 NOTE — Assessment & Plan Note (Signed)
Chronic issue, pain is controlled, decrease Fentanyl 33mcg/hr after current supplies exhausted.

## 2016-02-23 NOTE — Assessment & Plan Note (Signed)
02/03/14 doing well, EKG A-fib with controlled VR.  Heart rate is in control, continue Propranolol.

## 2016-02-23 NOTE — Assessment & Plan Note (Signed)
10/29/15 Hgb A1c 8.1. Chronic CAD, PVD, continue Lantus 14u daily, novolog 10u with meals, addition 2 u if CBG>300

## 2016-02-23 NOTE — Assessment & Plan Note (Signed)
Chronic venous insufficiency. Continue Furosemide 40mg  bid, Spironolactone25mg  daily. Trace edema BLE

## 2016-02-23 NOTE — Assessment & Plan Note (Signed)
Controlled, continue Losartan 25mg  daily, Propranolol 20mg  bid, Furosemide 40mg  bid, Spironolactone 25mg , Imdur 60mg 

## 2016-02-23 NOTE — Assessment & Plan Note (Signed)
Mild head titubation and hands tremor-not disabling, continue Propranolol 10mg bid 

## 2016-02-23 NOTE — Assessment & Plan Note (Signed)
Continue Levothyroxine 112.30mcg Sat, 18mcg M, T, W, Thr, F, and Sun, last TSH 3.95 01/28/16

## 2016-02-23 NOTE — Assessment & Plan Note (Signed)
01/28/16 AST 28, ALT 13, alk phos 119

## 2016-02-23 NOTE — Assessment & Plan Note (Signed)
Supportive care, continue comfort care with Fentanyl 8mcg/hr, dc Namenda, ASA, Align R>B

## 2016-03-15 ENCOUNTER — Non-Acute Institutional Stay (SKILLED_NURSING_FACILITY): Payer: Medicare Other | Admitting: Nurse Practitioner

## 2016-03-15 ENCOUNTER — Encounter: Payer: Self-pay | Admitting: Nurse Practitioner

## 2016-03-15 DIAGNOSIS — K5900 Constipation, unspecified: Secondary | ICD-10-CM

## 2016-03-15 DIAGNOSIS — F329 Major depressive disorder, single episode, unspecified: Secondary | ICD-10-CM | POA: Diagnosis not present

## 2016-03-15 DIAGNOSIS — F0391 Unspecified dementia with behavioral disturbance: Secondary | ICD-10-CM | POA: Diagnosis not present

## 2016-03-15 DIAGNOSIS — F03918 Unspecified dementia, unspecified severity, with other behavioral disturbance: Secondary | ICD-10-CM

## 2016-03-15 DIAGNOSIS — R609 Edema, unspecified: Secondary | ICD-10-CM | POA: Diagnosis not present

## 2016-03-15 DIAGNOSIS — G250 Essential tremor: Secondary | ICD-10-CM | POA: Diagnosis not present

## 2016-03-15 DIAGNOSIS — I10 Essential (primary) hypertension: Secondary | ICD-10-CM

## 2016-03-15 DIAGNOSIS — R627 Adult failure to thrive: Secondary | ICD-10-CM

## 2016-03-15 DIAGNOSIS — E1151 Type 2 diabetes mellitus with diabetic peripheral angiopathy without gangrene: Secondary | ICD-10-CM

## 2016-03-15 DIAGNOSIS — I482 Chronic atrial fibrillation, unspecified: Secondary | ICD-10-CM

## 2016-03-15 DIAGNOSIS — K219 Gastro-esophageal reflux disease without esophagitis: Secondary | ICD-10-CM | POA: Diagnosis not present

## 2016-03-15 DIAGNOSIS — E039 Hypothyroidism, unspecified: Secondary | ICD-10-CM | POA: Diagnosis not present

## 2016-03-15 DIAGNOSIS — D751 Secondary polycythemia: Secondary | ICD-10-CM

## 2016-03-15 DIAGNOSIS — E1142 Type 2 diabetes mellitus with diabetic polyneuropathy: Secondary | ICD-10-CM

## 2016-03-15 DIAGNOSIS — M5440 Lumbago with sciatica, unspecified side: Secondary | ICD-10-CM | POA: Diagnosis not present

## 2016-03-15 DIAGNOSIS — F32A Depression, unspecified: Secondary | ICD-10-CM

## 2016-03-15 DIAGNOSIS — R748 Abnormal levels of other serum enzymes: Secondary | ICD-10-CM

## 2016-03-15 NOTE — Assessment & Plan Note (Signed)
Continue Levothyroxine 112.89mcg Sat, 43mcg M, T, W, Thr, F, and Sun, last TSH 3.95 01/28/16

## 2016-03-15 NOTE — Assessment & Plan Note (Signed)
Stable, continue Cymbalta 30mg since 06/29/15. 

## 2016-03-15 NOTE — Progress Notes (Signed)
Patient ID: [name], female   DOB: 25-May-1926, 82 y.o.   MRN: [id number]  Location:  [facility] Room Number: N 2 Place of Service:  SNF (31) Provider: Lennie Odor Mast NP  Estill Dooms, MD  Patient Care Team: Estill Dooms, MD as PCP - General (Internal Medicine) Darlin Coco, MD (Cardiology) Arloa Koh, MD (Radiation Oncology) Neldon Mc, MD (General Surgery)  Extended Emergency Contact Information Primary Emergency Contact: Charnley,Clifton B Address: [address]          [place], [place] [address] [place] Phone: [phone] Mobile Phone: [phone] Relation: Spouse Secondary Emergency Contact: Dorna Leitz Address: [place], [place] [address] [name] Phone: [phone] Mobile Phone: [phone] Relation: Daughter  Code Status:  DNR Goals of care: Advanced Directive information Advanced Directives 03/15/2016  Does patient have an advance directive? Yes  Type of [place] of [place];Out of facility DNR (pink MOST or yellow form)  Does patient want to make changes to advanced directive? No - Patient declined  Copy of advanced directive(s) in chart? Yes     Chief Complaint  Patient presents with  . Altered Mental Status    confusion    HPI:  Pt is a 82 y.o. female seen today for medical management of chronic diseases.  HTN, controlled while on Losartan 103m, Imdur 627m Furosemide 4052mid, Spironolactone 44m57mropranolol 44mg55m,  Depression, stable on Cymbalta 30mg 52my, dementia, gradual declined, more bed rest, no longer beneficial of Namenda, blood sugar, takes Lantus 14u daily, 10 u with meals for CBG>100,  pressure ulcers at the R+L intergluteal cleft, stage II, slow healing. Hypothyroidism, last TSH wnl 10/2015, taking Levothyroxine 75mg d60m except Sat 112mcg. 33mnic pain in the lower back, taking Fentanyl 75mcg/hr2mw less  pain complained since she is more bedrest. Constipation, stable while on Liness 145mcg dai72mnd Colace 200mg hs. G51mstable on Famotidine 20mg daily.55mStaff reported the patient's increased agitation and confusion in pm and during personal care. The patient has declined cognitively and physically, she is nearly bedrest, stated she wants to go to sleep, said goodbye to me.    Past Medical History  Diagnosis Date  . Diabetes mellitus   . Hypertension   . Heart murmur   . Benign familial tremor   . Rosacea   . Diverticulitis   . Heart failure     diastolic heart failure  . TIA (transient ischemic attack) 2010  . Arthritis   . Breast cancer (HCC)     brePreston -rt  . CAD (coronary artery disease)     single vessel CAD per cath in 2006; scattered nonobstructive disease in the left system, left to right collaterals to the RCA which was occluded by flush shots; she has been managed medically  . Polycythemia 06/05/2015    Normal erythropoietin; O2 sat 95% room air    Past Surgical History  Procedure Laterality Date  . Mastectomy partial / lumpectomy  11/02/2000    Right, with SLN  . Mastectomy partial / lumpectomy  05/15/2008    Left  . Breast lumpectomy  01/19/2010    right  . Mastectomy  03/31/2010    Right    Allergies  Allergen Reactions  . Penicillins Other (See Comments)    Per MAR  . Bee Venom Other (See Comments)    Per MAR  .  Celebrex [Celecoxib] Other (See Comments)    Per MAR  . Lidocaine Hcl Other (See Comments)    Per MAR  . Phenobarbital Other (See Comments)    Per MAR  . Procaine Hcl Other (See Comments)    Per mar      Medication List       This list is accurate as of: 03/15/16  4:55 PM.  Always use your most recent med list.               acetaminophen 325 MG tablet  Commonly known as:  TYLENOL  Take 2 tablets (650 mg total) by mouth every 4 (four) hours as needed.     DIABETIC TUSSIN EX 100 MG/5ML liquid  Generic drug:  guaiFENesin  Take 15 mLs  by mouth every 8 (eight) hours as needed for cough.     docusate sodium 100 MG capsule  Commonly known as:  COLACE  Take 200 mg by mouth at bedtime.     dorzolamide-timolol 22.3-6.8 MG/ML ophthalmic solution  Commonly known as:  COSOPT  Place 1 drop into the left eye every 12 (twelve) hours.     DULoxetine 30 MG capsule  Commonly known as:  CYMBALTA  Take 30 mg by mouth daily.     famotidine 20 MG tablet  Commonly known as:  PEPCID  Take 20 mg by mouth at bedtime.     feeding supplement (PRO-STAT SUGAR FREE 64) Liqd  Take 30 mLs by mouth daily at 8 pm.     feeding supplement Liqd  Take 1 Container by mouth 3 (three) times daily between meals.     fentaNYL 75 MCG/HR  Commonly known as:  DURAGESIC - dosed mcg/hr  Place 75 mcg onto the skin every 3 (three) days.     furosemide 40 MG tablet  Commonly known as:  LASIX  Take 40 mg by mouth 2 (two) times daily.     GLUCAGEN 1 MG Solr injection  Generic drug:  glucagon  Inject 1 mg into the vein once as needed for low blood sugar.     HUMALOG 100 UNIT/ML injection  Generic drug:  insulin lispro  Inject 10 Units into the skin 3 (three) times daily after meals. For CBG>100 give additional 2 units for CBG>300     insulin glargine 100 UNIT/ML injection  Commonly known as:  LANTUS  Inject 14 Units into the skin at bedtime.     isosorbide mononitrate 60 MG 24 hr tablet  Commonly known as:  IMDUR  Take 60 mg by mouth every morning.     LACRI-LUBE OP  Apply to eye. Apply to both eyes twice daily     levothyroxine 75 MCG tablet  Commonly known as:  SYNTHROID, LEVOTHROID  Take 75-112.5 mcg by mouth daily. Take 1 and 1/2 tablets on Saturdays then 1 tablet all other days     LINZESS 145 MCG Caps capsule  Generic drug:  linaclotide  Take 145 mcg by mouth daily. Take 30 mins prior to breakfast on an empty stomach.     losartan 25 MG tablet  Commonly known as:  COZAAR  Take 25 mg by mouth daily.     nitroGLYCERIN 0.4 MG SL  tablet  Commonly known as:  NITROSTAT  Place 0.4 mg under the tongue every 5 (five) minutes as needed. For chest pain     polyvinyl alcohol 1.4 % ophthalmic solution  Commonly known as:  LIQUIFILM TEARS  Place 1 drop into both eyes  4 (four) times daily as needed for dry eyes.     propranolol 20 MG tablet  Commonly known as:  INDERAL  Take 20 mg by mouth 2 (two) times daily.     spironolactone 25 MG tablet  Commonly known as:  ALDACTONE  Take 25 mg by mouth daily.     UNABLE TO FIND  Med Name: Magic Cup with lunch and dinner        Review of Systems  Constitutional: Positive for activity change, appetite change and fatigue. Negative for fever, chills and diaphoresis.       C/o aches allover  HENT: Positive for hearing loss. Negative for congestion, ear discharge and ear pain.   Eyes: Negative for pain, discharge and redness.  Respiratory: Negative for cough, shortness of breath and wheezing.   Cardiovascular: Positive for leg swelling. Negative for chest pain and palpitations.  Gastrointestinal: Negative for nausea, vomiting, abdominal pain and constipation.  Endocrine: Negative for polydipsia.  Genitourinary: Positive for frequency. Negative for dysuria and urgency.       Recurrent urinary tract infections  Musculoskeletal: Positive for back pain. Negative for myalgias and neck pain.  Skin: Negative for rash.       Long standing excoriated buttocks with pressure ulcers L+R buttocks, pressure ulcers at the R+L intergluteal cleft, stage II, healthy wound beds, small amount of bleeding, no s/s of infection.    Neurological: Negative for dizziness, tremors, seizures, weakness and headaches.  Psychiatric/Behavioral: Negative for suicidal ideas and hallucinations. The patient is nervous/anxious.        More confused    Immunization History  Administered Date(s) Administered  . Influenza Whole 09/04/2013  . Influenza-Unspecified 08/21/2014, 08/21/2015  . PPD Test 04/26/2010    Pertinent  Health Maintenance Due  Topic Date Due  . OPHTHALMOLOGY EXAM  10/15/1938  . DEXA SCAN  10/15/1993  . PNA vac Low Risk Adult (1 of 2 - PCV13) 10/15/1993  . FOOT EXAM  02/05/2016  . HEMOGLOBIN A1C  04/28/2016  . INFLUENZA VACCINE  06/14/2016   Fall Risk  06/02/2015 02/05/2015 11/04/2014  Falls in the past year? No No Yes  Number falls in past yr: - - 2 or more  Risk for fall due to : - Impaired balance/gait History of fall(s);Impaired balance/gait;Impaired mobility   Functional Status Survey:    Filed Vitals:   03/15/16 1228  BP: 130/78  Pulse: 86  Temp: 97 F (36.1 C)  TempSrc: Oral  Resp: 20  Height: _0  (1.6 m)  Weight: 186 lb (84.369 kg)   Body mass index is 32.96 kg/(m^2). Physical Exam  Constitutional: She appears well-developed.  HENT:  Head: Normocephalic and atraumatic.  Left Ear: External ear normal.  Eyes: Conjunctivae and EOM are normal. Pupils are equal, round, and reactive to light.  Neck: Neck supple. No JVD present. No thyromegaly present.  Cardiovascular: Normal rate and regular rhythm.   Murmur heard. Pulmonary/Chest: [place] normal. She has rales.  Bibasilar rales.   Abdominal: Soft. There is no tenderness. There is no rebound.  Musculoskeletal: She exhibits edema and tenderness.  Back pain is chronic which is well controlled. BLE chronic edema trace only. Left foot external deviated deformity.   Lymphadenopathy:    She has no cervical adenopathy.  Neurological: She is alert. No cranial nerve deficit. Coordination normal.  Skin: Skin is warm and dry. Rash noted.  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 R+L   Small open areas lateral left lower leg, anterior right lower leg. Pressure  ulcers at the R+L intergluteal cleft, stage II, healthy wound beds, small amount of bleeding, no s/s of infection.    Psychiatric: Her mood appears anxious. Her affect is not blunt, not labile and not inappropriate. Her speech is rapid and/or pressured  (occasionally ). Her speech is not delayed and not slurred. She is hyperactive. She is not agitated, not aggressive, not slowed, not withdrawn, not actively hallucinating and not combative. Thought content is not paranoid and not delusional. She does not exhibit a depressed mood. She exhibits abnormal recent memory.  MMSE 21/30 08/2013    Labs reviewed:  Recent Labs  08/27/15 10/29/15 01/28/16  NA 137 140 141  K 4.1 3.8 4.8  BUN 36* 35* 42*  CREATININE 1.2* 1.0 0.9    Recent Labs  08/27/15 10/29/15 01/28/16  AST 88* 24 28  ALT 100* 15 13  ALKPHOS 120 79 119    Recent Labs  06/02/15 1455 08/27/15 10/29/15 01/28/16  WBC 7.7 9.3 6.7 7.0  NEUTROABS 5.2  --   --   --   HGB 16.5* 17.8* 15.2 15.6  HCT 48.9* 50* 45 47*  MCV 98.2  --   --   --   PLT 216 218 171 277   Lab Results  Component Value Date   TSH 3.95 01/28/2016   Lab Results  Component Value Date   HGBA1C 8.1 10/29/2015   Lab Results  Component Value Date   CHOL  10/22/2010    100        ATP III CLASSIFICATION:  <200     mg/dL   Desirable  200-239  mg/dL   Borderline High  >=240    mg/dL   High          HDL 55 10/22/2010   LDLCALC  10/22/2010    29        Total Cholesterol/HDL:CHD Risk Coronary Heart Disease Risk Table                     Men   Women  1/2 Average Risk   3.4   3.3  Average Risk       5.0   4.4  2 X Average Risk   9.6   7.1  3 X Average Risk  23.4   11.0        Use the calculated Patient Ratio above and the CHD Risk Table to determine the patient's CHD Risk.        ATP III CLASSIFICATION (LDL):  <100     mg/dL   Optimal  100-129  mg/dL   Near or Above                    Optimal  130-159  mg/dL   Borderline  160-189  mg/dL   High  >190     mg/dL   Very High   TRIG 80 10/22/2010   CHOLHDL 1.8 10/22/2010    Significant Diagnostic Results in last 30 days:  No results found.  Assessment/Plan FTT (failure to thrive) in adult Supportive care, continue comfort care with Fentanyl  59mg/hr, off Namenda, ASA, Align R>B. Hospice referral. Lorazepam 0.5103mq2h prn.    A-fib 02/03/14 doing well, EKG A-fib with controlled VR.  Heart rate is in control, continue Propranolol.    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 Chronic CAD, PVD Continue Lantus 14u, Novolog 10u with meals, additional 2u if CBG>300   Dementia with behavioral disturbance Gradual decline. Increased confusion w/o new focal neurological symptoms, off Namenda, R>B, adding Lorazepam 0.22m q2h prn.    Depression Stable, continue Cymbalta 369msince 06/29/15   Edema Chronic venous insufficiency. Continue Furosemide 409mid, Spironolactone25m49mily. 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 Chronic CAD, PVD Continue Lantus 14u, Novolog 10u with meals, additional 2u if CBG>300     Family/ staff Communication: continue SNF for care needs. Hospice referral.   Labs/tests ordered: none

## 2016-03-15 NOTE — Assessment & Plan Note (Signed)
Controlled, continue Losartan 25mg  daily, Propranolol 20mg  bid, Furosemide 40mg  bid, Spironolactone 25mg , Imdur 60mg 

## 2016-03-15 NOTE — Assessment & Plan Note (Signed)
10/29/15 Hgb A1c 8.1. Chronic CAD, PVD Continue Lantus 14u, Novolog 10u with meals, additional 2u if CBG>300

## 2016-03-15 NOTE — Assessment & Plan Note (Signed)
Supportive care, continue comfort care with Fentanyl 81mcg/hr, off Namenda, ASA, Align R>B. Hospice referral. Lorazepam 0.5mg  q2h prn.

## 2016-03-15 NOTE — Assessment & Plan Note (Signed)
01/28/16 Hgb 15.6, red 4.89

## 2016-03-15 NOTE — Assessment & Plan Note (Signed)
Chronic issue, pain is controlled, continue Fentanyl 43mcg/hr

## 2016-03-15 NOTE — Assessment & Plan Note (Signed)
01/28/16 AST 28, ALT 13, alk phos 119

## 2016-03-15 NOTE — Assessment & Plan Note (Signed)
Gradual decline. Increased confusion w/o new focal neurological symptoms, off Namenda, R>B, adding Lorazepam 0.5mg  q2h prn.

## 2016-03-15 NOTE — Assessment & Plan Note (Signed)
Stable, continue colace 100 II qhs, Linzess 145mcg, MiraLax prn. 

## 2016-03-15 NOTE — Assessment & Plan Note (Signed)
Mild head titubation and hands tremor-not disabling, continue Propranolol 10mg bid 

## 2016-03-15 NOTE — Assessment & Plan Note (Signed)
Asymptomatic, continue Famotidine 20mg 

## 2016-03-15 NOTE — Assessment & Plan Note (Signed)
02/03/14 doing well, EKG A-fib with controlled VR.  Heart rate is in control, continue Propranolol.

## 2016-03-15 NOTE — Assessment & Plan Note (Signed)
Chronic venous insufficiency. Continue Furosemide 40mg  bid, Spironolactone25mg  daily. Trace edema BLE

## 2016-03-29 ENCOUNTER — Non-Acute Institutional Stay (SKILLED_NURSING_FACILITY): Payer: Medicare Other | Admitting: Nurse Practitioner

## 2016-03-29 DIAGNOSIS — M5440 Lumbago with sciatica, unspecified side: Secondary | ICD-10-CM

## 2016-03-29 DIAGNOSIS — F329 Major depressive disorder, single episode, unspecified: Secondary | ICD-10-CM | POA: Diagnosis not present

## 2016-03-29 DIAGNOSIS — R627 Adult failure to thrive: Secondary | ICD-10-CM

## 2016-03-29 DIAGNOSIS — I482 Chronic atrial fibrillation, unspecified: Secondary | ICD-10-CM

## 2016-03-29 DIAGNOSIS — F32A Depression, unspecified: Secondary | ICD-10-CM

## 2016-03-29 DIAGNOSIS — I10 Essential (primary) hypertension: Secondary | ICD-10-CM

## 2016-03-29 DIAGNOSIS — F0391 Unspecified dementia with behavioral disturbance: Secondary | ICD-10-CM

## 2016-03-29 DIAGNOSIS — F03918 Unspecified dementia, unspecified severity, with other behavioral disturbance: Secondary | ICD-10-CM

## 2016-03-29 DIAGNOSIS — S91301A Unspecified open wound, right foot, initial encounter: Secondary | ICD-10-CM | POA: Diagnosis not present

## 2016-03-29 DIAGNOSIS — R609 Edema, unspecified: Secondary | ICD-10-CM

## 2016-03-29 DIAGNOSIS — K5900 Constipation, unspecified: Secondary | ICD-10-CM | POA: Diagnosis not present

## 2016-03-29 DIAGNOSIS — K219 Gastro-esophageal reflux disease without esophagitis: Secondary | ICD-10-CM | POA: Diagnosis not present

## 2016-03-29 DIAGNOSIS — D751 Secondary polycythemia: Secondary | ICD-10-CM

## 2016-03-29 DIAGNOSIS — E1142 Type 2 diabetes mellitus with diabetic polyneuropathy: Secondary | ICD-10-CM

## 2016-03-29 DIAGNOSIS — E039 Hypothyroidism, unspecified: Secondary | ICD-10-CM

## 2016-03-29 DIAGNOSIS — G250 Essential tremor: Secondary | ICD-10-CM

## 2016-03-29 DIAGNOSIS — E1151 Type 2 diabetes mellitus with diabetic peripheral angiopathy without gangrene: Secondary | ICD-10-CM | POA: Diagnosis not present

## 2016-03-29 NOTE — Assessment & Plan Note (Signed)
Stable, dc Cymbalta 30mg , continue prn Lorazepam

## 2016-03-29 NOTE — Assessment & Plan Note (Signed)
Mild head titubation and hands tremor-not disabling, continue Propranolol 10mg  bid

## 2016-03-29 NOTE — Assessment & Plan Note (Signed)
10/29/15 Hgb A1c 8.1. Chronic CAD, PVD Continue Lantus 10u, Novolog 10u with meals, additional 2u if CBG>300

## 2016-03-29 NOTE — Assessment & Plan Note (Signed)
Gradual decline. Increased confusion w/o new focal neurological symptoms, off Namenda, R>B, adding Lorazepam 0.5mg  q2h prn.

## 2016-03-29 NOTE — Assessment & Plan Note (Signed)
02/03/14 doing well, EKG A-fib with controlled VR.  Heart rate is in control, continue Propranolol.

## 2016-03-29 NOTE — Assessment & Plan Note (Addendum)
01/28/16 Hgb 15.6, red 4.89 03/07/16 RBC 4.64, Hgb 14.9, Hct 44.5

## 2016-03-29 NOTE — Assessment & Plan Note (Signed)
Pressure ulcer, unable to stage, black scab over the area, open area around the edge with odorous drainage, entire right heel is tender, warm, swelling. Float the right heel at all times, Doxy 100mg  bid x 10 days. Observe the patient.

## 2016-03-29 NOTE — Assessment & Plan Note (Signed)
Continue Levothyroxine 112.89mcg Sat, 43mcg M, T, W, Thr, F, and Sun, last TSH 3.95 01/28/16

## 2016-03-29 NOTE — Assessment & Plan Note (Signed)
Asymptomatic, continue Famotidine 20mg 

## 2016-03-29 NOTE — Progress Notes (Signed)
Patient ID: [name], female   DOB: 1926-10-05, 82 y.o.   MRN: [id number]  Location:  [place] of Service:  SNF (31) Provider: Lennie Odor Eziah Negro NP  GREEN, Viviann Spare, MD  Patient Care Team: Estill Dooms, MD as PCP - General (Internal Medicine) Darlin Coco, MD (Cardiology) Arloa Koh, MD (Radiation Oncology) Neldon Mc, MD (General Surgery)  Extended Emergency Contact Information Primary Emergency Contact: Mcclatchey,Clifton B Address: [address]          [place], [place] [address] [place] Phone: [phone] Mobile Phone: [phone] Relation: Spouse Secondary Emergency Contact: Dorna Leitz Address: [address], [place] [address] [name] Phone: [phone] Mobile Phone: [phone] Relation: Daughter  Code Status:  DNR Goals of care: Advanced Directive information Advanced Directives 03/15/2016  Does patient have an advance directive? Yes  Type of Paramedic of [place];Out of facility DNR (pink MOST or yellow form)  Does patient want to make changes to advanced directive? No - Patient declined  Copy of advanced directive(s) in chart? Yes     Chief Complaint  Patient presents with  . Medical Management of Chronic Issues  . Acute Visit    right heel infected pressrue ulcer    HPI:  Pt is a 82 y.o. female seen today for medical management of chronic diseases.  HTN, controlled while on Losartan 25mg , not taking Imdur 60mg , Furosemide 40mg  bid, Spironolactone 25mg , Propranolol 25mg  bid, no apparent edema.  Depression, stable, takes Lorazepam prn, but not taking Cymbalta 30mg  daily, dementia, gradual declined, more bed rest, no longer beneficial of Namenda, blood sugar, takes Lantus 10u daily, 10 u with meals for CBG>100,  pressure ulcers at the R+L intergluteal cleft, stage II, slow healing. Hypothyroidism, last TSH wnl 10/2015, taking Levothyroxine 75mg   daily except Sat 172mcg. Chronic pain in the lower back, taking Fentanyl 59mcg/hr, now less pain complained since she is more bedrest. Constipation, stable while, not takignLiness 174mcg daily and Colace 200mg  hs. GERD stable on Famotidine 20mg  daily.    The patient is declining gradually, bed rest, new infected pressure ulcer right heel, non healing buttocks pressure ulcers, less oral intake, unable to take all of her meds.    Staff reported the patient's increased agitation and confusion in pm and during personal care. The patient has declined cognitively and physically, she is nearly bedrest, stated she wants to go to sleep, said goodbye to me.    Past Medical History  Diagnosis Date  . Diabetes mellitus   . Hypertension   . Heart murmur   . Benign familial tremor   . Rosacea   . Diverticulitis   . Heart failure     diastolic heart failure  . TIA (transient ischemic attack) 2010  . Arthritis   . Breast cancer ([place])     breast -rt  . CAD (coronary artery disease)     single vessel CAD per cath in 2006; scattered nonobstructive disease in the left system, left to right collaterals to the RCA which was occluded by flush shots; she has been managed medically  . Polycythemia 06/05/2015    Normal erythropoietin; O2 sat 95% room air    Past Surgical History  Procedure Laterality Date  . Mastectomy partial / lumpectomy  11/02/2000    Right, with SLN  . Mastectomy partial / lumpectomy  05/15/2008    Left  . Breast  lumpectomy  01/19/2010    right  . Mastectomy  03/31/2010    Right    Allergies  Allergen Reactions  . Penicillins Other (See Comments)    Per MAR  . Bee Venom Other (See Comments)    Per MAR  . Celebrex [Celecoxib] Other (See Comments)    Per MAR  . Lidocaine Hcl Other (See Comments)    Per MAR  . Phenobarbital Other (See Comments)    Per MAR  . Procaine Hcl Other (See Comments)    Per mar      Medication List       This list is accurate as of: 03/29/16  2:30  PM.  Always use your most recent med list.               acetaminophen 325 MG tablet  Commonly known as:  TYLENOL  Take 2 tablets (650 mg total) by mouth every 4 (four) hours as needed.     DIABETIC TUSSIN EX 100 MG/5ML liquid  Generic drug:  guaiFENesin  Take 15 mLs by mouth every 8 (eight) hours as needed for cough.     docusate sodium 100 MG capsule  Commonly known as:  COLACE  Take 200 mg by mouth at bedtime.     dorzolamide-timolol 22.3-6.8 MG/ML ophthalmic solution  Commonly known as:  COSOPT  Place 1 drop into the left eye every 12 (twelve) hours.     DULoxetine 30 MG capsule  Commonly known as:  CYMBALTA  Take 30 mg by mouth daily.     famotidine 20 MG tablet  Commonly known as:  PEPCID  Take 20 mg by mouth at bedtime.     feeding supplement (PRO-STAT SUGAR FREE 64) Liqd  Take 30 mLs by mouth daily at 8 pm.     feeding supplement Liqd  Take 1 Container by mouth 3 (three) times daily between meals.     fentaNYL 75 MCG/HR  Commonly known as:  DURAGESIC - dosed mcg/hr  Place 75 mcg onto the skin every 3 (three) days.     furosemide 40 MG tablet  Commonly known as:  LASIX  Take 40 mg by mouth 2 (two) times daily.     GLUCAGEN 1 MG Solr injection  Generic drug:  glucagon  Inject 1 mg into the vein once as needed for low blood sugar.     HUMALOG 100 UNIT/ML injection  Generic drug:  insulin lispro  Inject 10 Units into the skin 3 (three) times daily after meals. For CBG>100 give additional 2 units for CBG>300     insulin glargine 100 UNIT/ML injection  Commonly known as:  LANTUS  Inject 14 Units into the skin at bedtime.     isosorbide mononitrate 60 MG 24 hr tablet  Commonly known as:  IMDUR  Take 60 mg by mouth every morning.     LACRI-LUBE OP  Apply to eye. Apply to both eyes twice daily     levothyroxine 75 MCG tablet  Commonly known as:  SYNTHROID, LEVOTHROID  Take 75-112.5 mcg by mouth daily. Take 1 and 1/2 tablets on Saturdays then 1 tablet  all other days     LINZESS 145 MCG Caps capsule  Generic drug:  linaclotide  Take 145 mcg by mouth daily. Take 30 mins prior to breakfast on an empty stomach.     losartan 25 MG tablet  Commonly known as:  COZAAR  Take 25 mg by mouth daily.     nitroGLYCERIN 0.4 MG  SL tablet  Commonly known as:  NITROSTAT  Place 0.4 mg under the tongue every 5 (five) minutes as needed. For chest pain     polyvinyl alcohol 1.4 % ophthalmic solution  Commonly known as:  LIQUIFILM TEARS  Place 1 drop into both eyes 4 (four) times daily as needed for dry eyes.     propranolol 20 MG tablet  Commonly known as:  INDERAL  Take 20 mg by mouth 2 (two) times daily.     spironolactone 25 MG tablet  Commonly known as:  ALDACTONE  Take 25 mg by mouth daily.     UNABLE TO FIND  Med Name: Magic Cup with lunch and dinner        Review of Systems  Constitutional: Positive for activity change, appetite change and fatigue. Negative for fever, chills and diaphoresis.       C/o aches allover  HENT: Positive for hearing loss. Negative for congestion, ear discharge and ear pain.   Eyes: Negative for pain, discharge and redness.  Respiratory: Negative for cough, shortness of breath and wheezing.   Cardiovascular: Positive for leg swelling. Negative for chest pain and palpitations.  Gastrointestinal: Negative for nausea, vomiting, abdominal pain and constipation.  Endocrine: Negative for polydipsia.  Genitourinary: Positive for frequency. Negative for dysuria and urgency.       Recurrent urinary tract infections  Musculoskeletal: Positive for back pain. Negative for myalgias and neck pain.  Skin: Negative for rash.       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 R+L   Pressure ulcers at the R+L intergluteal cleft, non healing. New pressure ulcer right heel, unable to stage, black scab over the wound with the edge open and odorous drainage, fever and tenderness noted entire right heel.     Neurological: Negative for  dizziness, tremors, seizures, weakness and headaches.  Psychiatric/Behavioral: Negative for suicidal ideas and hallucinations. The patient is nervous/anxious.        More confused    Immunization History  Administered Date(s) Administered  . Influenza Whole 09/04/2013  . Influenza-Unspecified 08/21/2014, 08/21/2015  . PPD Test 04/26/2010   Pertinent  Health Maintenance Due  Topic Date Due  . OPHTHALMOLOGY EXAM  10/15/1936  . DEXA SCAN  10/15/1993  . PNA vac Low Risk Adult (1 of 2 - PCV13) 10/15/1993  . FOOT EXAM  02/05/2016  . HEMOGLOBIN A1C  04/28/2016  . INFLUENZA VACCINE  06/14/2016   Fall Risk  06/02/2015 02/05/2015 11/04/2014  Falls in the past year? No No Yes  Number falls in past yr: - - 2 or more  Risk for fall due to : - Impaired balance/gait History of fall(s);Impaired balance/gait;Impaired mobility   Functional Status Survey:    Filed Vitals:   03/29/16 1346  BP: 130/80  Pulse: 56  Temp: 98.1 F (36.7 C)  TempSrc: Tympanic  Resp: 16  SpO2: 89%   There is no weight on file to calculate BMI. Physical Exam  Constitutional: She appears well-developed.  HENT:  Head: Normocephalic and atraumatic.  Left Ear: External ear normal.  Eyes: Conjunctivae and EOM are normal. Pupils are equal, round, and reactive to light.  Neck: Neck supple. No JVD present. No thyromegaly present.  Cardiovascular: Normal rate and regular rhythm.   Murmur heard. Pulmonary/Chest: Effort normal. She has rales.  Bibasilar rales.   Abdominal: Soft. There is no tenderness. There is no rebound.  Musculoskeletal: She exhibits edema and tenderness.  Back pain is chronic which is well controlled. BLE chronic edema trace  only. Left foot external deviated deformity.   Lymphadenopathy:    She has no cervical adenopathy.  Neurological: She is alert. No cranial nerve deficit. Coordination normal.  Skin: Skin is warm and dry. Rash noted.  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 R+L   Pressure ulcers at  the R+L intergluteal cleft, non healing. New pressure ulcer right heel, unable to stage, black scab over the wound with the edge open and odorous drainage, fever and tenderness noted entire right heel.    Psychiatric: Her mood appears anxious. Her affect is not blunt, not labile and not inappropriate. Her speech is rapid and/or pressured (occasionally ). Her speech is not delayed and not slurred. She is hyperactive. She is not agitated, not aggressive, not slowed, not withdrawn, not actively hallucinating and not combative. Thought content is not paranoid and not delusional. She does not exhibit a depressed mood. She exhibits abnormal recent memory.  MMSE 21/30 08/2013    Labs reviewed:  Recent Labs  08/27/15 10/29/15 01/28/16  NA 137 140 141  K 4.1 3.8 4.8  BUN 36* 35* 42*  CREATININE 1.2* 1.0 0.9    Recent Labs  08/27/15 10/29/15 01/28/16  AST 88* 24 28  ALT 100* 15 13  ALKPHOS 120 79 119    Recent Labs  06/02/15 1455 08/27/15 10/29/15 01/28/16  WBC 7.7 9.3 6.7 7.0  NEUTROABS 5.2  --   --   --   HGB 16.5* 17.8* 15.2 15.6  HCT 48.9* 50* 45 47*  MCV 98.2  --   --   --   PLT 216 218 171 277   Lab Results  Component Value Date   TSH 3.95 01/28/2016   Lab Results  Component Value Date   HGBA1C 8.1 10/29/2015   Lab Results  Component Value Date   CHOL  10/22/2010    100        ATP III CLASSIFICATION:  <200     mg/dL   Desirable  200-239  mg/dL   Borderline High  >=240    mg/dL   High          HDL 55 10/22/2010   LDLCALC  10/22/2010    29        Total Cholesterol/HDL:CHD Risk Coronary Heart Disease Risk Table                     Men   Women  1/2 Average Risk   3.4   3.3  Average Risk       5.0   4.4  2 X Average Risk   9.6   7.1  3 X Average Risk  23.4   11.0        Use the calculated Patient Ratio above and the CHD Risk Table to determine the patient's CHD Risk.        ATP III CLASSIFICATION (LDL):  <100     mg/dL   Optimal  100-129  mg/dL   Near or  Above                    Optimal  130-159  mg/dL   Borderline  160-189  mg/dL   High  >190     mg/dL   Very High   TRIG 80 10/22/2010   CHOLHDL 1.8 10/22/2010    Significant Diagnostic Results in last 30 days:  No results found.  Assessment/Plan A-fib 02/03/14 doing well, EKG A-fib with controlled VR.  Heart rate is in control,  continue Propranolol.     Back pain Chronic issue, pain is controlled, continue Fentanyl 66mcg/hr    Constipation Stable, dc colace 100 II qhs, Linzess 172mcg, continue MiraLax prn    Controlled type 2 DM with peripheral circulatory disorder (HCC) 10/29/15 Hgb A1c 8.1. Chronic CAD, PVD Continue Lantus 10u, Novolog 10u with meals, additional 2u if CBG>300    Dementia with behavioral disturbance Gradual decline. Increased confusion w/o new focal neurological symptoms, off Namenda, R>B, adding Lorazepam 0.5mg  q2h prn.   Depression Stable, dc Cymbalta 30mg , continue prn Lorazepam    Edema Chronic venous insufficiency. dc Furosemide 40mg  bid, Spironolactone25mg  daily. No apparent edema, update CMP    Erythrocytosis 01/28/16 Hgb 15.6, red 4.89 03/07/16 RBC 4.64, Hgb 14.9, Hct 44.5     Essential tremor Mild head titubation and hands tremor-not disabling, continue Propranolol 10mg  bid    FTT (failure to thrive) in adult Supportive care, continue comfort care with Fentanyl 36mcg/hr, off Namenda, ASA, Align R>B. Lorazepam 0.5mg  q2h prn.     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 Chronic CAD, PVD Continue Lantus 10u, Novolog 10u  with meals, additional 2u if CBG>300    Open wound of right heel Pressure ulcer, unable to stage, black scab over the area, open area around the edge with odorous drainage, entire right heel is tender, warm, swelling. Float the right heel at all times, Doxy 100mg  bid x 10 days. Observe the patient.     Family/ staff Communication: continue SNF for care needs. Hospice referral.   Labs/tests ordered: CBC, CMP

## 2016-03-29 NOTE — Assessment & Plan Note (Signed)
Controlled, continue Losartan 25mg  daily, Propranolol 20mg  bid, dc Furosemide 40mg  bid, Spironolactone 25mg , Imdur 60mg 

## 2016-03-29 NOTE — Assessment & Plan Note (Signed)
Supportive care, continue comfort care with Fentanyl 29mcg/hr, off Namenda, ASA, Align R>B. Lorazepam 0.5mg  q2h prn.

## 2016-03-29 NOTE — Assessment & Plan Note (Signed)
Chronic venous insufficiency. dc Furosemide 40mg  bid, Spironolactone25mg  daily. No apparent edema, update CMP

## 2016-03-29 NOTE — Assessment & Plan Note (Signed)
Chronic issue, pain is controlled, continue Fentanyl 77mcg/hr

## 2016-03-29 NOTE — Assessment & Plan Note (Signed)
Stable, dc colace 100 II qhs, Linzess 13mcg, continue MiraLax prn

## 2016-03-31 LAB — HEPATIC FUNCTION PANEL
ALK PHOS: 111 U/L (ref 25–125)
ALT: 6 U/L — AB (ref 7–35)
AST: 14 U/L (ref 13–35)
Bilirubin, Total: 0.7 mg/dL

## 2016-03-31 LAB — CBC AND DIFFERENTIAL
HCT: 42 % (ref 36–46)
Hemoglobin: 14.3 g/dL (ref 12.0–16.0)
Platelets: 321 10*3/uL (ref 150–399)
WBC: 8.8 10^3/mL

## 2016-03-31 LAB — BASIC METABOLIC PANEL
BUN: 47 mg/dL — AB (ref 4–21)
CREATININE: 1.3 mg/dL — AB (ref <=1.1)
Glucose: 234 mg/dL
POTASSIUM: 4.9 mmol/L (ref 3.4–5.3)
SODIUM: 133 mmol/L — AB (ref 137–147)

## 2016-04-12 ENCOUNTER — Encounter: Payer: Self-pay | Admitting: Nurse Practitioner

## 2016-04-12 ENCOUNTER — Non-Acute Institutional Stay (SKILLED_NURSING_FACILITY): Payer: Medicare Other | Admitting: Nurse Practitioner

## 2016-04-12 DIAGNOSIS — G250 Essential tremor: Secondary | ICD-10-CM

## 2016-04-12 DIAGNOSIS — K219 Gastro-esophageal reflux disease without esophagitis: Secondary | ICD-10-CM | POA: Diagnosis not present

## 2016-04-12 DIAGNOSIS — F0391 Unspecified dementia with behavioral disturbance: Secondary | ICD-10-CM | POA: Diagnosis not present

## 2016-04-12 DIAGNOSIS — R609 Edema, unspecified: Secondary | ICD-10-CM | POA: Diagnosis not present

## 2016-04-12 DIAGNOSIS — I10 Essential (primary) hypertension: Secondary | ICD-10-CM

## 2016-04-12 DIAGNOSIS — F329 Major depressive disorder, single episode, unspecified: Secondary | ICD-10-CM | POA: Diagnosis not present

## 2016-04-12 DIAGNOSIS — R627 Adult failure to thrive: Secondary | ICD-10-CM

## 2016-04-12 DIAGNOSIS — I8311 Varicose veins of right lower extremity with inflammation: Secondary | ICD-10-CM

## 2016-04-12 DIAGNOSIS — F03918 Unspecified dementia, unspecified severity, with other behavioral disturbance: Secondary | ICD-10-CM

## 2016-04-12 DIAGNOSIS — I4821 Permanent atrial fibrillation: Secondary | ICD-10-CM

## 2016-04-12 DIAGNOSIS — E1142 Type 2 diabetes mellitus with diabetic polyneuropathy: Secondary | ICD-10-CM

## 2016-04-12 DIAGNOSIS — I482 Chronic atrial fibrillation: Secondary | ICD-10-CM | POA: Diagnosis not present

## 2016-04-12 DIAGNOSIS — E039 Hypothyroidism, unspecified: Secondary | ICD-10-CM | POA: Diagnosis not present

## 2016-04-12 DIAGNOSIS — M5440 Lumbago with sciatica, unspecified side: Secondary | ICD-10-CM

## 2016-04-12 DIAGNOSIS — K5900 Constipation, unspecified: Secondary | ICD-10-CM | POA: Diagnosis not present

## 2016-04-12 DIAGNOSIS — E1151 Type 2 diabetes mellitus with diabetic peripheral angiopathy without gangrene: Secondary | ICD-10-CM

## 2016-04-12 DIAGNOSIS — I8312 Varicose veins of left lower extremity with inflammation: Secondary | ICD-10-CM

## 2016-04-12 DIAGNOSIS — F32A Depression, unspecified: Secondary | ICD-10-CM

## 2016-04-12 DIAGNOSIS — I872 Venous insufficiency (chronic) (peripheral): Secondary | ICD-10-CM

## 2016-04-12 NOTE — Assessment & Plan Note (Signed)
Supportive care, continue comfort care with Fentanyl 75mcg/hr, off Namenda, ASA, Align R>B. Lorazepam 0.5mg  q2h prn.

## 2016-04-12 NOTE — Assessment & Plan Note (Signed)
10/29/15 Hgb A1c 8.1. Chronic CAD, PVD Continue Lantus 10u, Novolog 10u with meals, additional 2u if CBG>300

## 2016-04-12 NOTE — Assessment & Plan Note (Signed)
Chronic issue, pain is controlled, continue Fentanyl 69mcg/hr

## 2016-04-12 NOTE — Assessment & Plan Note (Signed)
Controlled, continue Losartan 25mg  daily, Propranolol 20mg  bid,  off Furosemide 40mg  bid, Spironolactone 25mg , imdur

## 2016-04-12 NOTE — Assessment & Plan Note (Signed)
Mild head titubation and hands tremor-not disabling, continue Propranolol 10mg  bid

## 2016-04-12 NOTE — Assessment & Plan Note (Signed)
Chronic venous insufficiency. dc Furosemide 40mg  bid, Spironolactone25mg  daily. No apparent edema

## 2016-04-12 NOTE — Assessment & Plan Note (Signed)
Continue Levothyroxine 112.83mcg Sat, 54mcg M, T, W, Thr, F, and Sun, last TSH 3.95 01/28/16

## 2016-04-12 NOTE — Assessment & Plan Note (Signed)
02/03/14 doing well, EKG A-fib with controlled VR.  Heart rate is in control, continue Propranolol.

## 2016-04-12 NOTE — Assessment & Plan Note (Signed)
Chronic but stable, currently no open wounds or s/s of infection.

## 2016-04-12 NOTE — Assessment & Plan Note (Signed)
Stable, off colace 100 II qhs, Linzess 18mcg, continue MiraLax prn

## 2016-04-12 NOTE — Progress Notes (Signed)
Patient ID: SENETRA MANFRE, female   DOB: 12-12-1925, 82 y.o.   MRN: [id number]  Location:  [facility] Room Number: N 2 Place of Service:  SNF (31) Provider: Lennie Odor Mystie Ormand NP  Estill Dooms, MD  Patient Care Team: Estill Dooms, MD as PCP - General (Internal Medicine) Darlin Coco, MD (Cardiology) Arloa Koh, MD (Radiation Oncology) Neldon Mc, MD (General Surgery)  Extended Emergency Contact Information Primary Emergency Contact: Faircloth,Clifton B Address: [address]          [place], [place] [address] [place] Phone: [phone] Mobile Phone: [phone] Relation: Spouse Secondary Emergency Contact: Dorna Leitz Address: [place], [place] [address] [name] of [place] Phone: [phone] Mobile Phone: [phone] Relation: Daughter  Code Status:  DNR Goals of care: Advanced Directive information Advanced Directives 04/12/2016  Does patient have an advance directive? Yes  Type of Paramedic of [place];Living will;Out of facility DNR (pink MOST or yellow form)  Does patient want to make changes to advanced directive? No - Patient declined  Copy of advanced directive(s) in chart? Yes     Chief Complaint  Patient presents with  . Medical Management of Chronic Issues    HPI:  Pt is a 82 y.o. female seen today for medical management of chronic diseases.  HTN, controlled while on Losartan 25mg , not taking Imdur 60mg , Furosemide 40mg  bid, Spironolactone 25mg , Propranolol 25mg  bid, no apparent edema.  Depression, stable, takes Lorazepam prn, but not taking Cymbalta 30mg  daily, dementia, gradual declined, more bed rest, no longer beneficial of Namenda, blood sugar, takes Lantus 10u daily, 10 u with meals for CBG>100,  pressure ulcers at the R+L intergluteal cleft, stage II, slow healing. Hypothyroidism, last TSH wnl 10/2015, taking Levothyroxine 75mg  daily except  Sat 163mcg. Chronic pain in the lower back, taking Fentanyl 87mcg/hr, now less pain complained since she is more bedrest. Constipation, stable while, not takignLiness 175mcg daily and Colace 200mg  hs. GERD stable on Famotidine 20mg  daily.    The patient is declining gradually, bed rest, new infected pressure ulcer right heel, non healing buttocks pressure ulcers, less oral intake, unable to take all of her meds.    Staff reported the patient's increased agitation and confusion in pm and during personal care. The patient has declined cognitively and physically, she is nearly bedrest, stated she wants to go to sleep, said goodbye to me.    Past Medical History  Diagnosis Date  . Diabetes mellitus   . Hypertension   . Heart murmur   . Benign familial tremor   . Rosacea   . Diverticulitis   . Heart failure     diastolic heart failure  . TIA (transient ischemic attack) 2010  . Arthritis   . Breast cancer ([place])     breast -rt  . CAD (coronary artery disease)     single vessel CAD per cath in 2006; scattered nonobstructive disease in the left system, left to right collaterals to the RCA which was occluded by flush shots; she has been managed medically  . Polycythemia 06/05/2015    Normal erythropoietin; O2 sat 95% room air    Past Surgical History  Procedure Laterality Date  . Mastectomy partial / lumpectomy  11/02/2000    Right, with SLN  . Mastectomy partial / lumpectomy  05/15/2008    Left  . Breast lumpectomy  01/19/2010    right  .  Mastectomy  03/31/2010    Right    Allergies  Allergen Reactions  . Penicillins Other (See Comments)    Per MAR  . Bee Venom Other (See Comments)    Per MAR  . Celebrex [Celecoxib] Other (See Comments)    Per MAR  . Lidocaine Hcl Other (See Comments)    Per MAR  . Phenobarbital Other (See Comments)    Per MAR  . Procaine Hcl Other (See Comments)    Per mar      Medication List       This list is accurate as of: 04/12/16  4:38 PM.  Always  use your most recent med list.               acetaminophen 325 MG tablet  Commonly known as:  TYLENOL  Take 2 tablets (650 mg total) by mouth every 4 (four) hours as needed.     DIABETIC TUSSIN EX 100 MG/5ML liquid  Generic drug:  guaiFENesin  Take 15 mLs by mouth every 8 (eight) hours as needed for cough.     dorzolamide-timolol 22.3-6.8 MG/ML ophthalmic solution  Commonly known as:  COSOPT  Place 1 drop into the left eye every 12 (twelve) hours.     famotidine 20 MG tablet  Commonly known as:  PEPCID  Take 20 mg by mouth at bedtime.     feeding supplement (PRO-STAT SUGAR FREE 64) Liqd  Take 30 mLs by mouth daily at 8 pm.     feeding supplement Liqd  Take 1 Container by mouth 3 (three) times daily between meals.     fentaNYL 75 MCG/HR  Commonly known as:  DURAGESIC - dosed mcg/hr  Place 50 mcg onto the skin every 3 (three) days.     GLUCAGEN 1 MG Solr injection  Generic drug:  glucagon  Inject 1 mg into the vein once as needed for low blood sugar.     HUMALOG 100 UNIT/ML injection  Generic drug:  insulin lispro  Inject 10 Units into the skin 3 (three) times daily after meals. For CBG>100 give additional 2 units for CBG>300     insulin glargine 100 UNIT/ML injection  Commonly known as:  LANTUS  Inject 14 Units into the skin at bedtime.     LACRI-LUBE OP  Apply to eye. Apply to both eyes twice daily     levothyroxine 75 MCG tablet  Commonly known as:  SYNTHROID, LEVOTHROID  Take 75-112.5 mcg by mouth daily. Take 1 and 1/2 tablets on Saturdays then 1 tablet all other days     losartan 25 MG tablet  Commonly known as:  COZAAR  Take 25 mg by mouth daily.     morphine 20 MG/5ML solution  Take 0.25 ml by mouth every 2 hours as needed for pain.     nitroGLYCERIN 0.4 MG SL tablet  Commonly known as:  NITROSTAT  Place 0.4 mg under the tongue every 5 (five) minutes as needed. For chest pain     polyvinyl alcohol 1.4 % ophthalmic solution  Commonly known as:   LIQUIFILM TEARS  Place 1 drop into both eyes 4 (four) times daily as needed for dry eyes.     propranolol 20 MG tablet  Commonly known as:  INDERAL  Take 20 mg by mouth 2 (two) times daily.     UNABLE TO FIND  Med Name: Magic Cup with lunch and dinner        Review of Systems  Constitutional: Positive for activity  change, appetite change and fatigue. Negative for fever, chills and diaphoresis.       C/o aches allover  HENT: Positive for hearing loss. Negative for congestion, ear discharge and ear pain.   Eyes: Negative for pain, discharge and redness.  Respiratory: Negative for cough, shortness of breath and wheezing.   Cardiovascular: Positive for leg swelling. Negative for chest pain and palpitations.  Gastrointestinal: Negative for nausea, vomiting, abdominal pain and constipation.  Endocrine: Negative for polydipsia.  Genitourinary: Positive for frequency. Negative for dysuria and urgency.       Recurrent urinary tract infections  Musculoskeletal: Positive for back pain. Negative for myalgias and neck pain.  Skin: Negative for rash.       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 R+L   Pressure ulcers at the R+L intergluteal cleft, non healing. New pressure ulcer right heel, unable to stage, black scab over the wound with the edge open and odorous drainage, fever and tenderness noted entire right heel.     Neurological: Negative for dizziness, tremors, seizures, weakness and headaches.  Psychiatric/Behavioral: Negative for suicidal ideas and hallucinations. The patient is nervous/anxious.        More confused    Immunization History  Administered Date(s) Administered  . Influenza Whole 09/04/2013  . Influenza-Unspecified 08/21/2014, 08/21/2015  . PPD Test 04/26/2010   Pertinent  Health Maintenance Due  Topic Date Due  . OPHTHALMOLOGY EXAM  10/15/1938  . DEXA SCAN  10/15/1993  . PNA vac Low Risk Adult (1 of 2 - PCV13) 10/15/1993  . FOOT EXAM  02/05/2016  . HEMOGLOBIN A1C   04/28/2016  . INFLUENZA VACCINE  06/14/2016   Fall Risk  06/02/2015 02/05/2015 11/04/2014  Falls in the past year? No No Yes  Number falls in past yr: - - 2 or more  Risk for fall due to : - Impaired balance/gait History of fall(s);Impaired balance/gait;Impaired mobility   Functional Status Survey:    Filed Vitals:   04/12/16 1254  BP: 140/89  Pulse: 85  Temp: 96.6 F (35.9 C)  Resp: 18  Height: 5\' 3"  (1.6 m)  Weight: 168 lb (76.204 kg)   Body mass index is 29.77 kg/(m^2). Physical Exam  Constitutional: She appears well-developed.  HENT:  Head: Normocephalic and atraumatic.  Left Ear: External ear normal.  Eyes: Conjunctivae and EOM are normal. Pupils are equal, round, and reactive to light.  Neck: Neck supple. No JVD present. No thyromegaly present.  Cardiovascular: Normal rate and regular rhythm.   Murmur heard. Pulmonary/Chest: Effort normal. She has rales.  Bibasilar rales.   Abdominal: Soft. There is no tenderness. There is no rebound.  Musculoskeletal: She exhibits edema and tenderness.  Back pain is chronic which is well controlled. BLE chronic edema trace only. Left foot external deviated deformity.   Lymphadenopathy:    She has no cervical adenopathy.  Neurological: She is alert. No cranial nerve deficit. Coordination normal.  Skin: Skin is warm and dry. Rash noted.  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 R+L   Pressure ulcers at the R+L intergluteal cleft, non healing. New pressure ulcer right heel, unable to stage, black scab over the wound with the edge open and odorous drainage, fever and tenderness noted entire right heel.    Psychiatric: Her mood appears anxious. Her affect is not blunt, not labile and not inappropriate. Her speech is rapid and/or pressured (occasionally ). Her speech is not delayed and not slurred. She is hyperactive. She is not agitated, not aggressive, not slowed, not withdrawn, not  actively hallucinating and not combative. Thought content is not  paranoid and not delusional. She does not exhibit a depressed mood. She exhibits abnormal recent memory.  MMSE 21/30 08/2013    Labs reviewed:  Recent Labs  10/29/15 01/28/16 03/31/16  NA 140 141 133*  K 3.8 4.8 4.9  BUN 35* 42* 47*  CREATININE 1.0 0.9 1.3*    Recent Labs  10/29/15 01/28/16 03/31/16  AST 24 28 14   ALT 15 13 6*  ALKPHOS 79 119 111    Recent Labs  06/02/15 1455  10/29/15 01/28/16 03/31/16  WBC 7.7  < > 6.7 7.0 8.8  NEUTROABS 5.2  --   --   --   --   HGB 16.5*  < > 15.2 15.6 14.3  HCT 48.9*  < > 45 47* 42  MCV 98.2  --   --   --   --   PLT 216  < > 171 277 321  < > = values in this interval not displayed. Lab Results  Component Value Date   TSH 3.95 01/28/2016   Lab Results  Component Value Date   HGBA1C 8.1 10/29/2015   Lab Results  Component Value Date   CHOL  10/22/2010    100        ATP III CLASSIFICATION:  <200     mg/dL   Desirable  200-239  mg/dL   Borderline High  >=240    mg/dL   High          HDL 55 10/22/2010   LDLCALC  10/22/2010    29        Total Cholesterol/HDL:CHD Risk Coronary Heart Disease Risk Table                     Men   Women  1/2 Average Risk   3.4   3.3  Average Risk       5.0   4.4  2 X Average Risk   9.6   7.1  3 X Average Risk  23.4   11.0        Use the calculated Patient Ratio above and the CHD Risk Table to determine the patient's CHD Risk.        ATP III CLASSIFICATION (LDL):  <100     mg/dL   Optimal  100-129  mg/dL   Near or Above                    Optimal  130-159  mg/dL   Borderline  160-189  mg/dL   High  >190     mg/dL   Very High   TRIG 80 10/22/2010   CHOLHDL 1.8 10/22/2010    Significant Diagnostic Results in last 30 days:  No results found.  Assessment/Plan HTN (hypertension) Controlled, continue Losartan 25mg  daily, Propranolol 20mg  bid,  off Furosemide 40mg  bid, Spironolactone 25mg , imdur     A-fib 02/03/14 doing well, EKG A-fib with controlled VR.  Heart rate is in  control, continue Propranolol.      Controlled type 2 DM with peripheral circulatory disorder ([place]) 10/29/15 Hgb A1c 8.1. Chronic CAD, PVD Continue Lantus 10u, Novolog 10u with meals, additional 2u if CBG>300     Constipation Stable, off colace 100 II qhs, Linzess 171mcg, continue MiraLax prn  GERD (gastroesophageal reflux disease) Asymptomatic, continue Famotidine 20mg   Type 2 diabetes mellitus with diabetic neuropathy affecting both sides of body ([place]) 10/29/15 Hgb A1c 8.1. Chronic CAD, PVD Continue  Lantus 10u, Novolog 10u with meals, additional 2u if CBG>300  Hypothyroidism Continue Levothyroxine 112.65mcg Sat, 25mcg M, T, W, Thr, F, and Sun, last TSH 3.95 01/28/16     Dementia with behavioral disturbance Gradual decline. Increased confusion w/o new focal neurological symptoms, off Namenda, R>B, adding Lorazepam 0.5mg  q2h prn.    Essential tremor Mild head titubation and hands tremor-not disabling, continue Propranolol 10mg  bid    Venous stasis dermatitis of both lower extremities Chronic but stable, currently no open wounds or s/s of infection.     Edema Chronic venous insufficiency. dc Furosemide 40mg  bid, Spironolactone25mg  daily. No apparent edema     Depression Stable, off Cymbalta 30mg , continue prn Lorazepam  Back pain Chronic issue, pain is controlled, continue Fentanyl 87mcg/hr    FTT (failure to thrive) in adult Supportive care, continue comfort care with Fentanyl 40mcg/hr, off Namenda, ASA, Align R>B. Lorazepam 0.5mg  q2h prn.      Family/ staff Communication: continue SNF for care needs. Hospice   Labs/tests ordered: none

## 2016-04-12 NOTE — Assessment & Plan Note (Signed)
Gradual decline. Increased confusion w/o new focal neurological symptoms, off Namenda, R>B, adding Lorazepam 0.5mg  q2h prn.

## 2016-04-12 NOTE — Assessment & Plan Note (Signed)
Asymptomatic, continue Famotidine 20mg 

## 2016-04-12 NOTE — Assessment & Plan Note (Signed)
Stable, off Cymbalta 30mg , continue prn Lorazepam

## 2016-04-25 ENCOUNTER — Non-Acute Institutional Stay (SKILLED_NURSING_FACILITY): Payer: Medicare Other | Admitting: Internal Medicine

## 2016-04-25 ENCOUNTER — Encounter: Payer: Self-pay | Admitting: Internal Medicine

## 2016-04-25 DIAGNOSIS — I10 Essential (primary) hypertension: Secondary | ICD-10-CM | POA: Diagnosis not present

## 2016-04-25 DIAGNOSIS — E1151 Type 2 diabetes mellitus with diabetic peripheral angiopathy without gangrene: Secondary | ICD-10-CM

## 2016-04-25 DIAGNOSIS — M7989 Other specified soft tissue disorders: Secondary | ICD-10-CM | POA: Diagnosis not present

## 2016-04-25 DIAGNOSIS — L89303 Pressure ulcer of unspecified buttock, stage 3: Secondary | ICD-10-CM | POA: Insufficient documentation

## 2016-04-25 DIAGNOSIS — R609 Edema, unspecified: Secondary | ICD-10-CM | POA: Diagnosis not present

## 2016-04-25 DIAGNOSIS — L89313 Pressure ulcer of right buttock, stage 3: Secondary | ICD-10-CM

## 2016-04-25 DIAGNOSIS — S91301D Unspecified open wound, right foot, subsequent encounter: Secondary | ICD-10-CM | POA: Diagnosis not present

## 2016-04-25 DIAGNOSIS — Z789 Other specified health status: Secondary | ICD-10-CM | POA: Diagnosis not present

## 2016-04-25 NOTE — Progress Notes (Signed)
Patient ID: [name], female   DOB: [birth year], 82 y.o.   MRN: [id number]  Location:  [facility] Room Number: N2 Place of Service:  SNF (31) Provider:  Estill Dooms, MD  Patient Care Team: Estill Dooms, MD as PCP - General (Internal Medicine) Darlin Coco, MD (Cardiology) Arloa Koh, MD (Radiation Oncology) Neldon Mc, MD (General Surgery)  Extended Emergency Contact Information Primary Emergency Contact: Mcclenton,Clifton B Address: [address]          [place], [place] [address] [place] Phone: [phone] Mobile Phone: [phone] Relation: Spouse Secondary Emergency Contact: Dorna Leitz Address: [place], [place] [address] [name] of [place] Phone: [phone] Mobile Phone: [phone] Relation: Daughter  Code Status:  DO NOT RESUSCITATE Goals of care: Advanced Directive information Advanced Directives 04/25/2016  Does patient have an advance directive? Yes  Type of Paramedic of [place];Living will;Out of facility DNR (pink MOST or yellow form)  Does patient want to make changes to advanced directive? -  Copy of advanced directive(s) in chart? Yes  Pre-existing out of facility DNR order (yellow form or pink MOST form) Yellow form placed in chart (order not valid for inpatient use);Pink MOST form placed in chart (order not valid for inpatient use)     Chief Complaint  Patient presents with  . Medical Management of Chronic Issues    HPI:  Pt is a 82 y.o. female seen today for medical management of chronic diseases.   Large and deep decubitus ulcer of right heel with bleeding.  Deep decubitus ulcer of the right buttock medially.  Failure to thrive with declining weight. Staff tells me oral intake is poor and she refuses medications many times. She has lost at least 12 pounds since July 2016 when she weighed 194 pounds. Diuretics were  recently discontinued due to her poor intake. Prior to this, her weight was as low as 168 pounds.  There has been an acute change in the right arm with swelling and discomfort at the shoulder and just below it.  Past Medical History  Diagnosis Date  . Diabetes mellitus   . Hypertension   . Heart murmur   . Benign familial tremor   . Rosacea   . Diverticulitis   . Heart failure     diastolic heart failure  . TIA (transient ischemic attack) 2010  . Arthritis   . Breast cancer ([facility])     breast -rt  . CAD (coronary artery disease)     single vessel CAD per cath in 2006; scattered nonobstructive disease in the left system, left to right collaterals to the RCA which was occluded by flush shots; she has been managed medically  . Polycythemia 06/05/2015    Normal erythropoietin; O2 sat 95% room air    Past Surgical History  Procedure Laterality Date  . Mastectomy partial / lumpectomy  11/02/2000    Right, with SLN  . Mastectomy partial / lumpectomy  05/15/2008    Left  . Breast lumpectomy  01/19/2010    right  . Mastectomy  03/31/2010    Right    Allergies  Allergen Reactions  . Penicillins Other (See Comments)    Per MAR  . Bee Venom Other (See Comments)    Per MAR  . Celebrex [Celecoxib] Other (See Comments)    Per MAR  . Lidocaine Hcl Other (See Comments)  Per MAR  . Phenobarbital Other (See Comments)    Per MAR  . Procaine Hcl Other (See Comments)    Per mar      Medication List       This list is accurate as of: 04/25/16  2:19 PM.  Always use your most recent med list.               acetaminophen 325 MG tablet  Commonly known as:  TYLENOL  Take 2 tablets (650 mg total) by mouth every 4 (four) hours as needed.     BAZA PROTECT EX  Apply topically. Baza protect cream apply heavy and generous amount to right and left buttocks pressure ulcer four times a day     DIABETIC TUSSIN EX 100 MG/5ML liquid  Generic drug:  guaiFENesin  Take 15 mLs by mouth every 8  (eight) hours as needed for cough.     dorzolamide-timolol 22.3-6.8 MG/ML ophthalmic solution  Commonly known as:  COSOPT  Place 1 drop into the left eye every 12 (twelve) hours.     famotidine 20 MG tablet  Commonly known as:  PEPCID  Take 20 mg by mouth at bedtime.     feeding supplement (PRO-STAT SUGAR FREE 64) Liqd  Take 30 mLs by mouth daily at 8 pm.     feeding supplement Liqd  Take 1 Container by mouth 3 (three) times daily between meals.     fentaNYL 75 MCG/HR  Commonly known as:  DURAGESIC - dosed mcg/hr  Place 50 mcg onto the skin every 3 (three) days.     GLUCAGEN 1 MG Solr injection  Generic drug:  glucagon  Inject 1 mg into the vein once as needed for low blood sugar.     HUMALOG 100 UNIT/ML injection  Generic drug:  insulin lispro  Inject 5 Units into the skin 3 (three) times daily after meals. For CBG>100 give additional 2 units for CBG>300     insulin glargine 100 UNIT/ML injection  Commonly known as:  LANTUS  Inject 10 Units into the skin at bedtime.     LACRI-LUBE OP  Apply to eye. Apply to both eyes twice daily     levothyroxine 75 MCG tablet  Commonly known as:  SYNTHROID, LEVOTHROID  Take 75-112.5 mcg by mouth daily. Take 1 and 1/2 tablets on Saturdays then 1 tablet all other days     LORazepam 0.5 MG tablet  Commonly known as:  ATIVAN  Take 0.5 mg by mouth. Take one tablet every 2 hours as needed for agitation     losartan 25 MG tablet  Commonly known as:  COZAAR  Take 25 mg by mouth daily.     morphine 20 MG/5ML solution  Take 0.25 ml by mouth every 2 hours as needed for pain.     nitroGLYCERIN 0.4 MG SL tablet  Commonly known as:  NITROSTAT  Place 0.4 mg under the tongue every 5 (five) minutes as needed. For chest pain     polyvinyl alcohol 1.4 % ophthalmic solution  Commonly known as:  LIQUIFILM TEARS  Place 1 drop into both eyes 4 (four) times daily as needed for dry eyes.     propranolol 20 MG tablet  Commonly known as:  INDERAL    Take 20 mg by mouth 2 (two) times daily.     SANTYL ointment  Generic drug:  collagenase  Apply 1 application topically. Apply Santyl and dressing to heel every day until healed  silver nitrate applicators A999333 % applicator  Apply topically. Apply to bleeding spot on the buttocks as needed any time bleeding occures as directed     silver sulfADIAZINE 1 % cream  Commonly known as:  SILVADENE  Apply 1 application topically. Apply to buttocks twice daily     UNABLE TO FIND  Med Name: Magic Cup with lunch and dinner        Review of Systems  Constitutional: Positive for activity change, appetite change, fatigue and unexpected weight change. Negative for fever, chills and diaphoresis.  HENT: Positive for hearing loss. Negative for congestion, ear discharge and ear pain.   Eyes: Negative for pain, discharge and redness.  Respiratory: Negative for cough, shortness of breath and wheezing.   Cardiovascular: Positive for leg swelling. Negative for chest pain and palpitations.  Gastrointestinal: Negative for nausea, vomiting, abdominal pain and constipation.  Endocrine: Negative for polydipsia.       Diabetic with neuropathy  Genitourinary: Positive for frequency. Negative for dysuria and urgency.       Recurrent urinary tract infections  Musculoskeletal: Positive for back pain. Negative for myalgias and neck pain.  Skin: Negative for rash.       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 R+L   Pressure ulcers at the R gluteus, non healing. Pressure ulcer right heel, stage 3 with black scab over the wound with the edge open and odorous drainage. Fever and tenderness noted entire right heel.   Neurological: Negative for dizziness, tremors, seizures, weakness and headaches.  Psychiatric/Behavioral: Negative for suicidal ideas and hallucinations. The patient is nervous/anxious.        More confused    Immunization History  Administered Date(s) Administered  . Influenza Whole 09/04/2013  .  Influenza-Unspecified 08/21/2014, 08/21/2015  . PPD Test 04/26/2010  . Pneumococcal-Unspecified 11/14/1998  . Td 11/14/2001   Pertinent  Health Maintenance Due  Topic Date Due  . OPHTHALMOLOGY EXAM  10/15/1938  . DEXA SCAN  10/15/1993  . PNA vac Low Risk Adult (1 of 2 - PCV13) 10/15/1993  . FOOT EXAM  02/05/2016  . HEMOGLOBIN A1C  04/28/2016  . INFLUENZA VACCINE  06/14/2016   Fall Risk  06/02/2015 02/05/2015 11/04/2014  Falls in the past year? No No Yes  Number falls in past yr: - - 2 or more  Risk for fall due to : - Impaired balance/gait History of fall(s);Impaired balance/gait;Impaired mobility     Filed Vitals:   04/25/16 0929  BP: 102/60  Pulse: 65  Temp: 97.4 F (36.3 C)  Resp: 18  Height: 5\' 3"  (1.6 m)  Weight: 180 lb (81.647 kg)   Body mass index is 31.89 kg/(m^2). Physical Exam  Constitutional: She appears well-developed.  HENT:  Head: Normocephalic and atraumatic.  Left Ear: External ear normal.  Eyes: Conjunctivae and EOM are normal. Pupils are equal, round, and reactive to light.  Neck: Neck supple. No JVD present. No thyromegaly present.  Cardiovascular: Normal rate and regular rhythm.   Murmur heard. Pulmonary/Chest: Effort normal. She has rales.  Bibasilar rales.   Abdominal: Soft. There is no tenderness. There is no rebound.  Musculoskeletal: She exhibits edema and tenderness.  Back pain is chronic which is well controlled. BLE chronic edema trace only. Left foot external deviated deformity.  Tender right shoulder. Restricted range of motion. Swollen at the upper humerus and in the right hand.  Lymphadenopathy:    She has no cervical adenopathy.  Neurological: She is alert. No cranial nerve deficit. Coordination normal.  Skin:  Skin is warm and dry. Rash noted.  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 R+L   Pressure ulcers at the R gluteus, non healing. Pressure ulcer right heel, stage 3 with, black scab over the wound with the edge open and odorous drainage.  Tender to palpation.  Psychiatric: Her mood appears anxious. Her affect is not blunt, not labile and not inappropriate. Her speech is rapid and/or pressured (occasionally ). Her speech is not delayed and not slurred. She is hyperactive. She is not agitated, not aggressive, not slowed, not withdrawn, not actively hallucinating and not combative. Thought content is not paranoid and not delusional. She does not exhibit a depressed mood. She exhibits abnormal recent memory.  MMSE 21/30 08/2013    Labs reviewed:  Recent Labs  10/29/15 01/28/16 03/31/16  NA 140 141 133*  K 3.8 4.8 4.9  BUN 35* 42* 47*  CREATININE 1.0 0.9 1.3*    Recent Labs  10/29/15 01/28/16 03/31/16  AST 24 28 14   ALT 15 13 6*  ALKPHOS 79 119 111    Recent Labs  06/02/15 1455  10/29/15 01/28/16 03/31/16  WBC 7.7  < > 6.7 7.0 8.8  NEUTROABS 5.2  --   --   --   --   HGB 16.5*  < > 15.2 15.6 14.3  HCT 48.9*  < > 45 47* 42  MCV 98.2  --   --   --   --   PLT 216  < > 171 277 321  < > = values in this interval not displayed. Lab Results  Component Value Date   TSH 3.95 01/28/2016   Lab Results  Component Value Date   HGBA1C 8.1 10/29/2015   Lab Results  Component Value Date   CHOL  10/22/2010    100        ATP III CLASSIFICATION:  <200     mg/dL   Desirable  200-239  mg/dL   Borderline High  >=240    mg/dL   High          HDL 55 10/22/2010   LDLCALC  10/22/2010    29        Total Cholesterol/HDL:CHD Risk Coronary Heart Disease Risk Table                     Men   Women  1/2 Average Risk   3.4   3.3  Average Risk       5.0   4.4  2 X Average Risk   9.6   7.1  3 X Average Risk  23.4   11.0        Use the calculated Patient Ratio above and the CHD Risk Table to determine the patient's CHD Risk.        ATP III CLASSIFICATION (LDL):  <100     mg/dL   Optimal  100-129  mg/dL   Near or Above                    Optimal  130-159  mg/dL   Borderline  160-189  mg/dL   High  >190     mg/dL   Very High     TRIG 80 10/22/2010   CHOLHDL 1.8 10/22/2010    Assessment/Plan 1. Open wound of right heel, subsequent encounter Poor prognosis that this wound will heal. Continue daily dressing changes.  2. Decubitus ulcer of buttock, stage 3, right ([facility])  again, there is a poor prognosis of  this wound will heal. Continue daily dressing changes  3. Leg swelling  resume furosemide 40 mg daily  4. Essential hypertension  controlled  5. Controlled type 2 DM with peripheral circulatory disorder (HCC)  controlled  6. Edema, unspecified type  resumed furosemide 40 mg daily  7. Active advance directive - DNR (Do Not Resuscitate)  8. Pain in the right shoulder - portable x-ray of the right shoulder and right humerus  9.swelling of the right hand -  resumed furosemide 40 mg daily

## 2016-04-25 NOTE — Progress Notes (Deleted)
Patient ID: [name], female   DOB: 1926/08/06, 83 y.o.   MRN: [id number]   Location:  [facility] Room Number: N2 Place of Service:  SNF (31) Provider:   Patient Care Team: Estill Dooms, MD as PCP - General (Internal Medicine) Darlin Coco, MD (Cardiology) Arloa Koh, MD (Radiation Oncology) Neldon Mc, MD (General Surgery)  Extended Emergency Contact Information Primary Emergency Contact: Bruce,Nicole B Address: [address] [place], [place] [address] [name] of [place] Phone: [phone] Mobile Phone: [phone] Relation: Spouse Secondary Emergency Contact: Dorna Leitz Address: [place], [place] [address] [name] of [place] Phone: [phone] Mobile Phone: [phone] Relation: Daughter  Code Status: *** Goals of Care: Advanced Directive information Advanced Directives 04/25/2016  Does patient have an advance directive? Yes  Type of Paramedic of [place];Living will;Out of facility DNR (pink MOST or yellow form)  Does patient want to make changes to advanced directive? -  Copy of advanced directive(s) in chart? Yes  Pre-existing out of facility DNR order (yellow form or pink MOST form) Yellow form placed in chart (order not valid for inpatient use);Pink MOST form placed in chart (order not valid for inpatient use)     Chief Complaint  Patient presents with  . Medical Management of Chronic Issues    HPI: Patient is a 82 y.o. female seen in today for an annual wellness exam.    Depression screen [facility] 2/9 06/02/2015 02/05/2015 11/04/2014  Decreased Interest 0 0 0  Down, Depressed, Hopeless 0 0 0  PHQ - 2 Score 0 0 0    Fall Risk  06/02/2015 02/05/2015 11/04/2014  Falls in the past year? No No Yes  Number falls in past yr: - - 2 or more  Risk for fall due to : - Impaired balance/gait History of fall(s);Impaired balance/gait;Impaired mobility    No flowsheet data found.   Health Maintenance  Topic Date Due  . OPHTHALMOLOGY EXAM  10/15/1938  . TETANUS/TDAP  10/16/1947  . ZOSTAVAX  10/15/1988  . DEXA SCAN  10/15/1993  . PNA vac Low Risk Adult (1 of 2 - PCV13) 10/15/1993  . FOOT EXAM  02/05/2016  . HEMOGLOBIN A1C  04/28/2016  . INFLUENZA VACCINE  06/14/2016    Urinary incontinence? Functional Status Survey:   Exercise? Diet? No exam data present Hearing:   Dentition: Pain:  Past Medical History  Diagnosis Date  . Diabetes mellitus   . Hypertension   . Heart murmur   . Benign familial tremor   . Rosacea   . Diverticulitis   . Heart failure     diastolic heart failure  . TIA (transient ischemic attack) 2010  . Arthritis   . Breast cancer ([facility])     breast -rt  . CAD (coronary artery disease)     single vessel CAD per cath in 2006; scattered nonobstructive disease in the left system, left to right collaterals to the RCA which was occluded by flush shots; she has been managed medically  . Polycythemia 06/05/2015    Normal erythropoietin; O2 sat 95% room air     Past Surgical History  Procedure Laterality Date  . Mastectomy partial / lumpectomy  11/02/2000    Right, with SLN  . Mastectomy partial / lumpectomy  05/15/2008    Left  . Breast lumpectomy  01/19/2010    right  .  Mastectomy  03/31/2010    Right    The patient has a family history of  shoulder Social History   Social History  . Marital Status: Married    Spouse Name: N/A  . Number of Children: N/A  . Years of Education: N/A   Occupational History  . Not on file.   Social History Main Topics  . Smoking status: Never Smoker   . Smokeless tobacco: Never Used  . Alcohol Use: No  . Drug Use: No  . Sexual Activity: No   Other Topics Concern  . Not on file   Social History Narrative    Allergies  Allergen Reactions  . Penicillins Other (See Comments)    Per MAR  . Bee Venom Other (See Comments)    Per MAR  . Celebrex  [Celecoxib] Other (See Comments)    Per MAR  . Lidocaine Hcl Other (See Comments)    Per MAR  . Phenobarbital Other (See Comments)    Per MAR  . Procaine Hcl Other (See Comments)    Per mar      Medication List       This list is accurate as of: 04/25/16  9:25 AM.  Always use your most recent med list.               acetaminophen 325 MG tablet  Commonly known as:  TYLENOL  Take 2 tablets (650 mg total) by mouth every 4 (four) hours as needed.     BAZA PROTECT EX  Apply topically. Baza protect cream apply heavy and generous amount to right and left buttocks pressure ulcer four times a day     DIABETIC TUSSIN EX 100 MG/5ML liquid  Generic drug:  guaiFENesin  Take 15 mLs by mouth every 8 (eight) hours as needed for cough.     dorzolamide-timolol 22.3-6.8 MG/ML ophthalmic solution  Commonly known as:  COSOPT  Place 1 drop into the left eye every 12 (twelve) hours.     famotidine 20 MG tablet  Commonly known as:  PEPCID  Take 20 mg by mouth at bedtime.     feeding supplement (PRO-STAT SUGAR FREE 64) Liqd  Take 30 mLs by mouth daily at 8 pm.     feeding supplement Liqd  Take 1 Container by mouth 3 (three) times daily between meals.     fentaNYL 75 MCG/HR  Commonly known as:  DURAGESIC - dosed mcg/hr  Place 50 mcg onto the skin every 3 (three) days.     GLUCAGEN 1 MG Solr injection  Generic drug:  glucagon  Inject 1 mg into the vein once as needed for low blood sugar.     HUMALOG 100 UNIT/ML injection  Generic drug:  insulin lispro  Inject 5 Units into the skin 3 (three) times daily after meals. For CBG>100 give additional 2 units for CBG>300     insulin glargine 100 UNIT/ML injection  Commonly known as:  LANTUS  Inject 10 Units into the skin at bedtime.     LACRI-LUBE OP  Apply to eye. Apply to both eyes twice daily     levothyroxine 75 MCG tablet  Commonly known as:  SYNTHROID, LEVOTHROID  Take 75-112.5 mcg by mouth daily. Take 1 and 1/2 tablets on  Saturdays then 1 tablet all other days     LORazepam 0.5 MG tablet  Commonly known as:  ATIVAN  Take 0.5 mg by mouth. Take one tablet every 2 hours as needed for agitation  losartan 25 MG tablet  Commonly known as:  COZAAR  Take 25 mg by mouth daily.     morphine 20 MG/5ML solution  Take 0.25 ml by mouth every 2 hours as needed for pain.     nitroGLYCERIN 0.4 MG SL tablet  Commonly known as:  NITROSTAT  Place 0.4 mg under the tongue every 5 (five) minutes as needed. For chest pain     polyvinyl alcohol 1.4 % ophthalmic solution  Commonly known as:  LIQUIFILM TEARS  Place 1 drop into both eyes 4 (four) times daily as needed for dry eyes.     propranolol 20 MG tablet  Commonly known as:  INDERAL  Take 20 mg by mouth 2 (two) times daily.     SANTYL ointment  Generic drug:  collagenase  Apply 1 application topically. Apply Santyl and dressing to heel every day until healed     silver nitrate applicators A999333 % applicator  Apply topically. Apply to bleeding spot on the buttocks as needed any time bleeding occures as directed     silver sulfADIAZINE 1 % cream  Commonly known as:  SILVADENE  Apply 1 application topically. Apply to buttocks twice daily     UNABLE TO FIND  Med Name: Magic Cup with lunch and dinner         Review of Systems:  Review of Systems  Physical Exam: There were no vitals filed for this visit. There is no weight on file to calculate BMI. Physical Exam  Labs reviewed: Basic Metabolic Panel:  Recent Labs  04/27/15 08/27/15 10/29/15 01/28/16 03/31/16  NA 137 137 140 141 133*  K 3.7 4.1 3.8 4.8 4.9  BUN 28* 36* 35* 42* 47*  CREATININE 0.9 1.2* 1.0 0.9 1.3*  TSH 2.66 2.37  --  3.95  --    Liver Function Tests:  Recent Labs  10/29/15 01/28/16 03/31/16  AST 24 28 14   ALT 15 13 6*  ALKPHOS 79 119 111   No results for input(s): LIPASE, AMYLASE in the last 8760 hours. No results for input(s): AMMONIA in the last 8760  hours. CBC:  Recent Labs  06/02/15 1455  10/29/15 01/28/16 03/31/16  WBC 7.7  < > 6.7 7.0 8.8  NEUTROABS 5.2  --   --   --   --   HGB 16.5*  < > 15.2 15.6 14.3  HCT 48.9*  < > 45 47* 42  MCV 98.2  --   --   --   --   PLT 216  < > 171 277 321  < > = values in this interval not displayed. Lipid Panel: No results for input(s): CHOL, HDL, LDLCALC, TRIG, CHOLHDL, LDLDIRECT in the last 8760 hours. Lab Results  Component Value Date   HGBA1C 8.1 10/29/2015    Procedures: No results found.  Assessment/Plan 1. Active advance directive *** - DNR (Do Not Resuscitate)    Labs/tests ordered:  @ORDERS @ Next appt:  @NEXTENCTHIS  DEPT@

## 2016-05-02 LAB — BASIC METABOLIC PANEL
BUN: 37 mg/dL — AB (ref 4–21)
CREATININE: 0.8 mg/dL (ref <=1.1)
GLUCOSE: 215 mg/dL
Potassium: 4.9 mmol/L (ref 3.4–5.3)
Sodium: 142 mmol/L (ref 137–147)

## 2016-05-02 LAB — CBC AND DIFFERENTIAL
HCT: 41 % (ref 36–46)
Hemoglobin: 13.5 g/dL (ref 12.0–16.0)
Platelets: 312 10*3/uL (ref 150–399)
WBC: 7.9 10^3/mL

## 2016-05-02 LAB — HEPATIC FUNCTION PANEL
ALT: 7 U/L (ref 7–35)
AST: 16 U/L (ref 13–35)
Alkaline Phosphatase: 114 U/L (ref 25–125)
BILIRUBIN, TOTAL: 0.7 mg/dL

## 2016-05-03 ENCOUNTER — Other Ambulatory Visit: Payer: Self-pay | Admitting: *Deleted

## 2016-05-27 ENCOUNTER — Other Ambulatory Visit: Payer: Self-pay | Admitting: *Deleted

## 2016-05-27 MED ORDER — FENTANYL 50 MCG/HR TD PT72: 50.0000 ug | MEDICATED_PATCH | TRANSDERMAL | Status: AC

## 2016-05-27 NOTE — Telephone Encounter (Signed)
Amber with [facility] [phone] called and stated that they service [facility] and needs a Rx for patient's Fentanyl Patch. Patient is out. Needs it faxed to Fax#: [phone]

## 2016-05-31 LAB — BASIC METABOLIC PANEL
BUN: 37 mg/dL — AB (ref 4–21)
CREATININE: 0.8 mg/dL (ref <=1.1)
Glucose: 132 mg/dL
Potassium: 3.7 mmol/L (ref 3.4–5.3)
Sodium: 137 mmol/L (ref 137–147)

## 2016-06-07 ENCOUNTER — Non-Acute Institutional Stay (SKILLED_NURSING_FACILITY): Payer: Medicare Other | Admitting: Nurse Practitioner

## 2016-06-07 DIAGNOSIS — I10 Essential (primary) hypertension: Secondary | ICD-10-CM

## 2016-06-07 DIAGNOSIS — I480 Paroxysmal atrial fibrillation: Secondary | ICD-10-CM

## 2016-06-07 DIAGNOSIS — E1151 Type 2 diabetes mellitus with diabetic peripheral angiopathy without gangrene: Secondary | ICD-10-CM

## 2016-06-07 DIAGNOSIS — S91301S Unspecified open wound, right foot, sequela: Secondary | ICD-10-CM | POA: Diagnosis not present

## 2016-06-07 DIAGNOSIS — N390 Urinary tract infection, site not specified: Secondary | ICD-10-CM | POA: Diagnosis not present

## 2016-06-07 NOTE — Assessment & Plan Note (Signed)
04/22/14 Urine culture E Coli 7 day course of Cipro 500mg  bid.  10/01/14 Urine culture pos nitrate, large leukocyte esterase, many bacteria, wbc 21-50. Empirical ABT Nitrofurantoinin 100mg  bid x 7 days started 11/181/5.  05/17/15 urine culture E Coli, 05/18/15 7 day course Cipro 250mg  bid 07/30/15 urine culture E. Coli Nitrofurantoin 100mg  bid x 7 days 09/20/15 urine culture, K. [place] 7 day course Cipro 500mg  bid  10/30/15 urine culture E. Coli Nitrofurantoin 100mg  bid x 10days.  06/07/16 urine culture P. Mirabilis Septra DS bid x 10 days.

## 2016-06-07 NOTE — Assessment & Plan Note (Signed)
02/03/14 doing well, EKG A-fib with controlled VR.  Heart rate is in control, continue Propranolol.

## 2016-06-07 NOTE — Progress Notes (Signed)
Patient ID: [name], female   DOB: [birth year], 82 y.o.   MRN: [id number]  Location:    [facility] Room Number: N-2 Place of Service:  SNF (31) Provider: Lennie Odor Kemo Spruce NP  Jeanmarie Hubert, MD  Patient Care Team: Estill Dooms, MD as PCP - General (Internal Medicine) Darlin Coco, MD (Cardiology) Arloa Koh, MD (Radiation Oncology) Neldon Mc, MD (General Surgery)  Extended Emergency Contact Information Primary Emergency Contact: Steedley,Clifton B Address: [address]          [place], [place] [address] [place] Phone: [phone] Mobile Phone: [phone] Relation: Spouse Secondary Emergency Contact: Dorna Leitz Address: [address], [place] [address] [name] Phone: [phone] Mobile Phone: [phone] Relation: Daughter  Code Status:  DNR Goals of care: Advanced Directive information Advanced Directives 06/07/2016  Does patient have an advance directive? Yes  Type of Par[employer] of [place];Living will;Out of facility DNR (pink MOST or yellow form)  Does patient want to make changes to advanced directive? No - Patient declined  Copy of advanced directive(s) in chart? Yes  Pre-existing out of facility DNR order (yellow form or pink MOST form) -     Chief Complaint  Patient presents with  . Acute Visit    Reasses right heel wound getting worse(odor noted), right lower arm blister    HPI:  Pt is a 82 y.o. female seen today for medical management of chronic diseases.  HTN, controlled while on Losartan 25mg , not taking Imdur 60mg , Furosemide 40mg  bid, Spironolactone 25mg , Propranolol 25mg  bid, no apparent edema.  Depression, stable, takes Lorazepam prn, but not taking Cymbalta 30mg  daily, dementia, gradual declined, more bed rest, no longer beneficial of Namenda, blood sugar, takes Lantus 10u daily, 10 u with meals for CBG>100,  pressure ulcers at the R+L intergluteal  cleft, stage II, slow healing. Pressure ulcer right heel, odorous, non healing. Positive urine culture for UTI, susceptible to Septra DS.  Hypothyroidism, last TSH wnl 10/2015, taking Levothyroxine 75mg  daily except Sat 166mcg. Chronic pain in the lower back, taking Fentanyl 84mcg/hr, now less pain complained since she is more bedrest. Constipation, stable while, not takignLiness 128mcg daily and Colace 200mg  hs. GERD stable on Famotidine 20mg  daily.    The patient is declining gradually, bed rest, new infected pressure ulcer right heel, non healing buttocks pressure ulcers, less oral intake, unable to take all of her meds.    Staff reported the patient's increased agitation and confusion in pm and during personal care. The patient has declined cognitively and physically, she is nearly bedrest, stated she wants to go to sleep, said goodbye to me.    Past Medical History:  Diagnosis Date  . Arthritis   . Benign familial tremor   . Breast cancer ([place])    breast -rt  . CAD (coronary artery disease)    single vessel CAD per cath in 2006; scattered nonobstructive disease in the left system, left to right collaterals to the RCA which was occluded by flush shots; she has been managed medically  . Diabetes mellitus   . Diverticulitis   . Heart failure    diastolic heart failure  . Heart murmur   . Hypertension   . Polycythemia 06/05/2015   Normal erythropoietin; O2 sat 95% room air   . Rosacea   . TIA (transient ischemic attack) 2010   Past Surgical History:  Procedure Laterality Date  .  BREAST LUMPECTOMY  01/19/2010   right  . MASTECTOMY  03/31/2010   Right  . MASTECTOMY PARTIAL / LUMPECTOMY  11/02/2000   Right, with SLN  . MASTECTOMY PARTIAL / LUMPECTOMY  05/15/2008   Left    Allergies  Allergen Reactions  . Penicillins Other (See Comments)    Per MAR  . Bee Venom Other (See Comments)    Per MAR  . Celebrex [Celecoxib] Other (See Comments)    Per MAR  . Lidocaine Hcl Other (See  Comments)    Per MAR  . Phenobarbital Other (See Comments)    Per MAR  . Procaine Hcl Other (See Comments)    Per mar      Medication List       Accurate as of 06/07/16  7:08 PM. Always use your most recent med list.          acetaminophen 325 MG tablet Commonly known as:  TYLENOL Take 2 tablets (650 mg total) by mouth every 4 (four) hours as needed.   BAZA PROTECT EX Apply topically. Baza protect cream apply heavy and generous amount to right and left buttocks pressure ulcer four times a day   DIABETIC TUSSIN EX 100 MG/5ML liquid Generic drug:  guaiFENesin Take 15 mLs by mouth every 8 (eight) hours as needed for cough.   dorzolamide-timolol 22.3-6.8 MG/ML ophthalmic solution Commonly known as:  COSOPT Place 1 drop into the left eye every 12 (twelve) hours.   famotidine 20 MG tablet Commonly known as:  PEPCID Take 20 mg by mouth at bedtime.   feeding supplement (PRO-STAT SUGAR FREE 64) Liqd Take 30 mLs by mouth daily at 8 pm.   feeding supplement Liqd Take 1 Container by mouth 3 (three) times daily between meals.   fentaNYL 50 MCG/HR Commonly known as:  DURAGESIC - dosed mcg/hr Place 1 patch (50 mcg total) onto the skin every 3 (three) days.   GLUCAGEN 1 MG Solr injection Generic drug:  glucagon Inject 1 mg into the vein once as needed for low blood sugar.   HUMALOG 100 UNIT/ML injection Generic drug:  insulin lispro Inject 5 Units into the skin 3 (three) times daily after meals. For CBG>100 give additional 2 units for CBG>300   insulin glargine 100 UNIT/ML injection Commonly known as:  LANTUS Inject 10 Units into the skin at bedtime.   LACRI-LUBE OP Apply to eye. Apply to both eyes twice daily   levothyroxine 75 MCG tablet Commonly known as:  SYNTHROID, LEVOTHROID Take 75-112.5 mcg by mouth daily. Take 1 and 1/2 tablets on Saturdays then 1 tablet all other days   LORazepam 0.5 MG tablet Commonly known as:  ATIVAN Take 0.5 mg by mouth. Take one tablet  every 2 hours as needed for agitation   losartan 25 MG tablet Commonly known as:  COZAAR Take 25 mg by mouth daily.   morphine 20 MG/5ML solution Take 0.25 ml by mouth every 2 hours as needed for pain.   nitroGLYCERIN 0.4 MG SL tablet Commonly known as:  NITROSTAT Place 0.4 mg under the tongue every 5 (five) minutes as needed. For chest pain   polyvinyl alcohol 1.4 % ophthalmic solution Commonly known as:  LIQUIFILM TEARS Place 1 drop into both eyes 4 (four) times daily as needed for dry eyes.   propranolol 20 MG tablet Commonly known as:  INDERAL Take 20 mg by mouth 2 (two) times daily.   SANTYL ointment Generic drug:  collagenase Apply 1 application topically. Apply Santyl and  dressing to heel every day until healed   silver nitrate applicators [id number] % applicator Apply topically. Apply to bleeding spot on the buttocks as needed any time bleeding occures as directed   silver sulfADIAZINE 1 % cream Commonly known as:  SILVADENE Apply 1 application topically. Apply to buttocks twice daily   UNABLE TO FIND Med Name: Magic Cup with lunch and dinner       Review of Systems  Constitutional: Positive for activity change, appetite change and fatigue. Negative for chills, diaphoresis and fever.       C/o aches allover  HENT: Positive for hearing loss. Negative for congestion, ear discharge and ear pain.   Eyes: Negative for pain, discharge and redness.  Respiratory: Negative for cough, shortness of breath and wheezing.   Cardiovascular: Positive for leg swelling. Negative for chest pain and palpitations.  Gastrointestinal: Negative for abdominal pain, constipation, nausea and vomiting.  Endocrine: Negative for polydipsia.  Genitourinary: Positive for frequency. Negative for dysuria and urgency.       Recurrent urinary tract infections  Musculoskeletal: Positive for back pain. Negative for myalgias and neck pain.  Skin: Negative for rash.       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 R+L   Pressure ulcers at the R+L intergluteal cleft, non healing. New pressure ulcer right heel, unable to stage, black scab over the wound with the edge open and odorous drainage, fever and tenderness noted entire right heel.     Neurological: Negative for dizziness, tremors, seizures, weakness and headaches.  Psychiatric/Behavioral: Negative for hallucinations and suicidal ideas. The patient is nervous/anxious.        More confused    Immunization History  Administered Date(s) Administered  . Influenza Whole 09/04/2013  . Influenza-Unspecified 08/21/2014, 08/21/2015  . PPD Test 04/26/2010  . Pneumococcal-Unspecified 11/14/1998  . Td 11/14/2001   Pertinent  Health Maintenance Due  Topic Date Due  . OPHTHALMOLOGY EXAM  10/15/1938  . DEXA SCAN  10/15/1993  . PNA vac Low Risk Adult (2 of 2 - PCV13) 11/15/1999  . FOOT EXAM  02/05/2016  . HEMOGLOBIN A1C  04/28/2016  . INFLUENZA VACCINE  06/14/2016   Fall Risk  06/02/2015 02/05/2015 11/04/2014  Falls in the past year? No No Yes  Number falls in past yr: - - 2 or more  Risk for fall due to : - Impaired balance/gait History of fall(s);Impaired balance/gait;Impaired mobility   Functional Status Survey:    There were no vitals filed for this visit. There is no height or weight on file to calculate BMI. Physical Exam  Constitutional: She appears well-developed.  HENT:  Head: Normocephalic and atraumatic.  Left Ear: External ear normal.  Eyes: Conjunctivae and EOM are normal. Pupils are equal, round, and reactive to light.  Neck: Neck supple. No JVD present. No thyromegaly present.  Cardiovascular: Normal rate and regular rhythm.   Murmur heard. Pulmonary/Chest: Effort normal. She has rales.  Bibasilar rales.   Abdominal: Soft. There is no tenderness. There is no rebound.  Musculoskeletal: She exhibits edema and tenderness.  Back pain is chronic which is well controlled. BLE chronic edema trace only. Left foot external  deviated deformity.   Lymphadenopathy:    She has no cervical adenopathy.  Neurological: She is alert. No cranial nerve deficit. Coordination normal.  Skin: Skin is warm and dry. Rash noted.  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 R+L   Pressure ulcers at the R+L intergluteal cleft, non healing. New pressure ulcer right heel, unable to stage,  black scab over the wound with the edge open and odorous drainage, fever and tenderness noted entire right heel.    Psychiatric: Her mood appears anxious. Her affect is not blunt, not labile and not inappropriate. Her speech is rapid and/or pressured (occasionally ). Her speech is not delayed and not slurred. She is hyperactive. She is not agitated, not aggressive, not slowed, not withdrawn, not actively hallucinating and not combative. Thought content is not paranoid and not delusional. She does not exhibit a depressed mood. She exhibits abnormal recent memory.  MMSE 21/30 08/2013    Labs reviewed:  Recent Labs  01/28/16 03/31/16 05/02/16  NA 141 133* 142  K 4.8 4.9 4.9  BUN 42* 47* 37*  CREATININE 0.9 1.3* 0.8    Recent Labs  01/28/16 03/31/16 05/02/16  AST 28 14 16   ALT 13 6* 7  ALKPHOS 119 111 114    Recent Labs  01/28/16 03/31/16 05/02/16  WBC 7.0 8.8 7.9  HGB 15.6 14.3 13.5  HCT 47* 42 41  PLT 277 321 312   Lab Results  Component Value Date   TSH 3.95 01/28/2016   Lab Results  Component Value Date   HGBA1C 8.1 10/29/2015   Lab Results  Component Value Date   CHOL  10/22/2010    100        ATP III CLASSIFICATION:  <200     mg/dL   Desirable  200-239  mg/dL   Borderline High  >=240    mg/dL   High          HDL 55 10/22/2010   LDLCALC  10/22/2010    29        Total Cholesterol/HDL:CHD Risk Coronary Heart Disease Risk Table                     Men   Women  1/2 Average Risk   3.4   3.3  Average Risk       5.0   4.4  2 X Average Risk   9.6   7.1  3 X Average Risk  23.4   11.0        Use the calculated Patient  Ratio above and the CHD Risk Table to determine the patient's CHD Risk.        ATP III CLASSIFICATION (LDL):  <100     mg/dL   Optimal  100-129  mg/dL   Near or Above                    Optimal  130-159  mg/dL   Borderline  160-189  mg/dL   High  >190     mg/dL   Very High   TRIG 80 10/22/2010   CHOLHDL 1.8 10/22/2010    Significant Diagnostic Results in last 30 days:  No results found.  Assessment/Plan UTI (urinary tract infection) 04/22/14 Urine culture E Coli 7 day course of Cipro 500mg  bid.  10/01/14 Urine culture pos nitrate, large leukocyte esterase, many bacteria, wbc 21-50. Empirical ABT Nitrofurantoinin 100mg  bid x 7 days started 11/181/5.  05/17/15 urine culture E Coli, 05/18/15 7 day course Cipro 250mg  bid 07/30/15 urine culture E. Coli Nitrofurantoin 100mg  bid x 7 days 09/20/15 urine culture, K. [place] 7 day course Cipro 500mg  bid  10/30/15 urine culture E. Coli Nitrofurantoin 100mg  bid x 10days.  06/07/16 urine culture P. Mirabilis Septra DS bid x 10 days.    Open wound of right heel Pressure ulcer, 8cmx5cm, odorous  drainage noted, pressure reduction, Septra DS bid x10 days for UTI may beneficial the wound healing.   HTN (hypertension) Controlled, continue Losartan 25mg  daily, Propranolol 20mg  bid,  off Furosemide 40mg  bid, Spironolactone 25mg , imdur    A-fib 02/03/14 doing well, EKG A-fib with controlled VR.  Heart rate is in control, continue Propranolol.  Controlled type 2 DM with peripheral circulatory disorder ([place]) 10/29/15 Hgb A1c 8.1. Chronic CAD, PVD Continue Lantus 10u, Novolog 10u with meals, additional 2u if CBG>300    Family/ staff Communication: continue SNF for care needs. Hospice   Labs/tests ordered: none

## 2016-06-07 NOTE — Assessment & Plan Note (Signed)
10/29/15 Hgb A1c 8.1. Chronic CAD, PVD Continue Lantus 10u, Novolog 10u with meals, additional 2u if CBG>300

## 2016-06-07 NOTE — Assessment & Plan Note (Signed)
Pressure ulcer, 8cmx5cm, odorous drainage noted, pressure reduction, Septra DS bid x10 days for UTI may beneficial the wound healing.

## 2016-06-07 NOTE — Assessment & Plan Note (Signed)
Controlled, continue Losartan 25mg  daily, Propranolol 20mg  bid,  off Furosemide 40mg  bid, Spironolactone 25mg , imdur

## 2016-06-14 ENCOUNTER — Encounter: Payer: Self-pay | Admitting: Nurse Practitioner

## 2016-06-14 ENCOUNTER — Non-Acute Institutional Stay (SKILLED_NURSING_FACILITY): Payer: Medicare Other | Admitting: Nurse Practitioner

## 2016-06-14 DIAGNOSIS — E1151 Type 2 diabetes mellitus with diabetic peripheral angiopathy without gangrene: Secondary | ICD-10-CM

## 2016-06-14 DIAGNOSIS — E039 Hypothyroidism, unspecified: Secondary | ICD-10-CM | POA: Diagnosis not present

## 2016-06-14 DIAGNOSIS — M5440 Lumbago with sciatica, unspecified side: Secondary | ICD-10-CM

## 2016-06-14 DIAGNOSIS — E1142 Type 2 diabetes mellitus with diabetic polyneuropathy: Secondary | ICD-10-CM

## 2016-06-14 DIAGNOSIS — I872 Venous insufficiency (chronic) (peripheral): Secondary | ICD-10-CM

## 2016-06-14 DIAGNOSIS — R627 Adult failure to thrive: Secondary | ICD-10-CM | POA: Diagnosis not present

## 2016-06-14 DIAGNOSIS — I8312 Varicose veins of left lower extremity with inflammation: Secondary | ICD-10-CM

## 2016-06-14 DIAGNOSIS — I10 Essential (primary) hypertension: Secondary | ICD-10-CM

## 2016-06-14 DIAGNOSIS — I480 Paroxysmal atrial fibrillation: Secondary | ICD-10-CM | POA: Diagnosis not present

## 2016-06-14 DIAGNOSIS — K219 Gastro-esophageal reflux disease without esophagitis: Secondary | ICD-10-CM

## 2016-06-14 DIAGNOSIS — I8311 Varicose veins of right lower extremity with inflammation: Secondary | ICD-10-CM

## 2016-06-14 NOTE — Progress Notes (Signed)
Location:   [facility] Room Number: N 2 Place of Service:  SNF (31) Provider:  Tashan Kreitzer  NP    Patient Care Team: Estill Dooms, MD as PCP - General (Internal Medicine) Darlin Coco, MD (Cardiology) Arloa Koh, MD (Radiation Oncology) Neldon Mc, MD (General Surgery) Eulan Heyward Otho Darner, NP as Nurse Practitioner (Internal Medicine)  Extended Emergency Contact Information Primary Emergency Contact: Golz,Clifton B Address: [address]          [place], [place] [address] [place] Phone: [phone] Mobile Phone: [phone] Relation: Spouse Secondary Emergency Contact: Dorna Leitz Address: [place], [place] [address] [name] of [place] Phone: [phone] Mobile Phone: [phone] Relation: Daughter  Code Status:  DNR Goals of care: Advanced Directive information Advanced Directives 06/14/2016  Does patient have an advance directive? Yes  Type of Paramedic of [place];Living will;Out of facility DNR (pink MOST or yellow form)  Does patient want to make changes to advanced directive? No - Patient declined  Copy of advanced directive(s) in chart? Yes  Pre-existing out of facility DNR order (yellow form or pink MOST form) -     Chief Complaint  Patient presents with  . Acute Visit    skin tear to left foot (7/30)    HPI:  Pt is a 82 y.o. female seen today for an acute visit for blood sugar, in 60s 12pm.      HTN, controlled while on Losartan 25mg , Propranolol 25mg  bid, no apparent edema.  Depression, stable, takes Lorazepam prn,  dementia, gradual declined, more bed rest, total care, blood sugar, takes Lantus 10u daily, 10 u with meals for CBG>100,  pressure ulcers at the R+L intergluteal cleft, stage II, slow healing. Pressure ulcer right heel, odorous, non healing. Positive urine culture for UTI, susceptible to Septra DS.  Hypothyroidism, last TSH wnl  10/2015, taking Levothyroxine 75mg  daily except Sat 179mcg. Chronic pain in the lower back, taking Fentanyl 44mcg/hr, now less pain complained since she is more bedrest. Constipation, stable. GERD stable on Famotidine 20mg  daily.    Past Medical History:  Diagnosis Date  . Arthritis   . Benign familial tremor   . Breast cancer ([facility])    breast -rt  . CAD (coronary artery disease)    single vessel CAD per cath in 2006; scattered nonobstructive disease in the left system, left to right collaterals to the RCA which was occluded by flush shots; she has been managed medically  . Diabetes mellitus   . Diverticulitis   . Heart failure    diastolic heart failure  . Heart murmur   . Hypertension   . Polycythemia 06/05/2015   Normal erythropoietin; O2 sat 95% room air   . Rosacea   . TIA (transient ischemic attack) 2010   Past Surgical History:  Procedure Laterality Date  . BREAST LUMPECTOMY  01/19/2010   right  . MASTECTOMY  03/31/2010   Right  . MASTECTOMY PARTIAL / LUMPECTOMY  11/02/2000   Right, with SLN  . MASTECTOMY PARTIAL / LUMPECTOMY  05/15/2008   Left    Allergies  Allergen Reactions  . Penicillins Other (See Comments)    Per MAR  . Bee Venom Other (See Comments)    Per MAR  . Celebrex [Celecoxib] Other (See Comments)    Per MAR  . Lidocaine Hcl Other (See Comments)    Per MAR  .  Phenobarbital Other (See Comments)    Per MAR  . Procaine Hcl Other (See Comments)    Per mar      Medication List       Accurate as of 06/14/16 11:59 PM. Always use your most recent med list.          acetaminophen 325 MG tablet Commonly known as:  TYLENOL Take 2 tablets (650 mg total) by mouth every 4 (four) hours as needed.   BAZA PROTECT EX Apply topically. Baza protect cream apply heavy and generous amount to right and left buttocks pressure ulcer four times a day   DIABETIC TUSSIN EX 100 MG/5ML liquid Generic drug:  guaiFENesin Take 15 mLs by mouth every 8 (eight) hours as  needed for cough.   dorzolamide-timolol 22.3-6.8 MG/ML ophthalmic solution Commonly known as:  COSOPT Place 1 drop into the left eye every 12 (twelve) hours.   famotidine 20 MG tablet Commonly known as:  PEPCID Take 20 mg by mouth at bedtime.   feeding supplement (PRO-STAT SUGAR FREE 64) Liqd Take 30 mLs by mouth daily at 8 pm.   feeding supplement Liqd Take 1 Container by mouth 3 (three) times daily between meals.   fentaNYL 50 MCG/HR Commonly known as:  DURAGESIC - dosed mcg/hr Place 1 patch (50 mcg total) onto the skin every 3 (three) days.   GLUCAGEN 1 MG Solr injection Generic drug:  glucagon Inject 1 mg into the vein once as needed for low blood sugar.   HUMALOG 100 UNIT/ML injection Generic drug:  insulin lispro Inject 5 Units into the skin 3 (three) times daily after meals. For CBG>100 give additional 2 units for CBG>300   insulin glargine 100 UNIT/ML injection Commonly known as:  LANTUS Inject 10 Units into the skin at bedtime.   LACRI-LUBE OP Apply to eye. Apply to both eyes twice daily   levothyroxine 75 MCG tablet Commonly known as:  SYNTHROID, LEVOTHROID Take 75-112.5 mcg by mouth daily. Take 1 and 1/2 tablets on Saturdays then 1 tablet all other days   LORazepam 0.5 MG tablet Commonly known as:  ATIVAN Take 0.5 mg by mouth. Take one tablet every 2 hours as needed for agitation   losartan 25 MG tablet Commonly known as:  COZAAR Take 25 mg by mouth daily.   nitroGLYCERIN 0.4 MG SL tablet Commonly known as:  NITROSTAT Place 0.4 mg under the tongue every 5 (five) minutes as needed. For chest pain   polyvinyl alcohol 1.4 % ophthalmic solution Commonly known as:  LIQUIFILM TEARS Place 1 drop into both eyes 4 (four) times daily as needed for dry eyes.   propranolol 20 MG tablet Commonly known as:  INDERAL Take 20 mg by mouth 2 (two) times daily.   SANTYL ointment Generic drug:  collagenase Apply 1 application topically. Apply Santyl and dressing to  heel every day until healed   silver nitrate applicators [id number] % applicator Apply topically. Apply to bleeding spot on the buttocks as needed any time bleeding occures as directed   silver sulfADIAZINE 1 % cream Commonly known as:  SILVADENE Apply 1 application topically. Apply to buttocks twice daily   UNABLE TO FIND Med Name: Magic Cup with lunch and dinner       Review of Systems  Constitutional: Positive for activity change, appetite change and fatigue. Negative for chills, diaphoresis and fever.       C/o aches allover  HENT: Positive for hearing loss. Negative for congestion, ear discharge and ear pain.  Eyes: Negative for pain, discharge and redness.  Respiratory: Negative for cough, shortness of breath and wheezing.   Cardiovascular: Positive for leg swelling. Negative for chest pain and palpitations.  Gastrointestinal: Negative for abdominal pain, constipation, nausea and vomiting.  Endocrine: Negative for polydipsia.  Genitourinary: Positive for frequency. Negative for dysuria and urgency.       Recurrent urinary tract infections  Musculoskeletal: Positive for back pain. Negative for myalgias and neck pain.  Skin: Negative for rash.       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 R+L   Pressure ulcers at the R+L intergluteal cleft, non healing. New pressure ulcer right heel, unable to stage, black scab over the wound with the edge open and odorous drainage, fever and tenderness noted entire right heel.     Neurological: Negative for dizziness, tremors, seizures, weakness and headaches.  Psychiatric/Behavioral: Negative for hallucinations and suicidal ideas. The patient is nervous/anxious.        More confused    Immunization History  Administered Date(s) Administered  . Influenza Whole 09/04/2013  . Influenza-Unspecified 08/21/2014, 08/21/2015  . PPD Test 04/26/2010  . Pneumococcal-Unspecified 11/14/1998  . Td 11/14/2001   Pertinent  Health Maintenance Due  Topic Date  Due  . OPHTHALMOLOGY EXAM  10/15/1936  . DEXA SCAN  10/15/1993  . PNA vac Low Risk Adult (2 of 2 - PCV13) 11/15/1999  . FOOT EXAM  02/05/2016  . HEMOGLOBIN A1C  04/28/2016  . INFLUENZA VACCINE  06/14/2016   Fall Risk  06/02/2015 02/05/2015 11/04/2014  Falls in the past year? No No Yes  Number falls in past yr: - - 2 or more  Risk for fall due to : - Impaired balance/gait History of fall(s);Impaired balance/gait;Impaired mobility   Functional Status Survey:    Vitals:   06/14/16 1228  BP: 126/66  Pulse: 60  Resp: 20  Temp: (!) 96.5 F (35.8 C)  Weight: 180 lb (81.6 kg)  Height: 5\' 3"  (1.6 m)   Body mass index is 31.89 kg/m. Physical Exam  Constitutional: She appears well-developed.  HENT:  Head: Normocephalic and atraumatic.  Left Ear: External ear normal.  Eyes: Conjunctivae and EOM are normal. Pupils are equal, round, and reactive to light.  Neck: Neck supple. No JVD present. No thyromegaly present.  Cardiovascular: Normal rate and regular rhythm.   Murmur heard. Pulmonary/Chest: Effort normal. She has rales.  Bibasilar rales.   Abdominal: Soft. There is no tenderness. There is no rebound.  Musculoskeletal: She exhibits edema and tenderness.  Back pain is chronic which is well controlled. BLE chronic edema trace only. Left foot external deviated deformity.   Lymphadenopathy:    She has no cervical adenopathy.  Neurological: She is alert. No cranial nerve deficit. Coordination normal.  Skin: Skin is warm and dry. Rash noted.  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 R+L   Pressure ulcers at the R+L intergluteal cleft, non healing. New pressure ulcer right heel, unable to stage, black scab over the wound with the edge open and odorous drainage. Skin tears left arm   Psychiatric: Her mood appears anxious. Her affect is not blunt, not labile and not inappropriate. Her speech is rapid and/or pressured (occasionally ). Her speech is not delayed and not slurred. She is hyperactive.  She is not agitated, not aggressive, not slowed, not withdrawn, not actively hallucinating and not combative. Thought content is not paranoid and not delusional. She does not exhibit a depressed mood. She exhibits abnormal recent memory.  MMSE 21/30 08/2013  Labs reviewed:  Recent Labs  03/31/16 05/02/16 05/31/16  NA 133* 142 137  K 4.9 4.9 3.7  BUN 47* 37* 37*  CREATININE 1.3* 0.8 0.8    Recent Labs  01/28/16 03/31/16 05/02/16  AST 28 14 16   ALT 13 6* 7  ALKPHOS 119 111 114    Recent Labs  01/28/16 03/31/16 05/02/16  WBC 7.0 8.8 7.9  HGB 15.6 14.3 13.5  HCT 47* 42 41  PLT 277 321 312   Lab Results  Component Value Date   TSH 3.95 01/28/2016   Lab Results  Component Value Date   HGBA1C 8.1 10/29/2015   Lab Results  Component Value Date   CHOL  10/22/2010    100        ATP III CLASSIFICATION:  <200     mg/dL   Desirable  200-239  mg/dL   Borderline High  >=240    mg/dL   High          HDL 55 10/22/2010   LDLCALC  10/22/2010    29        Total Cholesterol/HDL:CHD Risk Coronary Heart Disease Risk Table                     Men   Women  1/2 Average Risk   3.4   3.3  Average Risk       5.0   4.4  2 X Average Risk   9.6   7.1  3 X Average Risk  23.4   11.0        Use the calculated Patient Ratio above and the CHD Risk Table to determine the patient's CHD Risk.        ATP III CLASSIFICATION (LDL):  <100     mg/dL   Optimal  100-129  mg/dL   Near or Above                    Optimal  130-159  mg/dL   Borderline  160-189  mg/dL   High  >190     mg/dL   Very High   TRIG 80 10/22/2010   CHOLHDL 1.8 10/22/2010    Significant Diagnostic Results in last 30 days:  No results found.  Assessment/Plan There are no diagnoses linked to this encounter.HTN (hypertension) Controlled, continue Losartan 25mg  daily, Propranolol 20mg  bid  A-fib 02/03/14 doing well, EKG A-fib with controlled VR.  Heart rate is in control, continue Propranolol.   Controlled type 2  DM with peripheral circulatory disorder (HCC) Dc Lantus 10u, CBG 60s at night, continue  Novolog with meals, additional 2u if CBG>300   Type 2 diabetes mellitus with diabetic neuropathy affecting both sides of body (HCC) Dc Lantus 10u, CBG 60s at night, continue  Novolog with meals, additional 2u if CBG>300    GERD (gastroesophageal reflux disease) Asymptomatic, continue Famotidine 20mg    Hypothyroidism Continue Levothyroxine 112.19mcg Sat, 65mcg M, T, W, Thr, F, and Sun, last TSH 3.95 01/28/16  Venous stasis dermatitis of both lower extremities Chronic but stable, currently no open wounds or s/s of infection.   FTT (failure to thrive) in adult Persists, continue comfort measures.   Back pain Chronic issue, pain is controlled, continue Fentanyl 73mcg/hr        Family/ staff Communication: SNF for care assistance.   Labs/tests ordered:  none

## 2016-06-15 NOTE — Assessment & Plan Note (Signed)
Persists, continue comfort measures.  

## 2016-06-15 NOTE — Assessment & Plan Note (Signed)
02/03/14 doing well, EKG A-fib with controlled VR.  Heart rate is in control, continue Propranolol.

## 2016-06-15 NOTE — Assessment & Plan Note (Signed)
Dc Lantus 10u, CBG 60s at night, continue  Novolog with meals, additional 2u if CBG>300

## 2016-06-15 NOTE — Assessment & Plan Note (Signed)
Continue Levothyroxine 112.47mcg Sat, 44mcg M, T, W, Thr, F, and Sun, last TSH 3.95 01/28/16

## 2016-06-15 NOTE — Assessment & Plan Note (Signed)
Controlled, continue Losartan 25mg  daily, Propranolol 20mg  bid

## 2016-06-15 NOTE — Assessment & Plan Note (Signed)
Asymptomatic, continue Famotidine 20mg 

## 2016-06-15 NOTE — Assessment & Plan Note (Signed)
Chronic issue, pain is controlled, continue Fentanyl 71mcg/hr

## 2016-06-15 NOTE — Assessment & Plan Note (Signed)
Chronic but stable, currently no open wounds or s/s of infection.

## 2016-07-15 DEATH — deceased
